# Patient Record
Sex: Female | Born: 1955 | Race: White | Hispanic: No | Marital: Married | Smoking: Former smoker
Health system: Southern US, Community
[De-identification: ages and names within clinical notes are randomized; demographics above are authoritative.]

## PROBLEM LIST (undated history)

## (undated) DIAGNOSIS — I2510 Atherosclerotic heart disease of native coronary artery without angina pectoris: Secondary | ICD-10-CM

## (undated) DIAGNOSIS — Z87442 Personal history of urinary calculi: Secondary | ICD-10-CM

## (undated) DIAGNOSIS — M1990 Unspecified osteoarthritis, unspecified site: Secondary | ICD-10-CM

## (undated) DIAGNOSIS — I6529 Occlusion and stenosis of unspecified carotid artery: Secondary | ICD-10-CM

## (undated) DIAGNOSIS — IMO0001 Reserved for inherently not codable concepts without codable children: Secondary | ICD-10-CM

## (undated) DIAGNOSIS — G8929 Other chronic pain: Secondary | ICD-10-CM

## (undated) DIAGNOSIS — F32A Depression, unspecified: Secondary | ICD-10-CM

## (undated) DIAGNOSIS — I10 Essential (primary) hypertension: Secondary | ICD-10-CM

## (undated) DIAGNOSIS — E785 Hyperlipidemia, unspecified: Secondary | ICD-10-CM

## (undated) DIAGNOSIS — M545 Low back pain, unspecified: Secondary | ICD-10-CM

## (undated) DIAGNOSIS — E1165 Type 2 diabetes mellitus with hyperglycemia: Secondary | ICD-10-CM

## (undated) DIAGNOSIS — I499 Cardiac arrhythmia, unspecified: Secondary | ICD-10-CM

## (undated) DIAGNOSIS — E1159 Type 2 diabetes mellitus with other circulatory complications: Secondary | ICD-10-CM

## (undated) DIAGNOSIS — G43109 Migraine with aura, not intractable, without status migrainosus: Secondary | ICD-10-CM

## (undated) DIAGNOSIS — IMO0002 Reserved for concepts with insufficient information to code with codable children: Secondary | ICD-10-CM

## (undated) DIAGNOSIS — G629 Polyneuropathy, unspecified: Secondary | ICD-10-CM

## (undated) DIAGNOSIS — F419 Anxiety disorder, unspecified: Secondary | ICD-10-CM

## (undated) DIAGNOSIS — I219 Acute myocardial infarction, unspecified: Secondary | ICD-10-CM

## (undated) DIAGNOSIS — Z794 Long term (current) use of insulin: Secondary | ICD-10-CM

## (undated) DIAGNOSIS — Z531 Procedure and treatment not carried out because of patient's decision for reasons of belief and group pressure: Secondary | ICD-10-CM

## (undated) DIAGNOSIS — F329 Major depressive disorder, single episode, unspecified: Secondary | ICD-10-CM

## (undated) DIAGNOSIS — J45909 Unspecified asthma, uncomplicated: Secondary | ICD-10-CM

## (undated) DIAGNOSIS — Z9289 Personal history of other medical treatment: Secondary | ICD-10-CM

## (undated) DIAGNOSIS — I639 Cerebral infarction, unspecified: Secondary | ICD-10-CM

## (undated) DIAGNOSIS — H269 Unspecified cataract: Secondary | ICD-10-CM

## (undated) HISTORY — PX: CARPAL TUNNEL RELEASE: SHX101

## (undated) HISTORY — DX: Cerebral infarction, unspecified: I63.9

## (undated) HISTORY — PX: KIDNEY STONE SURGERY: SHX686

## (undated) HISTORY — DX: Reserved for concepts with insufficient information to code with codable children: IMO0002

## (undated) HISTORY — PX: LITHOTRIPSY: SUR834

## (undated) HISTORY — DX: Occlusion and stenosis of unspecified carotid artery: I65.29

## (undated) HISTORY — PX: CAROTID ENDARTERECTOMY: SUR193

## (undated) HISTORY — DX: Personal history of urinary calculi: Z87.442

## (undated) HISTORY — DX: Type 2 diabetes mellitus with other circulatory complications: E11.59

## (undated) HISTORY — DX: Hyperlipidemia, unspecified: E78.5

## (undated) HISTORY — DX: Type 2 diabetes mellitus with hyperglycemia: E11.65

## (undated) HISTORY — DX: Migraine with aura, not intractable, without status migrainosus: G43.109

## (undated) HISTORY — DX: Long term (current) use of insulin: Z79.4

## (undated) HISTORY — DX: Personal history of other medical treatment: Z92.89

---

## 2000-06-25 HISTORY — PX: US ECHOCARDIOGRAPHY: HXRAD669

## 2000-06-25 HISTORY — PX: CORONARY ANGIOPLASTY WITH STENT PLACEMENT: SHX49

## 2002-06-25 HISTORY — PX: CARDIAC CATHETERIZATION: SHX172

## 2003-06-26 HISTORY — PX: ANKLE SURGERY: SHX546

## 2007-11-22 ENCOUNTER — Other Ambulatory Visit: Payer: Self-pay

## 2007-11-23 ENCOUNTER — Inpatient Hospital Stay: Payer: Self-pay | Admitting: Internal Medicine

## 2007-12-03 ENCOUNTER — Ambulatory Visit: Payer: Self-pay | Admitting: Urology

## 2007-12-05 ENCOUNTER — Ambulatory Visit: Payer: Self-pay | Admitting: Urology

## 2008-06-02 ENCOUNTER — Emergency Department: Payer: Self-pay | Admitting: Emergency Medicine

## 2008-06-04 ENCOUNTER — Ambulatory Visit: Payer: Self-pay | Admitting: Internal Medicine

## 2008-06-04 ENCOUNTER — Inpatient Hospital Stay (HOSPITAL_COMMUNITY): Admission: EM | Admit: 2008-06-04 | Discharge: 2008-06-06 | Payer: Self-pay | Admitting: Emergency Medicine

## 2010-06-25 HISTORY — PX: CATARACT EXTRACTION W/ INTRAOCULAR LENS IMPLANT: SHX1309

## 2010-11-07 NOTE — Discharge Summary (Signed)
[name], [name]              ACCOUNT NO.:  [id number]   MEDICAL RECORD NO.:  [id number]          PATIENT TYPE:  INP   LOCATION:  4710                         FACILITY:  [facility]   PHYSICIAN:  Altha Harm, MDDATE OF BIRTH:  07/19/1955   DATE OF ADMISSION:  06/04/2008  DATE OF DISCHARGE:  06/06/2008                               DISCHARGE SUMMARY   DISCHARGE DISPOSITION:  Home.   FINAL DISCHARGE DIAGNOSES:  1. Acute tracheobronchitis.  2. Urinary tract infection.  3. Pharyngitis.  4. Hyperlipidemia.  5. Diabetes type 2, uncontrolled.  6. Hypertension.  7. Chest pain, likely noncardiac.   DISCHARGE MEDICATIONS:  1. Lantus 45 units subcutaneous nightly.  2. Lisinopril 20 mg p.o. daily.  3. NovoLog sliding scale.  4. Mucinex over-the-counter 1 tab q.8 hours p.r.n.  5. Aspirin 325 mg p.o. daily.  6. Metoprolol 12.5 mg p.o. b.i.d.  7. Pravastatin 20 mg p.o. nightly.  8. Ciprofloxacin 500 mg p.o. b.i.d. x4 days.  9. Albuterol MDI 2 puffs p.o. q.2 hours p.r.n.   CONSULTANTS:  None.   PROCEDURES:  None.   DIAGNOSTIC STUDIES:  A 2-view chest x-ray done on admission which shows  no acute cardiopulmonary process.   PERTINENT LABORATORY STUDIES:  1. Rapid strep screen, negative.  2. Urinalysis shows elements consistent with a UTI.  No cultures      since.   CODE STATUS:  Full Code.   ALLERGIES:  No known drug allergies.   PRIMARY CARE PHYSICIAN:  Unassigned in this area.  The patient is from  [place].   CHIEF COMPLAINT:  Shortness of breath.   HISTORY OF PRESENT ILLNESS:  Please refer to the H and P by Dr. Sharon Seller  for details of the HPI.   HOSPITAL COURSE:  The patient is admitted with complaints of shortness  of breath.  She did not show any respiratory process on the chest x-ray.  Cardiology was consulted for shortness of breath and chest pain workup.  They did not feel that this was cardiac in nature.  The patient,  throughout her hospital course,  revealed symptoms of an acute  tracheobronchitis and was started on albuterol inhalers.  The patient  improved during her hospitalization and had no further shortness of  breath and no oxygen requirement.  In the course of her workup, the  patient was found to have a urinary tract infection.  She was started on  IV Rocephin in the hospital.  However, cultures were not sent on the  patient to guide the antibiotic choice.  The patient received Rocephin  x2 days and is being sent home on ciprofloxacin for an additionally 4  days to treat both the tracheobronchitis as well as the urinary tract  infection.  The patient has been afebrile throughout her hospital stay.  In terms of her chest pain and risk factor analysis, the patient was  found to have a markedly elevated cholesterol with her total cholesterol  being 283 and an LDL of 217.  The patient started on pravastatin.  In  terms of her diabetes, the patient had a hemoglobin A1c of 12.3,  which  speaks to very poor control as an outpatient.  The patient has not been  following up on a regular basis for this, and I have encouraged the  patient to get back in to her regular scheduled 3 times per year,  following up with her primary care physician for this.  The patient  resumed on her Lantus and NovoLog at doses specified above.   In terms of her chest pain, although the cardiologist felt it was  noncardiac, they felt that further outpatient evaluation was warranted  and recommended the patient stay on a beta blocker as well as on aspirin  until she has been further evaluated by them.  The patient is being sent  out on metoprolol and aspirin.  The patient's condition today is stable.   PHYSICAL EXAM:  VITAL SIGNS:  Temperature 98.3, heart rate 67, blood  pressure 130/84, respiratory rate 40, O2 saturation 98% on room air.  CBG running 186-260.  LUNGS:  Clear to auscultation.  CARDIOVASCULAR:  She has normal S1, S2.  No murmurs, rubs, or  gallops  noted.  ABDOMEN:  Obese, soft, nontender, nondistended.  No masses or  hepatosplenomegaly.  EXTREMITIES:  No clubbing, erythema, or cyanosis.  GENERAL:  The patient is nontoxic and well-appearing.   FOLLOWUP:  The patient is to follow up with her primary care physician  within 1 week.   DIETARY RESTRICTIONS:  The patient should be on low-cholesterol, low-  sodium diabetic diet.   ACTIVITY RESTRICTIONS:  None.   Total time 1hr.      Altha Harm, MD  Electronically Signed     MAM/MEDQ  D:  06/06/2008  T:  06/06/2008  Job:  [id number]

## 2010-11-07 NOTE — H&P (Signed)
[name], [name] NO.:  [id number]   MEDICAL RECORD NO.:  [id number]          PATIENT TYPE:  INP   LOCATION:  4710                         FACILITY:  [facility]   PHYSICIAN:  Pricilla Riffle, MD, FACCDATE OF BIRTH:  Mar 19, 1956   DATE OF ADMISSION:  06/04/2008  DATE OF DISCHARGE:                              HISTORY & PHYSICAL   PRIMARY CARDIOLOGIST:  Pricilla Riffle, MD, [facility]   PRIMARY CARE PHYSICIAN:  At this time, the patient was previously  followed by [facility] at [facility] in [place], [place].  She  cannot recall the name of the cardiologist.  [name] is a 55 year old  Caucasian female with a reported history of coronary artery disease  status post PCI with placement of a stent in 2002 or 2003.  The patient  is unable to recall what type of stent or where it was placed.  She  states that it was put in a artery on her heart.  Catheterization was  in the setting of abnormal stress test which was a routine stress test  ordered by her primary care physician because of her risk factors for  heart disease.  [name] states she did not have any associated  symptoms prior to the stress test.  [name] also report she had a  followup stress test after her stent was placed that she told was  negative.  She has never followed up with Cardiology since that time.  It has been at least 6 years ago.  She moved here from [place], [place], 2  years ago.  Since being here 2 years ago, she has not followed up with  or established care with a cardiologist or primary care physician.  Ms.  [name] also has a history of dyslipidemia, hypertension, and diabetes.  She reports 6 months ago, she was treated at [facility] for kidney stones and was told that she had a possible tumor on  her kidney that she needed to have a repeat CT scan or workup in 6  months.  She states she has not done this yet either.  She presents to  [facility] Emergency Room today  complaining of fatigue.  Fatigue is so  intense that it is not relieved with rest.  Today, she states she became  so dyspneic and wheezing and coughing that she felt like she could not  take another breath.  In the setting of extreme fatigue, decided she  better get checked and had her husband drive her here to the emergency  room.  She complains of fatigue for months, not relieved with rest.  She  states that she has been in bed for the last week because she has had a  head cold and a chest cold, night sweats, and a nonproductive cough.  She complains of fleeting sharp sensation in her chest at times, not  associated with any of the other symptoms.  When she arrived in the  emergency room, she was immediately given a neb treatment and 4 baby  aspirins.  She states her breathing was much improved  with the nebulizer  treatment.  Now her only complaint is of ongoing fatigue and a  nonproductive cough.  At home, she states compliance with her medicines.  When I asked her how she is obtaining her medicines if she has not been  to a doctor in 2 years, she states she drove back home to [place] 6  months ago to see her family and saw her previous endocrinologist while  she was there, and she states her endocrinologist gave her a new  prescriptions with refills, instructed her to establish care here.  She  states that her dyslipidemia, hypertension, and diabetes are controlled  by her diet, but she weaned herself off her diabetes medications and her  statins.  Her CBGs at home, she reports run between 66 and 120, but for  the last week, she has not been eating, so she has not been taking her  insulin and they have been running in the 200s-300s.   PAST MEDICAL HISTORY:  1. Reported coronary artery disease status post PCI with a      questionable stent, unknown location, unknown type of stent.  This      was reportedly put in 2002 or 2003 in [place], [place].  Under this,      followup stress test per  patient's report was negative.  2. Dyslipidemia:  The patient reports diet controlled, although she      has not been tested.  3. Hypertension:  The patient states compliance with her lisinopril.  4. Medical noncompliance.  5. Diabetes:  The patient states this is diet controlled, although she      states she does take her Lantus and NovoLog.  6. Questionable kidney stone, questionable abnormal finding, a      questionable tumor per patient's report 6 months ago at [facility].   SOCIAL HISTORY:  The patient lives in [place] with her husband.  She is not  employed, and her husband has been laid off.  She has an adult daughter  and several grandchildren.  She denies any tobacco, EtOH, drug, or  herbal medication use.  She tries to follow an ADA heart-healthy diet.  For exercise, she works with horses and walks.   FAMILY HISTORY:  Positive for angina in the mother, father is alive  with multiple medical problems, followed by the [place].  The patient states  he will not elaborate on his medical history.  Siblings, she has a  sister who apparently is a Engineer, civil (consulting) and has some type of cardiac valve  problem and will eventually need surgery, although the patient is not  sure what her diagnosis is.   REVIEW OF SYSTEMS:  Positive for night sweats, chills, headache, sore  throat, shortness of breath, dyspnea on exertion, cough, wheezing,  fleeting sharp chest pain, a band-like sensation around chest,  generalized weakness, aching all over, and GERD symptoms.  All other  systems reviewed and negative.   ALLERGIES:  No known drug allergies.   Current medications include Lantus 45 units nightly, lisinopril 40,  NovoLog sliding scale insulin a.c., Mucinex OTC, and Alka-Seltzer.   PHYSICAL EXAMINATION:  VITAL SIGNS:  Temperature is 98.3, pulse 82,  respirations 18, blood pressure 177/83 and 165/107.  GENERAL:  The patient is generally in no acute distress.  HEENT:   Normocephalic, atraumatic.  Pupils equal, round, and reactive to  light.  NECK:  Supple without lymphadenopathy.  No bruits.  No JVD.  CARDIOVASCULAR:  S1 and S2.  Regular rate and rhythm.  LUNGS:  Currently clear.  Initially, the patient had scattered rhonchi,  improved with neb treatment.  SKIN:  Warm and dry.  ABDOMEN:  Soft, nontender, positive bowel sounds.  Supple.  LOWER EXTREMITIES:  Without clubbing, cyanosis, or edema.  NEUROLOGIC:  Alert and oriented x3 with a flat affect.   Chest x-ray showed no acute findings.  EKG, sinus rhythm at a rate of 69  without acute ST- or T-wave changes.   LABORATORY WORK:  The only lab work back is her chemistry showing a  sodium of 138, potassium 3.1, chloride 101, CO2 21, BUN 21, creatinine  1.3, a glucose of 267.   IMPRESSION:  1. Dyspnea, body aches with nonproductive cough, symptoms relieved      with nebulizer treatment and aspirin here in the emergency room.  2. Reported history of coronary artery disease with a previous      percutaneous coronary intervention, records not available at this      time.  EKG showing no acute findings.  Cardiac enzymes, first point      of care is negative.  3. Diabetes, poorly controlled per glucose level 267.  4. Hypertension, poorly controlled.  5. Dyslipidemia, status unknown.   Dr. Dietrich Pates has been into examine and assessed the patient.   CURRENT SUBJECTIVE AND OBJECTIVE FINDINGS:  Negative for acute cardiac  issues, most likely respiratory/viral.  We will follow along.  Check  cardiac enzymes and D-dimer.  The patient will need cardiac followup as  an outpatient.  This can be established at the time of discharge.  We  will go ahead and order some fasting lipids, hemoglobin A1c, and cardiac  markers on the patient, as well as supplement her potassium of 3.1.  The  patient will need education in regards to her poorly controlled cardiac  risk factors.  Also, we will add a beta-blocker for her  improved blood  pressure control in the setting of known coronary artery disease.      Dorian Pod, ACNP      Pricilla Riffle, MD, [facility]  Electronically Signed    MB/MEDQ  D:  06/04/2008  T:  06/05/2008  Job:  [id number]

## 2011-03-30 LAB — POCT CARDIAC MARKERS
Myoglobin, poc: 112 ng/mL (ref 12–200)
Myoglobin, poc: 168 ng/mL (ref 12–200)
Troponin i, poc: <0.05 ng/mL (ref 0.00–0.09)

## 2011-03-30 LAB — COMPREHENSIVE METABOLIC PANEL
ALT: 15 U/L (ref 0–35)
ALT: 16 U/L (ref 0–35)
AST: 21 U/L (ref 0–37)
Albumin: 3.2 g/dL — ABNORMAL LOW (ref 3.5–5.2)
Alkaline Phosphatase: 101 U/L (ref 39–117)
BUN: 14 mg/dL (ref 6–23)
CO2: 27 mEq/L (ref 19–32)
Chloride: 95 mEq/L — ABNORMAL LOW (ref 96–112)
Chloride: 97 mEq/L (ref 96–112)
Creatinine, Ser: 1.12 mg/dL (ref 0.4–1.2)
GFR calc Af Amer: >60 mL/min (ref >60)
GFR calc non Af Amer: 51 mL/min — ABNORMAL LOW (ref >60)
Glucose, Bld: 234 mg/dL — ABNORMAL HIGH (ref 70–99)
Potassium: 3.7 mEq/L (ref 3.5–5.1)
Total Bilirubin: 0.7 mg/dL (ref 0.3–1.2)
Total Bilirubin: 0.9 mg/dL (ref 0.3–1.2)

## 2011-03-30 LAB — BLOOD GAS, ARTERIAL
Bicarbonate: 24.5 mEq/L — ABNORMAL HIGH (ref 20.0–24.0)
FIO2: 0.21 %
O2 Saturation: 93.2 %
Patient temperature: 98.4

## 2011-03-30 LAB — CBC
HCT: 36.7 % (ref 36.0–46.0)
Hemoglobin: 12.5 g/dL (ref 12.0–15.0)
Platelets: 425 10*3/uL — ABNORMAL HIGH (ref 150–400)
RBC: 4.76 MIL/uL (ref 3.87–5.11)
WBC: 11.2 10*3/uL — ABNORMAL HIGH (ref 4.0–10.5)
WBC: 11.9 10*3/uL — ABNORMAL HIGH (ref 4.0–10.5)

## 2011-03-30 LAB — URINALYSIS, ROUTINE W REFLEX MICROSCOPIC
Bilirubin Urine: NEGATIVE
Hgb urine dipstick: NEGATIVE
Ketones, ur: >80 mg/dL — AB
Protein, ur: 30 mg/dL — AB
Urobilinogen, UA: 1 mg/dL (ref 0.0–1.0)

## 2011-03-30 LAB — DIFFERENTIAL
Basophils Absolute: 0 10*3/uL (ref 0.0–0.1)
Basophils Relative: 0 % (ref 0–1)
Eosinophils Absolute: 0.2 10*3/uL (ref 0.0–0.7)
Eosinophils Relative: 2 % (ref 0–5)
Monocytes Absolute: 0.8 10*3/uL (ref 0.1–1.0)
Neutrophils Relative %: 66 % (ref 43–77)

## 2011-03-30 LAB — RAPID STREP SCREEN (MED CTR MEBANE ONLY): Streptococcus, Group A Screen (Direct): NEGATIVE

## 2011-03-30 LAB — CARDIAC PANEL(CRET KIN+CKTOT+MB+TROPI)
CK, MB: 1.4 ng/mL (ref 0.3–4.0)
Relative Index: INVALID (ref 0.0–2.5)
Relative Index: INVALID (ref 0.0–2.5)
Troponin I: 0.06 ng/mL (ref 0.00–0.06)
Troponin I: <0.01 ng/mL (ref 0.00–0.06)

## 2011-03-30 LAB — MAGNESIUM: Magnesium: 1.7 mg/dL (ref 1.5–2.5)

## 2011-03-30 LAB — HEMOGLOBIN A1C: Hgb A1c MFr Bld: 12.3 % — ABNORMAL HIGH (ref 4.6–6.1)

## 2011-03-30 LAB — PHOSPHORUS: Phosphorus: 2.1 mg/dL — ABNORMAL LOW (ref 2.3–4.6)

## 2011-03-30 LAB — TSH: TSH: 2.377 u[IU]/mL (ref 0.350–4.500)

## 2011-03-30 LAB — B-NATRIURETIC PEPTIDE (CONVERTED LAB): Pro B Natriuretic peptide (BNP): 109 pg/mL — ABNORMAL HIGH (ref 0.0–100.0)

## 2011-03-30 LAB — POCT I-STAT, CHEM 8
BUN: 21 mg/dL (ref 6–23)
Calcium, Ion: 1.02 mmol/L — ABNORMAL LOW (ref 1.12–1.32)
HCT: 40 % (ref 36.0–46.0)
Hemoglobin: 13.6 g/dL (ref 12.0–15.0)
Sodium: 138 mEq/L (ref 135–145)
TCO2: 30 mmol/L (ref 0–100)

## 2011-03-30 LAB — GLUCOSE, CAPILLARY
Glucose-Capillary: 164 mg/dL — ABNORMAL HIGH (ref 70–99)
Glucose-Capillary: 186 mg/dL — ABNORMAL HIGH (ref 70–99)
Glucose-Capillary: 216 mg/dL — ABNORMAL HIGH (ref 70–99)
Glucose-Capillary: 242 mg/dL — ABNORMAL HIGH (ref 70–99)

## 2011-03-30 LAB — VITAMIN B12: Vitamin B-12: 1958 pg/mL — ABNORMAL HIGH (ref 211–911)

## 2011-03-30 LAB — AMMONIA: Ammonia: <8 umol/L — ABNORMAL LOW (ref 11–35)

## 2011-03-30 LAB — CORTISOL-AM, BLOOD: Cortisol - AM: 13 ug/dL (ref 4.3–22.4)

## 2011-06-26 HISTORY — PX: OTHER SURGICAL HISTORY: SHX169

## 2011-12-12 ENCOUNTER — Inpatient Hospital Stay (HOSPITAL_COMMUNITY)
Admission: EM | Admit: 2011-12-12 | Discharge: 2011-12-14 | DRG: 065 | Disposition: A | Discharge status: Home / Self Care | Payer: MEDICAID | Attending: Internal Medicine | Admitting: Internal Medicine

## 2011-12-12 ENCOUNTER — Ambulatory Visit (HOSPITAL_COMMUNITY): Payer: Self-pay

## 2011-12-12 ENCOUNTER — Inpatient Hospital Stay (HOSPITAL_COMMUNITY): Payer: Self-pay

## 2011-12-12 ENCOUNTER — Encounter (HOSPITAL_COMMUNITY): Payer: Self-pay | Admitting: Emergency Medicine

## 2011-12-12 ENCOUNTER — Emergency Department (HOSPITAL_COMMUNITY): Payer: Self-pay

## 2011-12-12 DIAGNOSIS — Z9119 Patient's noncompliance with other medical treatment and regimen: Secondary | ICD-10-CM

## 2011-12-12 DIAGNOSIS — Z794 Long term (current) use of insulin: Secondary | ICD-10-CM

## 2011-12-12 DIAGNOSIS — R4182 Altered mental status, unspecified: Secondary | ICD-10-CM

## 2011-12-12 DIAGNOSIS — Z91199 Patient's noncompliance with other medical treatment and regimen due to unspecified reason: Secondary | ICD-10-CM

## 2011-12-12 DIAGNOSIS — I10 Essential (primary) hypertension: Secondary | ICD-10-CM

## 2011-12-12 DIAGNOSIS — I6529 Occlusion and stenosis of unspecified carotid artery: Secondary | ICD-10-CM | POA: Diagnosis present

## 2011-12-12 DIAGNOSIS — G454 Transient global amnesia: Secondary | ICD-10-CM

## 2011-12-12 DIAGNOSIS — F411 Generalized anxiety disorder: Secondary | ICD-10-CM | POA: Diagnosis present

## 2011-12-12 DIAGNOSIS — I6340 Cerebral infarction due to embolism of unspecified cerebral artery: Secondary | ICD-10-CM

## 2011-12-12 DIAGNOSIS — D649 Anemia, unspecified: Secondary | ICD-10-CM | POA: Diagnosis present

## 2011-12-12 DIAGNOSIS — I2510 Atherosclerotic heart disease of native coronary artery without angina pectoris: Secondary | ICD-10-CM

## 2011-12-12 DIAGNOSIS — I639 Cerebral infarction, unspecified: Secondary | ICD-10-CM | POA: Diagnosis present

## 2011-12-12 DIAGNOSIS — IMO0001 Reserved for inherently not codable concepts without codable children: Secondary | ICD-10-CM | POA: Diagnosis present

## 2011-12-12 DIAGNOSIS — I6350 Cerebral infarction due to unspecified occlusion or stenosis of unspecified cerebral artery: Principal | ICD-10-CM | POA: Diagnosis present

## 2011-12-12 DIAGNOSIS — E785 Hyperlipidemia, unspecified: Secondary | ICD-10-CM | POA: Diagnosis present

## 2011-12-12 DIAGNOSIS — I674 Hypertensive encephalopathy: Secondary | ICD-10-CM

## 2011-12-12 DIAGNOSIS — Z880 Allergy status to penicillin: Secondary | ICD-10-CM

## 2011-12-12 DIAGNOSIS — E1165 Type 2 diabetes mellitus with hyperglycemia: Secondary | ICD-10-CM

## 2011-12-12 DIAGNOSIS — Z87891 Personal history of nicotine dependence: Secondary | ICD-10-CM

## 2011-12-12 DIAGNOSIS — I658 Occlusion and stenosis of other precerebral arteries: Secondary | ICD-10-CM | POA: Diagnosis present

## 2011-12-12 DIAGNOSIS — F40298 Other specified phobia: Secondary | ICD-10-CM | POA: Diagnosis present

## 2011-12-12 DIAGNOSIS — I6789 Other cerebrovascular disease: Secondary | ICD-10-CM

## 2011-12-12 DIAGNOSIS — E119 Type 2 diabetes mellitus without complications: Secondary | ICD-10-CM

## 2011-12-12 HISTORY — DX: Anxiety disorder, unspecified: F41.9

## 2011-12-12 HISTORY — DX: Atherosclerotic heart disease of native coronary artery without angina pectoris: I25.10

## 2011-12-12 HISTORY — DX: Reserved for inherently not codable concepts without codable children: IMO0001

## 2011-12-12 HISTORY — DX: Other chronic pain: G89.29

## 2011-12-12 HISTORY — DX: Procedure and treatment not carried out because of patient's decision for reasons of belief and group pressure: Z53.1

## 2011-12-12 HISTORY — DX: Essential (primary) hypertension: I10

## 2011-12-12 HISTORY — DX: Low back pain, unspecified: M54.50

## 2011-12-12 HISTORY — DX: Low back pain: M54.5

## 2011-12-12 HISTORY — DX: Unspecified osteoarthritis, unspecified site: M19.90

## 2011-12-12 HISTORY — DX: Unspecified asthma, uncomplicated: J45.909

## 2011-12-12 LAB — DIFFERENTIAL
Basophils Relative: 0 % (ref 0–1)
Eosinophils Absolute: 0.1 10*3/uL (ref 0.0–0.7)
Eosinophils Relative: 2 % (ref 0–5)
Lymphs Abs: 2 10*3/uL (ref 0.7–4.0)
Monocytes Relative: 6 % (ref 3–12)

## 2011-12-12 LAB — URINALYSIS, ROUTINE W REFLEX MICROSCOPIC
Ketones, ur: NEGATIVE mg/dL
Leukocytes, UA: NEGATIVE
Nitrite: NEGATIVE
Protein, ur: 30 mg/dL — AB
Urobilinogen, UA: 0.2 mg/dL (ref 0.0–1.0)
pH: 7 (ref 5.0–8.0)

## 2011-12-12 LAB — CBC
HCT: 33.8 % — ABNORMAL LOW (ref 36.0–46.0)
MCH: 28.4 pg (ref 26.0–34.0)
MCH: 28.2 pg (ref 26.0–34.0)
MCHC: 34.4 g/dL (ref 30.0–36.0)
MCHC: 34.3 g/dL (ref 30.0–36.0)
MCV: 82.7 fL (ref 78.0–100.0)
MCV: 82.2 fL (ref 78.0–100.0)
Platelets: 403 10*3/uL — ABNORMAL HIGH (ref 150–400)
Platelets: 369 10*3/uL (ref 150–400)
RBC: 4.33 MIL/uL (ref 3.87–5.11)
RDW: 12.3 % (ref 11.5–15.5)

## 2011-12-12 LAB — URINE MICROSCOPIC-ADD ON

## 2011-12-12 LAB — GLUCOSE, CAPILLARY
Glucose-Capillary: 270 mg/dL — ABNORMAL HIGH (ref 70–99)
Glucose-Capillary: 286 mg/dL — ABNORMAL HIGH (ref 70–99)

## 2011-12-12 LAB — POCT I-STAT, CHEM 8
Calcium, Ion: 1.22 mmol/L (ref 1.12–1.32)
HCT: 40 % (ref 36.0–46.0)
Hemoglobin: 13.6 g/dL (ref 12.0–15.0)
TCO2: 25 mmol/L (ref 0–100)

## 2011-12-12 LAB — RAPID URINE DRUG SCREEN, HOSP PERFORMED
Amphetamines: NOT DETECTED
Tetrahydrocannabinol: NOT DETECTED

## 2011-12-12 MED ORDER — LORAZEPAM 2 MG/ML IJ SOLN
INTRAMUSCULAR | Status: AC
  Administered 2011-12-12: 1 mg via INTRAVENOUS
  Filled 2011-12-12: qty 1

## 2011-12-12 MED ORDER — LORAZEPAM 2 MG/ML IJ SOLN
1.0000 mg | Freq: Once | INTRAMUSCULAR | Status: AC
  Administered 2011-12-12: 1 mg via INTRAVENOUS

## 2011-12-12 MED ORDER — ONDANSETRON HCL 4 MG/2ML IJ SOLN: 4.0000 mg | Freq: Four times a day (QID) | INTRAMUSCULAR | Status: DC | PRN

## 2011-12-12 MED ORDER — LABETALOL HCL 5 MG/ML IV SOLN
20.0000 mg | Freq: Once | INTRAVENOUS | Status: AC
  Administered 2011-12-12: 20 mg via INTRAVENOUS
  Filled 2011-12-12: qty 4

## 2011-12-12 MED ORDER — HYDROCHLOROTHIAZIDE 12.5 MG PO CAPS
12.5000 mg | ORAL_CAPSULE | Freq: Every day | ORAL | Status: DC
  Administered 2011-12-12: 12.5 mg via ORAL
  Filled 2011-12-12: qty 1

## 2011-12-12 MED ORDER — LISINOPRIL 5 MG PO TABS
5.0000 mg | ORAL_TABLET | Freq: Every day | ORAL | Status: DC
  Administered 2011-12-12 – 2011-12-14 (×3): 5 mg via ORAL
  Filled 2011-12-12 (×3): qty 1

## 2011-12-12 MED ORDER — SODIUM CHLORIDE 0.9 % IJ SOLN
3.0000 mL | Freq: Two times a day (BID) | INTRAMUSCULAR | Status: DC
  Administered 2011-12-12 – 2011-12-14 (×4): 3 mL via INTRAVENOUS

## 2011-12-12 MED ORDER — ENOXAPARIN SODIUM 40 MG/0.4ML ~~LOC~~ SOLN
40.0000 mg | SUBCUTANEOUS | Status: DC
  Administered 2011-12-12 – 2011-12-14 (×3): 40 mg via SUBCUTANEOUS
  Filled 2011-12-12 (×3): qty 0.4

## 2011-12-12 MED ORDER — ACETAMINOPHEN 325 MG PO TABS
650.0000 mg | ORAL_TABLET | Freq: Once | ORAL | Status: AC
  Administered 2011-12-12: 650 mg via ORAL

## 2011-12-12 MED ORDER — ACETAMINOPHEN 650 MG RE SUPP: 650.0000 mg | Freq: Four times a day (QID) | RECTAL | Status: DC | PRN

## 2011-12-12 MED ORDER — LABETALOL HCL 5 MG/ML IV SOLN: 10.0000 mg | INTRAVENOUS | Status: DC | PRN

## 2011-12-12 MED ORDER — INSULIN ASPART 100 UNIT/ML ~~LOC~~ SOLN
0.0000 [IU] | Freq: Three times a day (TID) | SUBCUTANEOUS | Status: DC
  Administered 2011-12-12: 5 [IU] via SUBCUTANEOUS
  Administered 2011-12-13: 7 [IU] via SUBCUTANEOUS
  Administered 2011-12-13: 5 [IU] via SUBCUTANEOUS

## 2011-12-12 MED ORDER — SODIUM CHLORIDE 0.9 % IJ SOLN: 3.0000 mL | INTRAMUSCULAR | Status: DC | PRN

## 2011-12-12 MED ORDER — SODIUM CHLORIDE 0.9 % IJ SOLN
3.0000 mL | Freq: Two times a day (BID) | INTRAMUSCULAR | Status: DC
  Administered 2011-12-13: 3 mL via INTRAVENOUS

## 2011-12-12 MED ORDER — SODIUM CHLORIDE 0.9 % IV SOLN: 250.0000 mL | INTRAVENOUS | Status: DC | PRN

## 2011-12-12 MED ORDER — ONDANSETRON HCL 4 MG PO TABS: 4.0000 mg | ORAL_TABLET | Freq: Four times a day (QID) | ORAL | Status: DC | PRN

## 2011-12-12 MED ORDER — SIMVASTATIN 10 MG PO TABS
10.0000 mg | ORAL_TABLET | Freq: Every day | ORAL | Status: DC
  Administered 2011-12-12 – 2011-12-14 (×3): 10 mg via ORAL
  Filled 2011-12-12 (×3): qty 1

## 2011-12-12 MED ORDER — SENNA 8.6 MG PO TABS
1.0000 | ORAL_TABLET | Freq: Two times a day (BID) | ORAL | Status: DC
  Administered 2011-12-12 – 2011-12-13 (×3): 8.6 mg via ORAL
  Filled 2011-12-12 (×5): qty 1

## 2011-12-12 MED ORDER — ACETAMINOPHEN 325 MG PO TABS
650.0000 mg | ORAL_TABLET | Freq: Four times a day (QID) | ORAL | Status: DC | PRN
  Administered 2011-12-13: 650 mg via ORAL
  Filled 2011-12-12: qty 2

## 2011-12-12 MED ORDER — ACETAMINOPHEN 325 MG PO TABS
ORAL_TABLET | ORAL | Status: AC
  Administered 2011-12-12: 650 mg via ORAL
  Filled 2011-12-12: qty 2

## 2011-12-12 MED ORDER — HYDROCODONE-ACETAMINOPHEN 5-325 MG PO TABS: 1.0000 | ORAL_TABLET | ORAL | Status: DC | PRN

## 2011-12-12 MED ORDER — LABETALOL HCL 5 MG/ML IV SOLN
2.0000 mg/min | INTRAVENOUS | Status: DC
  Administered 2011-12-12: 1 mg/min via INTRAVENOUS
  Filled 2011-12-12: qty 100

## 2011-12-12 MED ORDER — ASPIRIN 325 MG PO TABS
325.0000 mg | ORAL_TABLET | Freq: Every day | ORAL | Status: DC
  Administered 2011-12-12 – 2011-12-14 (×3): 325 mg via ORAL
  Filled 2011-12-12 (×3): qty 1

## 2011-12-12 MED ORDER — GADOBENATE DIMEGLUMINE 529 MG/ML IV SOLN
15.0000 mL | Freq: Once | INTRAVENOUS | Status: AC | PRN
  Administered 2011-12-12: 15 mL via INTRAVENOUS

## 2011-12-12 MED ORDER — SODIUM CHLORIDE 0.9 % IV SOLN
INTRAVENOUS | Status: DC
  Administered 2011-12-12: 09:00:00 via INTRAVENOUS

## 2011-12-12 MED ORDER — SODIUM CHLORIDE 0.9 % IV SOLN: INTRAVENOUS | Status: DC

## 2011-12-12 NOTE — ED Notes (Signed)
Pt sts she is claustrophobic; ativan ordered and given per EDP in MRI

## 2011-12-12 NOTE — Consult Note (Signed)
[facility] CONSULT [facility]     Reason for Consult: Transient confusion with short-term memory difficulty.   HPI:    [name] is an 56 y.o. female history of diabetes mellitus, coronary artery disease, hypertension and hyperlipidemia, presenting with a history of confusion with fairly striking short-term memory difficulty. Patient asked same questions over and over despite being given the correct answers by family members. Symptoms lasted at least several hours. There was no focal weakness. Speech was not slurred. There is no previous history of similar symptoms. Patient has no memory for any events this morning and early afternoon today. She has not been on antiplatelet therapy. CT scan showed no acute intracranial abnormality. MRI showed multiple small areas of restricted diffusion suggestive of acute infarctions of possible proximal embolic source since multiple vascular territories were involved. MRA of the head showed no significant stenotic or occlusive lesion. MRA of the neck showed high-grade right internal carotid artery stenosis as well as moderate stenosis at the origin of the right external carotid artery.  Past Medical History  Diagnosis Date  . CAD (coronary artery disease) 12/12/2011  . Hypertension, accelerated 12/12/2011  . Refusal of blood transfusions as patient is Jehov[employer]'s Witness   . Complication of anesthesia     "they have trouble waking me up"  . High cholesterol   . Anginal pain   . History of bronchitis   . Asthma 2006    "was hospitalized for this in [place]"  . Type II diabetes mellitus     "some say I'm type I"  . Headache   . Migraines   . Kidney stones     "several times"  . Arthritis   . Chronic lower back pain   . Claustrophobia   . Anxiety     Past Surgical History  Procedure Date  . Kidney stone surgery   . Neuroplasty / transposition median nerve at carpal tunnel bilateral ~ 2006  . Coronary angioplasty with  stent placement ~ 2004    "2"  . Breast biopsy     left  . Cataract extraction w/ intraocular lens implant 2012    left    History reviewed. No pertinent family history.  Social History:  reports that she quit smoking about 36 years ago. Her smoking use included Cigarettes. She has a 3 pack-year smoking history. She has never used smokeless tobacco. She reports that she drinks alcohol. She reports that she does not use illicit drugs.  Allergies  Allergen Reactions  . Penicillins Itching and Other (See Comments)    From when younger    Medications:    Scheduled:   . acetaminophen  650 mg Oral Once  . aspirin  325 mg Oral Daily  . enoxaparin  40 mg Subcutaneous Q24H  . insulin aspart  0-9 Units Subcutaneous TID WC  . labetalol  20 mg Intravenous Once  . lisinopril  5 mg Oral Daily  . LORazepam  1 mg Intravenous Once  . senna  1 tablet Oral BID  . simvastatin  10 mg Oral q1800  . sodium chloride  3 mL Intravenous Q12H  . sodium chloride  3 mL Intravenous Q12H  . DISCONTD: sodium chloride   Intravenous STAT  . DISCONTD: hydrochlorothiazide  12.5 mg Oral Daily   JXB:JYNWGN chloride, acetaminophen, acetaminophen, gadobenate dimeglumine, HYDROcodone-acetaminophen, labetalol, ondansetron (ZOFRAN) IV, ondansetron, sodium chloride  Blood pressure 180/94, pulse 78, temperature 98.5 F (36.9 C), temperature source Oral, resp. rate 18, SpO2 95.00%.  Neurologic Examination:  Mental Status: Slightly groggy from sedation earlier for MRI study; oriented, thought content appropriate.  Speech fluent without evidence of aphasia. Able to follow commands without difficulty. Cranial Nerves: II-Visual fields were normal. III/IV/VI-Pupils were equal and reacted. Extraocular movements were full and conjugate.    V/VII-no facial numbness and no facial weakness. VIII-normal. X-normal speech and symmetrical palatal movement. Motor: 5/5 bilaterally with normal tone and bulk Sensory: Normal  throughout. Deep Tendon Reflexes: 2+ and symmetric. Plantars: Flexor bilaterally Cerebellar: Normal finger-to-nose testing. Carotid auscultation: Strong right carotid bruit.   Lab Results  Component Value Date/Time   CHOL  Value: 283        ATP III CLASSIFICATION:  <200     mg/dL   Desirable  098-119  mg/dL   Borderline High  >=147    mg/dL   High* 82/95/6213  0:86 AM    Results for orders placed during the hospital encounter of 12/12/11 (from the past 48 hour(s))  GLUCOSE, CAPILLARY     Status: Abnormal   Collection Time   12/12/11  8:54 AM      Component Value Range Comment   Glucose-Capillary 256 (*) 70 - 99 mg/dL   POCT I-STAT, CHEM 8     Status: Abnormal   Collection Time   12/12/11  9:32 AM      Component Value Range Comment   Sodium 146 (*) 135 - 145 mEq/L    Potassium 4.4  3.5 - 5.1 mEq/L    Chloride 103  96 - 112 mEq/L    BUN 13  6 - 23 mg/dL    Creatinine, Ser 5.78  0.50 - 1.10 mg/dL    Glucose, Bld 469 (*) 70 - 99 mg/dL    Calcium, Ion 6.29  5.28 - 1.32 mmol/L    TCO2 25  0 - 100 mmol/L    Hemoglobin 13.6  12.0 - 15.0 g/dL    HCT 41.3  24.4 - 01.0 %   URINALYSIS, ROUTINE W REFLEX MICROSCOPIC     Status: Abnormal   Collection Time   12/12/11  9:36 AM      Component Value Range Comment   Color, Urine YELLOW  YELLOW    APPearance CLEAR  CLEAR    Specific Gravity, Urine 1.012  1.005 - 1.030    pH 7.0  5.0 - 8.0    Glucose, UA 500 (*) NEGATIVE mg/dL    Hgb urine dipstick NEGATIVE  NEGATIVE    Bilirubin Urine NEGATIVE  NEGATIVE    Ketones, ur NEGATIVE  NEGATIVE mg/dL    Protein, ur 30 (*) NEGATIVE mg/dL    Urobilinogen, UA 0.2  0.0 - 1.0 mg/dL    Nitrite NEGATIVE  NEGATIVE    Leukocytes, UA NEGATIVE  NEGATIVE   URINE MICROSCOPIC-ADD ON     Status: Normal   Collection Time   12/12/11  9:36 AM      Component Value Range Comment   Squamous Epithelial / LPF RARE  RARE   URINE RAPID DRUG SCREEN (HOSP PERFORMED)     Status: Normal   Collection Time   12/12/11  9:36 AM       Component Value Range Comment   Opiates NONE DETECTED  NONE DETECTED    Cocaine NONE DETECTED  NONE DETECTED    Benzodiazepines NONE DETECTED  NONE DETECTED    Amphetamines NONE DETECTED  NONE  DETECTED    Tetrahydrocannabinol NONE DETECTED  NONE DETECTED    Barbiturates NONE DETECTED  NONE DETECTED   CBC     Status: Abnormal   Collection Time   12/12/11 11:27 AM      Component Value Range Comment   WBC 7.6  4.0 - 10.5 K/uL    RBC 4.33  3.87 - 5.11 MIL/uL    Hemoglobin 12.3  12.0 - 15.0 g/dL    HCT 16.1 (*) 09.6 - 46.0 %    MCV 82.7  78.0 - 100.0 fL    MCH 28.4  26.0 - 34.0 pg    MCHC 34.4  30.0 - 36.0 g/dL    RDW 04.5  40.9 - 81.1 %    Platelets 403 (*) 150 - 400 K/uL   DIFFERENTIAL     Status: Normal   Collection Time   12/12/11 11:27 AM      Component Value Range Comment   Neutrophils Relative 67  43 - 77 %    Neutro Abs 5.1  1.7 - 7.7 K/uL    Lymphocytes Relative 26  12 - 46 %    Lymphs Abs 2.0  0.7 - 4.0 K/uL    Monocytes Relative 6  3 - 12 %    Monocytes Absolute 0.4  0.1 - 1.0 K/uL    Eosinophils Relative 2  0 - 5 %    Eosinophils Absolute 0.1  0.0 - 0.7 K/uL    Basophils Relative 0  0 - 1 %    Basophils Absolute 0.0  0.0 - 0.1 K/uL   GLUCOSE, CAPILLARY     Status: Abnormal   Collection Time   12/12/11  4:30 PM      Component Value Range Comment   Glucose-Capillary 270 (*) 70 - 99 mg/dL   CBC     Status: Abnormal   Collection Time   12/12/11  4:46 PM      Component Value Range Comment   WBC 6.6  4.0 - 10.5 K/uL    RBC 4.11  3.87 - 5.11 MIL/uL    Hemoglobin 11.6 (*) 12.0 - 15.0 g/dL    HCT 91.4 (*) 78.2 - 46.0 %    MCV 82.2  78.0 - 100.0 fL    MCH 28.2  26.0 - 34.0 pg    MCHC 34.3  30.0 - 36.0 g/dL    RDW 95.6  21.3 - 08.6 %    Platelets 369  150 - 400 K/uL   CREATININE, SERUM     Status: Normal   Collection Time   12/12/11  4:46 PM      Component Value Range Comment   Creatinine, Ser 0.77  0.50 - 1.10 mg/dL    GFR calc non Af Amer >90  >90 mL/min     GFR calc Af Amer >90  >90 mL/min     Dg Chest 2 View  12/12/2011  *RADIOLOGY REPORT*  Clinical Data: Altered mental status, weakness, hypertension  CHEST - 2 VIEW  Comparison: 06/04/2008  Findings: Normal heart size, mediastinal contours, and pulmonary vascularity. Minimal scarring right middle lobe. Minimal bronchitic changes. Lungs otherwise clear. No pleural effusion or pneumothorax. No acute osseous findings.  IMPRESSION: Minimal bronchitic changes and right middle lobe scarring.  Original Report Authenticated By: Lollie Marrow, M.D.   Ct Head Wo Contrast  12/12/2011  *RADIOLOGY REPORT*  Clinical Data: Altered mental status, confusion  CT HEAD WITHOUT CONTRAST  Technique:  Contiguous axial images were obtained from the  base of the skull through the vertex without contrast.  Comparison: None  Findings: Normal ventricular morphology. No midline shift or mass effect. Scattered areas of white matter hypoattenuation question small vessel chronic ischemic change. No intracranial hemorrhage, mass lesion evidence of acute infarction. No extra-axial fluid collections. Atherosclerotic calcification of internal carotid arteries bilaterally at skull base. Visualized paranasal sinuses and mastoid air cells clear. No acute calvarial abnormalities.  IMPRESSION: Question small vessel chronic ischemic changes of deep cerebral white matter. No acute intracranial abnormalities.  Original Report Authenticated By: Lollie Marrow, M.D.   Mr Maxine Glenn Head Wo Contrast  12/12/2011  *RADIOLOGY REPORT*  Clinical Data:  Altered mental status.  Hypertension and diabetes.  MRI HEAD WITHOUT AND WITH CONTRAST MRA HEAD WITHOUT CONTRAST MRA NECK WITHOUT AND WITH CONTRAST  Technique:  Multiplanar, multiecho pulse sequences of the brain and surrounding structures were obtained without and with intravenous contrast.  Angiographic images of the Circle of Willis were obtained using MRA technique without intravenous contrast. Angiographic images  of the neck were obtained using MRA technique without and with intravenous contrast.  Carotid stenosis measurements (when applicable) are obtained utilizing NASCET criteria, using the distal internal carotid diameter as the denominator.  Contrast: 15mL MULTIHANCE GADOBENATE DIMEGLUMINE 529 MG/ML IV SOLN  Comparison:  CT 12/12/2011  MRI HEAD  Findings:  Scattered small areas of restricted diffusion are present bilaterally, suggestive of acute infarction.  There are small lesions in the right cerebellum, right temporal parietal lobe, right caudate, and left frontal lobe.  There is a small lesion in the right occipital lobe.  These do not show enhancement or mass effect.  Small nonenhancing white matter lesions are present bilaterally, compatible with chronic microvascular ischemia.  Brainstem is normal.  No hemorrhage or mass lesion is seen.  Postcontrast imaging of  the brain reveals normal enhancement.  IMPRESSION: Multiple small areas of restricted diffusion suggestive of acute infarct.  If these are  infarction, they are likely embolic as they involve multiple vascular distributions.  Normal enhancement pattern.  No mass lesion.  MRA HEAD  Findings: Both vertebral arteries are patent to the basilar.  Mild to moderate stenosis distal left vertebral artery.  Basilar is widely patent.  Left AICA is patent and probably diseased. Superior cerebellar artery and posterior cerebral arteries are patent bilaterally.  Moderate stenosis in the left posterior cerebral artery.  Mild narrowing of the cavernous carotid on the right, likely due to atherosclerotic disease.  Hypoplastic right A1 segment.  Anterior middle cerebral arteries are patent bilaterally without significant stenosis.  Negative for cerebral aneurysm.  IMPRESSION: Probable atherosclerotic disease involving the left distal vertebral artery and left posterior cerebral artery.  No large vessel occlusion.  MRA NECK  Findings: Standard branching of the aortic arch  without proximal stenosis.  Right carotid:  Right common carotid artery is patent with mild irregularity and stenosis distally.  There is a critical stenosis of the proximal right internal carotid artery with a segmental area of signal loss.  There is also a moderate to severe stenosis at the origin of the right external carotid artery.  Left carotid:  Mild atherosclerotic irregularity of the carotid bifurcation.  There is plaque in the carotid bulb with approximately 40% diameter stenosis of the left internal carotid artery.  Vertebral arteries:  Both vertebral arteries are widely patent to the basilar without significant stenosis.  IMPRESSION: Critical stenosis proximal right internal carotid artery, estimated to be greater than 85% diameter stenosis.  There is also  a moderate to severe stenosis of the origin of the right external carotid artery.  Approximately 40% diameter stenosis of the proximal left internal carotid artery.  Original Report Authenticated By: Camelia Phenes, M.D.   Mr Angiogram Neck W Wo Contrast  12/12/2011  *RADIOLOGY REPORT*  Clinical Data:  Altered mental status.  Hypertension and diabetes.  MRI HEAD WITHOUT AND WITH CONTRAST MRA HEAD WITHOUT CONTRAST MRA NECK WITHOUT AND WITH CONTRAST  Technique:  Multiplanar, multiecho pulse sequences of the brain and surrounding structures were obtained without and with intravenous contrast.  Angiographic images of the Circle of Willis were obtained using MRA technique without intravenous contrast. Angiographic images of the neck were obtained using MRA technique without and with intravenous contrast.  Carotid stenosis measurements (when applicable) are obtained utilizing NASCET criteria, using the distal internal carotid diameter as the denominator.  Contrast: 15mL MULTIHANCE GADOBENATE DIMEGLUMINE 529 MG/ML IV SOLN  Comparison:  CT 12/12/2011  MRI HEAD  Findings:  Scattered small areas of restricted diffusion are present bilaterally, suggestive of  acute infarction.  There are small lesions in the right cerebellum, right temporal parietal lobe, right caudate, and left frontal lobe.  There is a small lesion in the right occipital lobe.  These do not show enhancement or mass effect.  Small nonenhancing white matter lesions are present bilaterally, compatible with chronic microvascular ischemia.  Brainstem is normal.  No hemorrhage or mass lesion is seen.  Postcontrast imaging of  the brain reveals normal enhancement.  IMPRESSION: Multiple small areas of restricted diffusion suggestive of acute infarct.  If these are  infarction, they are likely embolic as they involve multiple vascular distributions.  Normal enhancement pattern.  No mass lesion.  MRA HEAD  Findings: Both vertebral arteries are patent to the basilar.  Mild to moderate stenosis distal left vertebral artery.  Basilar is widely patent.  Left AICA is patent and probably diseased. Superior cerebellar artery and posterior cerebral arteries are patent bilaterally.  Moderate stenosis in the left posterior cerebral artery.  Mild narrowing of the cavernous carotid on the right, likely due to atherosclerotic disease.  Hypoplastic right A1 segment.  Anterior middle cerebral arteries are patent bilaterally without significant stenosis.  Negative for cerebral aneurysm.  IMPRESSION: Probable atherosclerotic disease involving the left distal vertebral artery and left posterior cerebral artery.  No large vessel occlusion.  MRA NECK  Findings: Standard branching of the aortic arch without proximal stenosis.  Right carotid:  Right common carotid artery is patent with mild irregularity and stenosis distally.  There is a critical stenosis of the proximal right internal carotid artery with a segmental area of signal loss.  There is also a moderate to severe stenosis at the origin of the right external carotid artery.  Left carotid:  Mild atherosclerotic irregularity of the carotid bifurcation.  There is plaque in the  carotid bulb with approximately 40% diameter stenosis of the left internal carotid artery.  Vertebral arteries:  Both vertebral arteries are widely patent to the basilar without significant stenosis.  IMPRESSION: Critical stenosis proximal right internal carotid artery, estimated to be greater than 85% diameter stenosis.  There is also a moderate to severe stenosis of the origin of the right external carotid artery.  Approximately 40% diameter stenosis of the proximal left internal carotid artery.  Original Report Authenticated By: Camelia Phenes, M.D.   Mr Laqueta Jean Wo Contrast  12/12/2011  *RADIOLOGY REPORT*  Clinical Data:  Altered mental status.  Hypertension and diabetes.  MRI  HEAD WITHOUT AND WITH CONTRAST MRA HEAD WITHOUT CONTRAST MRA NECK WITHOUT AND WITH CONTRAST  Technique:  Multiplanar, multiecho pulse sequences of the brain and surrounding structures were obtained without and with intravenous contrast.  Angiographic images of the Circle of Willis were obtained using MRA technique without intravenous contrast. Angiographic images of the neck were obtained using MRA technique without and with intravenous contrast.  Carotid stenosis measurements (when applicable) are obtained utilizing NASCET criteria, using the distal internal carotid diameter as the denominator.  Contrast: 15mL MULTIHANCE GADOBENATE DIMEGLUMINE 529 MG/ML IV SOLN  Comparison:  CT 12/12/2011  MRI HEAD  Findings:  Scattered small areas of restricted diffusion are present bilaterally, suggestive of acute infarction.  There are small lesions in the right cerebellum, right temporal parietal lobe, right caudate, and left frontal lobe.  There is a small lesion in the right occipital lobe.  These do not show enhancement or mass effect.  Small nonenhancing white matter lesions are present bilaterally, compatible with chronic microvascular ischemia.  Brainstem is normal.  No hemorrhage or mass lesion is seen.  Postcontrast imaging of  the brain  reveals normal enhancement.  IMPRESSION: Multiple small areas of restricted diffusion suggestive of acute infarct.  If these are  infarction, they are likely embolic as they involve multiple vascular distributions.  Normal enhancement pattern.  No mass lesion.  MRA HEAD  Findings: Both vertebral arteries are patent to the basilar.  Mild to moderate stenosis distal left vertebral artery.  Basilar is widely patent.  Left AICA is patent and probably diseased. Superior cerebellar artery and posterior cerebral arteries are patent bilaterally.  Moderate stenosis in the left posterior cerebral artery.  Mild narrowing of the cavernous carotid on the right, likely due to atherosclerotic disease.  Hypoplastic right A1 segment.  Anterior middle cerebral arteries are patent bilaterally without significant stenosis.  Negative for cerebral aneurysm.  IMPRESSION: Probable atherosclerotic disease involving the left distal vertebral artery and left posterior cerebral artery.  No large vessel occlusion.  MRA NECK  Findings: Standard branching of the aortic arch without proximal stenosis.  Right carotid:  Right common carotid artery is patent with mild irregularity and stenosis distally.  There is a critical stenosis of the proximal right internal carotid artery with a segmental area of signal loss.  There is also a moderate to severe stenosis at the origin of the right external carotid artery.  Left carotid:  Mild atherosclerotic irregularity of the carotid bifurcation.  There is plaque in the carotid bulb with approximately 40% diameter stenosis of the left internal carotid artery.  Vertebral arteries:  Both vertebral arteries are widely patent to the basilar without significant stenosis.  IMPRESSION: Critical stenosis proximal right internal carotid artery, estimated to be greater than 85% diameter stenosis.  There is also a moderate to severe stenosis of the origin of the right external carotid artery.  Approximately 40% diameter  stenosis of the proximal left internal carotid artery.  Original Report Authenticated By: Camelia Phenes, M.D.     Assessment/Plan:  69. 56 year old lady presenting with confusion and marked short-term memory difficulty with a pattern consistent with transient global amnesia, which has resolved at this point. 2. 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 3. High-grade right internal carotid stenosis.  Recommendations: 1. Continue aspirin 325 mg per day. 2. Interventional radiology consultation for catheter angiography and possible stenting of right internal carotid artery. 3. 2-D echocardiogram. Patient may need TEE as well. 4. Hemoglobin  A1c and fasting lipid panel 5. Cardiac telemetry to continue.  Venetia Maxon M.D. [facility] [phone]  12/12/2011, 8:32 PM

## 2011-12-12 NOTE — H&P (Signed)
PATIENT DETAILS Name: [name] Age: 56 y.o. Sex: female Date of Birth: 07/24/1955 Admit Date: 12/12/2011 PCP:No primary provider on file.   CHIEF COMPLAINT:  Altered mental status  HPI: Patient is a 56-year-old Caucasian female with a past medical history of hypertension (noncompliant to medications), diabetes, coronary artery disease status post PCI in approximately 2004 presented to the hospital for the above-noted complaints. Please note that this patient is currently amnesic about what exactly happened to her so most of this history is actually obtained from the patient's daughter. Apparently the patient was in her usual state of health, she woke up this morning and called her daughter, she was kept on asking where her daughter is in where her husband is, she apparently was repeating this over and over again. When her daughter went to place this morning, she was ambulating without any difficulty, her speech was clear. As noted above she was repeatedly asking where her husband was (he currently is in [place]-left a few days ago). The patient then brought her to the emergency room for further evaluation and treatment. In the emergency room patient was found to have a significantly elevated blood pressure, she was then started on a labetalol infusion and I was asked to admit this patient for further evaluation and treatment. During my evaluation, patient has no recollection of this morning's events. The last thing she remembers is going to bed last night. Per her daughter who is at bedside, her mental status is significantly better than what it was this morning. Both patient and daughter deny any recent illness, apparently the patient did not have any headache, fever neck pain, nausea, vomiting, chest pain, shortness of breath, diarrhea, dysuria in the past few days as well. Patient's granddaughter was with her the whole day and the family she was at her usual baseline in terms of her mental  status. Patient has been noncompliant with blood pressure pills, other medications because of financial issues.   ALLERGIES:   Allergies  Allergen Reactions  . Penicillins Other (See Comments)    From when younger    PAST MEDICAL HISTORY: Past Medical History  Diagnosis Date  . Diabetes mellitus   . CAD (coronary artery disease) 12/12/2011  . Hypertension, accelerated 12/12/2011    PAST SURGICAL HISTORY: History reviewed. No pertinent past surgical history.  MEDICATIONS AT HOME: Prior to Admission medications   Medication Sig Start Date End Date Taking? Authorizing Provider  insulin aspart (NOVOLOG) 100 UNIT/ML injection Inject into the skin 3 (three) times daily before meals. Per sliding scale.   Yes Historical Provider, MD    FAMILY HISTORY: History reviewed. Mother had Heart disease.  SOCIAL HISTORY:  reports that she has never smoked. She does not have any smokeless tobacco history on file. She reports that she drinks alcohol. She reports that she does not use illicit drugs.  REVIEW OF SYSTEMS:  Constitutional:   No  weight loss, night sweats,  Fevers, chills, fatigue.  HEENT:    No headaches, Difficulty swallowing,Tooth/dental problems,Sore throat,  No sneezing, itching, ear ache, nasal congestion, post nasal drip,   Cardio-vascular: No chest pain,  Orthopnea, PND, swelling in lower extremities, anasarca, dizziness, palpitations  GI:  No heartburn, indigestion, abdominal pain, nausea, vomiting, diarrhea, change in bowel habits, loss of appetite  Resp: No shortness of breath with exertion or at rest.  No excess mucus, no productive cough, No non-productive cough,  No coughing up of blood.No change in color of mucus.No wheezing.No chest wall deformity    Skin:  no rash or lesions.  GU:  no dysuria, change in color of urine, no urgency or frequency.  No flank pain.  Musculoskeletal: No joint pain or swelling.  No decreased range of motion.  No back  pain.  Psych: No change in mood or affect. No depression or anxiety.  No memory loss.   PHYSICAL EXAM: Blood pressure 168/77, pulse 79, temperature 99 F (37.2 C), temperature source Oral, resp. rate 16, SpO2 98.00%.  General appearance :Awake, alert, not in any distress. Speech Clear. Not toxic Looking HEENT: Atraumatic and Normocephalic, pupils equally reactive to light and accomodation Neck: supple, no JVD. No cervical lymphadenopathy.  Chest:Good air entry bilaterally, no added sounds  CVS: S1 S2 regular, no murmurs.  Abdomen: Bowel sounds present, Non tender and not distended with no gaurding, rigidity or rebound. Extremities: B/L Lower Ext shows no edema, both legs are warm to touch, with  dorsalis pedis pulses palpable. Neurology: Awake alert, and oriented X 3, CN II-XII intact, Non focal, Deep Tendon Reflex-2+ all over, plantar's downgoing B/L, sensory exam is grossly intact.  Skin:No Rash Wounds:N/A  LABS ON ADMISSION:   Basename 12/12/11 0932  NA 146*  K 4.4  CL 103  CO2 --  GLUCOSE 266*  BUN 13  CREATININE 0.80  CALCIUM --  MG --  PHOS --   No results found for this basename: AST:2,ALT:2,ALKPHOS:2,BILITOT:2,PROT:2,ALBUMIN:2 in the last 72 hours No results found for this basename: LIPASE:2,AMYLASE:2 in the last 72 hours  Basename 12/12/11 1127 12/12/11 0932  WBC 7.6 --  NEUTROABS 5.1 --  HGB 12.3 13.6  HCT 35.8* 40.0  MCV 82.7 --  PLT 403* --   No results found for this basename: CKTOTAL:3,CKMB:3,CKMBINDEX:3,TROPONINI:3 in the last 72 hours No results found for this basename: DDIMER:2 in the last 72 hours No components found with this basename: POCBNP:3   RADIOLOGIC STUDIES ON ADMISSION: Ct Head Wo Contrast  12/12/2011  *RADIOLOGY REPORT*  Clinical Data: Altered mental status, confusion  CT HEAD WITHOUT CONTRAST  Technique:  Contiguous axial images were obtained from the base of the skull through the vertex without contrast.  Comparison: None  Findings:  Normal ventricular morphology. No midline shift or mass effect. Scattered areas of white matter hypoattenuation question small vessel chronic ischemic change. No intracranial hemorrhage, mass lesion evidence of acute infarction. No extra-axial fluid collections. Atherosclerotic calcification of internal carotid arteries bilaterally at skull base. Visualized paranasal sinuses and mastoid air cells clear. No acute calvarial abnormalities.  IMPRESSION: Question small vessel chronic ischemic changes of deep cerebral white matter. No acute intracranial abnormalities.  Original Report Authenticated By: MARK A. BOLES, M.D.    ASSESSMENT AND PLAN: Present on Admission:  .Altered mental status with amnesia -Not sure what the exact etiology is, she has no foci of infection that is very apparent clinically. Her lungs are clear exam, her urinalysis is negative for UTI, she is afebrile and the neck is very supple. Perhaps she had a seizure secondary to hypo-glycemia resulting in the above symptoms, or perhaps it was hypertensive encephalopathy from a significantly elevated blood pressure.  -In any event, I will obtain an MRI of her brain and EEG.  -We'll also obtain a urinary drug screen  -We'll monitor her mental status closely, currently she seems significantly better and very close to her usual baseline. -She however does have no recollection of the mornings events at all, will see how she progresses with that but better blood pressure control   .Hypertension, accelerated -  Currently started on labetalol infusion by the ED physician, we're going and try and wean this off after starting her on oral lisinopril and hydrochlorothiazide.  -We'll monitor blood pressure closely and see how she does with oral medications.  -She has been noncompliant with her medications primary secondary to financial issues.  -If her blood pressure is not adequately controlled with oral medications, we will then commence a workup for  secondary hypertension.   .Diabetes mellitus -Will check HbA1c, and place on a sliding scale regimen  -Apparently this is type 2 diabetes, and depending on the A1c we can presumably start on oral hypoglycemic agents as well.  .CAD (coronary artery disease) -Currently has no chest pain or shortness of breath. -We'll place on aspirin.  Further plan will depend as patient's clinical course evolves and further radiologic and laboratory data become available. Patient will be monitored closely.  DVT Prophylaxis: -Prophylactic Lovenox   Code Status:  full code  Plan of care was discussed with patient and her daughter at bedside in detail.  Total time spent for admission equals 65 minutes.  Shemica Meath 12/12/2011, 1:23 PM   

## 2011-12-12 NOTE — Progress Notes (Deleted)
PATIENT DETAILS Name: [name] Age: 56 y.o. Sex: female Date of Birth: June 01, 1956 Admit Date: 12/12/2011 PCP:No primary provider on file.   CHIEF COMPLAINT:  Altered mental status  HPI: Patient is a 56 year old Caucasian female with a past medical history of hypertension (noncompliant to medications), diabetes, coronary artery disease status post PCI in approximately 2004 presented to the hospital for the above-noted complaints. Please note that this patient is currently amnesic about what exactly happened to her so most of this history is actually obtained from the patient's daughter. Apparently the patient was in her usual state of health, she woke up this morning and called her daughter, she was kept on asking where her daughter is in where her husband is, she apparently was repeating this over and over again. When her daughter went to place this morning, she was ambulating without any difficulty, her speech was clear. As noted above she was repeatedly asking where her husband was (he currently is in [place]-left a few days ago). The patient then brought her to the emergency room for further evaluation and treatment. In the emergency room patient was found to have a significantly elevated blood pressure, she was then started on a labetalol infusion and I was asked to admit this patient for further evaluation and treatment. During my evaluation, patient has no recollection of this morning's events. The last thing she remembers is going to bed last night. Per her daughter who is at bedside, her mental status is significantly better than what it was this morning. Both patient and daughter deny any recent illness, apparently the patient did not have any headache, fever neck pain, nausea, vomiting, chest pain, shortness of breath, diarrhea, dysuria in the past few days as well. Patient's granddaughter was with her the whole day and the family she was at her usual baseline in terms of her mental  status. Patient has been noncompliant with blood pressure pills, other medications because of financial issues.   ALLERGIES:   Allergies  Allergen Reactions  . Penicillins Other (See Comments)    From when younger    PAST MEDICAL HISTORY: Past Medical History  Diagnosis Date  . Diabetes mellitus   . CAD (coronary artery disease) 12/12/2011  . Hypertension, accelerated 12/12/2011    PAST SURGICAL HISTORY: History reviewed. No pertinent past surgical history.  MEDICATIONS AT HOME: Prior to Admission medications   Medication Sig Start Date End Date Taking? Authorizing Provider  insulin aspart (NOVOLOG) 100 UNIT/ML injection Inject into the skin 3 (three) times daily before meals. Per sliding scale.   Yes Historical Provider, MD    FAMILY HISTORY: History reviewed. Mother had Heart disease.  SOCIAL HISTORY:  reports that she has never smoked. She does not have any smokeless tobacco history on file. She reports that she drinks alcohol. She reports that she does not use illicit drugs.  REVIEW OF SYSTEMS:  Constitutional:   No  weight loss, night sweats,  Fevers, chills, fatigue.  HEENT:    No headaches, Difficulty swallowing,Tooth/dental problems,Sore throat,  No sneezing, itching, ear ache, nasal congestion, post nasal drip,   Cardio-vascular: No chest pain,  Orthopnea, PND, swelling in lower extremities, anasarca, dizziness, palpitations  GI:  No heartburn, indigestion, abdominal pain, nausea, vomiting, diarrhea, change in bowel habits, loss of appetite  Resp: No shortness of breath with exertion or at rest.  No excess mucus, no productive cough, No non-productive cough,  No coughing up of blood.No change in color of mucus.No wheezing.No chest wall deformity  Skin:  no rash or lesions.  GU:  no dysuria, change in color of urine, no urgency or frequency.  No flank pain.  Musculoskeletal: No joint pain or swelling.  No decreased range of motion.  No back  pain.  Psych: No change in mood or affect. No depression or anxiety.  No memory loss.   PHYSICAL EXAM: Blood pressure 168/77, pulse 79, temperature 99 F (37.2 C), temperature source Oral, resp. rate 16, SpO2 98.00%.  General appearance :Awake, alert, not in any distress. Speech Clear. Not toxic Looking HEENT: Atraumatic and Normocephalic, pupils equally reactive to light and accomodation Neck: supple, no JVD. No cervical lymphadenopathy.  Chest:Good air entry bilaterally, no added sounds  CVS: S1 S2 regular, no murmurs.  Abdomen: Bowel sounds present, Non tender and not distended with no gaurding, rigidity or rebound. Extremities: B/L Lower Ext shows no edema, both legs are warm to touch, with  dorsalis pedis pulses palpable. Neurology: Awake alert, and oriented X 3, CN II-XII intact, Non focal, Deep Tendon Reflex-2+ all over, plantar's downgoing B/L, sensory exam is grossly intact.  Skin:No Rash Wounds:N/A  LABS ON ADMISSION:   Basename 12/12/11 0932  NA 146*  K 4.4  CL 103  CO2 --  GLUCOSE 266*  BUN 13  CREATININE 0.80  CALCIUM --  MG --  PHOS --   No results found for this basename: AST:2,ALT:2,ALKPHOS:2,BILITOT:2,PROT:2,ALBUMIN:2 in the last 72 hours No results found for this basename: LIPASE:2,AMYLASE:2 in the last 72 hours  Basename 12/12/11 1127 12/12/11 0932  WBC 7.6 --  NEUTROABS 5.1 --  HGB 12.3 13.6  HCT 35.8* 40.0  MCV 82.7 --  PLT 403* --   No results found for this basename: CKTOTAL:3,CKMB:3,CKMBINDEX:3,TROPONINI:3 in the last 72 hours No results found for this basename: DDIMER:2 in the last 72 hours No components found with this basename: POCBNP:3   RADIOLOGIC STUDIES ON ADMISSION: Ct Head Wo Contrast  12/12/2011  *RADIOLOGY REPORT*  Clinical Data: Altered mental status, confusion  CT HEAD WITHOUT CONTRAST  Technique:  Contiguous axial images were obtained from the base of the skull through the vertex without contrast.  Comparison: None  Findings:  Normal ventricular morphology. No midline shift or mass effect. Scattered areas of white matter hypoattenuation question small vessel chronic ischemic change. No intracranial hemorrhage, mass lesion evidence of acute infarction. No extra-axial fluid collections. Atherosclerotic calcification of internal carotid arteries bilaterally at skull base. Visualized paranasal sinuses and mastoid air cells clear. No acute calvarial abnormalities.  IMPRESSION: Question small vessel chronic ischemic changes of deep cerebral white matter. No acute intracranial abnormalities.  Original Report Authenticated By: Lollie Marrow, M.D.    ASSESSMENT AND PLAN: Present on Admission:  .Altered mental status with amnesia -Not sure what the exact etiology is, she has no foci of infection that is very apparent clinically. Her lungs are clear exam, her urinalysis is negative for UTI, she is afebrile and the neck is very supple. Perhaps she had a seizure secondary to hypo-glycemia resulting in the above symptoms, or perhaps it was hypertensive encephalopathy from a significantly elevated blood pressure.  -In any event, I will obtain an MRI of her brain and EEG.  -We'll also obtain a urinary drug screen  -We'll monitor her mental status closely, currently she seems significantly better and very close to her usual baseline. -She however does have no recollection of the mornings events at all, will see how she progresses with that but better blood pressure control   .Hypertension, accelerated -  Currently started on labetalol infusion by the ED physician, we're going and try and wean this off after starting her on oral lisinopril and hydrochlorothiazide.  -We'll monitor blood pressure closely and see how she does with oral medications.  -She has been noncompliant with her medications primary secondary to financial issues.  -If her blood pressure is not adequately controlled with oral medications, we will then commence a workup for  secondary hypertension.   .Diabetes mellitus -Will check HbA1c, and place on a sliding scale regimen  -Apparently this is type 2 diabetes, and depending on the A1c we can presumably start on oral hypoglycemic agents as well.  Marland KitchenCAD (coronary artery disease) -Currently has no chest pain or shortness of breath. -We'll place on aspirin.  Further plan will depend as patient's clinical course evolves and further radiologic and laboratory data become available. Patient will be monitored closely.  DVT Prophylaxis: -Prophylactic Lovenox   Code Status:  full code  Plan of care was discussed with patient and her daughter at bedside in detail.  Total time spent for admission equals 65 minutes.  Jeoffrey Massed 12/12/2011, 1:23 PM

## 2011-12-12 NOTE — ED Notes (Signed)
Dr. Jerral Ralph came to see the patient, instructed to wean the patient off from the Lopressor drip and to start po BP medicines. Pt is NAD, will continue to monitor.

## 2011-12-12 NOTE — ED Notes (Signed)
Code Stroke not called, pt LSN last night. Pt unable to tell me birthday or the year. No facial droop or arm drift noted.

## 2011-12-12 NOTE — ED Provider Notes (Signed)
History     CSN: [id number]  Arrival date & time 12/12/11  4098   First MD Initiated Contact with Patient 12/12/11 0911      Chief Complaint  Patient presents with  . Altered Mental Status    (Consider location/radiation/quality/duration/timing/severity/associated sxs/prior treatment) The history is provided by the patient and a relative.  the patient is a 56 year old, female, with a history of diabetes, and hypertension.  She has not taken any medications because she is unemployed and doesn't have insurance at this time.  She called her daughter today, who noted that she did not sound, coherent  So.  Her daughter went to her house and then brought her to the emergency department for evaluation.  The patient states that she is confused.  She does not know the day or date.  She does not month.  She does not otherwise, she is in the emergency department.  She denies a headache.  She denies vision changes.  She denies nausea, vomiting, cough, or shortness of breath.  She has not had this before.  Level V caveat applies for altered mental status and urgent need for intervention.  Because of a hypertensive  Past Medical History  Diagnosis Date  . Diabetes mellitus     History reviewed. No pertinent past surgical history.  History reviewed. No pertinent family history.  History  Substance Use Topics  . Smoking status: Never Smoker   . Smokeless tobacco: Not on file  . Alcohol Use: Yes     occasional    OB History    Grav Para Term Preterm Abortions TAB SAB Ect Mult Living                  Review of Systems  Unable to perform ROS   Allergies  Penicillins  Home Medications   Current Outpatient Rx  Name Route Sig Dispense Refill  . INSULIN ASPART 100 UNIT/ML [place] SOLN Subcutaneous Inject into the skin 3 (three) times daily before meals. Per sliding scale.      BP 177/80  Pulse 79  Temp 99 F (37.2 C) (Oral)  Resp 14  SpO2 99%  Physical Exam  Nursing note and vitals  reviewed. Constitutional: She appears well-developed and well-nourished. No distress.  HENT:  Head: Normocephalic and atraumatic.  Eyes: Conjunctivae and EOM are normal. Pupils are equal, round, and reactive to light.  Neck: Normal range of motion. Neck supple.       Carotid bruit on the right side  Cardiovascular: Normal rate.   No murmur heard. Pulmonary/Chest: Effort normal and breath sounds normal. She has no rales.  Abdominal: Soft. Bowel sounds are normal. She exhibits no distension. There is no tenderness.  Musculoskeletal: Normal range of motion. She exhibits no edema and no tenderness.  Neurological: She is alert.       Disoriented to time  Skin: Skin is warm and dry.    ED Course  Procedures (including critical care time) 56 year old, female, with severe hypertension, causing, hepatic encephalopathy.  We'll perform CAT scans, laboratory testing, and start IV, labetalol to reduce her blood pressure by 20%  Labs Reviewed  URINALYSIS, ROUTINE W REFLEX MICROSCOPIC - Abnormal; Notable for the following:    Glucose, UA 500 (*)     Protein, ur 30 (*)     All other components within normal limits  POCT I-STAT, CHEM 8 - Abnormal; Notable for the following:    Sodium 146 (*)     Glucose, Bld 266 (*)  All other components within normal limits  CBC - Abnormal; Notable for the following:    HCT 35.8 (*)     Platelets 403 (*)     All other components within normal limits  DIFFERENTIAL  URINE MICROSCOPIC-ADD ON   Ct Head Wo Contrast  12/12/2011  *RADIOLOGY REPORT*  Clinical Data: Altered mental status, confusion  CT HEAD WITHOUT CONTRAST  Technique:  Contiguous axial images were obtained from the base of the skull through the vertex without contrast.  Comparison: None  Findings: Normal ventricular morphology. No midline shift or mass effect. Scattered areas of white matter hypoattenuation question small vessel chronic ischemic change. No intracranial hemorrhage, mass lesion  evidence of acute infarction. No extra-axial fluid collections. Atherosclerotic calcification of internal carotid arteries bilaterally at skull base. Visualized paranasal sinuses and mastoid air cells clear. No acute calvarial abnormalities.  IMPRESSION: Question small vessel chronic ischemic changes of deep cerebral white matter. No acute intracranial abnormalities.  Original Report Authenticated By: Lollie Marrow, M.D.     1. Hypertensive encephalopathy    CRITICAL CARE Performed by: Nicholes Stairs   Total critical care time: 30 min  Critical care time was exclusive of separately billable procedures and treating other patients.  Critical care was necessary to treat or prevent imminent or life-threatening deterioration.  Critical care was time spent personally by me on the following activities: development of treatment plan with patient and/or surrogate as well as nursing, discussions with consultants, evaluation of patient's response to treatment, examination of patient, obtaining history from patient or surrogate, ordering and performing treatments and interventions, ordering and review of laboratory studies, ordering and review of radiographic studies, pulse oximetry and re-evaluation of patient's condition.  1:21 PM Spoke with triad me. He will admit  MDM  Hepatic encephalopathy        Cheri Guppy, MD 12/12/11 1321

## 2011-12-12 NOTE — Progress Notes (Signed)
  Echocardiogram 2D Echocardiogram has been performed.  Arzella Rehmann FRANCES 12/12/2011, 5:25 PM

## 2011-12-12 NOTE — ED Provider Notes (Deleted)
Medical screening examination/treatment/procedure(s) were conducted as a shared visit with non-physician practitioner(s) and myself.  I personally evaluated the patient during the encounter Lives in NH.  Fell in [employer].  Was seen at [facility] ED.  Head cat neg.  Today NH staff thought pt more confused.  On my exam, pt seem oriented. She is able to describe fall yest and eval in ed.   She denies pain anywhere.  She denies systemic sxs.   She has contusion over right forehead and periorbital area.  She denies recurrent fall. Will check labs and repeat ct.    Cheri Guppy, MD 12/12/11 1032

## 2011-12-12 NOTE — ED Notes (Signed)
CBG 256, notified RN

## 2011-12-12 NOTE — ED Notes (Signed)
Patient transported to CT 

## 2011-12-12 NOTE — ED Notes (Signed)
Pt here with confusion this am upon waking; pt alert to person, place and time but confused about year; pt with no obvious neuro deficits; PERRL; pt with hx of ID DM and could be out of insulin; pt sts she does not remember; pt with family CBG 256; pt ambulatory and mae well; pt sts does not remember last time she ate

## 2011-12-13 ENCOUNTER — Inpatient Hospital Stay (HOSPITAL_COMMUNITY): Payer: Self-pay

## 2011-12-13 DIAGNOSIS — G459 Transient cerebral ischemic attack, unspecified: Secondary | ICD-10-CM

## 2011-12-13 DIAGNOSIS — I6340 Cerebral infarction due to embolism of unspecified cerebral artery: Secondary | ICD-10-CM

## 2011-12-13 DIAGNOSIS — E118 Type 2 diabetes mellitus with unspecified complications: Secondary | ICD-10-CM

## 2011-12-13 DIAGNOSIS — G454 Transient global amnesia: Secondary | ICD-10-CM

## 2011-12-13 DIAGNOSIS — I6529 Occlusion and stenosis of unspecified carotid artery: Secondary | ICD-10-CM

## 2011-12-13 DIAGNOSIS — I10 Essential (primary) hypertension: Secondary | ICD-10-CM

## 2011-12-13 DIAGNOSIS — R4182 Altered mental status, unspecified: Secondary | ICD-10-CM

## 2011-12-13 DIAGNOSIS — E1165 Type 2 diabetes mellitus with hyperglycemia: Secondary | ICD-10-CM

## 2011-12-13 LAB — COMPREHENSIVE METABOLIC PANEL
AST: 14 U/L (ref 0–37)
Albumin: 3.4 g/dL — ABNORMAL LOW (ref 3.5–5.2)
Alkaline Phosphatase: 108 U/L (ref 39–117)
BUN: 12 mg/dL (ref 6–23)
CO2: 25 mEq/L (ref 19–32)
Chloride: 102 mEq/L (ref 96–112)
Creatinine, Ser: 0.73 mg/dL (ref 0.50–1.10)
GFR calc non Af Amer: >90 mL/min (ref >90)
Potassium: 4 mEq/L (ref 3.5–5.1)
Total Bilirubin: 0.4 mg/dL (ref 0.3–1.2)

## 2011-12-13 LAB — GLUCOSE, CAPILLARY
Glucose-Capillary: 285 mg/dL — ABNORMAL HIGH (ref 70–99)
Glucose-Capillary: 321 mg/dL — ABNORMAL HIGH (ref 70–99)

## 2011-12-13 LAB — CBC
HCT: 35.3 % — ABNORMAL LOW (ref 36.0–46.0)
MCV: 82.9 fL (ref 78.0–100.0)
RBC: 4.26 MIL/uL (ref 3.87–5.11)
RDW: 12.4 % (ref 11.5–15.5)
WBC: 7.5 10*3/uL (ref 4.0–10.5)

## 2011-12-13 LAB — LIPID PANEL
Cholesterol: 204 mg/dL — ABNORMAL HIGH (ref 0–200)
Total CHOL/HDL Ratio: 4.6 RATIO

## 2011-12-13 LAB — SEDIMENTATION RATE: Sed Rate: 66 mm/hr — ABNORMAL HIGH (ref 0–22)

## 2011-12-13 MED ORDER — INSULIN ASPART 100 UNIT/ML ~~LOC~~ SOLN
0.0000 [IU] | Freq: Three times a day (TID) | SUBCUTANEOUS | Status: DC
  Administered 2011-12-13: 11 [IU] via SUBCUTANEOUS
  Administered 2011-12-14: 8 [IU] via SUBCUTANEOUS

## 2011-12-13 MED ORDER — INSULIN GLARGINE 100 UNIT/ML ~~LOC~~ SOLN
15.0000 [IU] | Freq: Every day | SUBCUTANEOUS | Status: DC
  Administered 2011-12-13: 15 [IU] via SUBCUTANEOUS

## 2011-12-13 MED ORDER — INSULIN ASPART 100 UNIT/ML ~~LOC~~ SOLN: 0.0000 [IU] | Freq: Every day | SUBCUTANEOUS | Status: DC

## 2011-12-13 NOTE — Progress Notes (Signed)
Inpatient Diabetes Program Recommendations  AACE/ADA: New Consensus Statement on Inpatient Glycemic Control (2009)  Target Ranges:  Prepandial:   less than 140 mg/dL      Peak postprandial:   less than 180 mg/dL (1-2 hours)      Critically ill patients:  140 - 180 mg/dL   Reason for Visit: CBGs 12/12/11  270-286 mg/dl    08/31/63  784/696 mg/dl  Inpatient Diabetes Program Recommendations Insulin - Basal: Start Lantus 15 units daily if CBGs continue greater than 180 mg/dl  Note: EXBM8U is 13.2% on 12/12/11  Will need follow up with primary care physician at discharge for home blood glucose control.

## 2011-12-13 NOTE — Care Management Note (Signed)
    Page 1 of 1   12/17/2011     8:11:03 AM   CARE MANAGEMENT NOTE 12/17/2011  Patient:  [name], [name]   Account Number:  [id number]  Date Initiated:  12/13/2011  Documentation initiated by:  [facility]  Subjective/Objective Assessment:   Admitted with AMS. Lives with spouse.     Action/Plan:   PT eval- no d/c needs   Anticipated DC Date:  12/15/2011   Anticipated DC Plan:  HOME/SELF CARE      DC Planning Services  CM consult  Medication Assistance      Choice offered to / List presented to:             Status of service:  Completed, signed off Medicare Important Message given?   (If response is "NO", the following Medicare IM given date fields will be blank) Date Medicare IM given:   Date Additional Medicare IM given:    Discharge Disposition:  HOME/SELF CARE  Per UR Regulation:  Reviewed for med. necessity/level of care/duration of stay  If discussed at Long Length of Stay Meetings, dates discussed:    Comments:  12/14/11 Obtained plavix and insulin via indegent fund. Jacquelynn Cree RN, BSN, CCM   12/13/11 Spoke with patient and her husband about finding PCP. Gave patient information on the [facility] in [place] and the [facility] in [place]. Patient states that she will call [facility] next week to make appt in July per clinics instructions. Gave patient discount pharmacy card and per [facility] pharmacy patient is eligible for indigent fund. Will continue to follow to assist with meds at discharge. Jacquelynn Cree RN, BSN, CCM

## 2011-12-13 NOTE — Progress Notes (Signed)
Stroke Team Progress Note  HISTORY [name] is an 56 y.o. female history of diabetes mellitus, coronary artery disease, hypertension and hyperlipidemia, presenting 12/12/2011 with a history of confusion with fairly striking short-term memory difficulty. Patient asked same questions over and over despite being given the correct answers by family members. Symptoms lasted at least several hours. There was no focal weakness. Speech was not slurred. There is no previous history of similar symptoms. Patient has no memory for any events this morning and early afternoon today. She has not been on antiplatelet therapy. CT scan showed no acute intracranial abnormality. MRI showed multiple small areas of restricted diffusion suggestive of acute infarctions of possible proximal embolic source since multiple vascular territories were involved. MRA of the head showed no significant stenotic or occlusive lesion. MRA of the neck showed high-grade right internal carotid artery stenosis as well as moderate stenosis at the origin of the right external carotid artery. Patient was not a TPA candidate secondary to presenting outside the TPA window. She was admitted for further evaluation and treatment.  SUBJECTIVE No family is at the bedside.  Overall she feels her condition is gradually improving, though she still has no memory of yesterday events..She has remote h/o CAD s/p stents but has not been on any medicines as she cannot afford them and has not seen any MD in years.  OBJECTIVE Most recent Vital Signs: Filed Vitals:   12/12/11 2200 12/13/11 0149 12/13/11 0155 12/13/11 0637  BP: 176/80 173/111 170/92 173/76  Pulse: 78 85 81 79  Temp: 98.6 F (37 C) 99 F (37.2 C)  98.8 F (37.1 C)  TempSrc: Oral Oral  Oral  Resp: 18 18  18   SpO2: 94% 95%  93%   CBG (last 3)   Basename 12/13/11 0709 12/12/11 2151 12/12/11 1630  GLUCAP 285* 286* 270*   Intake/Output from previous day:   IV Fluid Intake:     .  DISCONTD: sodium chloride 125 mL/hr at 12/12/11 0924  . DISCONTD: labetalol (NORMODYNE) infusion Stopped (12/12/11 1403)   MEDICATIONS    . acetaminophen  650 mg Oral Once  . aspirin  325 mg Oral Daily  . enoxaparin  40 mg Subcutaneous Q24H  . insulin aspart  0-9 Units Subcutaneous TID WC  . labetalol  20 mg Intravenous Once  . lisinopril  5 mg Oral Daily  . LORazepam  1 mg Intravenous Once  . senna  1 tablet Oral BID  . simvastatin  10 mg Oral q1800  . sodium chloride  3 mL Intravenous Q12H  . sodium chloride  3 mL Intravenous Q12H  . DISCONTD: sodium chloride   Intravenous STAT  . DISCONTD: hydrochlorothiazide  12.5 mg Oral Daily   PRN:  sodium chloride, acetaminophen, acetaminophen, gadobenate dimeglumine, HYDROcodone-acetaminophen, labetalol, ondansetron (ZOFRAN) IV, ondansetron, sodium chloride  Diet:  Cardiac thin liquids Activity:  Up with assistance DVT Prophylaxis:  Lovenox 40 mg sq daily   CLINICALLY SIGNIFICANT STUDIES Basic Metabolic Panel:  Lab 12/12/11 1610 12/12/11 0932  NA -- 146*  K -- 4.4  CL -- 103  CO2 -- --  GLUCOSE -- 266*  BUN -- 13  CREATININE 0.77 0.80  CALCIUM -- --  MG -- --  PHOS -- --   CBC:  Lab 12/13/11 0710 12/12/11 1646 12/12/11 1127  WBC 7.5 6.6 --  NEUTROABS -- -- 5.1  HGB 11.8* 11.6* --  HCT 35.3* 33.8* --  MCV 82.9 82.2 --  PLT 393 369 --   Urinalysis:  Lab 12/12/11 0936  COLORURINE YELLOW  LABSPEC 1.012  PHURINE 7.0  GLUCOSEU 500*  HGBUR NEGATIVE  BILIRUBINUR NEGATIVE  KETONESUR NEGATIVE  PROTEINUR 30*  UROBILINOGEN 0.2  NITRITE NEGATIVE  LEUKOCYTESUR NEGATIVE   Lipid Panel    Component Value Date/Time   CHOL  Value: 283        ATP III CLASSIFICATION:  <200     mg/dL   Desirable  161-096  mg/dL   Borderline High  >=045    mg/dL   High* 40/98/1191 4782   TRIG 213* 06/05/2008 0515   HDL 23* 06/05/2008 0515   CHOLHDL 12.3 06/05/2008 0515   VLDL 43* 06/05/2008 0515   LDLCALC  Value: 217        Total  Cholesterol/HDL:CHD Risk Coronary Heart Disease Risk Table                     Men   Women  1/2 Average Risk   3.4   3.3* 06/05/2008 0515   HgbA1C  Lab Results  Component Value Date   HGBA1C 11.6* 12/12/2011    Urine Drug Screen:     Component Value Date/Time   LABOPIA NONE DETECTED 12/12/2011 0936   COCAINSCRNUR NONE DETECTED 12/12/2011 0936   LABBENZ NONE DETECTED 12/12/2011 0936   AMPHETMU NONE DETECTED 12/12/2011 0936   THCU NONE DETECTED 12/12/2011 0936   LABBARB NONE DETECTED 12/12/2011 0936    Alcohol Level: No results found for this basename: ETH:2 in the last 168 hours  CT of the brain  12/12/2011  Question small vessel chronic ischemic changes of deep cerebral white matter. No acute intracranial abnormalities.  Marland Kitchen  MRI of the brain  12/12/2011  Multiple small areas of restricted diffusion suggestive of acute infarct.  If these are  infarction, they are likely embolic as they involve multiple vascular distributions.  Normal enhancement pattern.  No mass lesion.   MRA of the brain  12/12/2011   Probable atherosclerotic disease involving the left distal vertebral artery and left posterior cerebral artery.  No large vessel occlusion.    MRA of the neck 12/12/2011 : Critical stenosis proximal right internal carotid artery, estimated to be greater than 85% diameter stenosis.  There is also a moderate to severe stenosis of the origin of the right external carotid artery.  Approximately 40% diameter stenosis of the proximal left internal carotid artery.    2D Echocardiogram  EF 50-55% with no source of embolus.   Carotid Doppler  See MRA of the neck  CXR  12/12/2011   Minimal bronchitic changes and right middle lobe scarring.   EKG  normal sinus rhythm.   Therapy Recommendations PT -, OT -  Physical Exam  Pleasant middle aged Caucasian lady not in distress.Awake alert. Afebrile. Head is nontraumatic. Neck is supple without bruit. Hearing is normal. Cardiac exam no murmur or gallop. Lungs  are clear to auscultation. Distal pulses are well felt.  Neurological Exam : Awake  Alert oriented x 3.Diminished recall 0/3 but normal attention and registration. Normal speech and language.eye movements full without nystagmus.Normal visual acuity and fields. Fundi not visualized. Face symmetric. Tongue midline. Normal strength, tone, reflexes and coordination. Normal sensation. Gait deferred.  ASSESSMENT Ms. Undrea Archbold is a 56 y.o. 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 Infarcts potentially silent due to small vessel disease vs embolic vs silent in setting of seizures. Workup underway. On no antiplatelets prior to admission. Now on aspirin  325 mg orally every day for secondary stroke prevention.   -MRA neck shows flow gap R ICA. ? Stenosis vs poor flow as vessel caliber prior to stenosis same as vessel caliber beyond stenosis -diabetes, HgbA1c 11.6 -dyslipidemia, LDL 217 -CAD -hypertension -headache/migraine hx  Hospital day # 1  TREATMENT/PLAN -Continue aspirin 325 mg orally every day for secondary stroke prevention. -may travel off tele -check EEG given periods of confusion, memory loss -check carotid doppler -TEE to look for embolic source. If positive for PFO (patent foramen ovale), check bilateral lower extremity venous dopplers to rule out DVT as possible source of stroke. I arranged with [facility] -risk factor control by primary MD -will need outpatient telemetry monitoring to assess patient for atrial fibrillation as source of stroke. May be arranged with patient's cardiologist, or cardiologist of choice.  -check hypercoagulable, vasculitic labs  SHARON BIBY, AVNP, ANP-BC, GNP-BC [facility] Pager: [phone] 12/13/2011 8:21 AM  Dr. Delia Heady, Stroke Center Medical Director, has personally reviewed chart, pertinent data, examined the patient and developed the plan of care. Pager:  [phone]

## 2011-12-13 NOTE — Evaluation (Signed)
Physical Therapy Evaluation Patient Details Name: [name] MRN: [id number] DOB: 07/17/1955 Today's Date: 12/13/2011 Time: 3086-5784 PT Time Calculation (min): 15 min  PT Assessment / Plan / Recommendation Clinical Impression  Pt presents with a medical diagnosis of CVA. Pt with no physical deficits, slightly slower although has had minimal activity for a couple days. No acute PT needs, will not follow.    PT Assessment  Patent does not need any further PT services    Follow Up Recommendations  No PT follow up;Supervision - Intermittent       lEquipment Recommendations  None recommended by PT          Precautions / Restrictions Precautions Precautions: None Restrictions Weight Bearing Restrictions: No         Mobility  Bed Mobility Bed Mobility: Supine to Sit;Sitting - Scoot to Edge of Bed Supine to Sit: 6: Modified independent (Device/Increase time) Sitting - Scoot to Edge of Bed: 6: Modified independent (Device/Increase time) Transfers Transfers: Sit to Stand;Stand to Sit Sit to Stand: 6: Modified independent (Device/Increase time);With upper extremity assist;From bed;From toilet Stand to Sit: 6: Modified independent (Device/Increase time);With upper extremity assist;To chair/3-in-1;To toilet Details for Transfer Assistance: No physical assist or cueing needed Ambulation/Gait Ambulation/Gait Assistance: 5: Supervision Ambulation Distance (Feet): 200 Feet Assistive device: None Ambulation/Gait Assistance Details: Supervision for safety. Pt complained of knees giving out, which she stated has been going on for months.  Gait Pattern: Step-through pattern;Wide base of support Modified Rankin (Stroke Patients Only) Pre-Morbid Rankin Score: No symptoms Modified Rankin: No significant disability        Visit Information  Last PT Received On: 12/13/11 Assistance Needed: +1    Subjective Data      Prior Functioning  Home Living Lives With: Alone Available  Help at Discharge: Family;Available 24 hours/day Type of Home: House Home Access: Stairs to enter [employer] of Steps: 4 Entrance Stairs-Rails: None Home Layout: Two level;Able to live on main level with bedroom/bathroom Alternate Level Stairs-Number of Steps: 13 Alternate Level Stairs-Rails: Can reach both Bathroom Shower/Tub: Tub/shower unit;[employer]: Standard Bathroom Accessibility: Yes How Accessible: Accessible via walker Home Adaptive Equipment: None Prior Function Level of Independence: Independent Able to Take Stairs?: Yes Driving: Yes Vocation: Unemployed Communication Communication: No difficulties Dominant Hand: Right    Cognition  Overall Cognitive Status: Appears within functional limits for tasks assessed/performed Arousal/Alertness: Awake/alert Orientation Level: Appears intact for tasks assessed Behavior During Session: [facility] for tasks performed    Extremity/Trunk Assessment Right Lower Extremity Assessment RLE ROM/Strength/Tone: Within functional levels RLE Sensation: WFL - Light Touch Left Lower Extremity Assessment LLE ROM/Strength/Tone: Within functional levels LLE Sensation: WFL - Light Touch   Balance Balance Balance Assessed: Yes Standardized Balance Assessment Standardized Balance Assessment: Dynamic Gait Index Dynamic Gait Index Level Surface: Normal Change in Gait Speed: Normal Gait with Horizontal Head Turns: Normal Gait with Vertical Head Turns: Normal Gait and Pivot Turn: Normal Step Over Obstacle: Normal Step Around Obstacles: Normal Steps: Normal Total Score: 24   End of Session PT - End of Session Equipment Utilized During Treatment: Gait belt Activity Tolerance: Patient tolerated treatment well Patient left: in chair;with call bell/phone within reach;with nursing in room Nurse Communication: Mobility status   Milana Kidney 12/13/2011, 10:26 AM  12/13/2011 Milana Kidney DPT PAGER:  [phone] OFFICE: [phone]

## 2011-12-13 NOTE — Clinical Social Work Psychosocial (Signed)
     Clinical Social Work Department BRIEF PSYCHOSOCIAL ASSESSMENT 12/13/2011  Patient:  [name], [name]     Account Number:  [id number]     Admit date:  12/12/2011  Clinical Social Worker:  Peggyann Shoals  Date/Time:  12/13/2011 05:26 PM  Referred by:  RN  Date Referred:  12/13/2011 Referred for  Advanced Directives   Other Referral:   Interview type:  Patient Other interview type:    PSYCHOSOCIAL DATA Living Status:  FAMILY Admitted from facility:   Level of care:   Primary support name:  [name] Primary support relationship to patient:  SPOUSE Degree of support available:   Supportive    CURRENT CONCERNS Current Concerns  Other - See comment   Other Concerns:   Advanced Directives.    SOCIAL WORK ASSESSMENT / PLAN CSW met with pt to address consult. CSW introduced herself and explained role of social work. CSW provided Advanced Directives packet with MOST form and explained material. Pt shared that her sister was her HCPOA, however she was in the process of changing it over. Pt reported that her will was established a number of years ago when her children were small. Pt reported that her children are adults. Pt shared that she will review the material provided and follow up with CSW if she would like to establish HCPOA.    Pt will be discharging home as PT has not recommended any PT follow up. CSW will continue to follow.   Assessment/plan status:  Other - See comment Other assessment/ plan:   Follow up regarding Advanced Directives.   Information/referral to community resources:   Advanced Directives    PATIENTS/FAMILYS RESPONSE TO PLAN OF CARE: Pt was very pleasant and appreciative of CSW intervention.

## 2011-12-13 NOTE — Progress Notes (Signed)
Subjective:   Chart reviewed. Patient has mild headache but denies dizziness or lightheadedness. She has minimal recollection of events since night before last. Denies slurred speech, asymmetric limb weakness or facial asymmetry.  Patient does not have a PCP. Patient spouse was laid off approximately 6 months ago and do not have insurance.  Objective  Vital signs in last 24 hours: Filed Vitals:   12/13/11 0149 12/13/11 0155 12/13/11 0637 12/13/11 1400  BP: 173/111 170/92 173/76 135/66  Pulse: 85 81 79 77  Temp: 99 F (37.2 C)  98.8 F (37.1 C)   TempSrc: Oral  Oral Oral  Resp: 18  18 18   Height:    5\' 6"  (1.676 m)  Weight:    81.647 kg (180 lb)  SpO2: 95%  93% 97%   Weight change:   Intake/Output Summary (Last 24 hours) at 12/13/11 1553 Last data filed at 12/13/11 1200  Gross per 24 hour  Intake    360 ml  Output      0 ml  Net    360 ml    Physical Exam:  General Exam: Comfortable.  Respiratory System: Clear. No increased work of breathing.  Cardiovascular System: First and second heart sounds heard. Regular rate and rhythm. No JVD/murmurs. Telemetry shows normal sinus rhythm without any arrhythmia alarms.  Gastrointestinal System: Abdomen is non distended, soft and normal bowel sounds heard. Nontender. Central Nervous System: Alert and oriented. No focal neurological deficits. Short term memory impairment. Extremities: Symmetric 5 x 5 power.  Labs:  Basic Metabolic Panel:  Lab 12/13/11 1610 12/12/11 1646 12/12/11 0932  NA 139 -- 146*  K 4.0 -- 4.4  CL 102 -- 103  CO2 25 -- --  GLUCOSE 302* -- 266*  BUN 12 -- 13  CREATININE 0.73 0.77 0.80  CALCIUM 9.8 -- --  ALB -- -- --  PHOS -- -- --   Liver Function Tests:  Lab 12/13/11 0710  AST 14  ALT 9  ALKPHOS 108  BILITOT 0.4  PROT 6.9  ALBUMIN 3.4*   No results found for this basename: LIPASE:3,AMYLASE:3 in the last 168 hours No results found for this basename: AMMONIA:3 in the last 168 hours CBC:  Lab  12/13/11 0710 12/12/11 1646 12/12/11 1127  WBC 7.5 6.6 7.6  NEUTROABS -- -- 5.1  HGB 11.8* 11.6* 12.3  HCT 35.3* 33.8* 35.8*  MCV 82.9 82.2 82.7  PLT 393 369 403*   Cardiac Enzymes: No results found for this basename: CKTOTAL:5,CKMB:5,CKMBINDEX:5,TROPONINI:5 in the last 168 hours CBG:  Lab 12/13/11 1205 12/13/11 0709 12/12/11 2151 12/12/11 1630 12/12/11 0854  GLUCAP 314* 285* 286* 270* 256*   Other lab data:  1. Lipid panel: Cholesterol 204, triglycerides 126, HDL 44, LDL 135, VLDL 25. 2. ESR: 66 3. Antithrombin III function: 103 4. Hemoglobin A1c: 11.6 5. Urine drug screen: Negative   Iron Studies: No results found for this basename: IRON,TIBC,TRANSFERRIN,FERRITIN in the last 72 hours  Studies/Results:  CT of the brain 12/12/2011 Question small vessel chronic ischemic changes of deep cerebral white matter. No acute intracranial abnormalities.  Marland Kitchen  MRI of the brain 12/12/2011 Multiple small areas of restricted diffusion suggestive of acute infarct. If these are infarction, they are likely embolic as they involve multiple vascular distributions. Normal enhancement pattern. No mass lesion.   MRA of the brain 12/12/2011 Probable atherosclerotic disease involving the left distal vertebral artery and left posterior cerebral artery. No large vessel occlusion.   MRA of the neck 12/12/2011 : Critical stenosis  proximal right internal carotid artery, estimated to be greater than 85% diameter stenosis. There is also a moderate to severe stenosis of the origin of the right external carotid artery. Approximately 40% diameter stenosis of the proximal left internal carotid artery.   2D Echocardiogram EF 50-55% with no source of embolus.   Carotid Doppler See MRA of the neck   CXR 12/12/2011 Minimal bronchitic changes and right middle lobe scarring.   EKG normal sinus rhythm.   Medications:    . DISCONTD: labetalol (NORMODYNE) infusion Stopped (12/12/11 1403)      . aspirin  325 mg Oral  Daily  . enoxaparin  40 mg Subcutaneous Q24H  . insulin aspart  0-9 Units Subcutaneous TID WC  . lisinopril  5 mg Oral Daily  . senna  1 tablet Oral BID  . simvastatin  10 mg Oral q1800  . sodium chloride  3 mL Intravenous Q12H  . sodium chloride  3 mL Intravenous Q12H  . DISCONTD: sodium chloride   Intravenous STAT  . DISCONTD: hydrochlorothiazide  12.5 mg Oral Daily    I  have reviewed scheduled and prn medications.     Problem/Plan: Principal Problem:  *Altered mental status Active Problems:  Hypertension, accelerated  Diabetes mellitus  CAD (coronary artery disease)  1. 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 Working up for embolic source. Continue aspirin for secondary stroke prevention. Continue simvastatin. Outpatient heart monitor on discharge to rule out A. fib. 2. Periods of confusion and memory loss:? Secondary to multiple CVAs versus rule out seizures. Checking EEG. 3. Uncontrolled type 2 diabetes: Start Lantus and continue sliding scale insulin. Hemoglobin A1c: 11.6. 4. Hypertension: Continue low dose lisinopril. Above for permissive hypertension secondary to recent stroke. 5. ? Right ICA and ECA stenosis: Await further recommendations by neurology. Continue aspirin. 6. Hyperlipidemia: Statin started. 7. Anemia: Probably chronic and unclear etiology. Outpatient evaluation. 8. History of coronary artery disease status post PCI: Asymptomatic of chest pain. Continue aspirin. 9. Full code.  Discussed at length with patient and patient's spouse at the bedside and updated care and answered questions.  Elsey Holts 12/13/2011,3:53 PM  LOS: 1 day

## 2011-12-13 NOTE — Discharge Instructions (Signed)
STROKE/TIA DISCHARGE INSTRUCTIONS SMOKING Cigarette smoking nearly doubles your risk of having a stroke & is the single most alterable risk factor  If you smoke or have smoked in the last 12 months, you are advised to quit smoking for your health.  Most of the excess cardiovascular risk related to smoking disappears within a year of stopping.  Ask you doctor about anti-smoking medications  [place] Quit Line: 1-800-QUIT NOW  Free Smoking Cessation Classes [phone]  CHOLESTEROL Know your levels; limit fat & cholesterol in your diet  Lipid Panel     Component Value Date/Time   CHOL 204* 12/13/2011 0710   TRIG 126 12/13/2011 0710   HDL 44 12/13/2011 0710   CHOLHDL 4.6 12/13/2011 0710   VLDL 25 12/13/2011 0710   LDLCALC 135* 12/13/2011 0710      Many patients benefit from treatment even if their cholesterol is at goal.  Goal: Total Cholesterol (CHOL) less than 160  Goal:  Triglycerides (TRIG) less than 150  Goal:  HDL greater than 40  Goal:  LDL (LDLCALC) less than 100   BLOOD PRESSURE American Stroke Association blood pressure target is less that 120/80 mm/Hg  Your discharge blood pressure is:  BP: 173/76 mmHg  Monitor your blood pressure  Limit your salt and alcohol intake  Many individuals will require more than one medication for high blood pressure  DIABETES (A1c is a blood sugar average for last 3 months) Goal HGBA1c is under 7% (HBGA1c is blood sugar average for last 3 months)  Diabetes: {STROKE DC DIABETES:22357}    Lab Results  Component Value Date   HGBA1C 11.6* 12/12/2011     Your HGBA1c can be lowered with medications, healthy diet, and exercise.  Check your blood sugar as directed by your physician  Call your physician if you experience unexplained or low blood sugars.  PHYSICAL ACTIVITY/REHABILITATION Goal is 30 minutes at least 4 days per week    {STROKE DC ACTIVITY/REHAB:22359}  Activity decreases your risk of heart attack and stroke and makes your heart  stronger.  It helps control your weight and blood pressure; helps you relax and can improve your mood.  Participate in a regular exercise program.  Talk with your doctor about the best form of exercise for you (dancing, walking, swimming, cycling).  DIET/WEIGHT Goal is to maintain a healthy weight  Your discharge diet is: Cardiac *** liquids Your height is:    Your current weight is:   Your Body Mass Index (BMI) is:     Following the type of diet specifically designed for you will help prevent another stroke.  Your goal weight range is:  ***  Your goal Body Mass Index (BMI) is 19-24.  Healthy food habits can help reduce 3 risk factors for stroke:  High cholesterol, hypertension, and excess weight.  RESOURCES Stroke/Support Group:  Call [phone]  they meet the 3rd Sunday of the month on the Rehab Unit at [facility] [place] [place] ( no meetings June, July & Aug).  STROKE EDUCATION PROVIDED/REVIEWED AND GIVEN TO PATIENT Stroke warning signs and symptoms How to activate emergency medical system (call 911). Medications prescribed at discharge. Need for follow-up after discharge. Personal risk factors for stroke. Pneumonia vaccine given:   {STROKE DC YES/NO/DATE:22363} Flu vaccine given:   {STROKE DC YES/NO/DATE:22363} My questions have been answered, the writing is legible, and I understand these instructions.  I will adhere to these goals & educational materials that have been provided to me after my discharge from the hospital.

## 2011-12-13 NOTE — Evaluation (Signed)
Speech Language Pathology Evaluation Patient Details Name: [name] MRN: [id number] DOB: 10-Aug-1955 Today's Date: 12/13/2011 Time: 4098-1191 SLP Time Calculation (min): 28 min  Problem List:  Patient Active Problem List  Diagnosis  . Altered mental status  . Hypertension, accelerated  . Diabetes mellitus  . CAD (coronary artery disease)   Past Medical History:  Past Medical History  Diagnosis Date  . CAD (coronary artery disease) 12/12/2011  . Hypertension, accelerated 12/12/2011  . Refusal of blood transfusions as patient is Jehovah's Witness   . Complication of anesthesia     "they have trouble waking me up"  . High cholesterol   . Anginal pain   . History of bronchitis   . Asthma 2006    "was hospitalized for this in [place]"  . Type II diabetes mellitus     "some say I'm type I"  . Headache   . Migraines   . Kidney stones     "several times"  . Arthritis   . Chronic lower back pain   . Claustrophobia   . Anxiety    Past Surgical History:  Past Surgical History  Procedure Date  . Kidney stone surgery   . Neuroplasty / transposition median nerve at carpal tunnel bilateral ~ 2006  . Coronary angioplasty with stent placement ~ 2004    "2"  . Breast biopsy     left  . Cataract extraction w/ intraocular lens implant 2012    left   HPI:   [name] is an 56 y.o. female history of diabetes mellitus, coronary artery disease, hypertension and hyperlipidemia, presenting with a history of confusion with fairly striking short-term memory difficulty. Patient asked same questions over and over despite being given the correct answers by family members. Symptoms lasted at least several hours. There was no focal weakness. Speech was not slurred. There is no previous history of similar symptoms. Patient has no memory for any events this morning and early afternoon today. She has not been on antiplatelet therapy. CT scan showed no acute intracranial abnormality. MRI showed  multiple small areas of restricted diffusion suggestive of acute infarctions of possible proximal embolic source since multiple vascular territories were involved.   Assessment / Plan / Recommendation Clinical Impression  Pt presents with a primary short term memory loss. Pt is able to utilize working memory for basic functional tasks, Long term memory also intact. Requires moderate verbal cues to recall newly learned information. Memory deficit could put pt at safety risk at d/c due to complexity of care. Recommend pt have full supervision from family at d/c if no physical deficits indicate need for higher level of care. Pt will also need f/u from outpatient SLP. Will continue to follow acutely for use of compensatory strategies for memory. Speech and language are intact. Cognition otherwise appears functional.     SLP Assessment  Patient needs continued Speech Lanaguage Pathology Services    Follow Up Recommendations  Outpatient SLP    Frequency and Duration min 2x/week  2 weeks   Pertinent Vitals/Pain NA   SLP Goals  SLP Goals Potential to Achieve Goals: Good Progress/Goals/Alternative treatment plan discussed with pt/caregiver and they: Agree SLP Goal #1: Pt will utilize compensatory strategy for memory with min verbal cues during higher level verbal task x3.  SLP Goal #2: Pt will verbalize intellecutal and anticipatory awareness of deficits independently prior to d/c.   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)   Cognition  Overall Cognitive Status: Impaired Arousal/Alertness: Awake/alert Orientation Level: Oriented to person;Oriented to place;Oriented to time Attention: Sustained;Selective Sustained Attention: Appears intact Selective Attention: Appears  intact Memory: Impaired Memory Impairment: Storage deficit;Retrieval deficit;Decreased recall of new information;Prospective memory Awareness: Impaired Awareness Impairment: Intellectual impairment;Anticipatory impairment Problem Solving: Appears intact Safety/Judgment: Impaired (safety impaired due to significant memory deficit) Comments: unable to recall three words without cues with MD. With SLP, with repetition for storage, pt retrieved 2/4 in 1 minute (4/4 with categroy cue). In 5 minutes, all four words. Required moderate verbal cues for prospective memory task and to recall basic information from MD 5 minutes later. Able to hold complex instructions in working memory to complete basic functional task. Shows improvement with cues and strategies. Good prognosis.     Comprehension  Auditory Comprehension Overall Auditory Comprehension: Appears within functional limits for tasks assessed Yes/No Questions: Within Functional Limits Commands: Within Functional Limits Conversation: Complex    Expression Verbal Expression Overall Verbal Expression: Appears within functional limits for tasks assessed Level of Generative/Spontaneous Verbalization: Conversation Naming: No impairment Pragmatics: No impairment   Oral / Motor Oral Motor/Sensory Function Overall Oral Motor/Sensory Function: Appears within functional limits for tasks assessed Motor Speech Overall Motor Speech: Appears within functional limits for tasks assessed     Kayela Humphres, Riley Nearing 12/13/2011, 9:28 AM

## 2011-12-13 NOTE — Procedures (Signed)
EEG NUMBER:  REFERRING PHYSICIAN:  Jeoffrey Massed, MD  HISTORY:  A 56 year old female with memory loss.  MEDICATIONS:  Aspirin, NovoLog, lisinopril, Senokot, Zocor.  CONDITIONS OF RECORDING:  This is a 16 channel EEG carried out with the patient in the awake, drowsy, and briefly asleep states.  DESCRIPTION:  The waking background activity consists of a low-voltage, symmetrical, fairly well-organized, 10 Hz alpha activity seen from the parieto-occipital and posterotemporal regions.  Low-voltage fast activity, poorly organized is seen anteriorly, and at times, superimposed on more posterior rhythms.  A mixture of theta and alpha was seen from the central and temporal regions.  The patient drowses with slowing to irregular, low-voltage theta and beta activity.  The patient goes into a light sleep with symmetrical sleep spindles and vertex was a sharp activity and irregular slow activity. Hyperventilation was performed and produced a mild-to-moderate buildup, but failed to elicit any abnormalities.  Intermittent photic stimulation was performed, but failed to elicit any change in the tracing.  IMPRESSION:  This is a normal EEG.          ______________________________ Thana Farr, MD    [id number] D:  12/13/2011 18:31:24  T:  12/13/2011 21:20:07  Job #:  [id number]

## 2011-12-14 ENCOUNTER — Encounter (HOSPITAL_COMMUNITY): Payer: Self-pay | Admitting: *Deleted

## 2011-12-14 ENCOUNTER — Encounter (HOSPITAL_COMMUNITY)
Admission: EM | Disposition: A | Discharge status: Home / Self Care | Payer: Self-pay | Source: Home / Self Care | Attending: Internal Medicine

## 2011-12-14 DIAGNOSIS — I6340 Cerebral infarction due to embolism of unspecified cerebral artery: Secondary | ICD-10-CM

## 2011-12-14 DIAGNOSIS — G454 Transient global amnesia: Secondary | ICD-10-CM

## 2011-12-14 DIAGNOSIS — I639 Cerebral infarction, unspecified: Secondary | ICD-10-CM | POA: Diagnosis present

## 2011-12-14 DIAGNOSIS — E1165 Type 2 diabetes mellitus with hyperglycemia: Secondary | ICD-10-CM

## 2011-12-14 DIAGNOSIS — I6529 Occlusion and stenosis of unspecified carotid artery: Secondary | ICD-10-CM

## 2011-12-14 DIAGNOSIS — I6789 Other cerebrovascular disease: Secondary | ICD-10-CM

## 2011-12-14 DIAGNOSIS — G459 Transient cerebral ischemic attack, unspecified: Secondary | ICD-10-CM

## 2011-12-14 DIAGNOSIS — E118 Type 2 diabetes mellitus with unspecified complications: Secondary | ICD-10-CM

## 2011-12-14 DIAGNOSIS — I63239 Cerebral infarction due to unspecified occlusion or stenosis of unspecified carotid arteries: Secondary | ICD-10-CM

## 2011-12-14 DIAGNOSIS — I10 Essential (primary) hypertension: Secondary | ICD-10-CM

## 2011-12-14 DIAGNOSIS — R4182 Altered mental status, unspecified: Secondary | ICD-10-CM

## 2011-12-14 HISTORY — PX: TEE WITHOUT CARDIOVERSION: SHX5443

## 2011-12-14 LAB — PROTEIN C ACTIVITY: Protein C Activity: 166 % — ABNORMAL HIGH (ref 75–133)

## 2011-12-14 LAB — GLUCOSE, CAPILLARY
Glucose-Capillary: 283 mg/dL — ABNORMAL HIGH (ref 70–99)
Glucose-Capillary: 213 mg/dL — ABNORMAL HIGH (ref 70–99)

## 2011-12-14 LAB — C4 COMPLEMENT: Complement C4, Body Fluid: 44 mg/dL — ABNORMAL HIGH (ref 10–40)

## 2011-12-14 LAB — RPR: RPR Ser Ql: NONREACTIVE

## 2011-12-14 LAB — LUPUS ANTICOAGULANT PANEL: Lupus Anticoagulant: NOT DETECTED

## 2011-12-14 LAB — CARDIOLIPIN ANTIBODIES, IGG, IGM, IGA
Anticardiolipin IgG: 9 GPL U/mL — ABNORMAL LOW (ref <23)
Anticardiolipin IgM: 6 MPL U/mL — ABNORMAL LOW (ref <11)

## 2011-12-14 LAB — PROTEIN C, TOTAL: Protein C, Total: 90 % (ref 72–160)

## 2011-12-14 LAB — HOMOCYSTEINE: Homocysteine: 11.9 umol/L (ref 4.0–15.4)

## 2011-12-14 LAB — PROTEIN S ACTIVITY: Protein S Activity: 146 % — ABNORMAL HIGH (ref 69–129)

## 2011-12-14 SURGERY — ECHOCARDIOGRAM, TRANSESOPHAGEAL
Anesthesia: Moderate Sedation

## 2011-12-14 MED ORDER — MIDAZOLAM HCL 10 MG/2ML IJ SOLN
INTRAMUSCULAR | Status: DC | PRN
  Administered 2011-12-14: 2 mg via INTRAVENOUS

## 2011-12-14 MED ORDER — LISINOPRIL 10 MG PO TABS: 10.0000 mg | ORAL_TABLET | Freq: Every day | ORAL | Status: DC

## 2011-12-14 MED ORDER — FENTANYL CITRATE 0.05 MG/ML IJ SOLN
INTRAMUSCULAR | Status: DC | PRN
  Administered 2011-12-14: 25 ug via INTRAVENOUS

## 2011-12-14 MED ORDER — CLOPIDOGREL BISULFATE 75 MG PO TABS: 75.0000 mg | ORAL_TABLET | Freq: Every day | ORAL | Status: DC

## 2011-12-14 MED ORDER — BUTAMBEN-TETRACAINE-BENZOCAINE 2-2-14 % EX AERO
INHALATION_SPRAY | CUTANEOUS | Status: DC | PRN
  Administered 2011-12-14: 2 via TOPICAL

## 2011-12-14 MED ORDER — INSULIN ASPART PROT & ASPART (70-30 MIX) 100 UNIT/ML ~~LOC~~ SUSP: 18.0000 [IU] | Freq: Two times a day (BID) | SUBCUTANEOUS | Status: DC

## 2011-12-14 MED ORDER — PRAVASTATIN SODIUM 20 MG PO TABS: 20.0000 mg | ORAL_TABLET | Freq: Every day | ORAL | Status: DC

## 2011-12-14 MED ORDER — INSULIN ASPART PROT & ASPART (70-30 MIX) 100 UNIT/ML ~~LOC~~ SUSP
18.0000 [IU] | Freq: Two times a day (BID) | SUBCUTANEOUS | Status: DC
  Administered 2011-12-14: 18 [IU] via SUBCUTANEOUS
  Filled 2011-12-14 (×2): qty 3

## 2011-12-14 MED ORDER — FENTANYL CITRATE 0.05 MG/ML IJ SOLN
INTRAMUSCULAR | Status: AC
  Filled 2011-12-14: qty 2

## 2011-12-14 MED ORDER — MIDAZOLAM HCL 10 MG/2ML IJ SOLN
INTRAMUSCULAR | Status: AC
  Filled 2011-12-14: qty 2

## 2011-12-14 NOTE — Progress Notes (Signed)
Pt discharged to home with husband and son. All prescriptions reviewed and follow up appointments clarified. Elmer Sow, Rn

## 2011-12-14 NOTE — H&P (View-Only) (Signed)
Subjective:   Chart reviewed. Patient has mild headache but denies dizziness or lightheadedness. She has minimal recollection of events since night before last. Denies slurred speech, asymmetric limb weakness or facial asymmetry.  Patient does not have a PCP. Patient spouse was laid off approximately 6 months ago and do not have insurance.  Objective  Vital signs in last 24 hours: Filed Vitals:   12/13/11 0149 12/13/11 0155 12/13/11 0637 12/13/11 1400  BP: 173/111 170/92 173/76 135/66  Pulse: 85 81 79 77  Temp: 99 F (37.2 C)  98.8 F (37.1 C)   TempSrc: Oral  Oral Oral  Resp: 18  18 18  Height:    5' 6" (1.676 m)  Weight:    81.647 kg (180 lb)  SpO2: 95%  93% 97%   Weight change:   Intake/Output Summary (Last 24 hours) at 12/13/11 1553 Last data filed at 12/13/11 1200  Gross per 24 hour  Intake    360 ml  Output      0 ml  Net    360 ml    Physical Exam:  General Exam: Comfortable.  Respiratory System: Clear. No increased work of breathing.  Cardiovascular System: First and second heart sounds heard. Regular rate and rhythm. No JVD/murmurs. Telemetry shows normal sinus rhythm without any arrhythmia alarms.  Gastrointestinal System: Abdomen is non distended, soft and normal bowel sounds heard. Nontender. Central Nervous System: Alert and oriented. No focal neurological deficits. Short term memory impairment. Extremities: Symmetric 5 x 5 power.  Labs:  Basic Metabolic Panel:  Lab 12/13/11 0710 12/12/11 1646 12/12/11 0932  NA 139 -- 146*  K 4.0 -- 4.4  CL 102 -- 103  CO2 25 -- --  GLUCOSE 302* -- 266*  BUN 12 -- 13  CREATININE 0.73 0.77 0.80  CALCIUM 9.8 -- --  ALB -- -- --  PHOS -- -- --   Liver Function Tests:  Lab 12/13/11 0710  AST 14  ALT 9  ALKPHOS 108  BILITOT 0.4  PROT 6.9  ALBUMIN 3.4*   No results found for this basename: LIPASE:3,AMYLASE:3 in the last 168 hours No results found for this basename: AMMONIA:3 in the last 168 hours CBC:  Lab  12/13/11 0710 12/12/11 1646 12/12/11 1127  WBC 7.5 6.6 7.6  NEUTROABS -- -- 5.1  HGB 11.8* 11.6* 12.3  HCT 35.3* 33.8* 35.8*  MCV 82.9 82.2 82.7  PLT 393 369 403*   Cardiac Enzymes: No results found for this basename: CKTOTAL:5,CKMB:5,CKMBINDEX:5,TROPONINI:5 in the last 168 hours CBG:  Lab 12/13/11 1205 12/13/11 0709 12/12/11 2151 12/12/11 1630 12/12/11 0854  GLUCAP 314* 285* 286* 270* 256*   Other lab data:  1. Lipid panel: Cholesterol 204, triglycerides 126, HDL 44, LDL 135, VLDL 25. 2. ESR: 66 3. Antithrombin III function: 103 4. Hemoglobin A1c: 11.6 5. Urine drug screen: Negative   Iron Studies: No results found for this basename: IRON,TIBC,TRANSFERRIN,FERRITIN in the last 72 hours  Studies/Results:  CT of the brain 12/12/2011 Question small vessel chronic ischemic changes of deep cerebral white matter. No acute intracranial abnormalities.  .  MRI of the brain 12/12/2011 Multiple small areas of restricted diffusion suggestive of acute infarct. If these are infarction, they are likely embolic as they involve multiple vascular distributions. Normal enhancement pattern. No mass lesion.   MRA of the brain 12/12/2011 Probable atherosclerotic disease involving the left distal vertebral artery and left posterior cerebral artery. No large vessel occlusion.   MRA of the neck 12/12/2011 : Critical stenosis   proximal right internal carotid artery, estimated to be greater than 85% diameter stenosis. There is also a moderate to severe stenosis of the origin of the right external carotid artery. Approximately 40% diameter stenosis of the proximal left internal carotid artery.   2D Echocardiogram EF 50-55% with no source of embolus.   Carotid Doppler See MRA of the neck   CXR 12/12/2011 Minimal bronchitic changes and right middle lobe scarring.   EKG normal sinus rhythm.   Medications:    . DISCONTD: labetalol (NORMODYNE) infusion Stopped (12/12/11 1403)      . aspirin  325 mg Oral  Daily  . enoxaparin  40 mg Subcutaneous Q24H  . insulin aspart  0-9 Units Subcutaneous TID WC  . lisinopril  5 mg Oral Daily  . senna  1 tablet Oral BID  . simvastatin  10 mg Oral q1800  . sodium chloride  3 mL Intravenous Q12H  . sodium chloride  3 mL Intravenous Q12H  . DISCONTD: sodium chloride   Intravenous STAT  . DISCONTD: hydrochlorothiazide  12.5 mg Oral Daily    I  have reviewed scheduled and prn medications.     Problem/Plan: Principal Problem:  *Altered mental status Active Problems:  Hypertension, accelerated  Diabetes mellitus  CAD (coronary artery disease)  1. 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 Working up for embolic source. Continue aspirin for secondary stroke prevention. Continue simvastatin. Outpatient heart monitor on discharge to rule out A. fib. 2. Periods of confusion and memory loss:? Secondary to multiple CVAs versus rule out seizures. Checking EEG. 3. Uncontrolled type 2 diabetes: Start Lantus and continue sliding scale insulin. Hemoglobin A1c: 11.6. 4. Hypertension: Continue low dose lisinopril. Above for permissive hypertension secondary to recent stroke. 5. ? Right ICA and ECA stenosis: Await further recommendations by neurology. Continue aspirin. 6. Hyperlipidemia: Statin started. 7. Anemia: Probably chronic and unclear etiology. Outpatient evaluation. 8. History of coronary artery disease status post PCI: Asymptomatic of chest pain. Continue aspirin. 9. Full code.  Discussed at length with patient and patient's spouse at the bedside and updated care and answered questions.  [name] 12/13/2011,3:53 PM  LOS: 1 day   

## 2011-12-14 NOTE — Discharge Summary (Addendum)
Discharge Summary  [name] MR#: [id number]  DOB:May 31, 1956  Date of Admission: 12/12/2011 Date of Discharge: 12/14/2011  Patient's PCP: No primary provider on file.  Attending Physician:Prisca Gearing  Consults: 1. Vascular Surgery: Nada Libman, MD 2. Neurology: Dr.Pramodkumar Pearlean Brownie & Dr. Lesia Sago.  Discharge Diagnoses: Principal Problem:  *CVA (cerebral infarction) Active Problems:  Altered mental status  Hypertension, accelerated  Diabetes mellitus  CAD (coronary artery disease)   Brief Admitting History and Physical 56 y.o. female history of diabetes mellitus, coronary artery disease, hypertension and hyperlipidemia, presented on 12/12/2011 with a history of confusion with fairly striking short-term memory difficulty. Patient asked same questions over and over despite being given the correct answers by family members. Symptoms lasted at least several hours. There was no focal weakness. Speech was not slurred. There was no previous history of similar symptoms.Patient had no memory of these symptoms. She has not been on antiplatelet therapy. She was admitted for further evaluation and Mx.    Discharge Medications Current Discharge Medication List    START taking these medications   Details  clopidogrel (PLAVIX) 75 MG tablet Take 1 tablet (75 mg total) by mouth daily. Qty: 30 tablet, Refills: 0    insulin aspart protamine-insulin aspart (NOVOLOG 70/30) (70-30) 100 UNIT/ML injection Inject 18 Units into the skin 2 (two) times daily with a meal. Qty: 10 mL, Refills: 0    lisinopril (PRINIVIL,ZESTRIL) 10 MG tablet Take 1 tablet (10 mg total) by mouth daily. Qty: 30 tablet, Refills: 0    pravastatin (PRAVACHOL) 20 MG tablet Take 1 tablet (20 mg total) by mouth daily. Qty: 30 tablet, Refills: 0      STOP taking these medications     insulin aspart (NOVOLOG) 100 UNIT/ML injection Comments:  Reason for Stopping:          Hospital Course: CVA (cerebral  infarction) Present on Admission:  .Altered mental status .Hypertension, accelerated .Diabetes mellitus .CAD (coronary artery disease) .CVA (cerebral infarction)   1. CVA: Multiple small infarcts in multiple vascular distributions: Neurology consulted. Extensive evaluation was done including MRI, MRA of brain, carotid Dopplers, TEE to rule out embolic source and blood work. Initially patient was started on full dose aspirin. However cardiology recommended Plavix instead of aspirin (for secondary stroke prevention) given abnormal TEE findings. This was discussed with neurology who agreed that Plavix would be a better choice given history of bilateral ICA stenosis. Patient still has minimal recollection of events prior to hospitalization. She and spouse have been extensively counseled regarding controlling risk factors such as diabetes, hypertension, hyperlipidemia. They verbalized understanding. Patient will follow up with neurology in 2 months from discharge. Patient's CVA may be from calcific embolization from the heart. Blood cultures have been drawn to rule out endocarditis although less likely. [facility] will arrange for heart monitor outpatient. 2. Periods of confusion and memory loss:? Secondary to multiple CVAs : Confusion seems to have resolved but still has minimal recollection of events prior to hospitalization. 3. Uncontrolled type 2 diabetes: At one time patient used to be on Lantus and NovoLog. After their insurance ran out, she was taking 70/30 insulin from [place] but has not been on any medication for approximately 2 months. Patient was prescribed 70/30 insulin which she can purchase at a reasonable price at [employer] [facility] [employer]. She has been counseled regarding compliance with medications. Hemoglobin A1c: 11.6. 4. Hypertension: Lisinopril increased to 10 mg by mouth daily. Her medications are to be titrated as an outpatient as deemed necessary. 5. Bilateral internal  carotid artery stenosis  right greater than left. Severe right carotid artery stenosis which is not thought to be the cause for her current bilateral CVAs. Vascular surgery was consulted and recommended further evaluation as outpatient. Continue Plavix. 6. Hyperlipidemia: The patient's simvastatin was changed to pravastatin on discharge which she can purchase at $ 4 at [facility]. 7. Anemia: Probably chronic and unclear etiology. Outpatient evaluation: Should include GI consultation for screening colonoscopy-was discussed with the patient. 8. History of coronary artery disease status post PCI: Asymptomatic of chest pain. Continue Plavix. 9. Full code.  Day of Discharge  Complaints: Denies complaints.  Physical Exam: BP 165/102  Pulse 86  Temp 97.8 F (36.6 C) (Oral)  Resp 54  Ht 5\' 6"  (1.676 m)  Wt 81.647 kg (180 lb)  BMI 29.05 kg/m2  SpO2 100%  General Exam: Comfortable.  Respiratory System: Clear. No increased work of breathing.  Cardiovascular System: First and second heart sounds heard. Regular rate and rhythm. No JVD/murmurs. Telemetry shows normal sinus rhythm without any arrhythmia alarms.  Gastrointestinal System: Abdomen is non distended, soft and normal bowel sounds heard. Nontender.  Central Nervous System: Alert and oriented. No focal neurological deficits. Short term memory impairment.  Extremities: Symmetric 5 x 5 power  Basic Metabolic Panel:  Lab 12/13/11 1610 12/12/11 1646 12/12/11 0932  NA 139 -- 146*  K 4.0 -- 4.4  CL 102 -- 103  CO2 25 -- --  GLUCOSE 302* -- 266*  BUN 12 -- 13  CREATININE 0.73 0.77 0.80  CALCIUM 9.8 -- --  ALB -- -- --  PHOS -- -- --   Liver Function Tests:  Lab 12/13/11 0710  AST 14  ALT 9  ALKPHOS 108  BILITOT 0.4  PROT 6.9  ALBUMIN 3.4*   No results found for this basename: LIPASE:3,AMYLASE:3 in the last 168 hours No results found for this basename: AMMONIA:3 in the last 168 hours CBC:  Lab 12/13/11 0710 12/12/11 1646 12/12/11 1127    WBC 7.5 6.6 7.6  NEUTROABS -- -- 5.1  HGB 11.8* 11.6* 12.3  HCT 35.3* 33.8* 35.8*  MCV 82.9 82.2 82.7  PLT 393 369 403*   Cardiac Enzymes: No results found for this basename: CKTOTAL:5,CKMB:5,CKMBINDEX:5,TROPONINI:5 in the last 168 hours CBG:  Lab 12/14/11 1637 12/14/11 1520 12/14/11 1325 12/14/11 1130 12/13/11 2123  GLUCAP 213* 198* 241* 283* 180*   Other lab data:  Lipid panel: Cholesterol 204, triglycerides 126, HDL 44, LDL 135, VLDL 25. ESR: 66 Antithrombin III function: 103 Hemoglobin A1c: 11.6 Urine drug screen: Negative Homocysteine: 11.9. Lupus anticoagulant: None detected Protein C& S. activity increased: Unclear significance. RPR: Nonreactive. Urinalysis: Negative for features of urinary tract infection. 30 g/dL of protein. UDS: Negative. Blood cultures x2: No growth to date.       Studies/Results:   CT of the brain 12/12/2011 Question small vessel chronic ischemic changes of deep cerebral white matter. No acute intracranial abnormalities.  Marland Kitchen  MRI of the brain 12/12/2011 Multiple small areas of restricted diffusion suggestive of acute infarct. If these are infarction, they are likely embolic as they involve multiple vascular distributions. Normal enhancement pattern. No mass lesion.   MRA of the brain 12/12/2011 Probable atherosclerotic disease involving the left distal vertebral artery and left posterior cerebral artery. No large vessel occlusion.   MRA of the neck 12/12/2011 : Critical stenosis proximal right internal carotid artery, estimated to be greater than 85% diameter stenosis. There is also a moderate to severe stenosis of the origin  of the right external carotid artery. Approximately 40% diameter stenosis of the proximal left internal carotid artery.   2D Echocardiogram EF 50-55% with no source of embolus.   Carotid Doppler See MRA of the neck   CXR 12/12/2011 Minimal bronchitic changes and right middle lobe scarring.   EKG normal sinus rhythm.    TEE Please see echo section of chart for full details. In short, normal LV systolic function with EF 55-60%. Mild LVH. No LA appendage thrombus. Mild plaque in thoracic aorta. There was a small (< 1 cm) calcified round mobile structure attached to the mitral valve annulus. There was heavy mitral annular calcification. Possibly, this structure is part of the mitral annular calcification. I do not think it represents active endocarditis, though it could alternatively be an old calcified endocarditis lesion. Given the structure's mobility, the cardiologist was concerned that it could increase the risk of a stroke via calcific embolism. There are reports of increased risk of stroke with heavy mitral annular calcification.   Disposition: Discharged home in stable condition.  Diet: Diabetic and Heart Healthy.  Activity: Increase activity gradually.   Follow-up Appts: Discharge Orders    Future Orders Please Complete By Expires   Diet - low sodium heart healthy      Diet Carb Modified      Increase activity slowly      Call MD for:      Comments:   Speech difficulties.   Call MD for:  persistant dizziness or light-headedness         TESTS THAT NEED FOLLOW-UP Blood culture results.  Time spent on discharge, talking to the patient, and coordinating care: 40 mins.   SignedMarcellus Scott, MD 12/15/2011, 3:44 PM

## 2011-12-14 NOTE — Progress Notes (Signed)
Inpatient Diabetes Program Recommendations  AACE/ADA: New Consensus Statement on Inpatient Glycemic Control (2009)  Target Ranges:  Prepandial:   less than 140 mg/dL      Peak postprandial:   less than 180 mg/dL (1-2 hours)      Critically ill patients:  140 - 180 mg/dL   Reason for GNFAO:07/24/84 CBGs 302-314-321-180 ng/dl      Inpatient Diabetes Program Recommendations Insulin - Basal: .Increase Lantus dose to 20 units daily if CBGs continue greater than 180 mg/dl  Note:

## 2011-12-14 NOTE — Progress Notes (Signed)
  Echocardiogram Echocardiogram Transesophageal has been performed.  Harol Shabazz FRANCES 12/14/2011, 3:01 PM

## 2011-12-14 NOTE — Procedures (Addendum)
Procedure: TEE  Indication: CVA  Sedation: 4 mg IV Versed, 50 mcg IV Fentanyl  Findings: Please see echo section of chart for full details.  In short, normal LV systolic function with EF 55-60%.  Mild LVH.  No LA appendage thrombus.  Mild plaque in thoracic aorta.  There was a small (< 1 cm) calcified round mobile structure attached to the mitral valve annulus.  There was heavy mitral annular calcification.  Possibly, this structure is part of the mitral annular calcification.  I do not think it represents active endocarditis, though it could alternatively be an old calcified endocarditis lesion.  Given the structure's mobility, I would be concerned that it could increase the risk of a stroke via calcific embolism.  There are reports of increased risk of stroke with heavy mitral annular calcification.  I suspect antiplatelet treatment with Plavix is going to be the best plan here.  I will order a set of blood cultures just in case.   No complications.   [name] 12/14/2011 2:41 PM

## 2011-12-14 NOTE — Progress Notes (Signed)
Stroke Team Progress Note  HISTORY [name] is an 57 y.o. female history of diabetes mellitus, coronary artery disease, hypertension and hyperlipidemia, presenting 12/12/2011 with a history of confusion with fairly striking short-term memory difficulty. Patient asked same questions over and over despite being given the correct answers by family members. Symptoms lasted at least several hours. There was no focal weakness. Speech was not slurred. There is no previous history of similar symptoms. Patient has no memory for any events this morning and early afternoon today. She has not been on antiplatelet therapy. CT scan showed no acute intracranial abnormality. MRI showed multiple small areas of restricted diffusion suggestive of acute infarctions of possible proximal embolic source since multiple vascular territories were involved. MRA of the head showed no significant stenotic or occlusive lesion. MRA of the neck showed high-grade right internal carotid artery stenosis as well as moderate stenosis at the origin of the right external carotid artery. Patient was not a TPA candidate secondary to presenting outside the TPA window. She was admitted for further evaluation and treatment.  SUBJECTIVE No family is at the bedside.  Patient reports continued mild headache with new purple spot floating in her left visual field. No further issues with memory since yesterday morning. She totally does not remember  OBJECTIVE Most recent Vital Signs: Filed Vitals:   12/13/11 1758 12/13/11 2119 12/14/11 0200 12/14/11 0556  BP: 133/70 159/88 155/89 156/84  Pulse: 83 80 84 86  Temp: 98.2 F (36.8 C) 99.6 F (37.6 C)  98.8 F (37.1 C)  TempSrc: Oral Oral Oral Oral  Resp: 18 18 18 18   Height:      Weight:      SpO2: 100% 96% 94% 94%   CBG (last 3)   Basename 12/13/11 2123 12/13/11 1607 12/13/11 1205  GLUCAP 180* 321* 314*   Intake/Output from previous day:06/20 0701 - 06/21 0700 In: 800 [P.O.:800] Out:  -   IV Fluid Intake:    MEDICATIONS     . aspirin  325 mg Oral Daily  . enoxaparin  40 mg Subcutaneous Q24H  . insulin aspart  0-15 Units Subcutaneous TID WC  . insulin aspart  0-5 Units Subcutaneous QHS  . insulin glargine  15 Units Subcutaneous QHS  . lisinopril  5 mg Oral Daily  . senna  1 tablet Oral BID  . simvastatin  10 mg Oral q1800  . sodium chloride  3 mL Intravenous Q12H  . sodium chloride  3 mL Intravenous Q12H  . DISCONTD: insulin aspart  0-9 Units Subcutaneous TID WC   PRN:  sodium chloride, acetaminophen, acetaminophen, HYDROcodone-acetaminophen, labetalol, ondansetron (ZOFRAN) IV, ondansetron, sodium chloride  Diet:  NPO thin liquids Activity:  Up with assistance DVT Prophylaxis:  Lovenox 40 mg sq daily   CLINICALLY SIGNIFICANT STUDIES Basic Metabolic Panel:  Lab 12/13/11 9381 12/12/11 1646 12/12/11 0932  NA 139 -- 146*  K 4.0 -- 4.4  CL 102 -- 103  CO2 25 -- --  GLUCOSE 302* -- 266*  BUN 12 -- 13  CREATININE 0.73 0.77 --  CALCIUM 9.8 -- --  MG -- -- --  PHOS -- -- --   CBC:  Lab 12/13/11 0710 12/12/11 1646 12/12/11 1127  WBC 7.5 6.6 --  NEUTROABS -- -- 5.1  HGB 11.8* 11.6* --  HCT 35.3* 33.8* --  MCV 82.9 82.2 --  PLT 393 369 --   Urinalysis:  Lab 12/12/11 0936  COLORURINE YELLOW  LABSPEC 1.012  PHURINE 7.0  GLUCOSEU 500*  HGBUR NEGATIVE  BILIRUBINUR NEGATIVE  KETONESUR NEGATIVE  PROTEINUR 30*  UROBILINOGEN 0.2  NITRITE NEGATIVE  LEUKOCYTESUR NEGATIVE   Lipid Panel    Component Value Date/Time   CHOL 204* 12/13/2011 0710   TRIG 126 12/13/2011 0710   HDL 44 12/13/2011 0710   CHOLHDL 4.6 12/13/2011 0710   VLDL 25 12/13/2011 0710   LDLCALC 135* 12/13/2011 0710   HgbA1C  Lab Results  Component Value Date   HGBA1C 11.6* 12/12/2011    Urine Drug Screen:     Component Value Date/Time   LABOPIA NONE DETECTED 12/12/2011 0936   COCAINSCRNUR NONE DETECTED 12/12/2011 0936   LABBENZ NONE DETECTED 12/12/2011 0936   AMPHETMU NONE DETECTED  12/12/2011 0936   THCU NONE DETECTED 12/12/2011 0936   LABBARB NONE DETECTED 12/12/2011 0936    Alcohol Level: No results found for this basename: ETH:2 in the last 168 hours  Results for orders placed during the hospital encounter of 12/12/11 (from the past 24 hour(s))  ANTITHROMBIN III     Status: Normal   Collection Time   12/13/11 11:42 AM      Component Value Range   AntiThromb III Func 103  75 - 120 %  HOMOCYSTEINE, SERUM     Status: Normal   Collection Time   12/13/11 11:42 AM      Component Value Range   Homocysteine-Norm 11.9  4.0 - 15.4 umol/L  C4 COMPLEMENT     Status: Abnormal   Collection Time   12/13/11 11:42 AM      Component Value Range   Complement C4, Body Fluid 44 (*) 10 - 40 mg/dL  C3 COMPLEMENT     Status: Normal   Collection Time   12/13/11 11:42 AM      Component Value Range   C3 Complement 161  90 - 180 mg/dL  RPR     Status: Normal   Collection Time   12/13/11 11:42 AM      Component Value Range   RPR NON REACTIVE  NON REACTIVE  SEDIMENTATION RATE     Status: Abnormal   Collection Time   12/13/11 11:42 AM      Component Value Range   Sed Rate 66 (*) 0 - 22 mm/hr  GLUCOSE, CAPILLARY     Status: Abnormal   Collection Time   12/13/11 12:05 PM      Component Value Range   Glucose-Capillary 314 (*) 70 - 99 mg/dL  GLUCOSE, CAPILLARY     Status: Abnormal   Collection Time   12/13/11  4:07 PM      Component Value Range   Glucose-Capillary 321 (*) 70 - 99 mg/dL  GLUCOSE, CAPILLARY     Status: Abnormal   Collection Time   12/13/11  9:23 PM      Component Value Range   Glucose-Capillary 180 (*) 70 - 99 mg/dL   Comment 1 Notify RN     Comment 2 Documented in Chart     CT of the brain  12/12/2011  Question small vessel chronic ischemic changes of deep cerebral white matter. No acute intracranial abnormalities.  Marland Kitchen  MRI of the brain  12/12/2011  Multiple small areas of restricted diffusion suggestive of acute infarct.  If these are  infarction, they are likely  embolic as they involve multiple vascular distributions.  Normal enhancement pattern.  No mass lesion.   MRA of the brain  12/12/2011   Probable atherosclerotic disease involving the left distal vertebral artery and left posterior  cerebral artery.  No large vessel occlusion.    MRA of the neck 12/12/2011 : Critical stenosis proximal right internal carotid artery, estimated to be greater than 85% diameter stenosis.  There is also a moderate to severe stenosis of the origin of the right external carotid artery.  Approximately 40% diameter stenosis of the proximal left internal carotid artery.    2D Echocardiogram  EF 50-55% with no source of embolus.   Carotid Doppler  L ICA > 80%, R ICA 80% stenosis  CXR  12/12/2011   Minimal bronchitic changes and right middle lobe scarring.   EKG  normal sinus rhythm.   EEG This is a normal EEG.  Therapy Recommendations PT -none, OT -none  Physical Exam  Pleasant middle aged Caucasian lady not in distress.Awake alert. Neurological Exam : Awake  Alert oriented x 3.Diminished recall 0/3 but normal attention and registration. Normal speech and language.eye movements full without nystagmus.Normal visual acuity and fields. Fundi not visualized. Face symmetric. Normal strength, tone, reflexes and coordination.  Gait deferred.  No pronator drift.   ASSESSMENT Ms. [name] is a 56 y.o. female with a multiple small infarcts in multiple vascular distributions in setting of clinical transient gloabl amnesia. Infarcts potentially silent due to small vessel disease vs embolic etiology. Patient does have significant bilat ICA stenosis; stenosis does not explain etiology of strokes. Does need VVS consult to address. On no antiplatelets prior to admission. Now on aspirin 325 mg orally every day for secondary stroke prevention.   -L > R ICA, both 80% stenosis -diabetes, HgbA1c 11.6 -dyslipidemia, LDL 135 -CAD -hypertension -headache/migraine hx  Hospital day #  2  TREATMENT/PLAN -Continue aspirin 325 mg orally every day for secondary stroke prevention. -VVS consult for CEA in the near future -may travel off tele -TEE this afternoon to look for embolic source. If positive for PFO (patent foramen ovale), check bilateral lower extremity venous dopplers to rule out DVT as possible source of stroke. I arranged with [facility] -risk factor control by primary MD, which pt needs to obtain -will need outpatient telemetry monitoring to assess patient for atrial fibrillation as source of stroke if TEE negatvie. May be arranged with patient's cardiologist, or cardiologist of choice.  -follow up hypercoagulable, vasculitic labs -ok for discharge after TEE if pt stable from neuro standpoint  SHARON BIBY, AVNP, ANP-BC, GNP-BC [facility] Pager: [phone] 12/14/2011 9:57 AM  Dr.Keith Anne Hahn has personally reviewed chart, pertinent data, examined the patient and developed the plan of care. Pager:  [phone]   Lesly Dukes

## 2011-12-14 NOTE — Progress Notes (Signed)
VASCULAR LAB PRELIMINARY  PRELIMINARY  PRELIMINARY  PRELIMINARY  Carotid duplex  completed.    Preliminary report:  Right:  60-79% ICA stenosis.  Acoustic shadowing from calcific plaque may obscure higher velocities.  ECA stenosis.  Vertebral artery flow antegrade.  Left:  Greater than 80% ICA stenosis.  Diffuse irregular mixed plaque proximal to mid ICA.  Vertebral artery flow antegrade.  [name], RVT 12/14/2011, 9:38 AM

## 2011-12-14 NOTE — Interval H&P Note (Signed)
History and Physical Interval Note:  12/14/2011 2:14 PM  [name]  has presented today for surgery, with the diagnosis of stroke  The various methods of treatment have been discussed with the patient and family. After consideration of risks, benefits and other options for treatment, the patient has consented to  Procedure(s) (LRB): TRANSESOPHAGEAL ECHOCARDIOGRAM (TEE) (N/A) as a surgical intervention .  The patient's history has been reviewed, patient examined, no change in status, stable for surgery.  I have reviewed the patients' chart and labs.  Questions were answered to the patient's satisfaction.     [name] [employer]

## 2011-12-14 NOTE — Progress Notes (Signed)
Speech Language Pathology Treatment Patient Details Name: [name] MRN: [id number] DOB: September 21, 1955 Today's Date: 12/14/2011 Time: 8657-8469 SLP Time Calculation (min): 15 min  Assessment / Plan / Recommendation Clinical Impression  Treatment focused on education of memory compensatory strategies for return to home (either today or tomorrow).  Patient tearful at times, appropriately related to her not recalling the events of having a stroke, only learning details from her family. Currently, patient demonstrates functional improvements in memory as compared to 6/20 testing.  Reviewed strategies of a daily routine, written reminders and utilizing her family to verify her money & medication management.  No follow-up recommended at this time given patient's rate of recovery.     Pertinent Vitals/Pain n/a  SLP Goals  SLP Goals SLP Goal #1 - Progress: Met SLP Goal #2 - Progress: Met  Treatment Treatment focused on: Cognition   Myra Rude, M.S.,CCC-SLP Pager [phone] 12/14/2011, 10:49 AM

## 2011-12-14 NOTE — Consult Note (Signed)
[facility] [place] CONSULT NOTE 12/14/2011 DOB: 08/19/1955 MRN : [id number]  CC:Carotid stenosis s/p TIA   History of Present Illness: [name] is an 56 y.o. female history of diabetes mellitus, coronary artery disease, hypertension and hyperlipidemia, presenting 12/12/2011 with a history of confusion with fairly striking short-term memory difficulty. Patient asked same questions over and over despite being given the correct answers by family members. Symptoms lasted at least several hours. There was no focal weakness. Speech was not slurred. There is no previous history of similar symptoms. Patient has no memory for any events this morning and early afternoon today. She has not been on antiplatelet therapy. CT scan showed no acute intracranial abnormality. MRI showed multiple small areas of restricted diffusion suggestive of acute infarctions of possible proximal embolic source since multiple vascular territories were involved. MRA of the head showed no significant stenotic or occlusive lesion. MRA of the neck showed high-grade right internal carotid artery stenosis as well as moderate stenosis at the origin of the right external carotid artery. Patient was not a TPA candidate secondary to presenting outside the TPA window. She was admitted for further evaluation and treatment.  She has a history of coronary disease with stent placement.  She is medically managed for her hypertension.  She is a former smoker.   Past Medical History  Diagnosis Date  . CAD (coronary artery disease) 12/12/2011  . Hypertension, accelerated 12/12/2011  . Refusal of blood transfusions as patient is Jehovah's Witness   . Complication of anesthesia     "they have trouble waking me up"  . High cholesterol   . Anginal pain   . History of bronchitis   . Asthma 2006    "was hospitalized for this in [place]"  . Type II diabetes mellitus     "some say I'm type I"  . Headache   . Migraines   . Kidney  stones     "several times"  . Arthritis   . Chronic lower back pain   . Claustrophobia   . Anxiety     Past Surgical History  Procedure Date  . Kidney stone surgery   . Neuroplasty / transposition median nerve at carpal tunnel bilateral ~ 2006  . Coronary angioplasty with stent placement ~ 2004    "2"  . Breast biopsy     left  . Cataract extraction w/ intraocular lens implant 2012    left     ROS: [x] Positive  [ ] Denies    General: [ ] Weight loss, [ ] Fever, [ ] chills Neurologic: [ ] Dizziness, [ ] Blackouts, [ ] Seizure [ ] Stroke, [ ] "Mini stroke", [ ] Slurred speech, [ ] Temporary blindness; [ ] weakness in arms or legs, [ ] Hoarseness Cardiac: [ ] Chest pain/pressure, [ ] Shortness of breath at rest [ ] Shortness of breath with exertion, [ ] Atrial fibrillation or irregular heartbeat Vascular: [ ] Pain in legs with walking, [ ] Pain in legs at rest, [ ] Pain in legs at night,  [ ] Non-healing ulcer, [ ] Blood clot in vein/DVT,   Pulmonary: [ ] Home oxygen, [ ] Productive cough, [ ] Coughing up blood, [x ] Asthma,  [ ] Wheezing Musculoskeletal:  [ ] Arthritis, [ ] Low back pain, [ ] Joint pain Hematologic: [ ] Easy Bruising, [ ] Anemia; [ ] Hepatitis Gastrointestinal: [ ] Blood in stool, [ ] Gastroesophageal Reflux/heartburn, [ ] Trouble swallowing Urinary: [ ]   chronic Kidney disease, [ ] on HD - [ ] MWF or [ ] TTHS, [ ] Burning with urination, [ ] Difficulty urinating Skin: [ ] Rashes, [ ] Wounds Psychological: [ ] Anxiety, [ ] Depression  Social History History  Substance Use Topics  . Smoking status: Former Smoker -- 1.0 packs/day for 3 years    Types: Cigarettes    Quit date: 06/26/1975  . Smokeless tobacco: Never Used  . Alcohol Use: Yes     12/12/11 "occasionally drink whiskey; maybe 1/4 of a drink/wk average"    Family History History reviewed. No pertinent family history.  Allergies  Allergen Reactions  . Penicillins Itching and Other (See  Comments)    From when younger    Current Facility-Administered Medications  Medication Dose Route Frequency Provider Last Rate Last Dose  . 0.9 %  sodium chloride infusion  250 mL Intravenous PRN Shanker M Ghimire, MD      . acetaminophen (TYLENOL) tablet 650 mg  650 mg Oral Q6H PRN Shanker M Ghimire, MD   650 mg at 12/13/11 2135   Or  . acetaminophen (TYLENOL) suppository 650 mg  650 mg Rectal Q6H PRN Shanker M Ghimire, MD      . aspirin tablet 325 mg  325 mg Oral Daily Shanker M Ghimire, MD   325 mg at 12/14/11 1021  . enoxaparin (LOVENOX) injection 40 mg  40 mg Subcutaneous Q24H Shanker M Ghimire, MD   40 mg at 12/13/11 1624  . HYDROcodone-acetaminophen (NORCO) 5-325 MG per tablet 1-2 tablet  1-2 tablet Oral Q4H PRN Shanker M Ghimire, MD      . insulin aspart (novoLOG) injection 0-15 Units  0-15 Units Subcutaneous TID WC Anand D Hongalgi, MD   11 Units at 12/13/11 1624  . insulin aspart (novoLOG) injection 0-5 Units  0-5 Units Subcutaneous QHS Anand D Hongalgi, MD      . insulin glargine (LANTUS) injection 15 Units  15 Units Subcutaneous QHS Anand D Hongalgi, MD   15 Units at 12/13/11 1623  . labetalol (NORMODYNE,TRANDATE) injection 10 mg  10 mg Intravenous Q2H PRN Shanker M Ghimire, MD      . lisinopril (PRINIVIL,ZESTRIL) tablet 5 mg  5 mg Oral Daily Shanker M Ghimire, MD   5 mg at 12/14/11 1021  . ondansetron (ZOFRAN) tablet 4 mg  4 mg Oral Q6H PRN Shanker M Ghimire, MD       Or  . ondansetron (ZOFRAN) injection 4 mg  4 mg Intravenous Q6H PRN Shanker M Ghimire, MD      . senna (SENOKOT) tablet 8.6 mg  1 tablet Oral BID Shanker M Ghimire, MD   8.6 mg at 12/13/11 2135  . simvastatin (ZOCOR) tablet 10 mg  10 mg Oral q1800 Shanker M Ghimire, MD   10 mg at 12/13/11 1712  . sodium chloride 0.9 % injection 3 mL  3 mL Intravenous Q12H Shanker M Ghimire, MD   3 mL at 12/14/11 1020  . sodium chloride 0.9 % injection 3 mL  3 mL Intravenous Q12H Shanker M Ghimire, MD   3 mL at 12/13/11 0939  .  sodium chloride 0.9 % injection 3 mL  3 mL Intravenous PRN Shanker M Ghimire, MD      . DISCONTD: insulin aspart (novoLOG) injection 0-9 Units  0-9 Units Subcutaneous TID WC Shanker M Ghimire, MD   7 Units at 12/13/11 1215     Imaging: Dg Chest 2 View  12/12/2011  *RADIOLOGY REPORT*  Clinical Data:   Altered mental status, weakness, hypertension  CHEST - 2 VIEW  Comparison: 06/04/2008  Findings: Normal heart size, mediastinal contours, and pulmonary vascularity. Minimal scarring right middle lobe. Minimal bronchitic changes. Lungs otherwise clear. No pleural effusion or pneumothorax. No acute osseous findings.  IMPRESSION: Minimal bronchitic changes and right middle lobe scarring.  Original Report Authenticated By: MARK A. BOLES, M.D.   Mr Mra Head Wo Contrast  12/12/2011  *RADIOLOGY REPORT*  Clinical Data:  Altered mental status.  Hypertension and diabetes.  MRI HEAD WITHOUT AND WITH CONTRAST MRA HEAD WITHOUT CONTRAST MRA NECK WITHOUT AND WITH CONTRAST  Technique:  Multiplanar, multiecho pulse sequences of the brain and surrounding structures were obtained without and with intravenous contrast.  Angiographic images of the Circle of Willis were obtained using MRA technique without intravenous contrast. Angiographic images of the neck were obtained using MRA technique without and with intravenous contrast.  Carotid stenosis measurements (when applicable) are obtained utilizing NASCET criteria, using the distal internal carotid diameter as the denominator.  Contrast: 15mL MULTIHANCE GADOBENATE DIMEGLUMINE 529 MG/ML IV SOLN  Comparison:  CT 12/12/2011  MRI HEAD  Findings:  Scattered small areas of restricted diffusion are present bilaterally, suggestive of acute infarction.  There are small lesions in the right cerebellum, right temporal parietal lobe, right caudate, and left frontal lobe.  There is a small lesion in the right occipital lobe.  These do not show enhancement or mass effect.  Small nonenhancing  white matter lesions are present bilaterally, compatible with chronic microvascular ischemia.  Brainstem is normal.  No hemorrhage or mass lesion is seen.  Postcontrast imaging of  the brain reveals normal enhancement.  IMPRESSION: Multiple small areas of restricted diffusion suggestive of acute infarct.  If these are  infarction, they are likely embolic as they involve multiple vascular distributions.  Normal enhancement pattern.  No mass lesion.  MRA HEAD  Findings: Both vertebral arteries are patent to the basilar.  Mild to moderate stenosis distal left vertebral artery.  Basilar is widely patent.  Left AICA is patent and probably diseased. Superior cerebellar artery and posterior cerebral arteries are patent bilaterally.  Moderate stenosis in the left posterior cerebral artery.  Mild narrowing of the cavernous carotid on the right, likely due to atherosclerotic disease.  Hypoplastic right A1 segment.  Anterior middle cerebral arteries are patent bilaterally without significant stenosis.  Negative for cerebral aneurysm.  IMPRESSION: Probable atherosclerotic disease involving the left distal vertebral artery and left posterior cerebral artery.  No large vessel occlusion.  MRA NECK  Findings: Standard branching of the aortic arch without proximal stenosis.  Right carotid:  Right common carotid artery is patent with mild irregularity and stenosis distally.  There is a critical stenosis of the proximal right internal carotid artery with a segmental area of signal loss.  There is also a moderate to severe stenosis at the origin of the right external carotid artery.  Left carotid:  Mild atherosclerotic irregularity of the carotid bifurcation.  There is plaque in the carotid bulb with approximately 40% diameter stenosis of the left internal carotid artery.  Vertebral arteries:  Both vertebral arteries are widely patent to the basilar without significant stenosis.  IMPRESSION: Critical stenosis proximal right internal  carotid artery, estimated to be greater than 85% diameter stenosis.  There is also a moderate to severe stenosis of the origin of the right external carotid artery.  Approximately 40% diameter stenosis of the proximal left internal carotid artery.  Original Report Authenticated By: DAVID C. CLARK,   M.D.   Mr Angiogram Neck W Wo Contrast  12/12/2011  *RADIOLOGY REPORT*  Clinical Data:  Altered mental status.  Hypertension and diabetes.  MRI HEAD WITHOUT AND WITH CONTRAST MRA HEAD WITHOUT CONTRAST MRA NECK WITHOUT AND WITH CONTRAST  Technique:  Multiplanar, multiecho pulse sequences of the brain and surrounding structures were obtained without and with intravenous contrast.  Angiographic images of the Circle of Willis were obtained using MRA technique without intravenous contrast. Angiographic images of the neck were obtained using MRA technique without and with intravenous contrast.  Carotid stenosis measurements (when applicable) are obtained utilizing NASCET criteria, using the distal internal carotid diameter as the denominator.  Contrast: 15mL MULTIHANCE GADOBENATE DIMEGLUMINE 529 MG/ML IV SOLN  Comparison:  CT 12/12/2011  MRI HEAD  Findings:  Scattered small areas of restricted diffusion are present bilaterally, suggestive of acute infarction.  There are small lesions in the right cerebellum, right temporal parietal lobe, right caudate, and left frontal lobe.  There is a small lesion in the right occipital lobe.  These do not show enhancement or mass effect.  Small nonenhancing white matter lesions are present bilaterally, compatible with chronic microvascular ischemia.  Brainstem is normal.  No hemorrhage or mass lesion is seen.  Postcontrast imaging of  the brain reveals normal enhancement.  IMPRESSION: Multiple small areas of restricted diffusion suggestive of acute infarct.  If these are  infarction, they are likely embolic as they involve multiple vascular distributions.  Normal enhancement pattern.  No  mass lesion.  MRA HEAD  Findings: Both vertebral arteries are patent to the basilar.  Mild to moderate stenosis distal left vertebral artery.  Basilar is widely patent.  Left AICA is patent and probably diseased. Superior cerebellar artery and posterior cerebral arteries are patent bilaterally.  Moderate stenosis in the left posterior cerebral artery.  Mild narrowing of the cavernous carotid on the right, likely due to atherosclerotic disease.  Hypoplastic right A1 segment.  Anterior middle cerebral arteries are patent bilaterally without significant stenosis.  Negative for cerebral aneurysm.  IMPRESSION: Probable atherosclerotic disease involving the left distal vertebral artery and left posterior cerebral artery.  No large vessel occlusion.  MRA NECK  Findings: Standard branching of the aortic arch without proximal stenosis.  Right carotid:  Right common carotid artery is patent with mild irregularity and stenosis distally.  There is a critical stenosis of the proximal right internal carotid artery with a segmental area of signal loss.  There is also a moderate to severe stenosis at the origin of the right external carotid artery.  Left carotid:  Mild atherosclerotic irregularity of the carotid bifurcation.  There is plaque in the carotid bulb with approximately 40% diameter stenosis of the left internal carotid artery.  Vertebral arteries:  Both vertebral arteries are widely patent to the basilar without significant stenosis.  IMPRESSION: Critical stenosis proximal right internal carotid artery, estimated to be greater than 85% diameter stenosis.  There is also a moderate to severe stenosis of the origin of the right external carotid artery.  Approximately 40% diameter stenosis of the proximal left internal carotid artery.  Original Report Authenticated By: DAVID C. CLARK, M.D.   Mr Brain W Wo Contrast  12/12/2011  *RADIOLOGY REPORT*  Clinical Data:  Altered mental status.  Hypertension and diabetes.  MRI HEAD  WITHOUT AND WITH CONTRAST MRA HEAD WITHOUT CONTRAST MRA NECK WITHOUT AND WITH CONTRAST  Technique:  Multiplanar, multiecho pulse sequences of the brain and surrounding structures were obtained without and with intravenous   contrast.  Angiographic images of the Circle of Willis were obtained using MRA technique without intravenous contrast. Angiographic images of the neck were obtained using MRA technique without and with intravenous contrast.  Carotid stenosis measurements (when applicable) are obtained utilizing NASCET criteria, using the distal internal carotid diameter as the denominator.  Contrast: 15mL MULTIHANCE GADOBENATE DIMEGLUMINE 529 MG/ML IV SOLN  Comparison:  CT 12/12/2011  MRI HEAD  Findings:  Scattered small areas of restricted diffusion are present bilaterally, suggestive of acute infarction.  There are small lesions in the right cerebellum, right temporal parietal lobe, right caudate, and left frontal lobe.  There is a small lesion in the right occipital lobe.  These do not show enhancement or mass effect.  Small nonenhancing white matter lesions are present bilaterally, compatible with chronic microvascular ischemia.  Brainstem is normal.  No hemorrhage or mass lesion is seen.  Postcontrast imaging of  the brain reveals normal enhancement.  IMPRESSION: Multiple small areas of restricted diffusion suggestive of acute infarct.  If these are  infarction, they are likely embolic as they involve multiple vascular distributions.  Normal enhancement pattern.  No mass lesion.  MRA HEAD  Findings: Both vertebral arteries are patent to the basilar.  Mild to moderate stenosis distal left vertebral artery.  Basilar is widely patent.  Left AICA is patent and probably diseased. Superior cerebellar artery and posterior cerebral arteries are patent bilaterally.  Moderate stenosis in the left posterior cerebral artery.  Mild narrowing of the cavernous carotid on the right, likely due to atherosclerotic disease.   Hypoplastic right A1 segment.  Anterior middle cerebral arteries are patent bilaterally without significant stenosis.  Negative for cerebral aneurysm.  IMPRESSION: Probable atherosclerotic disease involving the left distal vertebral artery and left posterior cerebral artery.  No large vessel occlusion.  MRA NECK  Findings: Standard branching of the aortic arch without proximal stenosis.  Right carotid:  Right common carotid artery is patent with mild irregularity and stenosis distally.  There is a critical stenosis of the proximal right internal carotid artery with a segmental area of signal loss.  There is also a moderate to severe stenosis at the origin of the right external carotid artery.  Left carotid:  Mild atherosclerotic irregularity of the carotid bifurcation.  There is plaque in the carotid bulb with approximately 40% diameter stenosis of the left internal carotid artery.  Vertebral arteries:  Both vertebral arteries are widely patent to the basilar without significant stenosis.  IMPRESSION: Critical stenosis proximal right internal carotid artery, estimated to be greater than 85% diameter stenosis.  There is also a moderate to severe stenosis of the origin of the right external carotid artery.  Approximately 40% diameter stenosis of the proximal left internal carotid artery.  Original Report Authenticated By: DAVID C. CLARK, M.D.    Significant Diagnostic Studies: CBC Lab Results  Component Value Date   WBC 7.5 12/13/2011   HGB 11.8* 12/13/2011   HCT 35.3* 12/13/2011   MCV 82.9 12/13/2011   PLT 393 12/13/2011    BMET    Component Value Date/Time   NA 139 12/13/2011 0710   K 4.0 12/13/2011 0710   CL 102 12/13/2011 0710   CO2 25 12/13/2011 0710   GLUCOSE 302* 12/13/2011 0710   BUN 12 12/13/2011 0710   CREATININE 0.73 12/13/2011 0710   CALCIUM 9.8 12/13/2011 0710   GFRNONAA >90 12/13/2011 0710   GFRAA >90 12/13/2011 0710    COAG Lab Results  Component Value Date   INR   1.0 06/04/2008   No  results found for this basename: PTT     Physical Examination BP Readings from Last 3 Encounters:  12/14/11 156/84  12/14/11 156/84   Temp Readings from Last 3 Encounters:  12/14/11 98.8 F (37.1 C) Oral  12/14/11 98.8 F (37.1 C) Oral   SpO2 Readings from Last 3 Encounters:  12/14/11 94%  12/14/11 94%   Pulse Readings from Last 3 Encounters:  12/14/11 86  12/14/11 86    General:  WDWN in NAD HENT: WNL Eyes: Pupils equal Pulmonary: normal non-labored breathing , without Rales, rhonchi,  wheezing Cardiac: RRR, without  Murmurs, rubs or gallops; No carotid bruits Abdomen: soft, NT, no masses Skin: no rashes, ulcers noted Vascular Exam/Pulses:palpable radial pulses equal, DP/PT palpable and equal  Extremities without ischemic changes, no Gangrene , no cellulitis; no open wounds;  Musculoskeletal: no muscle wasting or atrophy  Neurologic: A&O X 3; Appropriate Affect ;  SENSATION: normal; MOTOR FUNCTION: Pt has good and equal strength in all extremities - 5/5 Speech is fluent/normal  Non-Invasive Vascular Imaging: MRA NECK  IMPRESSION:  Critical stenosis proximal right internal carotid artery, estimated  to be greater than 85% diameter stenosis. There is also a moderate  to severe stenosis of the origin of the right external carotid  ASSESSMENT/PLAN:Severe right carotid stenosis with symptoms of perseverance   Dr. Carma Dwiggins to evaluate and determine plan of care and timing of possible surgical intervention.  Agree with above.  With her pattern of CVA by MRI being bilateral, I would suspect that her symptoms are due to a more central source.  Her TEE provides a possible explanation.  Recommend medical management at this point.  However, she does have high grade carotid stenosis as indicated by MRI and carotid dopplers.  To determine the exact extent of her stenosis, I would recommend a formal carotid angiogram.  At this point, I feel that she is asymptomatic from her  carotid disease.  I will schedule her for angiogram next week.  I discussed the reasoning behind additional testing, as well as the less than 1% risk of stroke from the procedure.  She has a history of bleeding following cath in the pst.  Wells Serine Kea [phone]   

## 2011-12-14 NOTE — Progress Notes (Signed)
Occupational Therapy Evaluation Patient Details Name: [name] MRN: [id number] DOB: September 03, 1955 Today's Date: 12/14/2011 Time: 4098-1191 OT Time Calculation (min): 19 min  OT Assessment / Plan / Recommendation Clinical Impression  Pt with history of diabetes mellitus, coronary artery disease, hypertension and hyperlipidemia, presenting with a history of confusion with fairly striking short-term memory difficulty. MRI findings show scattered small areas of restricted diffusion are present bilaterally, suggestive of acute infarction. Pt able to demonstrate functional mobility and ADLs at independent level.  Pt reports difficulty remembering events leading to admission, but cognition WFL otherwise.  Educated pt on stroke signs and risk factors.  No further acute OT needs.    OT Assessment  Patient does not need any further OT services    Follow Up Recommendations  No OT follow up    Barriers to Discharge      Equipment Recommendations  None recommended by OT    Recommendations for Other Services    Frequency       Precautions / Restrictions Precautions Precautions: None Restrictions Weight Bearing Restrictions: No   Pertinent Vitals/Pain See vitals    ADL  Eating/Feeding: Independent;Performed Where Assessed - Eating/Feeding: Edge of bed Grooming: Performed;Wash/dry hands;Wash/dry face;Teeth care;Independent Where Assessed - Grooming: Unsupported standing Upper Body Dressing: Performed;Independent Where Assessed - Upper Body Dressing: Unsupported sitting Lower Body Dressing: Performed;Independent Where Assessed - Lower Body Dressing: Unsupported sit to stand Toilet Transfer: Performed;Independent Toilet Transfer Method:  (ambulating) Acupuncturist: Regular height toilet Toileting - Clothing Manipulation and Hygiene: Performed;Independent Where Assessed - Toileting Clothing Manipulation and Hygiene: Sit to stand from 3-in-1 or toilet Transfers/Ambulation  Related to ADLs: independent throughout room ADL Comments: at baseline level    OT Diagnosis:    OT Problem List:   OT Treatment Interventions:     OT Goals    Visit Information  Last OT Received On: 12/14/11    Subjective Data      Prior Functioning  Home Living Lives With: Alone Available Help at Discharge: Family;Available 24 hours/day (son and his family are moving in) Type of Home: House Home Access: Stairs to enter [employer] of Steps: 4 Entrance Stairs-Rails: None Home Layout: Two level;Able to live on main level with bedroom/bathroom Alternate Level Stairs-Number of Steps: 13 Alternate Level Stairs-Rails: Can reach both Bathroom Shower/Tub: Tub/shower unit;C[employer]ain Firefighter: Standard Bathroom Accessibility: Yes How Accessible: Accessible via walker Home Adaptive Equipment: None Prior Function Level of Independence: Independent Able to Take Stairs?: Yes Driving: Yes Vocation: Unemployed Communication Communication: No difficulties Dominant Hand: Right    Cognition  Overall Cognitive Status: Appears within functional limits for tasks assessed/performed Arousal/Alertness: Awake/alert Orientation Level: Appears intact for tasks assessed Behavior During Session: [facility] for tasks performed    Extremity/Trunk Assessment Right Upper Extremity Assessment RUE ROM/Strength/Tone: Within functional levels RUE Sensation: WFL - Light Touch;WFL - Proprioception Left Upper Extremity Assessment LUE ROM/Strength/Tone: Within functional levels LUE Sensation: WFL - Light Touch;WFL - Proprioception   Mobility Transfers Sit to Stand: 7: Independent;From bed;From toilet Stand to Sit: 7: Independent;To toilet;To bed   Exercise    Balance    End of Session OT - End of Session Activity Tolerance: Patient tolerated treatment well Patient left: in bed;with call bell/phone within reach;with nursing in room Nurse Communication: Mobility status;Other (comment)  (signing off of OT)  12/14/2011 Cipriano Mile OTR/L Pager [phone] Office [phone]  Cipriano Mile 12/14/2011, 9:29 AM

## 2011-12-14 NOTE — Progress Notes (Signed)
CSW attempted to follow up with patient regarding advanced directives however pt is currently in a procedure. CSW will continue to follow to assist with advanced directives.   Catha Gosselin, Theresia Majors  [phone] .12/14/2011 1418pm

## 2011-12-17 ENCOUNTER — Other Ambulatory Visit: Payer: Self-pay | Admitting: *Deleted

## 2011-12-17 ENCOUNTER — Encounter (HOSPITAL_COMMUNITY): Payer: Self-pay | Admitting: Pharmacy Technician

## 2011-12-17 ENCOUNTER — Encounter (HOSPITAL_COMMUNITY): Payer: Self-pay | Admitting: Cardiology

## 2011-12-17 DIAGNOSIS — I4891 Unspecified atrial fibrillation: Secondary | ICD-10-CM

## 2011-12-19 ENCOUNTER — Encounter (HOSPITAL_COMMUNITY)
Admission: RE | Disposition: A | Discharge status: Home / Self Care | Payer: Self-pay | Source: Ambulatory Visit | Attending: Surgery

## 2011-12-19 ENCOUNTER — Ambulatory Visit (HOSPITAL_COMMUNITY)
Admission: RE | Admit: 2011-12-19 | Discharge: 2011-12-19 | Disposition: A | Discharge status: Home / Self Care | Payer: MEDICAID | Source: Ambulatory Visit | Attending: Surgery | Admitting: Surgery

## 2011-12-19 DIAGNOSIS — I63239 Cerebral infarction due to unspecified occlusion or stenosis of unspecified carotid arteries: Secondary | ICD-10-CM | POA: Insufficient documentation

## 2011-12-19 DIAGNOSIS — E785 Hyperlipidemia, unspecified: Secondary | ICD-10-CM | POA: Insufficient documentation

## 2011-12-19 DIAGNOSIS — I6529 Occlusion and stenosis of unspecified carotid artery: Secondary | ICD-10-CM

## 2011-12-19 DIAGNOSIS — I10 Essential (primary) hypertension: Secondary | ICD-10-CM | POA: Insufficient documentation

## 2011-12-19 DIAGNOSIS — E119 Type 2 diabetes mellitus without complications: Secondary | ICD-10-CM | POA: Insufficient documentation

## 2011-12-19 DIAGNOSIS — I2510 Atherosclerotic heart disease of native coronary artery without angina pectoris: Secondary | ICD-10-CM | POA: Insufficient documentation

## 2011-12-19 DIAGNOSIS — E7800 Pure hypercholesterolemia, unspecified: Secondary | ICD-10-CM | POA: Insufficient documentation

## 2011-12-19 HISTORY — PX: CARDIAC CATHETERIZATION: SHX172

## 2011-12-19 HISTORY — PX: CAROTID ANGIOGRAM: SHX5504

## 2011-12-19 LAB — GLUCOSE, CAPILLARY: Glucose-Capillary: 324 mg/dL — ABNORMAL HIGH (ref 70–99)

## 2011-12-19 SURGERY — CAROTID ANGIOGRAM
Anesthesia: LOCAL | Laterality: Bilateral

## 2011-12-19 MED ORDER — OXYCODONE HCL 5 MG PO TABS: 5.0000 mg | ORAL_TABLET | ORAL | Status: DC | PRN

## 2011-12-19 MED ORDER — INSULIN ASPART 100 UNIT/ML ~~LOC~~ SOLN
SUBCUTANEOUS | Status: AC
  Administered 2011-12-19: 10 [IU] via SUBCUTANEOUS
  Filled 2011-12-19: qty 1

## 2011-12-19 MED ORDER — ALUM & MAG HYDROXIDE-SIMETH 200-200-20 MG/5ML PO SUSP: 15.0000 mL | ORAL | Status: DC | PRN

## 2011-12-19 MED ORDER — ACETAMINOPHEN 325 MG PO TABS
325.0000 mg | ORAL_TABLET | ORAL | Status: DC | PRN
Start: 2011-12-19 — End: 2011-12-19

## 2011-12-19 MED ORDER — INSULIN ASPART 100 UNIT/ML ~~LOC~~ SOLN
10.0000 [IU] | Freq: Once | SUBCUTANEOUS | Status: AC
  Administered 2011-12-19: 10 [IU] via SUBCUTANEOUS

## 2011-12-19 MED ORDER — HEPARIN (PORCINE) IN NACL 2-0.9 UNIT/ML-% IJ SOLN
INTRAMUSCULAR | Status: AC
  Filled 2011-12-19: qty 1000

## 2011-12-19 MED ORDER — PHENOL 1.4 % MT LIQD: 1.0000 | OROMUCOSAL | Status: DC | PRN

## 2011-12-19 MED ORDER — MORPHINE SULFATE 10 MG/ML IJ SOLN: 2.0000 mg | INTRAMUSCULAR | Status: DC | PRN

## 2011-12-19 MED ORDER — HYDRALAZINE HCL 20 MG/ML IJ SOLN: 10.0000 mg | INTRAMUSCULAR | Status: DC | PRN

## 2011-12-19 MED ORDER — ONDANSETRON HCL 4 MG/2ML IJ SOLN: 4.0000 mg | Freq: Four times a day (QID) | INTRAMUSCULAR | Status: DC | PRN

## 2011-12-19 MED ORDER — LABETALOL HCL 5 MG/ML IV SOLN: 10.0000 mg | INTRAVENOUS | Status: DC | PRN

## 2011-12-19 MED ORDER — LIDOCAINE HCL (PF) 1 % IJ SOLN
INTRAMUSCULAR | Status: AC
  Filled 2011-12-19: qty 30

## 2011-12-19 MED ORDER — METOPROLOL TARTRATE 1 MG/ML IV SOLN: 2.0000 mg | INTRAVENOUS | Status: DC | PRN

## 2011-12-19 MED ORDER — SODIUM CHLORIDE 0.9 % IV SOLN
INTRAVENOUS | Status: DC
  Administered 2011-12-19: 09:00:00 via INTRAVENOUS

## 2011-12-19 MED ORDER — FENTANYL CITRATE 0.05 MG/ML IJ SOLN
INTRAMUSCULAR | Status: AC
  Filled 2011-12-19: qty 2

## 2011-12-19 MED ORDER — HYDRALAZINE HCL 20 MG/ML IJ SOLN
INTRAMUSCULAR | Status: AC
  Filled 2011-12-19: qty 1

## 2011-12-19 MED ORDER — ACETAMINOPHEN 325 MG RE SUPP: 325.0000 mg | RECTAL | Status: DC | PRN

## 2011-12-19 MED ORDER — GUAIFENESIN-DM 100-10 MG/5ML PO SYRP: 15.0000 mL | ORAL_SOLUTION | ORAL | Status: DC | PRN

## 2011-12-19 MED ORDER — SODIUM CHLORIDE 0.9 % IV SOLN: 1.0000 mL/kg/h | INTRAVENOUS | Status: DC

## 2011-12-19 NOTE — Discharge Instructions (Signed)

## 2011-12-19 NOTE — Op Note (Signed)
[facility]  Patient name: [name] MRN: [id number] DOB: [birth year] Sex: female  12/19/2011 Pre-operative Diagnosis: Status post bilateral hemispheric stroke with carotid stenosis right greater than left Post-operative diagnosis:  Same Surgeon:  Jorge Ny Procedure Performed:  1.  ultrasound access right femoral artery  2.  aortic arch angiogram  3.  second order catheterization (right common carotid artery)  4.  right carotid angiogram  5.  first order catheterization (left common carotid artery)  6.  left  Carotid angiogram    Indications:  The patient was seen and evaluated while an inpatient last week. She suffered bilateral hemispheric strokes. During her workup she was found to have a carotid duplex which showed bilateral greater than 80% stenosis. In am RA revealed greater than 80% right carotid stenosis and 40% left carotid stenosis. Her symptoms were not felt to be due to her carotid disease but because of the high-grade stenosis a formal arteriogram was recommended by myself to further evaluate her carotid disease  Procedure:  The patient was identified in the holding area and taken to room 8.  The patient was then placed supine on the table and prepped and draped in the usual sterile fashion.  A time out was called.  Ultrasound was used to evaluate the right common femoral artery.  It was patent .  A digital ultrasound image was acquired. An 18-gauge needle was used to access the right common femoral artery under ultrasound guidance. A Benson wire was advanced without resistance and a 5 French sheath was placed. Over the wire a pigtail catheter was advanced into the descending aorta and an aortic arch angiogram was performed. Next, using a Berenstein 2 catheter the right common carotid artery was selected and a right carotid angiogram with intracerebral images was obtained. Next the [facility] 2 catheter was used to select the left common  carotid artery and left carotid angiogram was performed with intracranial imaging.  Intracranial interpretation will be performed separately by neuroradiology.  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 No significant stenosis is identified within the innominate artery or the proximal right subclavian artery. The proximal right common carotid artery is patent. The proximal left common carotid artery is widely patent. The left subclavian artery is widely patent. Both vertebral arteries originate from the subclavian artery. The left is dominant. No definitive stenosis is identified  Right carotid angiogram:  The right common carotid artery is patent throughout it's course. There is stenosis identify within the proximal right external carotid artery and a high-grade, 85-90% right internal carotid artery stenosis. There is a very tortuous distal extracranial right internal carotid artery  Left carotid angiogram:  The left common carotid artery is widely patent. Moderate stenosis in the 20-30% range is identified within the left internal carotid artery. The left external carotid artery is patent.  Intervention:  None  Impression:  #1  type I aortic arch  #2  high-grade right carotid stenosis, approximately 85-90% with a redundant distal internal carotid artery.  #3  mild stenosis within the left internal carotid artery, approximately 30%.    Juleen China, M.D. [facility] [place] Office: [phone] Pager:  [phone]

## 2011-12-20 LAB — CULTURE, BLOOD (ROUTINE X 2)
Culture  Setup Time: 201306212341
Culture: NO GROWTH

## 2011-12-20 NOTE — H&P (View-Only) (Signed)
[facility] [place] CONSULT NOTE 12/14/2011 DOB: [id number] MRN : [id number]  BJ:[id number] stenosis s/p TIA   History of Present Illness: [name] is an 56 y.o. female history of diabetes mellitus, coronary artery disease, hypertension and hyperlipidemia, presenting 12/12/2011 with a history of confusion with fairly striking short-term memory difficulty. Patient asked same questions over and over despite being given the correct answers by family members. Symptoms lasted at least several hours. There was no focal weakness. Speech was not slurred. There is no previous history of similar symptoms. Patient has no memory for any events this morning and early afternoon today. She has not been on antiplatelet therapy. CT scan showed no acute intracranial abnormality. MRI showed multiple small areas of restricted diffusion suggestive of acute infarctions of possible proximal embolic source since multiple vascular territories were involved. MRA of the head showed no significant stenotic or occlusive lesion. MRA of the neck showed high-grade right internal carotid artery stenosis as well as moderate stenosis at the origin of the right external carotid artery. Patient was not a TPA candidate secondary to presenting outside the TPA window. She was admitted for further evaluation and treatment.  She has a history of coronary disease with stent placement.  She is medically managed for her hypertension.  She is a former smoker.   Past Medical History  Diagnosis Date  . CAD (coronary artery disease) 12/12/2011  . Hypertension, accelerated 12/12/2011  . Refusal of blood transfusions as patient is Jehovah's Witness   . Complication of anesthesia     "they have trouble waking me up"  . High cholesterol   . Anginal pain   . History of bronchitis   . Asthma 2006    "was hospitalized for this in [place]"  . Type II diabetes mellitus     "some say I'm type I"  . Headache   . Migraines   . Kidney  stones     "several times"  . Arthritis   . Chronic lower back pain   . Claustrophobia   . Anxiety     Past Surgical History  Procedure Date  . Kidney stone surgery   . Neuroplasty / transposition median nerve at carpal tunnel bilateral ~ 2006  . Coronary angioplasty with stent placement ~ 2004    "2"  . Breast biopsy     left  . Cataract extraction w/ intraocular lens implant 2012    left     ROS: [x]  Positive  [ ]  Denies    General: [ ]  Weight loss, [ ]  Fever, [ ]  chills Neurologic: [ ]  Dizziness, [ ]  Blackouts, [ ]  Seizure [ ]  Stroke, [ ]  "Mini stroke", [ ]  Slurred speech, [ ]  Temporary blindness; [ ]  weakness in arms or legs, [ ]  Hoarseness Cardiac: [ ]  Chest pain/pressure, [ ]  Shortness of breath at rest [ ]  Shortness of breath with exertion, [ ]  Atrial fibrillation or irregular heartbeat Vascular: [ ]  Pain in legs with walking, [ ]  Pain in legs at rest, [ ]  Pain in legs at night,  [ ]  Non-healing ulcer, [ ]  Blood clot in vein/DVT,   Pulmonary: [ ]  Home oxygen, [ ]  Productive cough, [ ]  Coughing up blood, [x ] Asthma,  [ ]  Wheezing Musculoskeletal:  [ ]  Arthritis, [ ]  Low back pain, [ ]  Joint pain Hematologic: [ ]  Easy Bruising, [ ]  Anemia; [ ]  Hepatitis Gastrointestinal: [ ]  Blood in stool, [ ]  Gastroesophageal Reflux/heartburn, [ ]  Trouble swallowing Urinary: [ ]   chronic Kidney disease, [ ]  on HD - [ ]  MWF or [ ]  TTHS, [ ]  Burning with urination, [ ]  Difficulty urinating Skin: [ ]  Rashes, [ ]  Wounds Psychological: [ ]  Anxiety, [ ]  Depression  Social History History  Substance Use Topics  . Smoking status: Former Smoker -- 1.0 packs/day for 3 years    Types: Cigarettes    Quit date: 06/26/1975  . Smokeless tobacco: Never Used  . Alcohol Use: Yes     12/12/11 "occasionally drink whiskey; maybe 1/4 of a drink/wk average"    Family History History reviewed. No pertinent family history.  Allergies  Allergen Reactions  . Penicillins Itching and Other (See  Comments)    From when younger    Current Facility-Administered Medications  Medication Dose Route Frequency Provider Last Rate Last Dose  . 0.9 %  sodium chloride infusion  250 mL Intravenous PRN Maretta Bees, MD      . acetaminophen (TYLENOL) tablet 650 mg  650 mg Oral Q6H PRN Maretta Bees, MD   650 mg at 12/13/11 2135   Or  . acetaminophen (TYLENOL) suppository 650 mg  650 mg Rectal Q6H PRN Maretta Bees, MD      . aspirin tablet 325 mg  325 mg Oral Daily Maretta Bees, MD   325 mg at 12/14/11 1021  . enoxaparin (LOVENOX) injection 40 mg  40 mg Subcutaneous Q24H Maretta Bees, MD   40 mg at 12/13/11 1624  . HYDROcodone-acetaminophen (NORCO) 5-325 MG per tablet 1-2 tablet  1-2 tablet Oral Q4H PRN Maretta Bees, MD      . insulin aspart (novoLOG) injection 0-15 Units  0-15 Units Subcutaneous TID WC Elease Etienne, MD   11 Units at 12/13/11 1624  . insulin aspart (novoLOG) injection 0-5 Units  0-5 Units Subcutaneous QHS Elease Etienne, MD      . insulin glargine (LANTUS) injection 15 Units  15 Units Subcutaneous QHS Elease Etienne, MD   15 Units at 12/13/11 1623  . labetalol (NORMODYNE,TRANDATE) injection 10 mg  10 mg Intravenous Q2H PRN Maretta Bees, MD      . lisinopril (PRINIVIL,ZESTRIL) tablet 5 mg  5 mg Oral Daily Maretta Bees, MD   5 mg at 12/14/11 1021  . ondansetron (ZOFRAN) tablet 4 mg  4 mg Oral Q6H PRN Shanker Levora Dredge, MD       Or  . ondansetron [facility]) injection 4 mg  4 mg Intravenous Q6H PRN Maretta Bees, MD      . senna [facility]) tablet 8.6 mg  1 tablet Oral BID Maretta Bees, MD   8.6 mg at 12/13/11 2135  . simvastatin (ZOCOR) tablet 10 mg  10 mg Oral q1800 Maretta Bees, MD   10 mg at 12/13/11 1712  . sodium chloride 0.9 % injection 3 mL  3 mL Intravenous Q12H Maretta Bees, MD   3 mL at 12/14/11 1020  . sodium chloride 0.9 % injection 3 mL  3 mL Intravenous Q12H Maretta Bees, MD   3 mL at 12/13/11 0939  .  sodium chloride 0.9 % injection 3 mL  3 mL Intravenous PRN Maretta Bees, MD      . DISCONTD: insulin aspart (novoLOG) injection 0-9 Units  0-9 Units Subcutaneous TID WC Maretta Bees, MD   7 Units at 12/13/11 1215     Imaging: Dg Chest 2 View  12/12/2011  *RADIOLOGY REPORT*  Clinical Data:  Altered mental status, weakness, hypertension  CHEST - 2 VIEW  Comparison: 06/04/2008  Findings: Normal heart size, mediastinal contours, and pulmonary vascularity. Minimal scarring right middle lobe. Minimal bronchitic changes. Lungs otherwise clear. No pleural effusion or pneumothorax. No acute osseous findings.  IMPRESSION: Minimal bronchitic changes and right middle lobe scarring.  Original Report Authenticated By: Lollie Marrow, M.D.   Mr Maxine Glenn Head Wo Contrast  12/12/2011  *RADIOLOGY REPORT*  Clinical Data:  Altered mental status.  Hypertension and diabetes.  MRI HEAD WITHOUT AND WITH CONTRAST MRA HEAD WITHOUT CONTRAST MRA NECK WITHOUT AND WITH CONTRAST  Technique:  Multiplanar, multiecho pulse sequences of the brain and surrounding structures were obtained without and with intravenous contrast.  Angiographic images of the Circle of Willis were obtained using MRA technique without intravenous contrast. Angiographic images of the neck were obtained using MRA technique without and with intravenous contrast.  Carotid stenosis measurements (when applicable) are obtained utilizing NASCET criteria, using the distal internal carotid diameter as the denominator.  Contrast: 15mL MULTIHANCE GADOBENATE DIMEGLUMINE 529 MG/ML IV SOLN  Comparison:  CT 12/12/2011  MRI HEAD  Findings:  Scattered small areas of restricted diffusion are present bilaterally, suggestive of acute infarction.  There are small lesions in the right cerebellum, right temporal parietal lobe, right caudate, and left frontal lobe.  There is a small lesion in the right occipital lobe.  These do not show enhancement or mass effect.  Small nonenhancing  white matter lesions are present bilaterally, compatible with chronic microvascular ischemia.  Brainstem is normal.  No hemorrhage or mass lesion is seen.  Postcontrast imaging of  the brain reveals normal enhancement.  IMPRESSION: Multiple small areas of restricted diffusion suggestive of acute infarct.  If these are  infarction, they are likely embolic as they involve multiple vascular distributions.  Normal enhancement pattern.  No mass lesion.  MRA HEAD  Findings: Both vertebral arteries are patent to the basilar.  Mild to moderate stenosis distal left vertebral artery.  Basilar is widely patent.  Left AICA is patent and probably diseased. Superior cerebellar artery and posterior cerebral arteries are patent bilaterally.  Moderate stenosis in the left posterior cerebral artery.  Mild narrowing of the cavernous carotid on the right, likely due to atherosclerotic disease.  Hypoplastic right A1 segment.  Anterior middle cerebral arteries are patent bilaterally without significant stenosis.  Negative for cerebral aneurysm.  IMPRESSION: Probable atherosclerotic disease involving the left distal vertebral artery and left posterior cerebral artery.  No large vessel occlusion.  MRA NECK  Findings: Standard branching of the aortic arch without proximal stenosis.  Right carotid:  Right common carotid artery is patent with mild irregularity and stenosis distally.  There is a critical stenosis of the proximal right internal carotid artery with a segmental area of signal loss.  There is also a moderate to severe stenosis at the origin of the right external carotid artery.  Left carotid:  Mild atherosclerotic irregularity of the carotid bifurcation.  There is plaque in the carotid bulb with approximately 40% diameter stenosis of the left internal carotid artery.  Vertebral arteries:  Both vertebral arteries are widely patent to the basilar without significant stenosis.  IMPRESSION: Critical stenosis proximal right internal  carotid artery, estimated to be greater than 85% diameter stenosis.  There is also a moderate to severe stenosis of the origin of the right external carotid artery.  Approximately 40% diameter stenosis of the proximal left internal carotid artery.  Original Report Authenticated By: Camelia Phenes,  M.D.   Mr Angiogram Neck W Wo Contrast  12/12/2011  *RADIOLOGY REPORT*  Clinical Data:  Altered mental status.  Hypertension and diabetes.  MRI HEAD WITHOUT AND WITH CONTRAST MRA HEAD WITHOUT CONTRAST MRA NECK WITHOUT AND WITH CONTRAST  Technique:  Multiplanar, multiecho pulse sequences of the brain and surrounding structures were obtained without and with intravenous contrast.  Angiographic images of the Circle of Willis were obtained using MRA technique without intravenous contrast. Angiographic images of the neck were obtained using MRA technique without and with intravenous contrast.  Carotid stenosis measurements (when applicable) are obtained utilizing NASCET criteria, using the distal internal carotid diameter as the denominator.  Contrast: 15mL MULTIHANCE GADOBENATE DIMEGLUMINE 529 MG/ML IV SOLN  Comparison:  CT 12/12/2011  MRI HEAD  Findings:  Scattered small areas of restricted diffusion are present bilaterally, suggestive of acute infarction.  There are small lesions in the right cerebellum, right temporal parietal lobe, right caudate, and left frontal lobe.  There is a small lesion in the right occipital lobe.  These do not show enhancement or mass effect.  Small nonenhancing white matter lesions are present bilaterally, compatible with chronic microvascular ischemia.  Brainstem is normal.  No hemorrhage or mass lesion is seen.  Postcontrast imaging of  the brain reveals normal enhancement.  IMPRESSION: Multiple small areas of restricted diffusion suggestive of acute infarct.  If these are  infarction, they are likely embolic as they involve multiple vascular distributions.  Normal enhancement pattern.  No  mass lesion.  MRA HEAD  Findings: Both vertebral arteries are patent to the basilar.  Mild to moderate stenosis distal left vertebral artery.  Basilar is widely patent.  Left AICA is patent and probably diseased. Superior cerebellar artery and posterior cerebral arteries are patent bilaterally.  Moderate stenosis in the left posterior cerebral artery.  Mild narrowing of the cavernous carotid on the right, likely due to atherosclerotic disease.  Hypoplastic right A1 segment.  Anterior middle cerebral arteries are patent bilaterally without significant stenosis.  Negative for cerebral aneurysm.  IMPRESSION: Probable atherosclerotic disease involving the left distal vertebral artery and left posterior cerebral artery.  No large vessel occlusion.  MRA NECK  Findings: Standard branching of the aortic arch without proximal stenosis.  Right carotid:  Right common carotid artery is patent with mild irregularity and stenosis distally.  There is a critical stenosis of the proximal right internal carotid artery with a segmental area of signal loss.  There is also a moderate to severe stenosis at the origin of the right external carotid artery.  Left carotid:  Mild atherosclerotic irregularity of the carotid bifurcation.  There is plaque in the carotid bulb with approximately 40% diameter stenosis of the left internal carotid artery.  Vertebral arteries:  Both vertebral arteries are widely patent to the basilar without significant stenosis.  IMPRESSION: Critical stenosis proximal right internal carotid artery, estimated to be greater than 85% diameter stenosis.  There is also a moderate to severe stenosis of the origin of the right external carotid artery.  Approximately 40% diameter stenosis of the proximal left internal carotid artery.  Original Report Authenticated By: Camelia Phenes, M.D.   Mr Laqueta Jean Wo Contrast  12/12/2011  *RADIOLOGY REPORT*  Clinical Data:  Altered mental status.  Hypertension and diabetes.  MRI HEAD  WITHOUT AND WITH CONTRAST MRA HEAD WITHOUT CONTRAST MRA NECK WITHOUT AND WITH CONTRAST  Technique:  Multiplanar, multiecho pulse sequences of the brain and surrounding structures were obtained without and with intravenous  contrast.  Angiographic images of the Circle of Willis were obtained using MRA technique without intravenous contrast. Angiographic images of the neck were obtained using MRA technique without and with intravenous contrast.  Carotid stenosis measurements (when applicable) are obtained utilizing NASCET criteria, using the distal internal carotid diameter as the denominator.  Contrast: 15mL MULTIHANCE GADOBENATE DIMEGLUMINE 529 MG/ML IV SOLN  Comparison:  CT 12/12/2011  MRI HEAD  Findings:  Scattered small areas of restricted diffusion are present bilaterally, suggestive of acute infarction.  There are small lesions in the right cerebellum, right temporal parietal lobe, right caudate, and left frontal lobe.  There is a small lesion in the right occipital lobe.  These do not show enhancement or mass effect.  Small nonenhancing white matter lesions are present bilaterally, compatible with chronic microvascular ischemia.  Brainstem is normal.  No hemorrhage or mass lesion is seen.  Postcontrast imaging of  the brain reveals normal enhancement.  IMPRESSION: Multiple small areas of restricted diffusion suggestive of acute infarct.  If these are  infarction, they are likely embolic as they involve multiple vascular distributions.  Normal enhancement pattern.  No mass lesion.  MRA HEAD  Findings: Both vertebral arteries are patent to the basilar.  Mild to moderate stenosis distal left vertebral artery.  Basilar is widely patent.  Left AICA is patent and probably diseased. Superior cerebellar artery and posterior cerebral arteries are patent bilaterally.  Moderate stenosis in the left posterior cerebral artery.  Mild narrowing of the cavernous carotid on the right, likely due to atherosclerotic disease.   Hypoplastic right A1 segment.  Anterior middle cerebral arteries are patent bilaterally without significant stenosis.  Negative for cerebral aneurysm.  IMPRESSION: Probable atherosclerotic disease involving the left distal vertebral artery and left posterior cerebral artery.  No large vessel occlusion.  MRA NECK  Findings: Standard branching of the aortic arch without proximal stenosis.  Right carotid:  Right common carotid artery is patent with mild irregularity and stenosis distally.  There is a critical stenosis of the proximal right internal carotid artery with a segmental area of signal loss.  There is also a moderate to severe stenosis at the origin of the right external carotid artery.  Left carotid:  Mild atherosclerotic irregularity of the carotid bifurcation.  There is plaque in the carotid bulb with approximately 40% diameter stenosis of the left internal carotid artery.  Vertebral arteries:  Both vertebral arteries are widely patent to the basilar without significant stenosis.  IMPRESSION: Critical stenosis proximal right internal carotid artery, estimated to be greater than 85% diameter stenosis.  There is also a moderate to severe stenosis of the origin of the right external carotid artery.  Approximately 40% diameter stenosis of the proximal left internal carotid artery.  Original Report Authenticated By: Camelia Phenes, M.D.    Significant Diagnostic Studies: CBC Lab Results  Component Value Date   WBC 7.5 12/13/2011   HGB 11.8* 12/13/2011   HCT 35.3* 12/13/2011   MCV 82.9 12/13/2011   PLT 393 12/13/2011    BMET    Component Value Date/Time   NA 139 12/13/2011 0710   K 4.0 12/13/2011 0710   CL 102 12/13/2011 0710   CO2 25 12/13/2011 0710   GLUCOSE 302* 12/13/2011 0710   BUN 12 12/13/2011 0710   CREATININE 0.73 12/13/2011 0710   CALCIUM 9.8 12/13/2011 0710   GFRNONAA >90 12/13/2011 0710   GFRAA >90 12/13/2011 0710    COAG Lab Results  Component Value Date   INR  1.0 06/04/2008   No  results found for this basename: PTT     Physical Examination BP Readings from Last 3 Encounters:  12/14/11 156/84  12/14/11 156/84   Temp Readings from Last 3 Encounters:  12/14/11 98.8 F (37.1 C) Oral  12/14/11 98.8 F (37.1 C) Oral   SpO2 Readings from Last 3 Encounters:  12/14/11 94%  12/14/11 94%   Pulse Readings from Last 3 Encounters:  12/14/11 86  12/14/11 86    General:  WDWN in NAD HENT: WNL Eyes: Pupils equal Pulmonary: normal non-labored breathing , without Rales, rhonchi,  wheezing Cardiac: RRR, without  Murmurs, rubs or gallops; No carotid bruits Abdomen: soft, NT, no masses Skin: no rashes, ulcers noted Vascular Exam/Pulses:palpable radial pulses equal, DP/PT palpable and equal  Extremities without ischemic changes, no Gangrene , no cellulitis; no open wounds;  Musculoskeletal: no muscle wasting or atrophy  Neurologic: A&O X 3; Appropriate Affect ;  SENSATION: normal; MOTOR FUNCTION: Pt has good and equal strength in all extremities - 5/5 Speech is fluent/normal  Non-Invasive Vascular Imaging: MRA NECK  IMPRESSION:  Critical stenosis proximal right internal carotid artery, estimated  to be greater than 85% diameter stenosis. There is also a moderate  to severe stenosis of the origin of the right external carotid  ASSESSMENT/PLAN:Severe right carotid stenosis with symptoms of perseverance   Dr. Myra Gianotti to evaluate and determine plan of care and timing of possible surgical intervention.  Agree with above.  With her pattern of CVA by MRI being bilateral, I would suspect that her symptoms are due to a more central source.  Her TEE provides a possible explanation.  Recommend medical management at this point.  However, she does have high grade carotid stenosis as indicated by MRI and carotid dopplers.  To determine the exact extent of her stenosis, I would recommend a formal carotid angiogram.  At this point, I feel that she is asymptomatic from her  carotid disease.  I will schedule her for angiogram next week.  I discussed the reasoning behind additional testing, as well as the less than 1% risk of stroke from the procedure.  She has a history of bleeding following cath in the pst.  Durene Cal [phone]

## 2011-12-20 NOTE — Interval H&P Note (Signed)
History and Physical Interval Note:  12/20/2011 9:39 PM  [name]  has presented today for surgery, with the diagnosis of Carotid Stenosis  The various methods of treatment have been discussed with the patient and family. After consideration of risks, benefits and other options for treatment, the patient has consented to  Procedure(s) (LRB): CAROTID ANGIOGRAM (Bilateral) as a surgical intervention .  The patient's history has been reviewed, patient examined, no change in status, stable for surgery.  I have reviewed the patients' chart and labs.  Questions were answered to the patient's satisfaction.     Kortez Murtagh IV, V. WELLS

## 2011-12-21 NOTE — Consult Note (Signed)
[name], [name]              ACCOUNT NO.:  [id number]  MEDICAL RECORD NO.:  [id number]  LOCATION:                                 FACILITY:  PHYSICIAN:  Adriene Padula K. Risa Auman, M.D.DATE OF BIRTH:  [birth year]  DATE OF CONSULTATION: DATE OF DISCHARGE:                                CONSULTATION   CLINICAL HISTORY:  Progressive stenosis of the left internal carotid artery proximally.  EXAMINATION:  Bilateral common carotid interpretations intracranially.  FINDINGS:  The right common carotid arteriogram demonstrates the distal portion of the right internal carotid artery to be normal.  The petrous, the cavernous, and supraclinoid segments are normal.  The right middle cerebral artery opacifies normally into the capillary and the venous phases.  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  The left common carotid arteriogram demonstrates the cervical segment of the left internal carotid artery to be normal.  The petrous, the cavernous, and supraclinoid segments are normal.  The left middle and the left anterior cerebral arteries are opacified normally into the capillary and venous phases.  Cross opacification via the anterior communicating artery of the right anterior cerebral artery A2 segment is noted.  The delayed arterial and the early venous phase demonstrates an area of relative hypoperfusion involving the mid subcortical peri-insular area.  IMPRESSION: 1. Angiographically, no evidence of occlusions, stenosis, however,     dissection or aneurysm seen intracranially. 2. Focal area of hypoperfusion involving the mid peri-insular region     in the midsegment triangle.  States this may represent remote     ischemia.          ______________________________ Grandville Silos. Corliss Skains, M.D.     SKD/MEDQ  D:  12/20/2011  T:  12/21/2011  Job:  [id number]

## 2011-12-24 ENCOUNTER — Telehealth: Payer: Self-pay | Admitting: Surgery

## 2011-12-24 NOTE — Telephone Encounter (Addendum)
Message copied by Shari Prows on Mon Dec 24, 2011  3:07 PM ------      Message from: Melene Plan      Created: Wed Dec 19, 2011  1:31 PM                   ----- Message -----         From: Nada Libman, MD         Sent: 12/19/2011  11:23 AM           To: Reuel Derby, Melene Plan, RN            12/19/2011, the patient had the following procedures:                  Procedure Performed:       1.  ultrasound access right femoral artery       2.  aortic arch angiogram              3.  second order catheterization (right common carotid artery)       4.  right carotid angiogram       5.  first order catheterization (left common carotid artery)       6.  left  Carotid angiogram            Please schedule the patient to come to see me in the office in 2-3 weeks for discussions of a right carotid endarterectomy. She will need to be scheduled for a 30 minute appointment (new)  I spoke to patient regarding above instructed appt. She is aware of appt on 01/14/12 at 9am. I also mailed paperwork to pt. Drinda Butts tobin

## 2011-12-26 ENCOUNTER — Encounter (INDEPENDENT_AMBULATORY_CARE_PROVIDER_SITE_OTHER): Payer: Self-pay

## 2011-12-26 DIAGNOSIS — I4891 Unspecified atrial fibrillation: Secondary | ICD-10-CM

## 2011-12-28 ENCOUNTER — Encounter: Payer: Self-pay | Admitting: Cardiology

## 2011-12-28 ENCOUNTER — Ambulatory Visit (INDEPENDENT_AMBULATORY_CARE_PROVIDER_SITE_OTHER): Payer: Self-pay | Admitting: Cardiology

## 2011-12-28 VITALS — BP 146/96 | HR 75 | Ht 66.0 in | Wt 189.0 lb

## 2011-12-28 DIAGNOSIS — I639 Cerebral infarction, unspecified: Secondary | ICD-10-CM

## 2011-12-28 DIAGNOSIS — I10 Essential (primary) hypertension: Secondary | ICD-10-CM

## 2011-12-28 DIAGNOSIS — I6529 Occlusion and stenosis of unspecified carotid artery: Secondary | ICD-10-CM

## 2011-12-28 DIAGNOSIS — E7800 Pure hypercholesterolemia, unspecified: Secondary | ICD-10-CM

## 2011-12-28 DIAGNOSIS — E785 Hyperlipidemia, unspecified: Secondary | ICD-10-CM

## 2011-12-28 DIAGNOSIS — I6350 Cerebral infarction due to unspecified occlusion or stenosis of unspecified cerebral artery: Secondary | ICD-10-CM

## 2011-12-28 DIAGNOSIS — I2510 Atherosclerotic heart disease of native coronary artery without angina pectoris: Secondary | ICD-10-CM

## 2011-12-28 MED ORDER — LISINOPRIL 10 MG PO TABS: 10.0000 mg | ORAL_TABLET | Freq: Two times a day (BID) | ORAL | Status: DC

## 2011-12-28 MED ORDER — SIMVASTATIN 40 MG PO TABS: 40.0000 mg | ORAL_TABLET | Freq: Every day | ORAL | Status: DC

## 2011-12-28 NOTE — Patient Instructions (Addendum)
Your physician recommends that you schedule a follow-up appointment in: 2 MONTHS WITH DR [facility]  Your physician has requested that you have en exercise stress myoview. For further information please visit [email or web address] Please follow instruction sheet, as given.   INCREASE LISINOPRIL 10 MG TAKE ONE TABLET TWICE DAILY   STOP PRAVASTATIN  START SIMVASTATIN 40 MG ONCE DAILY AT BEDTIME  Your physician recommends that you return for lab work in: ONE [facility]  Your physician recommends that you return for lab work in: 2 MONTHS- FASTING-LIPID/LIVER    REFERRAL TO PCP FOR DIABETES

## 2011-12-30 DIAGNOSIS — I6529 Occlusion and stenosis of unspecified carotid artery: Secondary | ICD-10-CM | POA: Insufficient documentation

## 2011-12-30 NOTE — Assessment & Plan Note (Signed)
She needs better BP control.  I will increase lisinopril to 10 mg bid.  BMET in 2 wks.

## 2011-12-30 NOTE — Progress Notes (Signed)
Patient ID: [name], female   DOB: 1955-07-14, 56 y.o.   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  Patient was admitted in 6/13 with confusion and memory difficulty.  MRI showed multiple small bilateral infarcts.  There was severe right carotid stenosis, but this does not explain the bilateral infarcts.  TEE showed a mobile calcified structure attached to the mitral valve.  There was heavy mitral annular calcification.  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  It was thought that this structure could be a potential source of embolism and Plavix was started.    From a neurologic standpoint, she seems to have completely recovered.  She denies chest pain.  She has had significantly worsened exertional dyspnea since the spring.  She is now short of breath after walking 1/4 mile or less or walking briskly up steps.  BP is running high today.  She says that this is actually a "good" number for her and that it tends to run much higher.  No claudication.  She is going to have right CEA in the near future.   Labs (6/13): lupus anticoagulant negative, anticardiolipin antibody negative, LDL 135, creatinine 0.77, blood cultures NGTD PMH: 1. Diabetes mellitus  2. Hyperlipidemia 3. HTN 4. CVA: 6/13 admission with confusion and memory problems.  MRI showed multiple small bilateral infarcts c/w emboli.  MRA neck showed high grade RICA stenosis.  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  It was thought that the RICA stenosis could not explain the bilateral small strokes and concern was for emboli from the mobile calcified mitral annular structure.  5. Carotid stenosis: 85-90% RICA on 6/13 cerebral angiogram.   6. CAD s/p PCI x 2 in 2004 in [place].   SH: Lives with husband on  farm near [place].  Moved here from [place] for husband's work.  Now unemployed.  Nonsmoker.  Jeh[employer].   FH: Mother with valvular heart disease.  Brother and sister with heart "trouble," she is not sure exactly what.   ROS: All systems reviewed and negative except as per HPI.   Current Outpatient Prescriptions  Medication Sig Dispense Refill  . clopidogrel (PLAVIX) 75 MG tablet Take 1 tablet (75 mg total) by mouth daily.  30 tablet  0  . insulin aspart protamine-insulin aspart (NOVOLOG 70/30) (70-30) 100 UNIT/ML injection Inject 18 Units into the skin 2 (two) times daily with a meal.  10 mL  0  . lisinopril (PRINIVIL,ZESTRIL) 10 MG tablet Take 1 tablet (10 mg total) by mouth 2 (two) times daily.  60 tablet  12  . simvastatin (ZOCOR) 40 MG tablet Take 1 tablet (40 mg total) by mouth at bedtime.  30 tablet  12    BP 146/96  Pulse 75  Ht 5\' 6"  (1.676 m)  Wt 85.73 kg (189 lb)  BMI 30.51 kg/m2  SpO2 99%'. General: NAD Neck: No JVD, no thyromegaly or thyroid nodule.  Lungs: Clear to auscultation bilaterally with normal respiratory effort. CV: Nondisplaced PMI.  Heart regular S1/S2, no S3/S4, no murmur.  No peripheral edema.  Right carotid bruit.  Normal pedal pulses.  Abdomen: Soft, nontender, no hepatosplenomegaly, no distention.  Skin: Intact without lesions or rashes.  Neurologic: Alert and oriented x 3.  Psych: Normal affect. Extremities: No clubbing or cyanosis.  HEENT: Normal.

## 2011-12-30 NOTE — Assessment & Plan Note (Addendum)
Increase simvastatin to 40 mg daily with lipids/LFTs in 2 months.  Goal LDL < 70.   She needs a PCP to follow her diabetes.  I will try to get her in with the [facility] or Internal Medicine clinic.  She needs to apply for Medicaid.

## 2011-12-30 NOTE — Assessment & Plan Note (Signed)
PCI in 2004.  New exertional dyspnea over the last few months but no chest pain.  I will get an ETT-myoview.  She will continue Plavix, ACEI, and statin.

## 2011-12-30 NOTE — Assessment & Plan Note (Signed)
Severe RICA stenosis.  Probably not the cause of the bilateral cerebral infarcts, but still needs to be fixed.  She is going to undergo right CEA.

## 2011-12-30 NOTE — Assessment & Plan Note (Addendum)
Multiple bilateral small infarcts.  This is not explained by RICA stenosis.  I am concerned that she had emboli from the mobile structure on the mitral valve annulus.   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  Blood cultures were negative.  She is on Plavix, which I think is probably the appropriate intervention.  She is also wearing a 3 week event monitor to screen for atrial fibrillation.

## 2012-01-04 ENCOUNTER — Other Ambulatory Visit: Payer: Self-pay

## 2012-01-07 ENCOUNTER — Telehealth: Payer: Self-pay | Admitting: Family Medicine

## 2012-01-07 ENCOUNTER — Other Ambulatory Visit: Payer: Self-pay | Admitting: *Deleted

## 2012-01-07 ENCOUNTER — Other Ambulatory Visit (INDEPENDENT_AMBULATORY_CARE_PROVIDER_SITE_OTHER): Payer: Self-pay

## 2012-01-07 DIAGNOSIS — I2510 Atherosclerotic heart disease of native coronary artery without angina pectoris: Secondary | ICD-10-CM

## 2012-01-07 DIAGNOSIS — R0989 Other specified symptoms and signs involving the circulatory and respiratory systems: Secondary | ICD-10-CM

## 2012-01-07 DIAGNOSIS — E875 Hyperkalemia: Secondary | ICD-10-CM

## 2012-01-07 DIAGNOSIS — I10 Essential (primary) hypertension: Secondary | ICD-10-CM

## 2012-01-07 LAB — LIPID PANEL
Cholesterol: 161 mg/dL (ref 0–200)
VLDL: 14 mg/dL (ref 0.0–40.0)

## 2012-01-07 LAB — HEPATIC FUNCTION PANEL
ALT: 19 U/L (ref 0–35)
AST: 23 U/L (ref 0–37)
Alkaline Phosphatase: 85 U/L (ref 39–117)
Bilirubin, Direct: 0.1 mg/dL (ref 0.0–0.3)
Total Protein: 7.3 g/dL (ref 6.0–8.3)

## 2012-01-07 LAB — BASIC METABOLIC PANEL
BUN: 20 mg/dL (ref 6–23)
BUN: 24 mg/dL — ABNORMAL HIGH (ref 6–23)
Calcium: 9.7 mg/dL (ref 8.4–10.5)
Creatinine, Ser: 0.9 mg/dL (ref 0.4–1.2)
GFR: 65.38 mL/min (ref >60.00)
GFR: 67.87 mL/min (ref >60.00)
Potassium: 5.9 mEq/L — ABNORMAL HIGH (ref 3.5–5.1)
Potassium: 5 mEq/L (ref 3.5–5.1)
Sodium: 143 mEq/L (ref 135–145)

## 2012-01-07 NOTE — Telephone Encounter (Signed)
Patient has a new patient appointment with you on 02/13/12.  Patient has found a lump in her left breast and she wanted to know if you could see her sooner.

## 2012-01-07 NOTE — Telephone Encounter (Signed)
May place her Wednesday at noon or next Monday at 2pm slot and block 2:15 and 2:30.

## 2012-01-08 ENCOUNTER — Other Ambulatory Visit: Payer: Self-pay | Admitting: *Deleted

## 2012-01-08 DIAGNOSIS — E875 Hyperkalemia: Secondary | ICD-10-CM

## 2012-01-09 ENCOUNTER — Encounter: Payer: Self-pay | Admitting: Family Medicine

## 2012-01-09 ENCOUNTER — Ambulatory Visit (INDEPENDENT_AMBULATORY_CARE_PROVIDER_SITE_OTHER): Payer: Self-pay | Admitting: Family Medicine

## 2012-01-09 VITALS — BP 180/92 | HR 80 | Temp 98.3°F | Ht 65.5 in | Wt 188.5 lb

## 2012-01-09 DIAGNOSIS — I6529 Occlusion and stenosis of unspecified carotid artery: Secondary | ICD-10-CM

## 2012-01-09 DIAGNOSIS — I6340 Cerebral infarction due to embolism of unspecified cerebral artery: Secondary | ICD-10-CM

## 2012-01-09 DIAGNOSIS — E785 Hyperlipidemia, unspecified: Secondary | ICD-10-CM

## 2012-01-09 DIAGNOSIS — I2510 Atherosclerotic heart disease of native coronary artery without angina pectoris: Secondary | ICD-10-CM

## 2012-01-09 DIAGNOSIS — N63 Unspecified lump in unspecified breast: Secondary | ICD-10-CM

## 2012-01-09 DIAGNOSIS — I10 Essential (primary) hypertension: Secondary | ICD-10-CM

## 2012-01-09 DIAGNOSIS — E1165 Type 2 diabetes mellitus with hyperglycemia: Secondary | ICD-10-CM

## 2012-01-09 DIAGNOSIS — N6320 Unspecified lump in the left breast, unspecified quadrant: Secondary | ICD-10-CM

## 2012-01-09 DIAGNOSIS — I639 Cerebral infarction, unspecified: Secondary | ICD-10-CM

## 2012-01-09 MED ORDER — INSULIN ASPART PROT & ASPART (70-30 MIX) 100 UNIT/ML ~~LOC~~ SUSP: 18.0000 [IU] | Freq: Two times a day (BID) | SUBCUTANEOUS | Status: DC

## 2012-01-09 MED ORDER — SIMVASTATIN 40 MG PO TABS: 40.0000 mg | ORAL_TABLET | Freq: Every day | ORAL | Status: DC

## 2012-01-09 MED ORDER — CLOPIDOGREL BISULFATE 75 MG PO TABS: 75.0000 mg | ORAL_TABLET | Freq: Every day | ORAL | Status: DC

## 2012-01-09 NOTE — Progress Notes (Signed)
E-scribe error. Simvastatin called into [facility].

## 2012-01-09 NOTE — Patient Instructions (Addendum)
For blood pressure - keep track every morning - if consistently >160/100, call me.  Otherwise bring a log of blood pressures to next visit. For sugars - continue novolog 70/30 18 units twice daily.  1 week prior to next appointment keep log of sugars twice daily -fasting and then throughout the day and bring to appointment. Call us with name of cardiology practice so we can get records from [place]. For breast- pass by Marion's office to schedule diagnostic mammogram and ultrasound. Good to see you today ,call us with questions.

## 2012-01-09 NOTE — Progress Notes (Signed)
Subjective:    Patient ID: [name], female    DOB: [birth year], 56 y.o.   MRN: [id number]  HPI CC: new pt to establish  Moved from [place] 6 yrs ago - husband lost his job, then had to move back to [place] so husband lives in [place] currently, looking for job in [place].  Has not seen neurologist.  Prior did not have PCP.  Vegan since early 2013.  Does not take multivitamins.  Recent admission to [facility] for embolic stroke 11/2011 - reviewed chart.  MRI showed multiple small bilateral infarcts. Was off aspirin and plavix prior to stroke.  Pending appt with [facility] for R CEA given severe R carotid artery stenosis but this was not thought to be cause of strokes as bilateral.  TEE showed a mobile calcified structure attached to the mitral valve and heavy mitral annular calcification.  It was thought that this could have been the source.  Denies h/o afib or palpitations, EKG NSR 86 11/2011.  Was placed on 3 wk event monitor.  plavix was started.  EEG was normal pointing against seizures.  Was told to f/u with neurology in August.  DM - dx 25 yrs ago.  Started with gestational diabetes.  Last vision screen - 1 yr ago.  Had right cataract removed with lens implant.  Checks sugars intermittently, not daily.  Checks when feels ill.  Test strips too expensive.  No low sugars but has had in past.  Taking novolog 70/30 18u bid.  Has not been on metformin in past.  Last sugar checked several nights ago, was 167.  Taking novolog 70/30 18u bid.  Had been taking "over the counter insulin" prior to stroke last month.  States intolerant of several oral antihyperglycemics including metformin. Lab Results  Component Value Date   HGBA1C 11.6* 12/12/2011   H/o CAD - s/p 2 stents in [place], never on plavix.  Was on baby aspirin but not regularly.  I have requested records from prior cardiologist and endocrinologist.  HTN - states bp tends to be elevated at doctor's office.  But at home around 140 systolic.  Has BP cuff at home.   Compliant with lisinopril 10mg  bid. BP Readings from Last 3 Encounters:  01/09/12 180/92  12/28/11 146/96  12/19/11 127/69    1 wk ago found lump in left breast - not tender.  Would like breast exam today.  Never on HRT.  LMP and menopause - around 2007 at age 10.  No family hx breast cancer.  Has been losing weight without trying over last several months.  However has decreased amount of soda consumption.  Prior weighed >200lbs  Body mass index is 30.89 kg/(m^2). Wt Readings from Last 3 Encounters:  01/09/12 188 lb 8 oz (85.503 kg)  12/28/11 189 lb (85.73 kg)  12/19/11 170 lb (77.111 kg)   Caffeine: 2 cups coffee/day Lives alone, husband in [place].  4 children nearby, son and his family to move in with her. Occupation: unemployed, prior worked as IT consultant.  Looking into disability Activity: stays active on farm Diet: vegan, no processed foods, good water daily, fruits/vegetables daily  Preventative: No recent CPE (thinks around 2004) No recent mammo.  Last one ~2000, abnormal but f/u normal.  Never needed biopsy in past. No recent pap - states always normal. Colon screening - has not had screening.  No blood in stool, BM changes, no fmhx colon cancer. Pneumovax 1999  Review of Systems  Constitutional: Negative for fever, chills, activity change,  appetite change, fatigue and unexpected weight change.  HENT: Negative for hearing loss and neck pain.   Eyes: Negative for visual disturbance.  Respiratory: Negative for cough, chest tightness, shortness of breath and wheezing.   Cardiovascular: Negative for chest pain, palpitations and leg swelling.  Gastrointestinal: Negative for nausea, vomiting, abdominal pain, diarrhea, constipation, blood in stool and abdominal distention.  Genitourinary: Negative for hematuria and difficulty urinating.  Musculoskeletal: Negative for myalgias and arthralgias.  Skin: Negative for rash.  Neurological: Negative for dizziness, seizures, syncope and  headaches.  Hematological: Does not bruise/bleed easily.  Psychiatric/Behavioral: Positive for dysphoric mood. The patient is nervous/anxious.        Objective:   Physical Exam  Nursing note and vitals reviewed. Constitutional: She is oriented to person, place, and time. She appears well-developed and well-nourished. No distress.  HENT:  Head: Normocephalic and atraumatic.  Right Ear: Hearing, tympanic membrane, external ear and ear canal normal.  Left Ear: Hearing, tympanic membrane, external ear and ear canal normal.  Nose: Nose normal.  Mouth/Throat: Oropharynx is clear and moist. No oropharyngeal exudate.  Eyes: Conjunctivae and EOM are normal. Pupils are equal, round, and reactive to light. No scleral icterus.  Neck: Normal range of motion. Neck supple. No thyromegaly present.  Cardiovascular: Normal rate, regular rhythm, normal heart sounds and intact distal pulses.   No murmur heard. Pulses:      Radial pulses are 2+ on the right side, and 2+ on the left side.  Pulmonary/Chest: Effort normal and breath sounds normal. No respiratory distress. She has no wheezes. She has no rales. Right breast exhibits no inverted nipple, no mass, no nipple discharge, no skin change and no tenderness. Left breast exhibits mass (firm somewhat mobile oblong mass about 1cm size at 6 o clock position 3cm inferior to nipple). Left breast exhibits no inverted nipple, no nipple discharge, no skin change and no tenderness. Breasts are symmetrical.    Abdominal: Soft. Bowel sounds are normal. She exhibits no distension and no mass. There is no tenderness. There is no rebound and no guarding.  Musculoskeletal: Normal range of motion. She exhibits no edema.  Lymphadenopathy:    She has no cervical adenopathy.    She has no axillary adenopathy.       Right axillary: No pectoral and no lateral adenopathy present.       Left axillary: No pectoral and no lateral adenopathy present.      Right: No  supraclavicular adenopathy present.       Left: No supraclavicular adenopathy present.  Neurological: She is alert and oriented to person, place, and time.       CN grossly intact, station and gait intact  Skin: Skin is warm and dry. No rash noted.  Psychiatric: She has a normal mood and affect. Her behavior is normal. Judgment and thought content normal.      Assessment & Plan:

## 2012-01-10 ENCOUNTER — Telehealth: Payer: Self-pay

## 2012-01-10 ENCOUNTER — Encounter: Payer: Self-pay | Admitting: Family Medicine

## 2012-01-10 DIAGNOSIS — N6320 Unspecified lump in the left breast, unspecified quadrant: Secondary | ICD-10-CM | POA: Insufficient documentation

## 2012-01-10 NOTE — Telephone Encounter (Signed)
Patient notified. ROI faxed.

## 2012-01-10 NOTE — Assessment & Plan Note (Signed)
Asxs, knows will need to continue plavix.

## 2012-01-10 NOTE — Telephone Encounter (Signed)
i'd start with tylenol 500mg  three times a day, if not helping may take ibuprofen up to 600mg  at a time but no more than 3 times a day (and trying to minimize ibuprofen as it can affect kidney and possibly raise blood pressure).  Both can be found over the counter.

## 2012-01-10 NOTE — Assessment & Plan Note (Signed)
Has f/u with [facility].

## 2012-01-10 NOTE — Assessment & Plan Note (Signed)
Chronic, uncontrolled. Discussed difficulty in titrating insulin without having cbg readings. Pt will bring 1 wk's worth log of cbgs bid to next appt and will titrate accordingly. States intolerant to several oral meds in past, including metformin. I have requested records from endo in [place].

## 2012-01-10 NOTE — Assessment & Plan Note (Signed)
Neurologically seems intact. Will schedule her for f/u with neurology in August. Continue plavix.

## 2012-01-10 NOTE — Assessment & Plan Note (Signed)
Significantly elevated today, however meeting new physician and pt states at home has been running 140s systolic. Asked her to keep track of BP over next several days and call me if consistently >160/90 for increase in blood pressure meds, would start by adding hctz. BP Readings from Last 3 Encounters:  01/09/12 180/92  12/28/11 146/96  12/19/11 127/69

## 2012-01-10 NOTE — Assessment & Plan Note (Signed)
Chronic. Lab Results  Component Value Date   CHOL 161 01/07/2012   HDL 52.40 01/07/2012   LDLCALC 95 01/07/2012   TRIG 70.0 01/07/2012   CHOLHDL 3 01/07/2012  goal LDL <16.  Simvastatin recently increased.

## 2012-01-10 NOTE — Assessment & Plan Note (Signed)
New, noticed 1 wk ago. Firm mass at [facility]. Will schedule diagnostic mammogram/ultrasound to further evaluate. Obvious concern is malignancy. Not significant h/o fibrocystic breast or significant caffeine use.  No significant fmhx breast cancer.

## 2012-01-10 NOTE — Telephone Encounter (Signed)
Pt called info for Cardiologist in [facility]; Cardiovascular consultants; [address], [address] [place] [address]. Phone [phone] and fax [phone]; info given to [facility]. Pt has arthritis; now having shooting pain in right hand with middle finger swollen;   Pt on Plavix and wants to know what can take for pain.[facility] [address] Please advise.

## 2012-01-11 ENCOUNTER — Encounter: Payer: Self-pay | Admitting: Surgery

## 2012-01-14 ENCOUNTER — Encounter: Payer: Self-pay | Admitting: Surgery

## 2012-01-14 ENCOUNTER — Ambulatory Visit (INDEPENDENT_AMBULATORY_CARE_PROVIDER_SITE_OTHER): Payer: No Typology Code available for payment source | Admitting: Surgery

## 2012-01-14 ENCOUNTER — Ambulatory Visit
Admission: RE | Admit: 2012-01-14 | Discharge: 2012-01-14 | Disposition: A | Discharge status: Home / Self Care | Payer: No Typology Code available for payment source | Source: Ambulatory Visit | Attending: Family Medicine | Admitting: Family Medicine

## 2012-01-14 VITALS — BP 186/89 | HR 76 | Temp 98.2°F | Ht 65.5 in | Wt 189.0 lb

## 2012-01-14 DIAGNOSIS — I6529 Occlusion and stenosis of unspecified carotid artery: Secondary | ICD-10-CM

## 2012-01-14 DIAGNOSIS — N6320 Unspecified lump in the left breast, unspecified quadrant: Secondary | ICD-10-CM

## 2012-01-14 NOTE — Progress Notes (Signed)
Today for followup. She initially presented to [facility] and late June with a history of confusion and short-term memory difficulty. She did not have any focal extremity numbness or weakness. A MRI revealed multiple bilateral infarcts. She was also found to have carotid disease bilaterally. She underwent a transesophageal echo which showed a calcified area on a valve which provided a possible explanation of her strokes. I proceeded with a carotid angiogram which revealed a 85% right carotid stenosis and a left carotid stenosis of less than 50%. I believe that she is asymptomatic from her carotid disease however due to the stenosis I brought her back today to discuss our options. She reports no new symptoms since her discharge. She was discharged home on aspirin and Plavix. She has since been established with a primary care physician. A breast nodule was found and she is scheduled to undergo mammography tomorrow.  I spent greater than one hour reviewing the patient's images and discussing our options with her today. We discussed that with a stenosis of greater than 80% her risk of stroke would be less if we intervene versus continuing to treat her medically. We discussed carotid stenting as well as carotid endarterectomy. At this point, she wishes to proceed with surgery. We talked about the length of stay, the risks of the procedure which include the risk of stroke, the risk of nerve injury, the risk of cardiopulmonary complications, and the risk of bleeding. All of her questions were answered today. She will contact me with her desire date for surgery.

## 2012-01-17 ENCOUNTER — Other Ambulatory Visit (INDEPENDENT_AMBULATORY_CARE_PROVIDER_SITE_OTHER): Payer: Self-pay

## 2012-01-17 DIAGNOSIS — E875 Hyperkalemia: Secondary | ICD-10-CM

## 2012-01-17 LAB — BASIC METABOLIC PANEL
Calcium: 9.5 mg/dL (ref 8.4–10.5)
Chloride: 108 mEq/L (ref 96–112)
Creatinine, Ser: 0.8 mg/dL (ref 0.4–1.2)
GFR: 74.43 mL/min (ref >60.00)

## 2012-01-28 ENCOUNTER — Encounter: Payer: Self-pay | Admitting: Cardiology

## 2012-01-28 ENCOUNTER — Ambulatory Visit (HOSPITAL_COMMUNITY): Payer: Self-pay | Attending: Cardiology | Admitting: Radiology

## 2012-01-28 ENCOUNTER — Encounter: Payer: 59 | Admitting: Surgery

## 2012-01-28 VITALS — BP 160/104 | HR 75 | Ht 65.5 in | Wt 189.0 lb

## 2012-01-28 DIAGNOSIS — I10 Essential (primary) hypertension: Secondary | ICD-10-CM | POA: Insufficient documentation

## 2012-01-28 DIAGNOSIS — I2510 Atherosclerotic heart disease of native coronary artery without angina pectoris: Secondary | ICD-10-CM

## 2012-01-28 DIAGNOSIS — R0989 Other specified symptoms and signs involving the circulatory and respiratory systems: Secondary | ICD-10-CM | POA: Insufficient documentation

## 2012-01-28 DIAGNOSIS — E785 Hyperlipidemia, unspecified: Secondary | ICD-10-CM | POA: Insufficient documentation

## 2012-01-28 DIAGNOSIS — Z01810 Encounter for preprocedural cardiovascular examination: Secondary | ICD-10-CM

## 2012-01-28 DIAGNOSIS — R0602 Shortness of breath: Secondary | ICD-10-CM

## 2012-01-28 DIAGNOSIS — E119 Type 2 diabetes mellitus without complications: Secondary | ICD-10-CM | POA: Insufficient documentation

## 2012-01-28 DIAGNOSIS — Z87891 Personal history of nicotine dependence: Secondary | ICD-10-CM | POA: Insufficient documentation

## 2012-01-28 DIAGNOSIS — R000 Tachycardia, unspecified: Secondary | ICD-10-CM | POA: Insufficient documentation

## 2012-01-28 DIAGNOSIS — I6529 Occlusion and stenosis of unspecified carotid artery: Secondary | ICD-10-CM

## 2012-01-28 DIAGNOSIS — R0609 Other forms of dyspnea: Secondary | ICD-10-CM | POA: Insufficient documentation

## 2012-01-28 DIAGNOSIS — R5383 Other fatigue: Secondary | ICD-10-CM | POA: Insufficient documentation

## 2012-01-28 DIAGNOSIS — R5381 Other malaise: Secondary | ICD-10-CM | POA: Insufficient documentation

## 2012-01-28 DIAGNOSIS — Z8673 Personal history of transient ischemic attack (TIA), and cerebral infarction without residual deficits: Secondary | ICD-10-CM | POA: Insufficient documentation

## 2012-01-28 DIAGNOSIS — I779 Disorder of arteries and arterioles, unspecified: Secondary | ICD-10-CM | POA: Insufficient documentation

## 2012-01-28 DIAGNOSIS — Z794 Long term (current) use of insulin: Secondary | ICD-10-CM | POA: Insufficient documentation

## 2012-01-28 MED ORDER — TECHNETIUM TC 99M TETROFOSMIN IV KIT
30.0000 | PACK | Freq: Once | INTRAVENOUS | Status: AC | PRN
  Administered 2012-01-28: 30 via INTRAVENOUS

## 2012-01-28 MED ORDER — TECHNETIUM TC 99M TETROFOSMIN IV KIT
10.0000 | PACK | Freq: Once | INTRAVENOUS | Status: AC | PRN
  Administered 2012-01-28: 10 via INTRAVENOUS

## 2012-01-28 MED ORDER — REGADENOSON 0.4 MG/5ML IV SOLN
0.4000 mg | Freq: Once | INTRAVENOUS | Status: AC
  Administered 2012-01-28: 0.4 mg via INTRAVENOUS

## 2012-01-28 NOTE — Progress Notes (Addendum)
[facility] 3 NUCLEAR MED [address] [place] [address] [phone]  Cardiology Nuclear Med Study  [name] is a 56 y.o. female     MRN : [id number]     DOB: Jan 08, 1956  Procedure Date: 01/28/2012  Nuclear Med Background Indication for Stress Test:  Evaluation for Ischemia and Pending Surgical Clearance for Right CEA by Dr. Durene Cal History:  ~34yrs ago MPS:OK per patient; '04 Stent x 2; 12/14/11 Echo:EF=55-60%. Cardiac Risk Factors: Carotid Disease, CVA, History of Smoking, Hypertension, IDDM Type 2 and Lipids  Symptoms:  DOE, Fatigue and Rapid HR   Nuclear Pre-Procedure Caffeine/Decaff Intake:  None NPO After: 7:00pm   Lungs:  Clear. O2 Sat: 98% on room air. IV 0.9% NS with Angio Cath:  20g  IV Site: R Antecubital  IV Started by:  Stanton Kidney, EMT-P  Chest Size (in):  40 Cup Size: D  Height: 5' 5.5" (1.664 m)  Weight:  189 lb (85.73 kg)  BMI:  Body mass index is 30.97 kg/(m^2). Tech Comments:  Pt. advised she took half dose of Insulin this am, CBG=290 @ 9am.    Nuclear Med Study 1 or 2 day study: 1 day  Stress Test Type:  Lexiscan  Reading MD: Willa Rough, MD  Order Authorizing Provider:  [name], MD  Resting Radionuclide: Technetium 75m Tetrofosmin  Resting Radionuclide Dose: 10.9 mCi   Stress Radionuclide:  Technetium 32m Tetrofosmin  Stress Radionuclide Dose: 32.9 mCi           Stress Protocol Rest HR: 75 Stress HR: 105  Rest BP: 160/104 Stress BP: 170/90  Exercise Time (min): n/a METS: n/a   Predicted Max HR: 164 bpm % Max HR: 64.02 bpm Rate Pressure Product: 95284   Dose of Adenosine (mg):  n/a Dose of Lexiscan: 0.4 mg  Dose of Atropine (mg): n/a Dose of Dobutamine: n/a mcg/kg/min (at max HR)  Stress Test Technologist: Smiley Houseman, CMA-N  Nuclear Technologist:  Doyne Keel, CNMT     Rest Procedure:  Myocardial perfusion imaging was performed at rest 45 minutes following the intravenous administration of  Technetium 65m Tetrofosmin.  Rest ECG: No acute changes.  Stress Procedure:  The patient received IV Lexiscan 0.4 mg over 15-seconds.  Technetium 46m Tetrofosmin injected at 30-seconds.  There were no significant changes with Lexiscan.  She did c/o throat tightness with Lexiscan.  Quantitative spect images were obtained after a 45 minute delay.  Stress ECG: No significant change from baseline ECG  QPS Raw Data Images:  Patient motion noted; appropriate software correction applied. Stress Images:  There is apical thinning. There is a small area of decreased activity at the basal segment of the inferior wall. The degree of photon reduction is Mild. Rest Images:  There is a small area of decreased activity the basal segment of the inferior wall. The degree of photon reduction is mild. Subtraction (SDS):  No reversibility is appreciated. Transient Ischemic Dilatation (Normal <1.22):  1.03 Lung/Heart Ratio (Normal <0.45):  0.28  Quantitative Gated Spect Images QGS EDV:  85 ml QGS ESV:  40 ml  Impression Exercise Capacity:  Lexiscan with no exercise. BP Response:  Normal blood pressure response. Clinical Symptoms:  throat tightness ECG Impression:  No significant ST segment change suggestive of ischemia. Comparison with Prior Nuclear Study: No images to compare  Overall Impression:  Low risk stress nuclear study. There is suggestion of mild scar at the base of the inferior wall. There is no significant  ischemia  LV Ejection Fraction: 53%.  LV Wall Motion:  Mild hypokinesis at the base of the inferior wall.  Willa Rough, MD   Scar inferior base suggestive of prior MI, no ischemia.  Please inform patient.   [name] 01/29/2012

## 2012-01-29 NOTE — Progress Notes (Signed)
Nuclear results routed  for review. [name], [name]

## 2012-01-29 NOTE — Progress Notes (Signed)
Pt.notified

## 2012-01-31 ENCOUNTER — Telehealth: Payer: Self-pay | Admitting: *Deleted

## 2012-01-31 NOTE — Telephone Encounter (Signed)
Dr Shirlee Latch reviewed monitor done 12/26/11-01/15/12. NSR. No at fibrillation. Pt is aware of these results

## 2012-02-02 ENCOUNTER — Encounter: Payer: Self-pay | Admitting: Family Medicine

## 2012-02-13 ENCOUNTER — Ambulatory Visit: Payer: Self-pay | Admitting: Family Medicine

## 2012-02-14 ENCOUNTER — Ambulatory Visit: Payer: Self-pay | Admitting: Family Medicine

## 2012-02-21 ENCOUNTER — Ambulatory Visit: Payer: Self-pay | Admitting: Family Medicine

## 2012-02-21 NOTE — ED Provider Notes (Signed)
Order(s) created erroneously. Erroneous order ID: [id number] Order moved by: Osie Cheeks Order move date/time: 02/21/2012 11:24 AM Source Patient:    [id number] Source Contact: 01/28/2012 Destination Patient:    [id number] Destination Contact: 01/28/2012

## 2012-02-21 NOTE — ED Provider Notes (Signed)
Order(s) created erroneously. Erroneous order ID: [id number] Order moved by: Radley Barto M Order move date/time: 02/21/2012 11:24 AM Source Patient:    [id number] Source Contact: 01/28/2012 Destination Patient:    [id number] Destination Contact: 01/28/2012

## 2012-02-28 ENCOUNTER — Other Ambulatory Visit: Payer: Self-pay

## 2012-02-28 ENCOUNTER — Ambulatory Visit: Payer: Self-pay | Admitting: Cardiology

## 2012-03-04 ENCOUNTER — Ambulatory Visit: Payer: Self-pay | Admitting: Family Medicine

## 2012-03-05 ENCOUNTER — Encounter: Payer: Self-pay | Admitting: Cardiology

## 2012-03-05 ENCOUNTER — Other Ambulatory Visit (INDEPENDENT_AMBULATORY_CARE_PROVIDER_SITE_OTHER): Payer: Self-pay

## 2012-03-05 ENCOUNTER — Ambulatory Visit (INDEPENDENT_AMBULATORY_CARE_PROVIDER_SITE_OTHER): Payer: Self-pay | Admitting: Cardiology

## 2012-03-05 VITALS — BP 150/82 | HR 76 | Ht 65.5 in | Wt 185.0 lb

## 2012-03-05 DIAGNOSIS — I6529 Occlusion and stenosis of unspecified carotid artery: Secondary | ICD-10-CM

## 2012-03-05 DIAGNOSIS — I6340 Cerebral infarction due to embolism of unspecified cerebral artery: Secondary | ICD-10-CM

## 2012-03-05 DIAGNOSIS — I2510 Atherosclerotic heart disease of native coronary artery without angina pectoris: Secondary | ICD-10-CM

## 2012-03-05 DIAGNOSIS — I639 Cerebral infarction, unspecified: Secondary | ICD-10-CM

## 2012-03-05 DIAGNOSIS — I10 Essential (primary) hypertension: Secondary | ICD-10-CM

## 2012-03-05 DIAGNOSIS — E785 Hyperlipidemia, unspecified: Secondary | ICD-10-CM

## 2012-03-05 DIAGNOSIS — R0989 Other specified symptoms and signs involving the circulatory and respiratory systems: Secondary | ICD-10-CM

## 2012-03-05 NOTE — Patient Instructions (Addendum)
Your physician recommends that you have  a FASTING lipid profile /liver profile today.  Your physician wants you to follow-up in: 6 months  with Dr Shirlee Latch. (March  2014). You will receive a reminder letter in the mail two months in advance. If you don't receive a letter, please call our office to schedule the follow-up appointment.

## 2012-03-06 LAB — HEPATIC FUNCTION PANEL
Alkaline Phosphatase: 93 U/L (ref 39–117)
Bilirubin, Direct: 0.1 mg/dL (ref 0.0–0.3)
Total Bilirubin: 0.8 mg/dL (ref 0.3–1.2)
Total Protein: 7.1 g/dL (ref 6.0–8.3)

## 2012-03-06 LAB — LIPID PANEL
Cholesterol: 170 mg/dL (ref 0–200)
LDL Cholesterol: 100 mg/dL — ABNORMAL HIGH (ref 0–99)

## 2012-03-06 NOTE — Progress Notes (Signed)
Patient ID: [name], female   DOB: [birth year], 56 y.o.   MRN: [id number] PCP: Dr. Sharen Hones  56 y.o. with history of CAD s/p PCI in [place] in 2004, diabetes, and recent CVA presents for cardiology followup.  Patient was admitted in 6/13 with confusion and memory difficulty.  MRI showed multiple small bilateral infarcts.  There was severe right carotid stenosis, but this does not explain the bilateral infarcts.  TEE showed a mobile calcified structure attached to the mitral valve.  There was heavy mitral annular calcification.  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  It was thought that this structure could be a potential source of embolism and Plavix was started.  3 week event monitor showed no atrial fibrillation.   From a neurologic standpoint, she seems to have completely recovered.  She denies chest pain.  No claudication.  She is going to have right CEA in the near future.  At last appointment, she reported significant exertional dyspnea.  Lexiscan myoview in 7/13 shows a small old infarct at the inferior base but no ischemia.  Her lisinopril was increased due to elevated BP.  BP is still high in the office today, but she checks it daily at home and it has been running in the 120s-130s systolic.  Dyspnea seems to have improved since last appointment.  She does have a history of asthma and thinks that her asthma is under better control now.   Labs (6/13): lupus anticoagulant negative, anticardiolipin antibody negative, LDL 135, creatinine 0.77, blood cultures NGTD PMH: 1. Diabetes mellitus  2. Hyperlipidemia 3. HTN 4. CVA: 6/13 admission with confusion and memory problems.  MRI showed multiple small bilateral infarcts c/w emboli.  MRA neck showed high grade RICA stenosis.  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  It was thought  that the RICA stenosis could not explain the bilateral small strokes and concern was for emboli from the mobile calcified mitral annular structure.  3-week event monitor (8/13) showed no atrial fibrillation.  5. Carotid stenosis: 85-90% RICA on 6/13 cerebral angiogram.   6. CAD s/p PCI x 2 in 2004 in [place].  Lexiscan myoview (7/13) showed small basal inferior scar with no ischemia, EF 53%, inferobasal hypokinesis.   7. Asthma  SH: Lives with husband on farm near [place].  Moved here from [place] for husband's work.  Now unemployed.  Nonsmoker.  Jehovah's Witness.   FH: Mother with valvular heart disease.  Brother and sister with heart "trouble," she is not sure exactly what.   ROS: All systems reviewed and negative except as per HPI.   Current Outpatient Prescriptions  Medication Sig Dispense Refill  . clopidogrel (PLAVIX) 75 MG tablet Take 1 tablet (75 mg total) by mouth daily.  30 tablet  11  . insulin aspart protamine-insulin aspart (NOVOLOG 70/30) (70-30) 100 UNIT/ML injection Inject 18 Units into the skin 2 (two) times daily with a meal. Qs 1 mo  10 mL  12  . lisinopril (PRINIVIL,ZESTRIL) 10 MG tablet Take 1 tablet (10 mg total) by mouth 2 (two) times daily.  60 tablet  12  . simvastatin (ZOCOR) 40 MG tablet Take 1 tablet (40 mg total) by mouth at bedtime.  30 tablet  12  . [place] JOH[place] WORT PO Take by mouth at bedtime as needed.  BP 150/82  Pulse 76  Ht 5' 5.5" (1.664 m)  Wt 185 lb (83.915 kg)  BMI 30.32 kg/m2  SpO2 98%'. General: NAD Neck: No JVD, no thyromegaly or thyroid nodule.  Lungs: Clear to auscultation bilaterally with normal respiratory effort. CV: Nondisplaced PMI.  Heart regular S1/S2, no S3/S4, no murmur.  No peripheral edema.  Right > left carotid bruit.  Normal pedal pulses.  Abdomen: Soft, nontender, no hepatosplenomegaly, no distention.   Neurologic: Alert and oriented x 3.  Psych: Normal affect. Extremities: No clubbing or cyanosis.   Assessment/Plan  1. CAD  (coronary artery disease)  PCI in 2004. Low risk Lexiscan myoview in 7/13 with no ischemia.  She denies chest pain and exertional dyspnea has improved, possibly due to less symptomatic asthma.  Continue Plavix, statin, ACEI.  2. CVA (cerebral infarction)  Multiple bilateral small infarcts. This is not explained by RICA stenosis. I am concerned that she had emboli from the mobile structure on the mitral valve annulus. 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 Blood cultures were negative. She is on Plavix, which I think is probably the appropriate intervention. 3 week event monitor in 8/13 did not show any atrial fibrillation.  3. Hypertension BP control has been better on higher lisinopril dose, continue.  4. Carotid stenosis  Severe RICA stenosis. Probably not the cause of the bilateral cerebral infarcts, but still needs to be fixed. She is going to undergo right CEA.  5. Hyperlipidemia Check lipids/LFTs today with goal LDL < 70.   Dalton [employer]

## 2012-03-07 ENCOUNTER — Other Ambulatory Visit: Payer: Self-pay | Admitting: *Deleted

## 2012-03-07 DIAGNOSIS — I2510 Atherosclerotic heart disease of native coronary artery without angina pectoris: Secondary | ICD-10-CM

## 2012-03-07 DIAGNOSIS — E785 Hyperlipidemia, unspecified: Secondary | ICD-10-CM

## 2012-03-07 MED ORDER — ATORVASTATIN CALCIUM 40 MG PO TABS: 40.0000 mg | ORAL_TABLET | Freq: Every day | ORAL | Status: DC

## 2012-03-14 ENCOUNTER — Telehealth: Payer: Self-pay | Admitting: *Deleted

## 2012-03-14 NOTE — Telephone Encounter (Signed)
Unable to schedule surgery at present.Mother needs surgery in [place] and father has alzheimer so she is going up there to help. She is also applying for disability and medicaid to help pay expenses with surgery.She will call us back to schedule Right CEA. I reiterated the signs and symptoms of a stoke and to go to ER if has any. She verbalized understanding.

## 2012-03-26 ENCOUNTER — Other Ambulatory Visit: Payer: Self-pay

## 2012-04-11 ENCOUNTER — Encounter (HOSPITAL_COMMUNITY): Payer: Self-pay | Admitting: Pharmacy Technician

## 2012-04-11 ENCOUNTER — Telehealth: Payer: Self-pay | Admitting: Cardiology

## 2012-04-11 NOTE — Telephone Encounter (Signed)
Pt aware appt scheduled  with Tereso Newcomer, PAc 04/14/12.

## 2012-04-11 NOTE — Telephone Encounter (Signed)
New Problem:     Patient called in to let you know that her carotid surgery is on 04/24/12 and that she has been having chest pains and intermittent mild left arm pain for the past 2 weeks. Please call back.

## 2012-04-11 NOTE — Telephone Encounter (Signed)
Pt rtn call to anne , pls call [phone]

## 2012-04-11 NOTE — Telephone Encounter (Signed)
Spoke with pt. Pt states in the past 2 weeks she has had pain in her chest and arm with exertion. Reviewed with Dr Shirlee Latch. He recommended appt with PA prior to surgery.

## 2012-04-11 NOTE — Pre-Procedure Instructions (Signed)
20 [name]  04/11/2012   Your procedure is scheduled on:  Thurs, Oct 31 @ 7:30 AM  Report to [facility] at 5:30 AM.  Call this number if you have problems the morning of surgery: [phone]   Remember:   Do not eat food:After Midnight.       Do not wear jewelry, make-up or nail polish.  Do not wear lotions, powders, or perfumes. You may wear deodorant.  Do not shave 48 hours prior to surgery.   Do not bring valuables to the hospital.  Contacts, dentures or bridgework may not be worn into surgery.  Leave suitcase in the car. After surgery it may be brought to your room.  For patients admitted to the hospital, checkout time is 11:00 AM the day of discharge.   Patients discharged the day of surgery will not be allowed to drive home.  Special Instructions: Shower using CHG 2 nights before surgery and the night before surgery.  If you shower the day of surgery use CHG.  Use special wash - you have one bottle of CHG for all showers.  You should use approximately 1/3 of the bottle for each shower.   Please read over the following fact sheets that you were given: Pain Booklet, Coughing and Deep Breathing, MRSA Information and Surgical Site Infection Prevention

## 2012-04-14 ENCOUNTER — Encounter (HOSPITAL_COMMUNITY): Payer: Self-pay

## 2012-04-14 ENCOUNTER — Encounter: Payer: Self-pay | Admitting: Physician Assistant

## 2012-04-14 ENCOUNTER — Ambulatory Visit (INDEPENDENT_AMBULATORY_CARE_PROVIDER_SITE_OTHER): Payer: Self-pay | Admitting: Physician Assistant

## 2012-04-14 ENCOUNTER — Encounter: Payer: Self-pay | Admitting: *Deleted

## 2012-04-14 ENCOUNTER — Encounter (HOSPITAL_COMMUNITY)
Admission: RE | Admit: 2012-04-14 | Discharge: 2012-04-14 | Disposition: A | Discharge status: Home / Self Care | Payer: Self-pay | Source: Ambulatory Visit | Attending: Surgery | Admitting: Surgery

## 2012-04-14 VITALS — BP 130/80 | HR 93 | Ht 65.0 in | Wt 193.0 lb

## 2012-04-14 DIAGNOSIS — I2510 Atherosclerotic heart disease of native coronary artery without angina pectoris: Secondary | ICD-10-CM

## 2012-04-14 DIAGNOSIS — E785 Hyperlipidemia, unspecified: Secondary | ICD-10-CM

## 2012-04-14 DIAGNOSIS — I6529 Occlusion and stenosis of unspecified carotid artery: Secondary | ICD-10-CM

## 2012-04-14 DIAGNOSIS — I10 Essential (primary) hypertension: Secondary | ICD-10-CM

## 2012-04-14 DIAGNOSIS — I6340 Cerebral infarction due to embolism of unspecified cerebral artery: Secondary | ICD-10-CM

## 2012-04-14 DIAGNOSIS — I208 Other forms of angina pectoris: Secondary | ICD-10-CM

## 2012-04-14 DIAGNOSIS — I209 Angina pectoris, unspecified: Secondary | ICD-10-CM

## 2012-04-14 DIAGNOSIS — I639 Cerebral infarction, unspecified: Secondary | ICD-10-CM

## 2012-04-14 HISTORY — DX: Major depressive disorder, single episode, unspecified: F32.9

## 2012-04-14 HISTORY — DX: Unspecified cataract: H26.9

## 2012-04-14 HISTORY — DX: Depression, unspecified: F32.A

## 2012-04-14 HISTORY — DX: Polyneuropathy, unspecified: G62.9

## 2012-04-14 LAB — COMPREHENSIVE METABOLIC PANEL
Alkaline Phosphatase: 110 U/L (ref 39–117)
BUN: 11 mg/dL (ref 6–23)
Chloride: 103 mEq/L (ref 96–112)
GFR calc Af Amer: >90 mL/min (ref >90)
Glucose, Bld: 203 mg/dL — ABNORMAL HIGH (ref 70–99)
Potassium: 4.1 mEq/L (ref 3.5–5.1)
Total Bilirubin: 0.6 mg/dL (ref 0.3–1.2)
Total Protein: 7.5 g/dL (ref 6.0–8.3)

## 2012-04-14 LAB — CBC
HCT: 37.4 % (ref 36.0–46.0)
Hemoglobin: 12.9 g/dL (ref 12.0–15.0)
MCHC: 34.5 g/dL (ref 30.0–36.0)
MCV: 82.9 fL (ref 78.0–100.0)

## 2012-04-14 LAB — URINALYSIS, ROUTINE W REFLEX MICROSCOPIC
Ketones, ur: NEGATIVE mg/dL
Leukocytes, UA: NEGATIVE
Nitrite: NEGATIVE
Protein, ur: NEGATIVE mg/dL
Urobilinogen, UA: 0.2 mg/dL (ref 0.0–1.0)

## 2012-04-14 LAB — APTT: aPTT: 29 seconds (ref 24–37)

## 2012-04-14 MED ORDER — NITROGLYCERIN 0.4 MG SL SUBL: 0.4000 mg | SUBLINGUAL_TABLET | SUBLINGUAL | Status: DC | PRN

## 2012-04-14 MED ORDER — ISOSORBIDE MONONITRATE ER 30 MG PO TB24: 30.0000 mg | ORAL_TABLET | Freq: Every day | ORAL | Status: DC

## 2012-04-14 NOTE — Progress Notes (Signed)
Average fasting blood sugar runs 200-300  Humulin N is what pt is taking b/c she can't see medical md d/t being unable to pay him,so she has not been taking 70/30 in over a month

## 2012-04-14 NOTE — Patient Instructions (Addendum)
Your physician has recommended you make the following change in your medication: START IMDUR 30 MG DAILY; A PRESCRIPTION FOR NITROGLYCERIN HAS BEEN SENT IN TODAY TO [facility], YOU HAVE BEEN ADVISED ON HOW AND WHEN TO USE NITROGLYCERIN  Your physician has requested that you have a LEFT cardiac catheterization THIS WILL BE ON Monday 04/21/12 WITH DR. Shirlee Latch @ 9:30 AM. Cardiac catheterization is used to diagnose and/or treat various heart conditions. Doctors may recommend this procedure for a number of different reasons. The most common reason is to evaluate chest pain. Chest pain can be a symptom of coronary artery disease (CAD), and cardiac catheterization can show whether plaque is narrowing or blocking your heart's arteries. This procedure is also used to evaluate the valves, as well as measure the blood flow and oxygen levels in different parts of your heart. For further information please visit [email or web address] Please follow instruction sheet, as given.

## 2012-04-14 NOTE — Progress Notes (Signed)
[address], [address] [place], [place]  [address] Phone: [phone], Fax:  [phone]  Date:  04/14/2012   Name:  [name]   DOB:  [birth year]   MRN:  [id number]  PCP:  Eustaquio Boyden, MD  Primary Cardiologist:  Dr. [name]  Primary Electrophysiologist:  None    History of Present Illness: [name] is a 56 y.o. female who returns for evaluation of chest pain.  She has a hx of CAD s/p PCI in [place] in 2004, DM2, and recent CVA.  Patient was admitted in 6/13 with confusion and memory difficulty. MRI showed multiple small bilateral infarcts. There was severe right carotid stenosis, but this does not explain the bilateral infarcts. TEE showed a mobile calcified structure attached to the mitral valve. There was heavy mitral annular calcification. 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 It was thought that this structure could be a potential source of embolism and Plavix was started. 3 week event monitor showed no atrial fibrillation.   She needs to have right CEA in the near future with Dr. Myra Gianotti.  Dr. Shirlee Latch arranged stress testing this summer due to dyspnea.  Lexiscan myoview in 7/13 showed a small old infarct at the inferior base but no ischemia.   Over the last 2-3 weeks she has started to note a left chest ache with radiation to her left arm with exertion.  [place]  She has to walk down a large hill and back up.  This is where she usually notes symptoms.  No rest symptoms.  No assoc dyspnea, nausea or diaphoresis.  No orthopnea, PND, edema.  No syncope.  Of note, she had PCI in 2004 after an abnormal stress test which was done as part of her annual physical exam.  She has never had anginal symptoms.    Labs (6/13): lupus anticoagulant negative, anticardiolipin antibody negative, LDL 135, creatinine 0.77, blood cultures NGTD  Labs (9/13):    LDL 100 Labs (10/13):  K 4.1, creatinine 0.74, ALT 20, Hgb  12.9   Wt Readings from Last 3 Encounters:  04/14/12 193 lb (87.544 kg)  04/14/12 193 lb 12.8 oz (87.907 kg)  03/05/12 185 lb (83.915 kg)     Past Medical History:   1. Diabetes mellitus  2. Hyperlipidemia  3. HTN  4. CVA: 6/13 admission with confusion and memory problems. MRI showed multiple small bilateral infarcts c/w emboli. MRA neck showed high grade RICA stenosis. 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 It was thought that the RICA stenosis could not explain the bilateral small strokes and concern was for emboli from the mobile calcified mitral annular structure. 3-week event monitor (8/13) showed no atrial fibrillation.   5. Carotid stenosis: 85-90% RICA on 6/13 cerebral angiogram.  6. CAD s/p PCI x 2 in 2004 in [place]. Lexiscan myoview (7/13) showed small basal inferior scar with no ischemia, EF 53%, inferobasal hypokinesis.  7. Asthma 8. Depression 9. Jehovah's Witness 10. Nephrolithiasis 11. Low Back Pain 12. Anxiety 13. Neuropathy  14. Arthritis 15. Cataracts  16. Eczema  Current Outpatient Prescriptions  Medication Sig Dispense Refill  . atorvastatin (LIPITOR) 40 MG tablet Take 40 mg by mouth daily.      . clopidogrel (PLAVIX) 75 MG tablet Take 75 mg by mouth daily.      Marland Kitchen  insulin NPH (HUMULIN N,NOVOLIN N) 100 UNIT/ML injection Inject into the skin. SLIDING SCALE      . lisinopril (PRINIVIL,ZESTRIL) 10 MG tablet Take 10 mg by mouth 2 (two) times daily.        Allergies: Allergies  Allergen Reactions  . Penicillins Itching and Other (See Comments)    From when younger  . Metformin And Related Diarrhea and Nausea And Vomiting  . Toradol (Ketorolac Tromethamine) Other (See Comments)    Panic attack     Social History:   reports that she quit smoking about 36 years ago. Her smoking use included Cigarettes. She has a 3 pack-year smoking  history. She has never used smokeless tobacco. She reports that she does not drink alcohol or use illicit drugs.   Family History:  family history includes Diabetes in her father; Heart disease in her brother, mother, and sister; Hypertension in her father and mother; Stroke in her mother; and Stroke (age of onset:52) in her brother.  There is no history of Cancer.   ROS:  Please see the history of present illness.   All other systems reviewed and negative.   PHYSICAL EXAM: VS:  BP 130/80  Pulse 93  Ht 5\' 5"  (1.651 m)  Wt 193 lb (87.544 kg)  BMI 32.12 kg/m2 Well nourished, well developed, in no acute distress HEENT: normal Neck: no JVD Cardiac:  normal S1, S2; RRR; no murmur Lungs:  clear to auscultation bilaterally, no wheezing, rhonchi or rales Abd: soft, nontender, no hepatomegaly Ext: no edema Skin: warm and dry Neuro:  CNs 2-12 intact, no focal abnormalities noted  EKG:  NSR, HR 93, normal axis, poor R wave progression, and no significant ST changes      ASSESSMENT AND PLAN:  1. Chest Pain:  Symptoms concerning for exertional angina.  D/w Dr. [name].  Will arrange left heart cath to further evaluate.  Add Imdur 30 mg QD and renew NTG Rx.  Risks and benefits of cardiac catheterization have been discussed with the patient.  These include bleeding, infection, kidney damage, stroke, heart attack, death.  The patient understands these risks and is willing to proceed.   2. Coronary Artery Disease:  Arrange LHC as noted.  Continue Plavix and statin.  3. Carotid Stenosis:  Right CEA is pending.    4. 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    5. Hypertension:  Controlled.  Continue current therapy.  6. Hyperlipidemia:  Continue statin.    Signed, Tereso Newcomer, PA-C  4:15 PM 04/14/2012

## 2012-04-14 NOTE — H&P (Signed)
History and Physical  Date:  04/14/2012   Name:  [name]   DOB:  07-Dec-1955   MRN:  [id number]  PCP:  Eustaquio Boyden, MD  Primary Cardiologist:  Dr. [name]  Primary Electrophysiologist:  None    History of Present Illness: [name] is a 56 y.o. female who returns for evaluation of chest pain.  She has a hx of CAD s/p PCI in [place] in 2004, DM2, and recent CVA.  Patient was admitted in 6/13 with confusion and memory difficulty. MRI showed multiple small bilateral infarcts. There was severe right carotid stenosis, but this does not explain the bilateral infarcts. TEE showed a mobile calcified structure attached to the mitral valve. There was heavy mitral annular calcification. 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 It was thought that this structure could be a potential source of embolism and Plavix was started. 3 week event monitor showed no atrial fibrillation.   She needs to have right CEA in the near future with Dr. Myra Gianotti.  Dr. Shirlee Latch arranged stress testing this summer due to dyspnea.  Lexiscan myoview in 7/13 showed a small old infarct at the inferior base but no ischemia.   Over the last 2-3 weeks she has started to note a left chest ache with radiation to her left arm with exertion.  [place]  She has to walk down a large hill and back up.  This is where she usually notes symptoms.  No rest symptoms.  No assoc dyspnea, nausea or diaphoresis.  No orthopnea, PND, edema.  No syncope.  Of note, she had PCI in 2004 after an abnormal stress test which was done as part of her annual physical exam.  She has never had anginal symptoms.    Labs (6/13): lupus anticoagulant negative, anticardiolipin antibody negative, LDL 135, creatinine 0.77, blood cultures NGTD  Labs (9/13):    LDL 100 Labs (10/13):  K 4.1, creatinine 0.74, ALT 20, Hgb 12.9   Wt Readings from Last 3 Encounters:  04/14/12 193 lb (87.544 kg)  04/14/12 193 lb  12.8 oz (87.907 kg)  03/05/12 185 lb (83.915 kg)     Past Medical History:   1. Diabetes mellitus  2. Hyperlipidemia  3. HTN  4. CVA: 6/13 admission with confusion and memory problems. MRI showed multiple small bilateral infarcts c/w emboli. MRA neck showed high grade RICA stenosis. 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 It was thought that the RICA stenosis could not explain the bilateral small strokes and concern was for emboli from the mobile calcified mitral annular structure. 3-week event monitor (8/13) showed no atrial fibrillation.   5. Carotid stenosis: 85-90% RICA on 6/13 cerebral angiogram.  6. CAD s/p PCI x 2 in 2004 in [place]. Lexiscan myoview (7/13) showed small basal inferior scar with no ischemia, EF 53%, inferobasal hypokinesis.  7. Asthma 8. Depression 9. Jehovah's Witness 10. Nephrolithiasis 11. Low Back Pain 12. Anxiety 13. Neuropathy  14. Arthritis 15. Cataracts  16. Eczema  Current Outpatient Prescriptions  Medication Sig Dispense Refill  . atorvastatin (LIPITOR) 40 MG tablet Take 40 mg by mouth daily.      . clopidogrel (PLAVIX) 75 MG tablet Take 75 mg by mouth daily.      . insulin NPH (HUMULIN N,NOVOLIN N) 100 UNIT/ML injection Inject  into the skin. SLIDING SCALE      . lisinopril (PRINIVIL,ZESTRIL) 10 MG tablet Take 10 mg by mouth 2 (two) times daily.        Allergies: Allergies  Allergen Reactions  . Penicillins Itching and Other (See Comments)    From when younger  . Metformin And Related Diarrhea and Nausea And Vomiting  . Toradol (Ketorolac Tromethamine) Other (See Comments)    Panic attack     Social History:   reports that she quit smoking about 36 years ago. Her smoking use included Cigarettes. She has a 3 pack-year smoking history. She has never used smokeless tobacco. She reports that she does not drink alcohol or  use illicit drugs.   Family History:  family history includes Diabetes in her father; Heart disease in her brother, mother, and sister; Hypertension in her father and mother; Stroke in her mother; and Stroke (age of onset:52) in her brother.  There is no history of Cancer.   ROS:  Please see the history of present illness.   All other systems reviewed and negative.   PHYSICAL EXAM: VS:  BP 130/80  Pulse 93  Ht 5\' 5"  (1.651 m)  Wt 193 lb (87.544 kg)  BMI 32.12 kg/m2 Well nourished, well developed, in no acute distress HEENT: normal Neck: no JVD Cardiac:  normal S1, S2; RRR; no murmur Lungs:  clear to auscultation bilaterally, no wheezing, rhonchi or rales Abd: soft, nontender, no hepatomegaly Ext: no edema Skin: warm and dry Neuro:  CNs 2-12 intact, no focal abnormalities noted  EKG:  NSR, HR 93, normal axis, poor R wave progression, and no significant ST changes      ASSESSMENT AND PLAN:  1. Chest Pain:  Symptoms concerning for exertional angina.  D/w Dr. [name].  Will arrange left heart cath to further evaluate.  Add Imdur 30 mg QD and renew NTG Rx.  2. Coronary Artery Disease:  Arrange LHC as noted.  Continue Plavix and statin.  3. Carotid Stenosis:  Right CEA is pending.    4. 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    5. Hypertension:  Controlled.  Continue current therapy.  6. Hyperlipidemia:  Continue statin.    Signed, Tereso Newcomer, PA-C  4:15 PM 04/14/2012

## 2012-04-14 NOTE — Progress Notes (Addendum)
Cardiologist is Dr.MClean and to see him this afternoon @ 3:40 d/t mild chest pain and left arm pain  Echo in epic from 2002/2013 Stress test done with report in epic from 01/29/12 Heart cath report in epic from 12/19/11  EKG in epic from 12/15/11 CXR in epic from 11/2011 Medical MD is [facility]-Dr.Gutierrez

## 2012-04-15 ENCOUNTER — Telehealth: Payer: Self-pay | Admitting: *Deleted

## 2012-04-15 ENCOUNTER — Ambulatory Visit (INDEPENDENT_AMBULATORY_CARE_PROVIDER_SITE_OTHER): Payer: Self-pay | Admitting: *Deleted

## 2012-04-15 ENCOUNTER — Other Ambulatory Visit: Payer: Self-pay | Admitting: *Deleted

## 2012-04-15 DIAGNOSIS — E785 Hyperlipidemia, unspecified: Secondary | ICD-10-CM

## 2012-04-15 DIAGNOSIS — I639 Cerebral infarction, unspecified: Secondary | ICD-10-CM

## 2012-04-15 DIAGNOSIS — I208 Other forms of angina pectoris: Secondary | ICD-10-CM

## 2012-04-15 DIAGNOSIS — I2510 Atherosclerotic heart disease of native coronary artery without angina pectoris: Secondary | ICD-10-CM

## 2012-04-15 DIAGNOSIS — I10 Essential (primary) hypertension: Secondary | ICD-10-CM

## 2012-04-15 DIAGNOSIS — I6529 Occlusion and stenosis of unspecified carotid artery: Secondary | ICD-10-CM

## 2012-04-15 DIAGNOSIS — R079 Chest pain, unspecified: Secondary | ICD-10-CM

## 2012-04-15 DIAGNOSIS — I6340 Cerebral infarction due to embolism of unspecified cerebral artery: Secondary | ICD-10-CM

## 2012-04-15 LAB — CBC WITH DIFFERENTIAL/PLATELET
Basophils Absolute: 0 10*3/uL (ref 0.0–0.1)
Basophils Relative: 0.3 % (ref 0.0–3.0)
Eosinophils Absolute: 0.3 10*3/uL (ref 0.0–0.7)
Hemoglobin: 12.7 g/dL (ref 12.0–15.0)
Lymphocytes Relative: 35.8 % (ref 12.0–46.0)
MCHC: 32.5 g/dL (ref 30.0–36.0)
MCV: 87.8 fl (ref 78.0–100.0)
Monocytes Absolute: 0.3 10*3/uL (ref 0.1–1.0)
Neutro Abs: 3.5 10*3/uL (ref 1.4–7.7)
RBC: 4.43 Mil/uL (ref 3.87–5.11)
RDW: 13.1 % (ref 11.5–14.6)

## 2012-04-15 LAB — PROTIME-INR: Prothrombin Time: 10.2 s (ref 10.2–12.4)

## 2012-04-15 NOTE — Telephone Encounter (Signed)
I s/w pt about her lab work from yesterday. [facility] never came and picked up the blood and blood work was never spun since [facility] was expected to pick up labs, these were pre cath labs being done. K+ 8, this is most likely not correct due to blood was spun this morning per Madlyn Frankel lab tech; pt to repeat bmet sometime before this Friday am, said she haas to have family member bring her. I advised has to be done no later than 10/25 AM due to cath 04/21/12; pt verbalized understanding today

## 2012-04-15 NOTE — Addendum Note (Signed)
Addended by: Tonita Phoenix on: 04/15/2012 08:39 AM   Modules accepted: Orders

## 2012-04-16 ENCOUNTER — Other Ambulatory Visit (INDEPENDENT_AMBULATORY_CARE_PROVIDER_SITE_OTHER): Payer: Self-pay

## 2012-04-16 ENCOUNTER — Telehealth: Payer: Self-pay | Admitting: *Deleted

## 2012-04-16 DIAGNOSIS — R079 Chest pain, unspecified: Secondary | ICD-10-CM

## 2012-04-16 LAB — BASIC METABOLIC PANEL
BUN: 12 mg/dL (ref 6–23)
Calcium: 9.4 mg/dL (ref 8.4–10.5)
Chloride: 106 mEq/L (ref 96–112)
Creatinine, Ser: 0.9 mg/dL (ref 0.4–1.2)
GFR: 69.56 mL/min (ref >60.00)

## 2012-04-16 NOTE — Telephone Encounter (Signed)
Message copied by Tarri Fuller on Wed Apr 16, 2012  2:16 PM ------      Message from: [place], [place] T      Created: Wed Apr 16, 2012  1:13 PM       Please notify patient that the lab results are ok.      Patient is a known diabetic.       Tereso Newcomer, PA-C  1:13 PM 04/16/2012

## 2012-04-16 NOTE — Telephone Encounter (Signed)
pt notified about lab results w/verbal understanding today 

## 2012-04-21 ENCOUNTER — Encounter: Payer: Self-pay | Admitting: Family Medicine

## 2012-04-21 ENCOUNTER — Other Ambulatory Visit: Payer: Self-pay

## 2012-04-21 ENCOUNTER — Ambulatory Visit (HOSPITAL_COMMUNITY)
Admission: AD | Admit: 2012-04-21 | Discharge: 2012-04-22 | Disposition: A | Discharge status: Home / Self Care | Payer: Self-pay | Source: Ambulatory Visit | Attending: Cardiovascular Disease | Admitting: Cardiovascular Disease

## 2012-04-21 ENCOUNTER — Encounter (HOSPITAL_BASED_OUTPATIENT_CLINIC_OR_DEPARTMENT_OTHER)
Admission: RE | Disposition: A | Discharge status: Hospice | Payer: Self-pay | Source: Ambulatory Visit | Attending: Cardiology

## 2012-04-21 ENCOUNTER — Inpatient Hospital Stay (HOSPITAL_BASED_OUTPATIENT_CLINIC_OR_DEPARTMENT_OTHER)
Admission: RE | Admit: 2012-04-21 | Discharge: 2012-04-21 | Disposition: A | Discharge status: Hospice | Payer: MEDICAID | Source: Ambulatory Visit | Attending: Cardiology | Admitting: Cardiology

## 2012-04-21 ENCOUNTER — Encounter (HOSPITAL_COMMUNITY)
Admission: AD | Disposition: A | Discharge status: Home / Self Care | Payer: Self-pay | Source: Ambulatory Visit | Attending: Cardiovascular Disease

## 2012-04-21 DIAGNOSIS — I10 Essential (primary) hypertension: Secondary | ICD-10-CM | POA: Insufficient documentation

## 2012-04-21 DIAGNOSIS — I208 Other forms of angina pectoris: Secondary | ICD-10-CM

## 2012-04-21 DIAGNOSIS — E119 Type 2 diabetes mellitus without complications: Secondary | ICD-10-CM | POA: Insufficient documentation

## 2012-04-21 DIAGNOSIS — Z9861 Coronary angioplasty status: Secondary | ICD-10-CM

## 2012-04-21 DIAGNOSIS — E785 Hyperlipidemia, unspecified: Secondary | ICD-10-CM

## 2012-04-21 DIAGNOSIS — Z8673 Personal history of transient ischemic attack (TIA), and cerebral infarction without residual deficits: Secondary | ICD-10-CM | POA: Insufficient documentation

## 2012-04-21 DIAGNOSIS — I639 Cerebral infarction, unspecified: Secondary | ICD-10-CM

## 2012-04-21 DIAGNOSIS — I2510 Atherosclerotic heart disease of native coronary artery without angina pectoris: Secondary | ICD-10-CM | POA: Insufficient documentation

## 2012-04-21 DIAGNOSIS — I200 Unstable angina: Secondary | ICD-10-CM | POA: Insufficient documentation

## 2012-04-21 DIAGNOSIS — I6529 Occlusion and stenosis of unspecified carotid artery: Secondary | ICD-10-CM | POA: Insufficient documentation

## 2012-04-21 DIAGNOSIS — Z01812 Encounter for preprocedural laboratory examination: Secondary | ICD-10-CM | POA: Insufficient documentation

## 2012-04-21 HISTORY — PX: CORONARY ANGIOPLASTY: SHX604

## 2012-04-21 HISTORY — PX: PERCUTANEOUS CORONARY STENT INTERVENTION (PCI-S): SHX5485

## 2012-04-21 HISTORY — DX: Acute myocardial infarction, unspecified: I21.9

## 2012-04-21 LAB — GLUCOSE, CAPILLARY: Glucose-Capillary: 103 mg/dL — ABNORMAL HIGH (ref 70–99)

## 2012-04-21 LAB — POCT ACTIVATED CLOTTING TIME: Activated Clotting Time: 604 seconds

## 2012-04-21 SURGERY — JV LEFT HEART CATHETERIZATION WITH CORONARY ANGIOGRAM
Anesthesia: Moderate Sedation

## 2012-04-21 SURGERY — PERCUTANEOUS CORONARY STENT INTERVENTION (PCI-S)
Anesthesia: LOCAL

## 2012-04-21 MED ORDER — NITROGLYCERIN 0.4 MG SL SUBL: 0.4000 mg | SUBLINGUAL_TABLET | SUBLINGUAL | Status: DC | PRN

## 2012-04-21 MED ORDER — ACETAMINOPHEN 325 MG PO TABS: 650.0000 mg | ORAL_TABLET | ORAL | Status: DC | PRN

## 2012-04-21 MED ORDER — FENTANYL CITRATE 0.05 MG/ML IJ SOLN
INTRAMUSCULAR | Status: AC
  Filled 2012-04-21: qty 2

## 2012-04-21 MED ORDER — SODIUM CHLORIDE 0.9 % IV SOLN: INTRAVENOUS | Status: AC

## 2012-04-21 MED ORDER — ISOSORBIDE MONONITRATE ER 30 MG PO TB24
30.0000 mg | ORAL_TABLET | Freq: Every day | ORAL | Status: DC
  Filled 2012-04-21 (×2): qty 1

## 2012-04-21 MED ORDER — ONDANSETRON HCL 4 MG/2ML IJ SOLN: 4.0000 mg | Freq: Four times a day (QID) | INTRAMUSCULAR | Status: DC | PRN

## 2012-04-21 MED ORDER — MIDAZOLAM HCL 2 MG/2ML IJ SOLN
INTRAMUSCULAR | Status: AC
  Filled 2012-04-21: qty 2

## 2012-04-21 MED ORDER — CLOPIDOGREL BISULFATE 300 MG PO TABS: 300.0000 mg | ORAL_TABLET | Freq: Once | ORAL | Status: DC

## 2012-04-21 MED ORDER — INSULIN ASPART 100 UNIT/ML ~~LOC~~ SOLN
0.0000 [IU] | Freq: Three times a day (TID) | SUBCUTANEOUS | Status: DC
Start: 2012-04-21 — End: 2012-04-22
  Administered 2012-04-21: 1 [IU] via SUBCUTANEOUS

## 2012-04-21 MED ORDER — ATORVASTATIN CALCIUM 40 MG PO TABS
40.0000 mg | ORAL_TABLET | Freq: Every day | ORAL | Status: DC
  Administered 2012-04-21: 40 mg via ORAL
  Filled 2012-04-21 (×2): qty 1

## 2012-04-21 MED ORDER — LISINOPRIL 10 MG PO TABS
10.0000 mg | ORAL_TABLET | Freq: Two times a day (BID) | ORAL | Status: DC
  Administered 2012-04-21 (×2): 10 mg via ORAL
  Filled 2012-04-21 (×4): qty 1

## 2012-04-21 MED ORDER — VERAPAMIL HCL 2.5 MG/ML IV SOLN
INTRAVENOUS | Status: AC
  Filled 2012-04-21: qty 2

## 2012-04-21 MED ORDER — LIDOCAINE HCL (PF) 1 % IJ SOLN
INTRAMUSCULAR | Status: AC
  Filled 2012-04-21: qty 30

## 2012-04-21 MED ORDER — NITROGLYCERIN 0.2 MG/ML ON CALL CATH LAB
INTRAVENOUS | Status: AC
  Filled 2012-04-21: qty 1

## 2012-04-21 MED ORDER — HEPARIN (PORCINE) IN NACL 2-0.9 UNIT/ML-% IJ SOLN
INTRAMUSCULAR | Status: AC
  Filled 2012-04-21: qty 1000

## 2012-04-21 MED ORDER — ASPIRIN 81 MG PO CHEW
81.0000 mg | CHEWABLE_TABLET | Freq: Every day | ORAL | Status: DC
  Administered 2012-04-21: 16:00:00 81 mg via ORAL
  Filled 2012-04-21 (×2): qty 1

## 2012-04-21 MED ORDER — BIVALIRUDIN 250 MG IV SOLR
INTRAVENOUS | Status: AC
  Filled 2012-04-21: qty 250

## 2012-04-21 MED ORDER — CLOPIDOGREL BISULFATE 75 MG PO TABS
75.0000 mg | ORAL_TABLET | Freq: Every day | ORAL | Status: DC
  Filled 2012-04-21: qty 1

## 2012-04-21 NOTE — Progress Notes (Signed)
TR BAND REMOVAL  LOCATION:    right radial  DEFLATED PER PROTOCOL:    yes  TIME BAND OFF / DRESSING APPLIED:    1500   SITE UPON ARRIVAL:    Level 0  SITE AFTER BAND REMOVAL:    Level 0  REVERSE ALLEN'S TEST:     positive  CIRCULATION SENSATION AND MOVEMENT:    Within Normal Limits   yes  COMMENTS:   tolerated procedure well

## 2012-04-21 NOTE — OR Nursing (Signed)
+  Allen's test right hand 

## 2012-04-21 NOTE — Interval H&P Note (Signed)
History and Physical Interval Note:  04/21/2012 9:46 AM  [name]  has presented today for surgery, with the diagnosis of chest pain  The various methods of treatment have been discussed with the patient and family. After consideration of risks, benefits and other options for treatment, the patient has consented to  Procedure(s) (LRB) with comments: JV LEFT HEART CATHETERIZATION WITH CORONARY ANGIOGRAM (N/A) as a surgical intervention .  The patient's history has been reviewed, patient examined, no change in status, stable for surgery.  I have reviewed the patient's chart and labs.  Questions were answered to the patient's satisfaction.     Laynee Lockamy [employer]

## 2012-04-21 NOTE — CV Procedure (Signed)
   Cardiac Catheterization Operative Report  [name] [id number] 10/28/201311:51 AM Eustaquio Boyden, MD  Procedure Performed:  1. PTCA with balloon angioplasty only distal LAD stenosis.    Operator: Verne Carrow, MD  Indication:  56 yo WF with history of CAD, recent chest pain worrisome c/w class 3 angina. Diagnostic cath this am per Dr. Shirlee Latch. Pt found to have a 99% stenosis in the small caliber distal LAD. This vessel was not felt to be large enough for a stent but we felt that balloon angioplasty would be beneficial.                                 Procedure Details: The risks, benefits, complications, treatment options, and expected outcomes were discussed with the patient before I began the PCI.  The patient and/or family concurred with the proposed plan, giving informed consent. The patient was brought upstairs to the main cath lab with a 5 French sheath present in the right radial artery.  The patient was further sedated with Versed and Fentanyl. The right wrist was prepped and draped in the usual manner. 1% lidocaine was used for local anesthesia. The sheath was changed to a 6 [place] system. She had been loaded with Plavix before coming into the main lab. She was given a bolus of Angiomax and a drip was started. When the ACT was greater than 200, I passed a BMW wire down the LAD. I then used a 1.5 x 15 mm balloon x 2 to pre-dilate the distal LAD stenosis. I then used a 2.0 x 8 mm balloon to pre-dilate the more proximal portion of the distal LAD stenosis. The stenosis was taken from 99% down to 40%. The vessel was not large enough for a stent (1.8-2.0 at maximum). The result was favorable.   There were no immediate complications. The patient was taken to the recovery area in stable condition.   Hemodynamic Findings: Central aortic pressure: 187/86  Impression: 1. Successful PTCA with balloon angioplasty only of the distal LAD. (vessel not large enough for a stent).     Recommendations: Continue ASA and Plavix for at least one month. Continue other cardiac meds.        Complications:  None; patient tolerated the procedure well.

## 2012-04-21 NOTE — CV Procedure (Signed)
   Cardiac Catheterization Procedure Note  Name: [name] MRN: [id number] DOB: 11-25-1955  Procedure: Left Heart Cath, Selective Coronary Angiography  Indication: Unstable angina.  Patient has had progressive exertional chest pain over the last 2-3 weeks.    Procedural Details: The right wrist was prepped, draped, and anesthetized with 1% lidocaine. Using the modified Seldinger technique, a 5 French sheath was introduced into the right radial artery. 3 mg of verapamil was administered through the sheath, weight-based unfractionated heparin was administered intravenously. Standard Judkins catheters were used for selective coronary angiography and LV pressure measurement. Catheter exchanges were performed over an exchange length guidewire. There were no immediate procedural complications. A TR band was used for radial hemostasis at the completion of the procedure.  The patient was transferred to the post catheterization recovery area for further monitoring.  Procedural Findings: Hemodynamics: AO 166/76 LV 178/17  Coronary angiography: Coronary dominance: right  Left mainstem: Short, minimal disease.   Left anterior descending (LAD): 30% proximal LAD stenosis. A septal perforator off the mid LAD was severely diseased.  There was 95% stenosis focally in the mid LAD.  The distal LAD was relatively small in caliber.   Left circumflex (LCx): Small OM1, large OM2 supplies significant portion of lateral and inferior wall. Mild luminals.    Right coronary artery (RCA): Relatively small, codominant vessel.  There was pressure damping with engagement of the RCA.  Probably no more than 50% ostial stenosis in the RCA.  This combined with the small vessel caliber lead to damping.  There was a proximal RCA stent with minimal instent restenosis.  40-50% stenosis in the proximal RCA just prior to stent.    Left ventriculography: Not done due to prior concern for cardioembolic CVA from calcified mass  on mitral valve.   Final Conclusions:  95% mid LAD stenosis is the likely cause of patient's symptoms.  Reload 300 mg Plavix.  Dr. Clifton James will intervene today.  I spoke with Dr. Myra Gianotti, will delay carotid endarterectomy that had been scheduled for Thursday.   [name] 04/21/2012, 10:25 AM

## 2012-04-22 ENCOUNTER — Other Ambulatory Visit: Payer: Self-pay

## 2012-04-22 ENCOUNTER — Encounter: Payer: Self-pay | Admitting: Family Medicine

## 2012-04-22 DIAGNOSIS — I209 Angina pectoris, unspecified: Secondary | ICD-10-CM

## 2012-04-22 DIAGNOSIS — I2510 Atherosclerotic heart disease of native coronary artery without angina pectoris: Secondary | ICD-10-CM

## 2012-04-22 LAB — BASIC METABOLIC PANEL
BUN: 12 mg/dL (ref 6–23)
Chloride: 104 mEq/L (ref 96–112)
Glucose, Bld: 246 mg/dL — ABNORMAL HIGH (ref 70–99)
Potassium: 4.6 mEq/L (ref 3.5–5.1)

## 2012-04-22 LAB — CBC
HCT: 37 % (ref 36.0–46.0)
Hemoglobin: 12.3 g/dL (ref 12.0–15.0)
MCHC: 33.2 g/dL (ref 30.0–36.0)

## 2012-04-22 MED ORDER — LISINOPRIL 20 MG PO TABS
20.0000 mg | ORAL_TABLET | Freq: Two times a day (BID) | ORAL | Status: DC
  Administered 2012-04-22: 09:00:00 20 mg via ORAL
  Filled 2012-04-22 (×2): qty 1

## 2012-04-22 MED ORDER — INSULIN ASPART 100 UNIT/ML ~~LOC~~ SOLN
0.0000 [IU] | Freq: Three times a day (TID) | SUBCUTANEOUS | Status: DC
  Administered 2012-04-22: 09:00:00 7 [IU] via SUBCUTANEOUS

## 2012-04-22 MED ORDER — INSULIN ASPART 100 UNIT/ML ~~LOC~~ SOLN: 0.0000 [IU] | Freq: Every day | SUBCUTANEOUS | Status: DC

## 2012-04-22 MED ORDER — BISOPROLOL FUMARATE 5 MG PO TABS: 5.0000 mg | ORAL_TABLET | Freq: Every day | ORAL | Status: DC

## 2012-04-22 MED ORDER — INSULIN ASPART 100 UNIT/ML ~~LOC~~ SOLN: 0.0000 [IU] | Freq: Three times a day (TID) | SUBCUTANEOUS | Status: DC

## 2012-04-22 MED ORDER — LISINOPRIL 20 MG PO TABS: 20.0000 mg | ORAL_TABLET | Freq: Two times a day (BID) | ORAL | Status: DC

## 2012-04-22 MED ORDER — ASPIRIN 81 MG PO TBEC: 81.0000 mg | DELAYED_RELEASE_TABLET | Freq: Every day | ORAL | Status: DC

## 2012-04-22 MED ORDER — BISOPROLOL FUMARATE 5 MG PO TABS
5.0000 mg | ORAL_TABLET | Freq: Every day | ORAL | Status: DC
  Administered 2012-04-22: 10:00:00 5 mg via ORAL
  Filled 2012-04-22 (×2): qty 1

## 2012-04-22 MED FILL — Dextrose Inj 5%: INTRAVENOUS | Qty: 50 | Status: AC

## 2012-04-22 NOTE — Discharge Summary (Signed)
Discharge Summary   Patient ID: [name] MRN: [id number], DOB/AGE: May 06, 1956 56 y.o. Admit date: 04/21/2012 D/C date:     04/22/2012  Primary Cardiologist: Shirlee Latch  Primary Discharge Diagnoses:  1. CAD with unstable angina - s/p balloon angioplasty to LAD 04/21/12 (vessel too small for stent) - h/o PCI [place] 2. HTN 3. 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 Carotid disease  - CEA delayed for now 5. Diabetes mellitus  Secondary Discharge Diagnoses:  1. Hyperlipidemia 2. Asthma 3. Depression 4. Jehovah's Witness 5. Nephrolithiasis 6. Low back pain 7. Anxiety 8. Neuropathy 9. Arthritis 10. Cataracts 11. Eczema 12. Migraine headaches  Hospital Course: [name] is a 56 yo F who presented to the office 04/14/12 for evaluation of chest pain. She was recently admitted 11/2011 with confusion and memory difficulty. MRI showed multiple small bilateral infarcts. There was severe right carotid stenosis, but this does not explain the bilateral infarcts. TEE showed a mobile calcified structure attached to the mitral valve with heavy mitral annular calcification. 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 It was thought that this structure could be a potential source of embolism and Plavix was started. 3 week event monitor showed no atrial fibrillation. Future R CEA was recommended, and Dr. Shirlee Latch arranged pre-op stress testing 12/2011 given dyspnea demonstrating small old infarct at inferior base but no ischemia. At her office visit 04/14/12, she reported 2-3 weeks of L chest ache radiating to her L arm with exertion. Her symptoms were felt concerning for [place] and thus left heart cath was arranged. She underwent this yesterday demonstrating 95% mid LAD stenosis. She was reloaded with 300mg  Plavix. Because the vessel was small, she underwent balloon angioplasty only of the LAD lesion. She tolerated the procedure well. She was unable  to tolerate Imdur as an outpatient and thus a selective BB was initiated (given h/o asthma). Lisinopril was increased (I discussed this with Dr. Shirlee Latch - initial note says increase to 20 daily but she was on 10 BID at home-- thus we plan to increase to 20mg  BID). Dr. Shirlee Latch has seen and examined the patient and feels she is stable for discharge. He has spoken with Dr. Myra Gianotti as CEA had originally been planned for this week - per note, that will be pushed back a couple weeks and the patient should his office for new plan.  Discharge Vitals: Blood pressure 127/57, pulse 70, temperature 98.1 F (36.7 C), temperature source Oral, resp. rate 14, height 5\' 5"  (1.651 m), weight 186 lb 15.2 oz (84.8 kg), SpO2 95.00%.  Labs: Lab Results  Component Value Date   WBC 5.3 04/22/2012   HGB 12.3 04/22/2012   HCT 37.0 04/22/2012   MCV 84.3 04/22/2012   PLT 343 04/22/2012     Lab 04/22/12 0602  NA 139  K 4.6  CL 104  CO2 23  BUN 12  CREATININE 0.84  CALCIUM 9.7  PROT --  BILITOT --  ALKPHOS --  ALT --  AST --  GLUCOSE 246*    Lab Results  Component Value Date   CHOL 170 03/05/2012   HDL 50.50 03/05/2012   LDLCALC 100* 03/05/2012   TRIG 97.0 03/05/2012     Diagnostic Studies/Procedures   1. Cardiac catheterization this admission, please see full report and above for summary.  Discharge Medications   Current Discharge Medication List    START taking these medications   Details  aspirin 81  MG EC tablet Take 1 tablet (81 mg total) by mouth daily.    bisoprolol (ZEBETA) 5 MG tablet Take 1 tablet (5 mg total) by mouth daily. Qty: 30 tablet, Refills: 6      CONTINUE these medications which have CHANGED   Details  lisinopril (PRINIVIL,ZESTRIL) 20 MG tablet Take 1 tablet (20 mg total) by mouth 2 (two) times daily. Qty: 60 tablet, Refills: 6      CONTINUE these medications which have NOT CHANGED   Details  atorvastatin (LIPITOR) 40 MG tablet Take 40 mg by mouth daily.      clopidogrel (PLAVIX) 75 MG tablet Take 75 mg by mouth daily.    insulin NPH (HUMULIN N,NOVOLIN N) 100 UNIT/ML injection Inject 10-40 Units into the skin 2 (two) times daily before a meal. SLIDING SCALE   Associated Diagnoses: Exertional angina; Coronary atherosclerosis of native coronary artery; Embolic stroke; Carotid stenosis; Hypertension, accelerated; Hyperlipidemia    nitroGLYCERIN (NITROSTAT) 0.4 MG SL tablet Place 1 tablet (0.4 mg total) under the tongue every 5 (five) minutes as needed for chest pain. Qty: 25 tablet, Refills: 3   Associated Diagnoses: Exertional angina; Coronary atherosclerosis of native coronary artery; Embolic stroke; Carotid stenosis; Hypertension, accelerated; Hyperlipidemia      STOP taking these medications     isosorbide mononitrate (IMDUR) 30 MG 24 hr tablet Comments:  Reason for Stopping: headache         Disposition   The patient will be discharged in stable condition to home. Discharge Orders    Future Appointments: Provider: Department: Dept Phone: Center:   05/06/2012 9:10 AM [facility] [phone] [facility]   05/06/2012 11:50 AM Beatrice Lecher, PA [facility] [phone] [facility]     Future Orders Please Complete By Expires   Diet - low sodium heart healthy      Comments:   Diabetic Diet   Increase activity slowly      Comments:   No driving for 2 days. No lifting over 5 lbs for 1 week. No sexual activity for 1 week. Keep procedure site clean & dry. If you notice increased pain, swelling, bleeding or pus, call/return!  You may shower, but no soaking baths/hot tubs/pools for 1 week.     Follow-up Information    Follow up with Jorge Ny, MD. (Please contact Dr. Estanislado Spire office to discuss new plan for your carotid surgery since it will have to be delayed for now.)    Contact information:   [address] [place] [address] [phone]       Follow up with Tereso Newcomer, PA.  (05/06/12 at 11:50am)    Contact information:   [address] [place] [address] [phone]       Follow up with Eustaquio Boyden, MD. (It is very important to establish care and follow-up for your diabetes. )    Contact information:   [address] [place] [address] [phone]            Duration of Discharge Encounter: Greater than 30 minutes including physician and PA time.  Signed, Ronie Spies PA-C 04/22/2012, 9:47 AM

## 2012-04-22 NOTE — Progress Notes (Signed)
Patient ID: [name], female   DOB: [birth year], 56 y.o.   MRN: [id number]    SUBJECTIVE:  Doing well this morning.  No chest pain.      Marland Kitchen aspirin  81 mg Oral Daily  . atorvastatin  40 mg Oral Daily  . bisoprolol  5 mg Oral Daily  . bivalirudin      . clopidogrel  75 mg Oral Daily  . fentaNYL      . heparin      . insulin aspart  0-9 Units Subcutaneous TID WC  . lidocaine      . lisinopril  20 mg Oral BID  . midazolam      . nitroGLYCERIN      . verapamil      . DISCONTD: isosorbide mononitrate  30 mg Oral Daily  . DISCONTD: lisinopril  10 mg Oral BID      Filed Vitals:   04/21/12 2000 04/21/12 2036 04/21/12 2345 04/22/12 0525  BP: 174/80 127/57    Pulse:  70    Temp:  98.5 F (36.9 C) 98.2 F (36.8 C) 98.1 F (36.7 C)  TempSrc:  Oral  Oral  Resp:  14    Height:    5\' 5"  (1.651 m)  Weight:    186 lb 15.2 oz (84.8 kg)  SpO2:  95% 96% 95%    Intake/Output Summary (Last 24 hours) at 04/22/12 0843 Last data filed at 04/22/12 0500  Gross per 24 hour  Intake   1020 ml  Output      0 ml  Net   1020 ml    LABS: Basic Metabolic Panel:  Basename 04/22/12 0602 04/21/12 1022  NA 139 --  K 4.6 --  CL 104 --  CO2 23 --  GLUCOSE 246* 122*  BUN 12 --  CREATININE 0.84 --  CALCIUM 9.7 --  MG -- --  PHOS -- --   Liver Function Tests: No results found for this basename: AST:2,ALT:2,ALKPHOS:2,BILITOT:2,PROT:2,ALBUMIN:2 in the last 72 hours No results found for this basename: LIPASE:2,AMYLASE:2 in the last 72 hours CBC:  Basename 04/22/12 0602  WBC 5.3  NEUTROABS --  HGB 12.3  HCT 37.0  MCV 84.3  PLT 343   Cardiac Enzymes: No results found for this basename: CKTOTAL:3,CKMB:3,CKMBINDEX:3,TROPONINI:3 in the last 72 hours BNP: No components found with this basename: POCBNP:3 D-Dimer: No results found for this basename: DDIMER:2 in the last 72 hours Hemoglobin A1C: No results found for this basename: HGBA1C in the last 72 hours Fasting Lipid Panel: No  results found for this basename: CHOL,HDL,LDLCALC,TRIG,CHOLHDL,LDLDIRECT in the last 72 hours Thyroid Function Tests: No results found for this basename: TSH,T4TOTAL,FREET3,T3FREE,THYROIDAB in the last 72 hours Anemia Panel: No results found for this basename: VITAMINB12,FOLATE,FERRITIN,TIBC,IRON,RETICCTPCT in the last 72 hours  RADIOLOGY: No results found.  PHYSICAL EXAM General: NAD Neck: No JVD, no thyromegaly or thyroid nodule.  Lungs: Clear to auscultation bilaterally with normal respiratory effort. CV: Nondisplaced PMI.  Heart regular S1/S2, no S3/S4, no murmur.  No peripheral edema.   Abdomen: Soft, nontender, no hepatosplenomegaly, no distention.  Neurologic: Alert and oriented x 3.  Psych: Normal affect. Extremities: No clubbing or cyanosis.  Right radial cath site benign.   TELEMETRY: Reviewed telemetry pt in NSR  ASSESSMENT AND PLAN: 56 yo with history of CVA and carotid stenosis as well as prior CAD developed progressive angina over the last couple of weeks.  LHC yesterday showed 95% mid LAD stenosis.  She had balloon angioplasty to this  area.  The LAD at that point was too small to stent.   - Continue ASA, Plavix, statin.  She cannot take Imdur due to headache.  I will put her on a beta blocker instead (use bisoprolol with h/o asthma).  - With HTN, increase lisinopril to 20 mg daily.  - Discharge today to followup in office with me or [name] in 2 wks.  She will go home on ASA 81, Plavix 75, atorvastatin 40, lisinopril 20 daily, bisoprolol 5 daily, and her home diabetes regimen.  I also spoke with Dr. Myra Gianotti yesterday and CEA scheduled for Thursday will be pushed back a couple of weeks.  She should contact his office for new plan.   [name] 04/22/2012 8:47 AM

## 2012-04-22 NOTE — Care Management Note (Signed)
    Page 1 of 1   04/22/2012     11:22:00 AM   CARE MANAGEMENT NOTE 04/22/2012  Patient:  [name], [name]   Account Number:  [id number]  Date Initiated:  04/22/2012  Documentation initiated by:  CRAFT,TERRI  Subjective/Objective Assessment:   56 yo female admitted 04/21/12 with CAD     Action/Plan:   D/C when medically stable.   Anticipated DC Date:  04/22/2012   Anticipated DC Plan:  HOME/SELF CARE      DC Planning Services  Medication Assistance  CM consult             Status of service:  Completed, signed off  Discharge Disposition:  HOME/SELF CARE  Per UR Regulation:  Reviewed for med. necessity/level of care/duration of stay  Comments:  04/22/12, Kathi Der RNC-MNN, BSN, [phone], CM received referral after pt discharged for medication assistance.  CM received no call from RN concerning pt's discharge.  Pt discharged before CM could see.  Pt not discharged on Brilinta or Effient.  Per pt's nurse, no needs.

## 2012-04-22 NOTE — Progress Notes (Signed)
CARDIAC REHAB PHASE I   PRE:  Rate/Rhythm: 80 SR    BP: sitting 163/73    SaO2: 100 RA  MODE:  Ambulation: 600 ft   POST:  Rate/Rhythm: 117 ST    BP: sitting 187/85, 30 min later 187/84     SaO2:   Tolerated well, some SOB when HR up to 117st. BP elevated and stayed elevated. 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 Sts she tries not to stress. Ed completed. Has her own way of managing DM with insulin when needed. Sts she checks her CBG daily. Encouraged that DM control is very important. Eats vegan diet. Also highly encouraged ex. Interested in [employer] and will send referral. Gave financial aid application for [employer].  [phone] Harriet Masson CES, ACSM

## 2012-04-24 ENCOUNTER — Encounter (HOSPITAL_COMMUNITY): Admission: RE | Payer: Self-pay | Source: Ambulatory Visit

## 2012-04-24 ENCOUNTER — Ambulatory Visit (HOSPITAL_COMMUNITY): Admission: RE | Admit: 2012-04-24 | Payer: Self-pay | Source: Ambulatory Visit | Admitting: Surgery

## 2012-04-24 SURGERY — ENDARTERECTOMY, CAROTID
Anesthesia: General | Site: Neck | Laterality: Right

## 2012-04-30 ENCOUNTER — Encounter (HOSPITAL_COMMUNITY): Payer: Self-pay | Admitting: Pharmacy Technician

## 2012-04-30 ENCOUNTER — Other Ambulatory Visit: Payer: Self-pay

## 2012-05-06 ENCOUNTER — Other Ambulatory Visit (INDEPENDENT_AMBULATORY_CARE_PROVIDER_SITE_OTHER): Payer: Self-pay

## 2012-05-06 ENCOUNTER — Encounter: Payer: Self-pay | Admitting: Physician Assistant

## 2012-05-06 DIAGNOSIS — E785 Hyperlipidemia, unspecified: Secondary | ICD-10-CM

## 2012-05-06 DIAGNOSIS — I2510 Atherosclerotic heart disease of native coronary artery without angina pectoris: Secondary | ICD-10-CM

## 2012-05-06 LAB — HEPATIC FUNCTION PANEL
ALT: 19 U/L (ref 0–35)
AST: 19 U/L (ref 0–37)
Albumin: 4 g/dL (ref 3.5–5.2)
Total Protein: 7 g/dL (ref 6.0–8.3)

## 2012-05-06 LAB — LIPID PANEL
Cholesterol: 155 mg/dL (ref 0–200)
HDL: 49.4 mg/dL (ref >39.00)
Triglycerides: 91 mg/dL (ref 0.0–149.0)
VLDL: 18.2 mg/dL (ref 0.0–40.0)

## 2012-05-08 MED ORDER — VANCOMYCIN HCL IN DEXTROSE 1-5 GM/200ML-% IV SOLN
1000.0000 mg | INTRAVENOUS | Status: DC
  Filled 2012-05-08: qty 200

## 2012-05-09 ENCOUNTER — Encounter (HOSPITAL_COMMUNITY): Payer: Self-pay | Admitting: Anesthesiology

## 2012-05-09 ENCOUNTER — Ambulatory Visit (HOSPITAL_COMMUNITY): Payer: Self-pay | Admitting: Anesthesiology

## 2012-05-09 ENCOUNTER — Telehealth: Payer: Self-pay | Admitting: Surgery

## 2012-05-09 ENCOUNTER — Encounter (HOSPITAL_COMMUNITY): Payer: Self-pay | Admitting: *Deleted

## 2012-05-09 ENCOUNTER — Inpatient Hospital Stay (HOSPITAL_COMMUNITY)
Admission: RE | Admit: 2012-05-09 | Discharge: 2012-05-10 | DRG: 038 | Disposition: A | Discharge status: Home / Self Care | Payer: MEDICAID | Source: Ambulatory Visit | Attending: Surgery | Admitting: Surgery

## 2012-05-09 ENCOUNTER — Encounter (HOSPITAL_COMMUNITY)
Admission: RE | Disposition: A | Discharge status: Home / Self Care | Payer: Self-pay | Source: Ambulatory Visit | Attending: Surgery

## 2012-05-09 DIAGNOSIS — M549 Dorsalgia, unspecified: Secondary | ICD-10-CM | POA: Diagnosis present

## 2012-05-09 DIAGNOSIS — Z9861 Coronary angioplasty status: Secondary | ICD-10-CM

## 2012-05-09 DIAGNOSIS — I639 Cerebral infarction, unspecified: Secondary | ICD-10-CM

## 2012-05-09 DIAGNOSIS — Z79899 Other long term (current) drug therapy: Secondary | ICD-10-CM

## 2012-05-09 DIAGNOSIS — I10 Essential (primary) hypertension: Secondary | ICD-10-CM | POA: Diagnosis present

## 2012-05-09 DIAGNOSIS — G8929 Other chronic pain: Secondary | ICD-10-CM | POA: Diagnosis present

## 2012-05-09 DIAGNOSIS — F3289 Other specified depressive episodes: Secondary | ICD-10-CM | POA: Diagnosis present

## 2012-05-09 DIAGNOSIS — Z7902 Long term (current) use of antithrombotics/antiplatelets: Secondary | ICD-10-CM

## 2012-05-09 DIAGNOSIS — Z87891 Personal history of nicotine dependence: Secondary | ICD-10-CM

## 2012-05-09 DIAGNOSIS — E119 Type 2 diabetes mellitus without complications: Secondary | ICD-10-CM | POA: Diagnosis present

## 2012-05-09 DIAGNOSIS — I6529 Occlusion and stenosis of unspecified carotid artery: Principal | ICD-10-CM | POA: Diagnosis present

## 2012-05-09 DIAGNOSIS — F411 Generalized anxiety disorder: Secondary | ICD-10-CM | POA: Diagnosis present

## 2012-05-09 DIAGNOSIS — I2510 Atherosclerotic heart disease of native coronary artery without angina pectoris: Secondary | ICD-10-CM | POA: Diagnosis present

## 2012-05-09 DIAGNOSIS — Z8673 Personal history of transient ischemic attack (TIA), and cerebral infarction without residual deficits: Secondary | ICD-10-CM

## 2012-05-09 DIAGNOSIS — G609 Hereditary and idiopathic neuropathy, unspecified: Secondary | ICD-10-CM | POA: Diagnosis present

## 2012-05-09 DIAGNOSIS — J45909 Unspecified asthma, uncomplicated: Secondary | ICD-10-CM | POA: Diagnosis present

## 2012-05-09 DIAGNOSIS — I208 Other forms of angina pectoris: Secondary | ICD-10-CM

## 2012-05-09 DIAGNOSIS — F329 Major depressive disorder, single episode, unspecified: Secondary | ICD-10-CM | POA: Diagnosis present

## 2012-05-09 DIAGNOSIS — M19049 Primary osteoarthritis, unspecified hand: Secondary | ICD-10-CM | POA: Diagnosis present

## 2012-05-09 DIAGNOSIS — Z794 Long term (current) use of insulin: Secondary | ICD-10-CM

## 2012-05-09 DIAGNOSIS — E785 Hyperlipidemia, unspecified: Secondary | ICD-10-CM | POA: Diagnosis present

## 2012-05-09 HISTORY — PX: ENDARTERECTOMY: SHX5162

## 2012-05-09 LAB — GLUCOSE, CAPILLARY
Glucose-Capillary: 157 mg/dL — ABNORMAL HIGH (ref 70–99)
Glucose-Capillary: 180 mg/dL — ABNORMAL HIGH (ref 70–99)

## 2012-05-09 LAB — CBC
HCT: 34.3 % — ABNORMAL LOW (ref 36.0–46.0)
MCV: 83.5 fL (ref 78.0–100.0)
Platelets: 334 10*3/uL (ref 150–400)
RBC: 4.11 MIL/uL (ref 3.87–5.11)
WBC: 6.6 10*3/uL (ref 4.0–10.5)

## 2012-05-09 LAB — NO BLOOD PRODUCTS

## 2012-05-09 LAB — BASIC METABOLIC PANEL
CO2: 27 mEq/L (ref 19–32)
Calcium: 10.1 mg/dL (ref 8.4–10.5)
GFR calc Af Amer: >90 mL/min (ref >90)
Sodium: 138 mEq/L (ref 135–145)

## 2012-05-09 LAB — PROTIME-INR: INR: 0.95 (ref 0.00–1.49)

## 2012-05-09 LAB — APTT: aPTT: 30 seconds (ref 24–37)

## 2012-05-09 SURGERY — ENDARTERECTOMY, CAROTID
Anesthesia: General | Site: Neck | Laterality: Right | Wound class: Clean

## 2012-05-09 MED ORDER — BISOPROLOL FUMARATE 5 MG PO TABS
5.0000 mg | ORAL_TABLET | Freq: Every day | ORAL | Status: DC
  Administered 2012-05-09: 5 mg via ORAL
  Filled 2012-05-09 (×2): qty 1

## 2012-05-09 MED ORDER — HYDROMORPHONE HCL PF 1 MG/ML IJ SOLN
INTRAMUSCULAR | Status: AC
  Filled 2012-05-09: qty 1

## 2012-05-09 MED ORDER — SODIUM CHLORIDE 0.9 % IV SOLN
INTRAVENOUS | Status: DC
  Administered 2012-05-09: 15:00:00 via INTRAVENOUS

## 2012-05-09 MED ORDER — PANTOPRAZOLE SODIUM 40 MG PO TBEC
40.0000 mg | DELAYED_RELEASE_TABLET | Freq: Every day | ORAL | Status: DC
  Filled 2012-05-09: qty 1

## 2012-05-09 MED ORDER — ATORVASTATIN CALCIUM 40 MG PO TABS
40.0000 mg | ORAL_TABLET | Freq: Every day | ORAL | Status: DC
  Administered 2012-05-09: 40 mg via ORAL
  Filled 2012-05-09 (×2): qty 1

## 2012-05-09 MED ORDER — CLOPIDOGREL BISULFATE 75 MG PO TABS
75.0000 mg | ORAL_TABLET | Freq: Every day | ORAL | Status: DC
  Filled 2012-05-09: qty 1

## 2012-05-09 MED ORDER — 0.9 % SODIUM CHLORIDE (POUR BTL) OPTIME
TOPICAL | Status: DC | PRN
  Administered 2012-05-09: 2000 mL

## 2012-05-09 MED ORDER — LABETALOL HCL 5 MG/ML IV SOLN: 10.0000 mg | INTRAVENOUS | Status: DC | PRN

## 2012-05-09 MED ORDER — LABETALOL HCL 5 MG/ML IV SOLN
INTRAVENOUS | Status: DC | PRN
  Administered 2012-05-09: 5 mg via INTRAVENOUS

## 2012-05-09 MED ORDER — NITROGLYCERIN 0.4 MG SL SUBL: 0.4000 mg | SUBLINGUAL_TABLET | SUBLINGUAL | Status: DC | PRN

## 2012-05-09 MED ORDER — SODIUM CHLORIDE 0.9 % IV SOLN: INTRAVENOUS | Status: DC

## 2012-05-09 MED ORDER — INSULIN NPH (HUMAN) (ISOPHANE) 100 UNIT/ML ~~LOC~~ SUSP: 10.0000 [IU] | Freq: Every day | SUBCUTANEOUS | Status: DC

## 2012-05-09 MED ORDER — HEMOSTATIC AGENTS (NO CHARGE) OPTIME
TOPICAL | Status: DC | PRN
  Administered 2012-05-09: 1 via TOPICAL

## 2012-05-09 MED ORDER — INSULIN NPH (HUMAN) (ISOPHANE) 100 UNIT/ML ~~LOC~~ SUSP
10.0000 [IU] | Freq: Every day | SUBCUTANEOUS | Status: DC
  Administered 2012-05-10: 25 [IU] via SUBCUTANEOUS
  Filled 2012-05-09: qty 10

## 2012-05-09 MED ORDER — HYDROMORPHONE HCL PF 1 MG/ML IJ SOLN
0.2500 mg | INTRAMUSCULAR | Status: DC | PRN
  Administered 2012-05-09 (×4): 0.5 mg via INTRAVENOUS

## 2012-05-09 MED ORDER — TEMAZEPAM 15 MG PO CAPS: 15.0000 mg | ORAL_CAPSULE | Freq: Every evening | ORAL | Status: DC | PRN

## 2012-05-09 MED ORDER — ARTIFICIAL TEARS OP OINT
TOPICAL_OINTMENT | OPHTHALMIC | Status: DC | PRN
  Administered 2012-05-09: 1 via OPHTHALMIC

## 2012-05-09 MED ORDER — LIDOCAINE HCL 4 % MT SOLN
OROMUCOSAL | Status: DC | PRN
  Administered 2012-05-09: 4 mL via TOPICAL

## 2012-05-09 MED ORDER — MUPIROCIN 2 % EX OINT
TOPICAL_OINTMENT | CUTANEOUS | Status: AC
  Filled 2012-05-09: qty 22

## 2012-05-09 MED ORDER — LIDOCAINE HCL (CARDIAC) 20 MG/ML IV SOLN
INTRAVENOUS | Status: DC | PRN
  Administered 2012-05-09: 60 mg via INTRAVENOUS

## 2012-05-09 MED ORDER — METOPROLOL TARTRATE 1 MG/ML IV SOLN: 2.0000 mg | INTRAVENOUS | Status: DC | PRN

## 2012-05-09 MED ORDER — DOPAMINE-DEXTROSE 3.2-5 MG/ML-% IV SOLN: 3.0000 ug/kg/min | INTRAVENOUS | Status: DC

## 2012-05-09 MED ORDER — INSULIN ASPART 100 UNIT/ML ~~LOC~~ SOLN
0.0000 [IU] | Freq: Three times a day (TID) | SUBCUTANEOUS | Status: DC
  Administered 2012-05-09 – 2012-05-10 (×2): 3 [IU] via SUBCUTANEOUS

## 2012-05-09 MED ORDER — ALUM & MAG HYDROXIDE-SIMETH 200-200-20 MG/5ML PO SUSP: 15.0000 mL | ORAL | Status: DC | PRN

## 2012-05-09 MED ORDER — VANCOMYCIN HCL IN DEXTROSE 1-5 GM/200ML-% IV SOLN
1000.0000 mg | Freq: Two times a day (BID) | INTRAVENOUS | Status: AC
  Administered 2012-05-09 – 2012-05-10 (×2): 1000 mg via INTRAVENOUS
  Filled 2012-05-09 (×2): qty 200

## 2012-05-09 MED ORDER — PHENOL 1.4 % MT LIQD: 1.0000 | OROMUCOSAL | Status: DC | PRN

## 2012-05-09 MED ORDER — OXYCODONE HCL 5 MG PO TABS: 5.0000 mg | ORAL_TABLET | Freq: Four times a day (QID) | ORAL | Status: DC | PRN

## 2012-05-09 MED ORDER — MUPIROCIN 2 % EX OINT: TOPICAL_OINTMENT | Freq: Two times a day (BID) | CUTANEOUS | Status: DC

## 2012-05-09 MED ORDER — HYDROMORPHONE HCL PF 1 MG/ML IJ SOLN: 0.2500 mg | INTRAMUSCULAR | Status: DC | PRN

## 2012-05-09 MED ORDER — SODIUM CHLORIDE 0.9 % IR SOLN
Status: DC | PRN
  Administered 2012-05-09: 09:00:00

## 2012-05-09 MED ORDER — LISINOPRIL 20 MG PO TABS
20.0000 mg | ORAL_TABLET | Freq: Two times a day (BID) | ORAL | Status: DC
  Administered 2012-05-09: 20 mg via ORAL
  Filled 2012-05-09 (×4): qty 1

## 2012-05-09 MED ORDER — ASPIRIN EC 81 MG PO TBEC
81.0000 mg | DELAYED_RELEASE_TABLET | Freq: Every day | ORAL | Status: DC
  Administered 2012-05-09: 81 mg via ORAL
  Filled 2012-05-09 (×2): qty 1

## 2012-05-09 MED ORDER — GLYCOPYRROLATE 0.2 MG/ML IJ SOLN
INTRAMUSCULAR | Status: DC | PRN
  Administered 2012-05-09: 0.4 mg via INTRAVENOUS

## 2012-05-09 MED ORDER — ONDANSETRON HCL 4 MG/2ML IJ SOLN
INTRAMUSCULAR | Status: AC
  Filled 2012-05-09: qty 2

## 2012-05-09 MED ORDER — ACETAMINOPHEN 325 MG PO TABS: 325.0000 mg | ORAL_TABLET | ORAL | Status: DC | PRN

## 2012-05-09 MED ORDER — LACTATED RINGERS IV SOLN
INTRAVENOUS | Status: DC | PRN
  Administered 2012-05-09 (×2): via INTRAVENOUS

## 2012-05-09 MED ORDER — HYDRALAZINE HCL 20 MG/ML IJ SOLN: 10.0000 mg | INTRAMUSCULAR | Status: DC | PRN

## 2012-05-09 MED ORDER — ACETAMINOPHEN 650 MG RE SUPP: 325.0000 mg | RECTAL | Status: DC | PRN

## 2012-05-09 MED ORDER — HEPARIN SODIUM (PORCINE) 1000 UNIT/ML IJ SOLN
INTRAMUSCULAR | Status: DC | PRN
  Administered 2012-05-09: 8000 [IU] via INTRAVENOUS

## 2012-05-09 MED ORDER — ONDANSETRON HCL 4 MG/2ML IJ SOLN
4.0000 mg | Freq: Four times a day (QID) | INTRAMUSCULAR | Status: DC | PRN
  Administered 2012-05-09: 4 mg via INTRAVENOUS
  Filled 2012-05-09: qty 2

## 2012-05-09 MED ORDER — PHENYLEPHRINE HCL 10 MG/ML IJ SOLN
10.0000 mg | INTRAVENOUS | Status: DC | PRN
  Administered 2012-05-09: 15 ug/min via INTRAVENOUS

## 2012-05-09 MED ORDER — PROTAMINE SULFATE 10 MG/ML IV SOLN
INTRAVENOUS | Status: DC | PRN
  Administered 2012-05-09: 50 mg via INTRAVENOUS

## 2012-05-09 MED ORDER — DOCUSATE SODIUM 100 MG PO CAPS
100.0000 mg | ORAL_CAPSULE | Freq: Every day | ORAL | Status: DC
  Filled 2012-05-09: qty 1

## 2012-05-09 MED ORDER — ONDANSETRON HCL 4 MG/2ML IJ SOLN
INTRAMUSCULAR | Status: DC | PRN
  Administered 2012-05-09: 4 mg via INTRAVENOUS

## 2012-05-09 MED ORDER — OXYCODONE HCL 5 MG PO TABS
ORAL_TABLET | ORAL | Status: AC
  Filled 2012-05-09: qty 2

## 2012-05-09 MED ORDER — OXYCODONE HCL 5 MG PO TABS
5.0000 mg | ORAL_TABLET | ORAL | Status: DC | PRN
  Administered 2012-05-09: 10 mg via ORAL

## 2012-05-09 MED ORDER — POTASSIUM CHLORIDE CRYS ER 20 MEQ PO TBCR: 20.0000 meq | EXTENDED_RELEASE_TABLET | Freq: Once | ORAL | Status: AC | PRN

## 2012-05-09 MED ORDER — FENTANYL CITRATE 0.05 MG/ML IJ SOLN
INTRAMUSCULAR | Status: DC | PRN
  Administered 2012-05-09: 150 ug via INTRAVENOUS
  Administered 2012-05-09: 50 ug via INTRAVENOUS

## 2012-05-09 MED ORDER — NEOSTIGMINE METHYLSULFATE 1 MG/ML IJ SOLN
INTRAMUSCULAR | Status: DC | PRN
  Administered 2012-05-09: 3 mg via INTRAVENOUS

## 2012-05-09 MED ORDER — GUAIFENESIN-DM 100-10 MG/5ML PO SYRP: 15.0000 mL | ORAL_SOLUTION | ORAL | Status: DC | PRN

## 2012-05-09 MED ORDER — SODIUM CHLORIDE 0.9 % IV SOLN: 500.0000 mL | Freq: Once | INTRAVENOUS | Status: AC | PRN

## 2012-05-09 MED ORDER — MORPHINE SULFATE 2 MG/ML IJ SOLN
2.0000 mg | INTRAMUSCULAR | Status: DC | PRN
  Administered 2012-05-09: 4 mg via INTRAVENOUS
  Administered 2012-05-09 – 2012-05-10 (×2): 2 mg via INTRAVENOUS
  Filled 2012-05-09: qty 2
  Filled 2012-05-09 (×2): qty 1

## 2012-05-09 MED ORDER — ROCURONIUM BROMIDE 100 MG/10ML IV SOLN
INTRAVENOUS | Status: DC | PRN
  Administered 2012-05-09: 50 mg via INTRAVENOUS

## 2012-05-09 MED ORDER — PROPOFOL 10 MG/ML IV BOLUS
INTRAVENOUS | Status: DC | PRN
  Administered 2012-05-09: 150 mg via INTRAVENOUS

## 2012-05-09 MED ORDER — LIDOCAINE HCL (PF) 1 % IJ SOLN
INTRAMUSCULAR | Status: AC
  Filled 2012-05-09: qty 30

## 2012-05-09 MED ORDER — VANCOMYCIN HCL IN DEXTROSE 1-5 GM/200ML-% IV SOLN
1000.0000 mg | INTRAVENOUS | Status: AC
  Administered 2012-05-09: 1000 mg via INTRAVENOUS

## 2012-05-09 SURGICAL SUPPLY — 47 items
CANISTER SUCTION 2500CC (MISCELLANEOUS) ×2 IMPLANT
CATH ROBINSON RED A/P 18FR (CATHETERS) ×2 IMPLANT
CATH SUCT 10FR WHISTLE TIP (CATHETERS) ×2 IMPLANT
CLIP TI MEDIUM 24 (CLIP) ×2 IMPLANT
CLIP TI WIDE RED SMALL 24 (CLIP) ×2 IMPLANT
CLOTH BEACON ORANGE TIMEOUT ST (SAFETY) ×2 IMPLANT
COVER SURGICAL LIGHT HANDLE (MISCELLANEOUS) ×2 IMPLANT
CRADLE DONUT ADULT HEAD (MISCELLANEOUS) ×2 IMPLANT
DERMABOND ADVANCED (GAUZE/BANDAGES/DRESSINGS) ×1
DERMABOND ADVANCED .7 DNX12 (GAUZE/BANDAGES/DRESSINGS) ×1 IMPLANT
DRAIN CHANNEL 15F RND FF W/TCR (WOUND CARE) IMPLANT
DRAPE WARM FLUID 44X44 (DRAPE) ×2 IMPLANT
ELECT REM PT RETURN 9FT ADLT (ELECTROSURGICAL) ×2
ELECTRODE REM PT RTRN 9FT ADLT (ELECTROSURGICAL) ×1 IMPLANT
EVACUATOR SILICONE 100CC (DRAIN) IMPLANT
GLOVE BIO SURGEON STRL SZ 6.5 (GLOVE) ×4 IMPLANT
GLOVE BIOGEL PI IND STRL 6.5 (GLOVE) ×1 IMPLANT
GLOVE BIOGEL PI IND STRL 7.5 (GLOVE) ×3 IMPLANT
GLOVE BIOGEL PI INDICATOR 6.5 (GLOVE) ×1
GLOVE BIOGEL PI INDICATOR 7.5 (GLOVE) ×3
GLOVE SURG SS PI 7.5 STRL IVOR (GLOVE) ×8 IMPLANT
GOWN PREVENTION PLUS XLARGE (GOWN DISPOSABLE) ×2 IMPLANT
GOWN PREVENTION PLUS XXLARGE (GOWN DISPOSABLE) ×2 IMPLANT
GOWN STRL NON-REIN LRG LVL3 (GOWN DISPOSABLE) ×6 IMPLANT
HEMOSTAT SNOW SURGICEL 2X4 (HEMOSTASIS) ×2 IMPLANT
HEMOSTAT SURGICEL 2X14 (HEMOSTASIS) IMPLANT
INSERT FOGARTY SM (MISCELLANEOUS) IMPLANT
KIT BASIN OR (CUSTOM PROCEDURE TRAY) ×2 IMPLANT
KIT ROOM TURNOVER OR (KITS) ×2 IMPLANT
NS IRRIG 1000ML POUR BTL (IV SOLUTION) ×4 IMPLANT
PACK CAROTID (CUSTOM PROCEDURE TRAY) ×2 IMPLANT
PAD ARMBOARD 7.5X6 YLW CONV (MISCELLANEOUS) ×4 IMPLANT
PATCH VASCULAR VASCU GUARD 1X6 (Vascular Products) ×2 IMPLANT
SHUNT CAROTID BYPASS 10 (VASCULAR PRODUCTS) IMPLANT
SHUNT CAROTID BYPASS 12FRX15.5 (VASCULAR PRODUCTS) IMPLANT
SPECIMEN JAR SMALL (MISCELLANEOUS) ×2 IMPLANT
SPONGE INTESTINAL PEANUT (DISPOSABLE) ×2 IMPLANT
SUT ETHILON 3 0 PS 1 (SUTURE) IMPLANT
SUT PROLENE 6 0 BV (SUTURE) ×2 IMPLANT
SUT PROLENE 7 0 BV 1 (SUTURE) IMPLANT
SUT PROLENE 7 0 BV1 MDA (SUTURE) ×2 IMPLANT
SUT VIC AB 3-0 SH 27 (SUTURE) ×2
SUT VIC AB 3-0 SH 27X BRD (SUTURE) ×2 IMPLANT
SUT VICRYL 4-0 PS2 18IN ABS (SUTURE) ×2 IMPLANT
TOWEL OR 17X24 6PK STRL BLUE (TOWEL DISPOSABLE) ×2 IMPLANT
TOWEL OR 17X26 10 PK STRL BLUE (TOWEL DISPOSABLE) ×2 IMPLANT
WATER STERILE IRR 1000ML POUR (IV SOLUTION) ×2 IMPLANT

## 2012-05-09 NOTE — Preoperative (Signed)
Beta Blockers   Reason not to administer Beta Blockers:Not Applicable 

## 2012-05-09 NOTE — Anesthesia Postprocedure Evaluation (Signed)
  Anesthesia Post-op Note  Patient: [name]  Procedure(s) Performed: Procedure(s) (LRB) with comments: ENDARTERECTOMY CAROTID (Right) - Right carotd endarterectomy with bovine patch angioplasty  Patient Location: PACU  Anesthesia Type:General  Level of Consciousness: awake  Airway and Oxygen Therapy: Patient Spontanous Breathing  Post-op Pain: mild  Post-op Assessment: Post-op Vital signs reviewed  Post-op Vital Signs: Reviewed  Complications: No apparent anesthesia complications

## 2012-05-09 NOTE — Anesthesia Preprocedure Evaluation (Addendum)
Anesthesia Evaluation  Patient identified by MRN, date of birth, ID band Patient awake    Reviewed: Allergy & Precautions, H&P , NPO status   Airway Mallampati: II      Dental   Pulmonary asthma ,  breath sounds clear to auscultation        Cardiovascular hypertension, Pt. on medications + angina + CAD, + Past MI and + Cardiac Stents Rhythm:Regular Rate:Normal     Neuro/Psych  Headaches, PSYCHIATRIC DISORDERS Anxiety Depression CVA    GI/Hepatic negative GI ROS, Neg liver ROS,   Endo/Other  diabetes, Type 2, Insulin Dependent  Renal/GU negative Renal ROS     Musculoskeletal   Abdominal   Peds  Hematology   Anesthesia Other Findings   Reproductive/Obstetrics                          Anesthesia Physical Anesthesia Plan  ASA: III  Anesthesia Plan: General   Post-op Pain Management:    Induction: Intravenous  Airway Management Planned: Oral ETT  Additional Equipment: Arterial line  Intra-op Plan:   Post-operative Plan: Extubation in OR  Informed Consent: I have reviewed the patients History and Physical, chart, labs and discussed the procedure including the risks, benefits and alternatives for the proposed anesthesia with the patient or authorized representative who has indicated his/her understanding and acceptance.   Dental advisory given  Plan Discussed with: CRNA, Anesthesiologist and Surgeon  Anesthesia Plan Comments:         Anesthesia Quick Evaluation

## 2012-05-09 NOTE — H&P (Signed)
[facility] [place]   Patient name: [name] MRN: [id number] DOB: [birth year] Sex: female    No chief complaint on file.   HISTORY OF PRESENT ILLNESS:  [name] is an 56 y.o. female history of diabetes mellitus, coronary artery disease, hypertension and hyperlipidemia, presenting 12/12/2011 with a history of confusion with fairly striking short-term memory difficulty. Patient asked same questions over and over despite being given the correct answers by family members. Symptoms lasted at least several hours. There was no focal weakness. Speech was not slurred. There is no previous history of similar symptoms. Patient has no memory for any events this morning and early afternoon today. She has not been on antiplatelet therapy. CT scan showed no acute intracranial abnormality. MRI showed multiple small areas of restricted diffusion suggestive of acute infarctions of possible proximal embolic source since multiple vascular territories were involved. MRA of the head showed no significant stenotic or occlusive lesion. MRA of the neck showed high-grade right internal carotid artery stenosis as well as moderate stenosis at the origin of the right external carotid artery. Patient was not a TPA candidate secondary to presenting outside the TPA window. She was admitted for further evaluation and treatment. She has a history of coronary disease with stent placement. She is medically managed for her hypertension. She is a former smoker.  Echo revealed a calcified area on her bvalve that was thought tot be the source of her stroke.  I did a carotid angiogram which revealed 85% right carotid stenosis.  She is asymptomatic from her right carotid stenosis.   Past Medical History  Diagnosis Date  . CAD (coronary artery disease)     a. PCI [place] 2004. b. Angina - balloon angioplasty to LAD 04/21/12 (vessel too small for stent).  . Refusal of blood transfusions as patient is Jehovah's  Witness   . HLD (hyperlipidemia)     takes Lipitor daily  . Asthma   . Migraine aura without headache     visual changes  . Chronic lower back pain   . Anxiety     with claustrophobia  . Carotid stenosis 11/2011    critical R, ~30% L by angiogram, pending CEA  . Hypertension, accelerated     takes Lisinopril daily  . Peripheral neuropathy   . 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 MRI: mult small b/l infarcts c/w emboli. MRA neck: high grade RICA stenosis. TEE: EF 55-60% with mild LVH, severe MAC, < 1 cm calc mobile structure on the MV annulus, ? 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 Thought was RICA stenosis did not explain the b/l small strokes; concern for emboli from the mobile calc mitral ann struct - Event  no AFib  . History of kidney stones   . Diabetes mellitus type 2, uncontrolled   . Cataract     left  . Depression     st johns' wart occasionally  . Complication of anesthesia     "I don't want to wake up afterwards" (04/21/2012)  . Arthritis     "right hand; maybe just starting in my left hand" (04/21/2012)    Past Surgical History  Procedure Date  . Kidney stone surgery     several times  . Carpal tunnel release ~ 2005    bilateral  . Cataract extraction w/ intraocular lens implant 2012    right  . Tee without cardioversion 12/14/2011    Procedure: TRANSESOPHAGEAL ECHOCARDIOGRAM (TEE);  Surgeon: Laurey Morale, MD;  Location: [facility] ENDOSCOPY;  Service: Cardiovascular;  Laterality: N/A;  . Ankle surgery 2005    after fall down stairs, left with screws and plate in place  . Holter monitor 2013    WNL  . US echocardiography 2002    normal LV fxn, mild LVH, borderline L atrial size  . Lithotripsy   . Coronary angioplasty with stent placement 2002    x2  . Cardiac catheterization 2004    WNL, no significant coronary disease  . Cardiac catheterization 12/19/11  . Coronary angioplasty 04/21/2012    95% stenosis mid LAD, vessel  not large enough for stent, rec plavix/ASA for 1 mo    History   Social History  . Marital Status: Married    Spouse Name: N/A    Number of Children: N/A  . Years of Education: N/A   Occupational History  . Not on file.   Social History Main Topics  . Smoking status: Former Smoker -- 1.0 packs/day for 3 years    Types: Cigarettes    Quit date: 06/26/1975  . Smokeless tobacco: Never Used  . Alcohol Use: No     Comment: none since 11/2011  . Drug Use: No  . Sexually Active: Yes    Birth Control/ Protection: Post-menopausal   Other Topics Concern  . Not on file   Social History Narrative   Caffeine: 2 cups/day coffeeLives alone, husband in [place].  4 children nearby, son to live with her.Occupation: unemployed, prior worked as Charity fundraiser: stays active on farmDiet: vegan, no processed foods, good water daily, fruits/vegetables dailyjehova's witness - no blood products.    Family History  Problem Relation Age of Onset  . Heart disease Mother     valve problems  . Heart disease Sister     valve problems  . Heart disease Brother   . Stroke Mother   . Stroke Brother 65  . Diabetes Father   . Cancer Neg Hx   . Hypertension Father   . Hypertension Mother     Allergies as of 04/30/2012 - Review Complete 04/30/2012  Allergen Reaction Noted  . Isosorbide Other (See Comments) 04/21/2012  . Metformin and related Diarrhea and Nausea And Vomiting 01/09/2012  . Penicillins Itching and Other (See Comments) 12/12/2011  . Toradol (ketorolac tromethamine) Other (See Comments) 04/14/2012    No current facility-administered medications on file prior to encounter.   Current Outpatient Prescriptions on File Prior to Encounter  Medication Sig Dispense Refill  . atorvastatin (LIPITOR) 40 MG tablet Take 40 mg by mouth daily.      . bisoprolol (ZEBETA) 5 MG tablet Take 5 mg by mouth daily.      . clopidogrel (PLAVIX) 75 MG tablet Take 75 mg by mouth daily.      . insulin NPH  (HUMULIN N,NOVOLIN N) 100 UNIT/ML injection Inject 10-40 Units into the skin 2 (two) times daily before a meal. SLIDING SCALE         REVIEW OF SYSTEMS: Cardiovascular: No chest pain, chest pressure, palpitations, orthopnea, or dyspnea on exertion. No claudication or rest pain,  No history of DVT or phlebitis. Pulmonary: No productive cough, asthma or wheezing. Neurologic: No weakness, paresthesias, aphasia, or amaurosis. No dizziness. Hematologic: No bleeding problems or clotting disorders. Musculoskeletal: No joint pain or joint swelling. Gastrointestinal: No blood in stool or hematemesis Genitourinary: No dysuria or hematuria. Psychiatric:: No history of major depression. Integumentary: No rashes or ulcers. Constitutional: No fever or chills.  PHYSICAL EXAMINATION:   Vital signs are BP 174/95  Pulse 61  Temp 98.2 F (36.8 C) (Oral)  Resp 20  Ht 5\' 5"  (1.651 m)  Wt 186 lb (84.369 kg)  BMI 30.95 kg/m2  SpO2 98% General: The patient appears their stated age. HEENT:  No gross abnormalities Pulmonary:  Non labored breathing Abdomen: Soft and non-tender Musculoskeletal: There are no major deformities. Neurologic: No focal weakness or paresthesias are detected, Skin: There are no ulcer or rashes noted. Psychiatric: The patient has normal affect. Cardiovascular: There is a regular rate and rhythm without significant murmur appreciated.   Assessment: Asymptomatic right carotid stenosis Plan: For carotid endarterectomy, right today.  No new symptoms since I last saw her  V. Charlena Cross, M.D. [facility] [place] Office: [phone] Pager:  [phone]

## 2012-05-09 NOTE — Progress Notes (Signed)
Utilization review completed.  

## 2012-05-09 NOTE — OR Nursing (Signed)
Patient was neuro intact at preoperative assessment 0740.

## 2012-05-09 NOTE — Progress Notes (Signed)
Pt. Refuses blood and blood products,Jehovah Witness.

## 2012-05-09 NOTE — Telephone Encounter (Addendum)
Message copied by Shari Prows on Fri May 09, 2012 12:41 PM ------      Message from: [place], [place] K      Created: Fri May 09, 2012 12:18 PM      Regarding: schedule                   ----- Message -----         From: Dara Lords, PA         Sent: 05/09/2012  10:08 AM           To: Sharee Pimple, CMA            S/p right CEA by Dr. Myra Gianotti today.      F/u with him in 2 weeks.            Thanks,      Lelon Mast  I scheduled an appt for the above pt on 05/26/12 at 8:45am. I left a message for the pt at her hm ph# and also mailed an appt letter.awt

## 2012-05-09 NOTE — Transfer of Care (Signed)
Immediate Anesthesia Transfer of Care Note  Patient: [name]  Procedure(s) Performed: Procedure(s) (LRB) with comments: ENDARTERECTOMY CAROTID (Right) - Right carotd endarterectomy with bovine patch angioplasty  Patient Location: PACU  Anesthesia Type:General  Level of Consciousness: awake, alert  and oriented  Airway & Oxygen Therapy: Patient Spontanous Breathing and Patient connected to nasal cannula oxygen  Post-op Assessment: Report given to PACU RN  Post vital signs: Reviewed and stable  Complications: No apparent anesthesia complications

## 2012-05-09 NOTE — Op Note (Signed)
[facility]  Patient name: [name] MRN: [id number] DOB: December 18, 1955 Sex: female  05/09/2012 Pre-operative Diagnosis: asymptomatic   right carotid stenosis Post-operative diagnosis:  Same Surgeon:  Jorge Ny Assistants:  Stann Mainland, P.A. Procedure:    right carotid Endarterectomy with bovine pericardial  patch angioplasty Anesthesia:  General Blood Loss:  See anesthesia record Specimens:  Carotid Plaque to pathology  Findings:  85 %stenosis; Thrombus:  none  Indications:  56 yo with bilateral infarcts thought to be secondary to aortic valve calcification.  During this work-up she was found to have 85% right carotid stenosis by angiography.  She recently had her procedure cancelled due to chest pain.  She underwent angioplasty of her LAD.  She is here today for CEA.  She remains asymptomatic  Procedure:  The patient was identified in the holding area and taken to [facility] OR ROOM 11  The patient was then placed supine on the table.   General endotrachial anesthesia was administered.  The patient was prepped and draped in the usual sterile fashion.  A time out was called and antibiotics were administered.  The incision was made along the anterior border of the right sternocleidomastoid muscle.  Cautery was used to dissect through the subcutaneous tissue.  The platysma muscle was divided with cautery.  The internal jugular vein was exposed along its anterior medial border.  The common facial vein was exposed and then divided between 2-0 silk ties and metal clips.  The common carotid artery was then circumferentially exposed and encircled with an umbilical tape.  The vagus nerve was identified and protected.  Next sharp dissection was used to expose the external carotid artery and the superior thyroid artery.  The were encircled with a blue vessel loop and a 2-0 silk tie respectively.  Finally, the internal carotid was carefully dissected free.  An umbilical  tape was placed around the internal carotid artery distal to the diseased segment.  The hypoglossal nerve was visualized throughout and protected.  The patient was given systemic heparinization.  A bovine carotid patch was selected and prepared on the back table.  A 10 french shunt was also prepared.  After blood pressure readings were appropriate and the heparin had been given time to circulate, the internal carotid artery was occluded with a baby Gregory clamp.  The external and common carotid arteries were then occluded with vascular clamps and the 2-0 tie tightened on the superior thyroid artery.  A #11 blade was used to make an arteriotomy in the common carotid artery.  This was extended with Potts scissors along the anterior and lateral border of the common and internal carotid artery.  Approximately 85% stenosis was identified.  There was no thrombus identified. I did not place a shunt as there was excellent backbleeding.  A kleiner kuntz elevator was used to perform endarterectomy.  An eversion endarterectomy was performed in the external carotid artery.  A good distal endpoint was obtained in the internal carotid artery.  The specimen was removed and sent to pathology.  Heparinized saline was used to irrigate the endarterectomized field.  All potential embolic debris was removed.  Bovine pericardial patch angioplasty was then performed using a running 6-0 Prolene.  The common internal and external carotid arteries were all appropriately flushed. The artery was again irrigated with heparin saline.  The anastomosis was then secured. The clamp was first released on the external carotid artery followed by the common carotid artery approximately 30 seconds  later, bloodflow was reestablish through the internal carotid artery.  Next, a hand-held  Doppler was used to evaluate the signals in the common, external, and internal  carotid arteries, all of which had appropriate signals. I then administered  50 mg  protamine. The wound was then irrigated.  After hemostasis was achieved, the carotid sheath was reapproximated with 3-0 Vicryl. The  platysma muscle was reapproximated with running 3-0 Vicryl. The skin  was closed with 4-0 Vicryl. Dermabond was placed on the skin. The  patient was then successfully extubated. Her neurologic exam was  similar to his preprocedural exam. The patient was then taken to recovery room  in stable condition. There were no complications.     Disposition:  To PACU in stable condition.  Relevant Operative Details:  No shunt was utilized as there was adequate back-bleeding.  85% stensosi was identified.  Hypoglossal nerve was protected throughout.  It was in the normal location  V. Durene Cal, M.D. [facility] [place] Office: [phone] Pager:  [phone]

## 2012-05-10 LAB — BASIC METABOLIC PANEL
GFR calc Af Amer: >90 mL/min (ref >90)
GFR calc non Af Amer: >90 mL/min (ref >90)
Potassium: 3.9 mEq/L (ref 3.5–5.1)
Sodium: 134 mEq/L — ABNORMAL LOW (ref 135–145)

## 2012-05-10 LAB — CBC
HCT: 28.4 % — ABNORMAL LOW (ref 36.0–46.0)
Hemoglobin: 9.7 g/dL — ABNORMAL LOW (ref 12.0–15.0)
MCV: 84.5 fL (ref 78.0–100.0)
RDW: 12.7 % (ref 11.5–15.5)
WBC: 7.9 10*3/uL (ref 4.0–10.5)

## 2012-05-10 LAB — GLUCOSE, CAPILLARY

## 2012-05-10 NOTE — Discharge Summary (Signed)
[facility] Specialists Discharge Summary   Patient ID:  [name] MRN: [id number] DOB/AGE: Sep 16, 1955 56 y.o.  Admit date: 05/09/2012 Discharge date: 05/10/2012 Date of Surgery: 05/09/2012 Surgeon: Surgeon(s): Nada Libman, MD  Admission Diagnosis: Right Internal Carotid Artery Stenosis   Discharge Diagnoses:  Right Internal Carotid Artery Stenosis   Secondary Diagnoses: Past Medical History  Diagnosis Date  . CAD (coronary artery disease)     a. PCI [place] 2004. b. Angina - balloon angioplasty to LAD 04/21/12 (vessel too small for stent).  . Refusal of blood transfusions as patient is Jehovah's Witness   . HLD (hyperlipidemia)     takes Lipitor daily  . Asthma   . Migraine aura without headache     visual changes  . Chronic lower back pain   . Anxiety     with claustrophobia  . Carotid stenosis 11/2011    critical R, ~30% L by angiogram, pending CEA  . Hypertension, accelerated     takes Lisinopril daily  . Peripheral neuropathy   . 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 MRI: mult small b/l infarcts c/w emboli. MRA neck: high grade RICA stenosis. TEE: EF 55-60% with mild LVH, severe MAC, < 1 cm calc mobile structure on the MV annulus, ? 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 Thought was RICA stenosis did not explain the b/l small strokes; concern for emboli from the mobile calc mitral ann struct - Event  no AFib  . History of kidney stones   . Diabetes mellitus type 2, uncontrolled   . Cataract     left  . Depression     st johns' wart occasionally  . Complication of anesthesia     "I don't want to wake up afterwards" (04/21/2012)  . Arthritis     "right hand; maybe just starting in my left hand" (04/21/2012)    Procedure(s): ENDARTERECTOMY CAROTID  Discharged Condition: good  HPI: [name] is a 56 y.o. female who returns for evaluation of chest pain.  She has a hx of CAD s/p PCI in [place] in 2004, DM2,  and recent CVA. Patient was admitted in 6/13 with confusion and memory difficulty. MRI showed multiple small bilateral infarcts. There was severe right carotid stenosis, but this does not explain the bilateral infarcts. TEE showed a mobile calcified structure attached to the mitral valve. There was heavy mitral annular calcification. 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 It was thought that this structure could be a potential source of embolism and Plavix was started. 3 week event monitor showed no atrial fibrillation.  She needs to have right CEA in the near future with Dr. Myra Gianotti. Dr. Shirlee Latch arranged stress testing this summer due to dyspnea. Lexiscan myoview in 7/13 showed a small old infarct at the inferior base but no ischemia.     Hospital Course:  [name] is a 56 y.o. female is S/P Right Procedure(s): ENDARTERECTOMY CAROTID Extubated: POD # 0 Post-op wounds clean, dry, intact or healing well Pt. Ambulating, voiding and taking PO diet without difficulty. Pt pain controlled with PO pain meds. Labs as below Complications:none  Consults:     Significant Diagnostic Studies: CBC Lab Results  Component Value Date   WBC 7.9 05/10/2012   HGB 9.7* 05/10/2012   HCT 28.4* 05/10/2012   MCV 84.5 05/10/2012   PLT 255 05/10/2012    BMET    Component Value  Date/Time   NA 134* 05/10/2012 0356   K 3.9 05/10/2012 0356   CL 103 05/10/2012 0356   CO2 25 05/10/2012 0356   GLUCOSE 196* 05/10/2012 0356   BUN 14 05/10/2012 0356   CREATININE 0.76 05/10/2012 0356   CALCIUM 8.7 05/10/2012 0356   GFRNONAA >90 05/10/2012 0356   GFRAA >90 05/10/2012 0356   COAG Lab Results  Component Value Date   INR 0.95 05/09/2012   INR 1.0 04/15/2012   INR 1.02 04/14/2012     Disposition:  Discharge to :Home Discharge Orders    Future Appointments: Provider: Department: Dept Phone: Center:   05/15/2012 8:45 AM Beatrice Lecher, PA [facility]  Main Office [place]) [phone] [id number]   05/26/2012 8:45 AM Nada Libman, MD [facility] [phone] [facility]     Future Orders Please Complete By Expires   Resume previous diet      Driving Restrictions      Comments:   No driving for 2 weeks   Lifting restrictions      Comments:   No lifting for 6 weeks   Call MD for:  temperature >100.5      Call MD for:  redness, tenderness, or signs of infection (pain, swelling, bleeding, redness, odor or green/yellow discharge around incision site)      Call MD for:  severe or increased pain, loss or decreased feeling  in affected limb(s)      Discharge wound care:      Comments:   Shower daily with soap and water starting 05/11/12   CAROTID Sugery: Call MD for difficulty swallowing or speaking; weakness in arms or legs that is a new symtom; severe headache.  If you have increased swelling in the neck and/or  are having difficulty breathing, CALL 911         [name], [name]  Home Medication Instructions [id number]   Printed on:05/10/12 0809  Medication Information                    clopidogrel (PLAVIX) 75 MG tablet Take 75 mg by mouth daily.           atorvastatin (LIPITOR) 40 MG tablet Take 40 mg by mouth daily.           insulin NPH (HUMULIN N,NOVOLIN N) 100 UNIT/ML injection Inject 10-40 Units into the skin 2 (two) times daily before a meal. SLIDING SCALE           nitroGLYCERIN (NITROSTAT) 0.4 MG SL tablet Place 0.4 mg under the tongue every 5 (five) minutes as needed. For chest pain           aspirin 81 MG EC tablet Take 81 mg by mouth daily.           bisoprolol (ZEBETA) 5 MG tablet Take 5 mg by mouth daily.           lisinopril (PRINIVIL,ZESTRIL) 20 MG tablet Take 20 mg by mouth 2 (two) times daily.           oxyCODONE (ROXICODONE) 5 MG immediate release tablet Take 1 tablet (5 mg total) by mouth every 6 (six) hours as needed for pain.            Verbal and written Discharge  instructions given to the patient. Wound care per Discharge AVS Follow-up Information    Follow up with Myra Gianotti IV, Lala Lund, MD. In 2 weeks. (Office will call you to arrange your appt (sent))  Contact information:   [address] [place] [address] [phone]          Signed: Clinton Gallant [facility] 05/10/2012, 8:09 AM

## 2012-05-10 NOTE — Progress Notes (Addendum)
VASCULAR AND VEIN SURGERY POST - OP CEA PROGRESS NOTE  Date of Surgery: 05/09/2012  Surgeon(s): Nada Libman, MD 1 Day Post-Op right Carotid Endarterectomy .  HPI: [name] is a 56 y.o. female who is 1 Day Post-Op right Carotid Endarterectomy . Patient is doing well. Pre-operative symptoms are Improved Patient denies headache; Patient denies difficulty swallowing; denies weakness in upper or lower extremities; Pt. denies other symptoms of stroke or TIA.  IMAGING: No results found.  Significant Diagnostic Studies: CBC Lab Results  Component Value Date   WBC 7.9 05/10/2012   HGB 9.7* 05/10/2012   HCT 28.4* 05/10/2012   MCV 84.5 05/10/2012   PLT 255 05/10/2012    BMET    Component Value Date/Time   NA 134* 05/10/2012 0356   K 3.9 05/10/2012 0356   CL 103 05/10/2012 0356   CO2 25 05/10/2012 0356   GLUCOSE 196* 05/10/2012 0356   BUN 14 05/10/2012 0356   CREATININE 0.76 05/10/2012 0356   CALCIUM 8.7 05/10/2012 0356   GFRNONAA >90 05/10/2012 0356   GFRAA >90 05/10/2012 0356    COAG Lab Results  Component Value Date   INR 0.95 05/09/2012   INR 1.0 04/15/2012   INR 1.02 04/14/2012   No results found for this basename: PTT      Intake/Output Summary (Last 24 hours) at 05/10/12 1610 Last data filed at 05/10/12 0300  Gross per 24 hour  Intake   2175 ml  Output     75 ml  Net   2100 ml    Physical Exam:  BP Readings from Last 3 Encounters:  05/10/12 134/57  05/10/12 134/57  04/21/12 127/57   Temp Readings from Last 3 Encounters:  05/10/12 99 F (37.2 C) Oral  05/10/12 99 F (37.2 C) Oral  04/22/12 98.1 F (36.7 C) Oral   SpO2 Readings from Last 3 Encounters:  05/10/12 94%  05/10/12 94%  04/22/12 95%   Pulse Readings from Last 3 Encounters:  05/10/12 76  05/10/12 76  04/21/12 70    Pt is A&O x 3 Gait is normal Speech is fluent right Neck Wound is healing well Patient with Negative tongue deviation and Negative facial droop Pt  has good and equal strength in all extremities  Assessment/Plan:: [name] is a 56 y.o. female is S/P Right Carotid endarterectomy Pt is voiding, ambulating and taking po well    Discharge to: Home Follow-up in 2 weeks   Clinton Gallant [facility] [phone] 05/10/2012 8:08 AM  Addendum  I have independently interviewed and examined the patient, and I agree with the physician assistant's findings.  Neuro intact.  No neck hematoma.  Hemodynamic stability overnight.  Pt can be d/c w/ follow up with Dr. Myra Gianotti in 2 weeks.  Leonides Sake, MD [facility] [place] Office: [phone] Pager: [phone]  05/10/2012, 8:36 AM

## 2012-05-10 NOTE — Progress Notes (Signed)
Discharge instructions given to pt and husband -- verbalized and taught-back follow-up appts, wound care, activity levels, s/s of complications including stroke, when to call MD/EMS and home meds. Renette Butters, Viona Gilmore

## 2012-05-12 ENCOUNTER — Encounter (HOSPITAL_COMMUNITY): Payer: Self-pay | Admitting: Surgery

## 2012-05-12 MED FILL — Mupirocin Oint 2%: CUTANEOUS | Qty: 22 | Status: AC

## 2012-05-12 NOTE — Discharge Summary (Signed)
I agree with the above. The patient did well status post carotid endarterectomy. She'll followup with me in 2 weeks.  Durene Cal

## 2012-05-15 ENCOUNTER — Encounter: Payer: Self-pay | Admitting: Physician Assistant

## 2012-05-21 ENCOUNTER — Encounter: Payer: Self-pay | Admitting: Surgery

## 2012-05-26 ENCOUNTER — Ambulatory Visit (INDEPENDENT_AMBULATORY_CARE_PROVIDER_SITE_OTHER): Payer: Self-pay | Admitting: Surgery

## 2012-05-26 ENCOUNTER — Encounter: Payer: Self-pay | Admitting: Surgery

## 2012-05-26 VITALS — BP 149/84 | HR 69 | Temp 98.1°F | Resp 16 | Ht 65.0 in | Wt 198.0 lb

## 2012-05-26 DIAGNOSIS — I6529 Occlusion and stenosis of unspecified carotid artery: Secondary | ICD-10-CM

## 2012-05-26 DIAGNOSIS — Z48812 Encounter for surgical aftercare following surgery on the circulatory system: Secondary | ICD-10-CM

## 2012-05-26 NOTE — Progress Notes (Signed)
The patient is back today for followup. She is status post right carotid endarterectomy on 05/09/2012. This was done for a high-grade, 85% asymptomatic right carotid stenosis. Her postoperative course was unremarkable. She denies any neurologic changes. She denies any pain. She has a good energy level and appetite.  Physical exam: Her incision is healing nicely. She remains neurologically intact. Her smile is symmetric. Tongue is midline. Strength is equal bilaterally.  Her carotid stenosis was identified during a workup for bilateral infarcts when she presented with confusion. She was found to have a calcification on her aortic valve which is thought to be the etiology of her stroke. She is receiving followup with cardiology for this.  I will have her followup with me in 6 months with a repeat carotid ultrasound. The stenosis on the left side was less than 50%.

## 2012-05-26 NOTE — Addendum Note (Signed)
Addended by: Sharee Pimple on: 05/26/2012 01:42 PM   Modules accepted: Orders

## 2012-08-09 ENCOUNTER — Other Ambulatory Visit: Payer: Self-pay

## 2012-08-12 ENCOUNTER — Other Ambulatory Visit: Payer: Self-pay | Admitting: Cardiology

## 2012-09-04 ENCOUNTER — Ambulatory Visit (INDEPENDENT_AMBULATORY_CARE_PROVIDER_SITE_OTHER): Payer: Self-pay | Admitting: Cardiology

## 2012-09-04 ENCOUNTER — Encounter: Payer: Self-pay | Admitting: Cardiology

## 2012-09-04 VITALS — BP 144/90 | HR 64 | Ht 65.5 in | Wt 194.0 lb

## 2012-09-04 DIAGNOSIS — I2510 Atherosclerotic heart disease of native coronary artery without angina pectoris: Secondary | ICD-10-CM

## 2012-09-04 DIAGNOSIS — I6340 Cerebral infarction due to embolism of unspecified cerebral artery: Secondary | ICD-10-CM

## 2012-09-04 DIAGNOSIS — I10 Essential (primary) hypertension: Secondary | ICD-10-CM

## 2012-09-04 DIAGNOSIS — E785 Hyperlipidemia, unspecified: Secondary | ICD-10-CM

## 2012-09-04 DIAGNOSIS — I639 Cerebral infarction, unspecified: Secondary | ICD-10-CM

## 2012-09-04 NOTE — Patient Instructions (Addendum)
The current medical regimen is effective;  continue present plan and medications.  Follow up in 6 months with Dr Earlean Shawl will receive a letter in the mail 2 months before you are due.  Please call us when you receive this letter to schedule your follow up appointment.

## 2012-09-05 NOTE — Progress Notes (Signed)
Patient ID: [name], female   DOB: 1956/01/13, 57 y.o.   MRN: [id number] PCP: Dr. Sharen Hones  57 yo with history of CAD s/p PCI in [place] in 2004, diabetes, and recent CVA presents for cardiology followup.  Patient was admitted in 6/13 with confusion and memory difficulty.  MRI showed multiple small bilateral infarcts.  There was severe right carotid stenosis, but this does not explain the bilateral infarcts.  TEE showed a mobile calcified structure attached to the mitral valve.  There was heavy mitral annular calcification.  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  It was thought that this structure could be a potential source of embolism and Plavix was started.  3 week event monitor showed no atrial fibrillation. LHC in 10/13 showed 95% mid LAD stenosis.  The vessel was small caliber so she only had PTCA.  She went on to have right CEA in 11/13.  Since CEA, she has been doing quite well.  No exertional chest pain or dyspnea.  She gets chest tightness occasionally only with emotion stress (when she lost her dog, dealing with family problems).  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   Labs (6/13): lupus anticoagulant negative, anticardiolipin antibody negative, LDL 135, creatinine 0.77, blood cultures NGTD Labs (11/13): K 3.9, creatinine 0.76, LDL 87, HDL 49  PMH: 1. Diabetes mellitus  2. Hyperlipidemia 3. HTN 4. CVA: 6/13 admission with confusion and memory problems.  MRI showed multiple small bilateral infarcts c/w emboli.  MRA neck showed high grade RICA stenosis.  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  It was thought that the RICA stenosis could not explain the bilateral small strokes and concern was for emboli from the mobile calcified  mitral annular structure.  3-week event monitor (8/13) showed no atrial fibrillation. Right CEA in 11/13.  5. Carotid stenosis: 85-90% RICA on 6/13 cerebral angiogram.   6. CAD s/p PCI x 2 in 2004 in [place].  Lexiscan myoview (7/13) showed small basal inferior scar with no ischemia, EF 53%, inferobasal hypokinesis.  LHC (10/13) with 95% mLAD stenosis.  The vessel was small caliber, she had PTCA only.   7. Asthma  SH: Lives with husband on farm near [place].  Moved here from [place] for husband's work.  Now unemployed.  Nonsmoker.  Jehovah's Witness.   FH: Mother with valvular heart disease.  Brother and sister with heart "trouble," she is not sure exactly what.   ROS: All systems reviewed and negative except as per HPI.   Current Outpatient Prescriptions  Medication Sig Dispense Refill  . aspirin 81 MG EC tablet Take 81 mg by mouth daily.      Marland Kitchen atorvastatin (LIPITOR) 40 MG tablet Take 40 mg by mouth daily.      . bisoprolol (ZEBETA) 5 MG tablet Take 5 mg by mouth daily.      . clopidogrel (PLAVIX) 75 MG tablet Take 75 mg by mouth daily.      . insulin NPH (HUMULIN N,NOVOLIN N) 100 UNIT/ML injection Inject 10-40 Units into the skin 2 (two) times daily before a meal. SLIDING SCALE      . lisinopril (PRINIVIL,ZESTRIL) 20 MG tablet Take 20 mg by mouth 2 (two) times  daily.      . nitroGLYCERIN (NITROSTAT) 0.4 MG SL tablet Place 0.4 mg under the tongue every 5 (five) minutes as needed. For chest pain       No current facility-administered medications for this visit.    BP 144/90  Pulse 64  Ht 5' 5.5" (1.664 m)  Wt 194 lb (87.998 kg)  BMI 31.78 kg/m2'. General: NAD Neck: No JVD, no thyromegaly or thyroid nodule.  Lungs: Clear to auscultation bilaterally with normal respiratory effort. CV: Nondisplaced PMI.  Heart regular S1/S2, no S3/S4, no murmur.  No peripheral edema.  Right > left carotid bruit.  Normal pedal pulses.  Abdomen: Soft, nontender, no hepatosplenomegaly, no distention.    Neurologic: Alert and oriented x 3.  Psych: Normal affect. Extremities: No clubbing or cyanosis.   Assessment/Plan  1. CAD (coronary artery disease)  Status post PTCA LAD in 10/13 with no exertional chest pain. Continue ASA 81, bisoprolol, Plavix, statin, ACEI.  2. CVA (cerebral infarction)  Multiple bilateral small infarcts found in 6/13. This is not explained by RICA stenosis. I am concerned that she had emboli from the mobile structure on the mitral valve annulus. 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 Blood cultures were negative. She is on Plavix, which I think is probably the appropriate intervention. 3 week event monitor in 8/13 did not show any atrial fibrillation.  3. Hypertension She has been out of BP meds for 2 days, will refill.   4. Carotid stenosis  Severe RICA stenosis, s/p right CEA.  5. Hyperlipidemia Lipids acceptable when last checked.  Continue statin.   [name] 09/05/2012 8:36 AM

## 2012-11-24 ENCOUNTER — Other Ambulatory Visit: Payer: Self-pay

## 2012-11-24 ENCOUNTER — Ambulatory Visit: Payer: Self-pay | Admitting: Surgery

## 2013-04-30 ENCOUNTER — Other Ambulatory Visit: Payer: Self-pay

## 2013-12-10 ENCOUNTER — Emergency Department (HOSPITAL_COMMUNITY)
Admission: EM | Admit: 2013-12-10 | Discharge: 2013-12-10 | Disposition: A | Discharge status: Home / Self Care | Payer: Self-pay | Attending: Emergency Medicine | Admitting: Emergency Medicine

## 2013-12-10 ENCOUNTER — Encounter (HOSPITAL_COMMUNITY): Payer: Self-pay | Admitting: Emergency Medicine

## 2013-12-10 DIAGNOSIS — M545 Low back pain, unspecified: Secondary | ICD-10-CM | POA: Insufficient documentation

## 2013-12-10 DIAGNOSIS — Z7902 Long term (current) use of antithrombotics/antiplatelets: Secondary | ICD-10-CM | POA: Insufficient documentation

## 2013-12-10 DIAGNOSIS — M129 Arthropathy, unspecified: Secondary | ICD-10-CM | POA: Insufficient documentation

## 2013-12-10 DIAGNOSIS — I10 Essential (primary) hypertension: Secondary | ICD-10-CM

## 2013-12-10 DIAGNOSIS — Z8659 Personal history of other mental and behavioral disorders: Secondary | ICD-10-CM | POA: Insufficient documentation

## 2013-12-10 DIAGNOSIS — I2510 Atherosclerotic heart disease of native coronary artery without angina pectoris: Secondary | ICD-10-CM | POA: Insufficient documentation

## 2013-12-10 DIAGNOSIS — Z87442 Personal history of urinary calculi: Secondary | ICD-10-CM | POA: Insufficient documentation

## 2013-12-10 DIAGNOSIS — Z87891 Personal history of nicotine dependence: Secondary | ICD-10-CM | POA: Insufficient documentation

## 2013-12-10 DIAGNOSIS — Z880 Allergy status to penicillin: Secondary | ICD-10-CM | POA: Insufficient documentation

## 2013-12-10 DIAGNOSIS — IMO0001 Reserved for inherently not codable concepts without codable children: Secondary | ICD-10-CM | POA: Insufficient documentation

## 2013-12-10 DIAGNOSIS — J45909 Unspecified asthma, uncomplicated: Secondary | ICD-10-CM | POA: Insufficient documentation

## 2013-12-10 DIAGNOSIS — Z9861 Coronary angioplasty status: Secondary | ICD-10-CM | POA: Insufficient documentation

## 2013-12-10 DIAGNOSIS — E1165 Type 2 diabetes mellitus with hyperglycemia: Secondary | ICD-10-CM

## 2013-12-10 DIAGNOSIS — E785 Hyperlipidemia, unspecified: Secondary | ICD-10-CM | POA: Insufficient documentation

## 2013-12-10 DIAGNOSIS — Z79899 Other long term (current) drug therapy: Secondary | ICD-10-CM | POA: Insufficient documentation

## 2013-12-10 DIAGNOSIS — Z9889 Other specified postprocedural states: Secondary | ICD-10-CM | POA: Insufficient documentation

## 2013-12-10 DIAGNOSIS — H4312 Vitreous hemorrhage, left eye: Secondary | ICD-10-CM

## 2013-12-10 DIAGNOSIS — H4310 Vitreous hemorrhage, unspecified eye: Secondary | ICD-10-CM | POA: Insufficient documentation

## 2013-12-10 DIAGNOSIS — G8929 Other chronic pain: Secondary | ICD-10-CM | POA: Insufficient documentation

## 2013-12-10 LAB — URINALYSIS, ROUTINE W REFLEX MICROSCOPIC
BILIRUBIN URINE: NEGATIVE
Glucose, UA: 250 mg/dL — AB
Hgb urine dipstick: NEGATIVE
KETONES UR: NEGATIVE mg/dL
Leukocytes, UA: NEGATIVE
NITRITE: NEGATIVE
Protein, ur: 30 mg/dL — AB
SPECIFIC GRAVITY, URINE: 1.015 (ref 1.005–1.030)
UROBILINOGEN UA: 0.2 mg/dL (ref 0.0–1.0)
pH: 6.5 (ref 5.0–8.0)

## 2013-12-10 LAB — CBC WITH DIFFERENTIAL/PLATELET
BASOS ABS: 0 10*3/uL (ref 0.0–0.1)
BASOS PCT: 1 % (ref 0–1)
EOS ABS: 0.2 10*3/uL (ref 0.0–0.7)
Eosinophils Relative: 3 % (ref 0–5)
HCT: 40.4 % (ref 36.0–46.0)
HEMOGLOBIN: 13.3 g/dL (ref 12.0–15.0)
Lymphocytes Relative: 49 % — ABNORMAL HIGH (ref 12–46)
Lymphs Abs: 3 10*3/uL (ref 0.7–4.0)
MCH: 27.9 pg (ref 26.0–34.0)
MCHC: 32.9 g/dL (ref 30.0–36.0)
MCV: 84.9 fL (ref 78.0–100.0)
MONOS PCT: 7 % (ref 3–12)
Monocytes Absolute: 0.4 10*3/uL (ref 0.1–1.0)
NEUTROS PCT: 40 % — AB (ref 43–77)
Neutro Abs: 2.5 10*3/uL (ref 1.7–7.7)
Platelets: 331 10*3/uL (ref 150–400)
RBC: 4.76 MIL/uL (ref 3.87–5.11)
RDW: 13.3 % (ref 11.5–15.5)
WBC: 6.2 10*3/uL (ref 4.0–10.5)

## 2013-12-10 LAB — BASIC METABOLIC PANEL
BUN: 14 mg/dL (ref 6–23)
CO2: 22 mEq/L (ref 19–32)
CREATININE: 0.75 mg/dL (ref 0.50–1.10)
Calcium: 10.2 mg/dL (ref 8.4–10.5)
Chloride: 101 mEq/L (ref 96–112)
Glucose, Bld: 94 mg/dL (ref 70–99)
POTASSIUM: 4.3 meq/L (ref 3.7–5.3)
Sodium: 141 mEq/L (ref 137–147)

## 2013-12-10 LAB — URINE MICROSCOPIC-ADD ON

## 2013-12-10 LAB — CBG MONITORING, ED: Glucose-Capillary: 80 mg/dL (ref 70–99)

## 2013-12-10 MED ORDER — LISINOPRIL 20 MG PO TABS
20.0000 mg | ORAL_TABLET | Freq: Two times a day (BID) | ORAL | Status: DC
  Administered 2013-12-10: 20 mg via ORAL
  Filled 2013-12-10: qty 1

## 2013-12-10 MED ORDER — LISINOPRIL 10 MG PO TABS: 20.0000 mg | ORAL_TABLET | Freq: Every day | ORAL | Status: DC

## 2013-12-10 NOTE — Discharge Instructions (Signed)
Hypertension Hypertension, commonly called high blood pressure, is when the force of blood pumping through your arteries is too strong. Your arteries are the blood vessels that carry blood from your heart throughout your body. A blood pressure reading consists of a higher number over a lower number, such as 110/72. The higher number (systolic) is the pressure inside your arteries when your heart pumps. The lower number (diastolic) is the pressure inside your arteries when your heart relaxes. Ideally you want your blood pressure below 120/80. Hypertension forces your heart to work harder to pump blood. Your arteries may become narrow or stiff. Having hypertension puts you at risk for heart disease, stroke, and other problems.  RISK FACTORS Some risk factors for high blood pressure are controllable. Others are not.  Risk factors you cannot control include:   Race. You may be at higher risk if you are African American.  Age. Risk increases with age.  Gender. Men are at higher risk than women before age 45 years. After age 65, women are at higher risk than men. Risk factors you can control include:  Not getting enough exercise or physical activity.  Being overweight.  Getting too much fat, sugar, calories, or salt in your diet.  Drinking too much alcohol. SIGNS AND SYMPTOMS Hypertension does not usually cause signs or symptoms. Extremely high blood pressure (hypertensive crisis) may cause headache, anxiety, shortness of breath, and nosebleed. DIAGNOSIS  To check if you have hypertension, your health care provider will measure your blood pressure while you are seated, with your arm held at the level of your heart. It should be measured at least twice using the same arm. Certain conditions can cause a difference in blood pressure between your right and left arms. A blood pressure reading that is higher than normal on one occasion does not mean that you need treatment. If one blood pressure reading  is high, ask your health care provider about having it checked again. TREATMENT  Treating high blood pressure includes making lifestyle changes and possibly taking medication. Living a healthy lifestyle can help lower high blood pressure. You may need to change some of your habits. Lifestyle changes may include:  Following the DASH diet. This diet is high in fruits, vegetables, and whole grains. It is low in salt, red meat, and added sugars.  Getting at least 2 1/2 hours of brisk physical activity every week.  Losing weight if necessary.  Not smoking.  Limiting alcoholic beverages.  Learning ways to reduce stress. If lifestyle changes are not enough to get your blood pressure under control, your health care provider may prescribe medicine. You may need to take more than one. Work closely with your health care provider to understand the risks and benefits. HOME CARE INSTRUCTIONS  Have your blood pressure rechecked as directed by your health care provider.   Only take medicine as directed by your health care provider. Follow the directions carefully. Blood pressure medicines must be taken as prescribed. The medicine does not work as well when you skip doses. Skipping doses also puts you at risk for problems.   Do not smoke.   Monitor your blood pressure at home as directed by your health care provider. SEEK MEDICAL CARE IF:   You think you are having a reaction to medicines taken.  You have recurrent headaches or feel dizzy.  You have swelling in your ankles.  You have trouble with your vision. SEEK IMMEDIATE MEDICAL CARE IF:  You develop a severe headache or   confusion.  You have unusual weakness, numbness, or feel faint.  You have severe chest or abdominal pain.  You vomit repeatedly.  You have trouble breathing. MAKE SURE YOU:   Understand these instructions.  Will watch your condition.  Will get help right away if you are not doing well or get  worse. Document Released: 06/11/2005 Document Revised: 06/16/2013 Document Reviewed: 04/03/2013 Exit[employer] Patient Information 2015 [employer]. This information is not intended to replace advice given to you by your health care provider. Make sure you discuss any questions you have with your health care provider.  

## 2013-12-10 NOTE — ED Provider Notes (Signed)
CSN: [id number]     Arrival date & time 12/10/13  1903 History  This chart was scribed for Sherry Horseman, PA, working with Flint Melter, MD, by Bronson Curb, ED Scribe. This patient was seen in room TR04C/TR04C and the patient's care was started at 7:30 PM.      Chief Complaint  Patient presents with  . Eye Problem    The history is provided by the patient. No language interpreter was used.    HPI Comments: [name] is a 58 y.o. female with history of CAD, DM, HTN, embolic stroke (0998) who presents to the Emergency Department with a chief complaint of left eye problem onset 1 hour ago. She reports the problem occurred suddenly while she was out working in her garden. Patient suspects her "retina has detached or is detaching", and feels as if she is "looking through lace". She reports history of retinal bleeding, but states this does not feel similar. She denies any modifying factors. Patient also reports history of cataracts in the left eye that has been surgically removed. She denies any complications in the right eye. Patient also denies history of retinal detachment. Patient is established with an eye doctor.   Past Medical History  Diagnosis Date  . CAD (coronary artery disease)     a. PCI [place] 2004. b. Angina - balloon angioplasty to LAD 04/21/12 (vessel too small for stent).  . Refusal of blood transfusions as patient is Jehovah's Witness   . HLD (hyperlipidemia)     takes Lipitor daily  . Asthma   . Migraine aura without headache     visual changes  . Chronic lower back pain   . Anxiety     with claustrophobia  . Carotid stenosis 11/2011    critical R, ~30% L by angiogram, pending CEA  . Hypertension, accelerated     takes Lisinopril daily  . Peripheral neuropathy   . 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 MRI: mult small b/l infarcts c/w emboli. MRA neck: high grade RICA stenosis. TEE: EF 55-60% with mild LVH, severe MAC, < 1 cm calc  mobile structure on the MV annulus, ? 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 Thought was RICA stenosis did not explain the b/l small strokes; concern for emboli from the mobile calc mitral ann struct - Event  no AFib  . History of kidney stones   . Diabetes mellitus type 2, uncontrolled   . Cataract     left  . Depression     st johns' wart occasionally  . Complication of anesthesia     "I don't want to wake up afterwards" (04/21/2012)  . Arthritis     "right hand; maybe just starting in my left hand" (04/21/2012)   Past Surgical History  Procedure Laterality Date  . Kidney stone surgery      several times  . Carpal tunnel release  ~ 2005    bilateral  . Cataract extraction w/ intraocular lens implant  2012    right  . Tee without cardioversion  12/14/2011    Procedure: TRANSESOPHAGEAL ECHOCARDIOGRAM (TEE);  Surgeon: Laurey Morale, MD;  Location: [facility] ENDOSCOPY;  Service: Cardiovascular;  Laterality: N/A;  . Ankle surgery  2005    after fall down stairs, left with screws and plate in place  . Holter monitor  2013    WNL  . US echocardiography  2002    normal LV fxn, mild LVH, borderline  L atrial size  . Lithotripsy    . Coronary angioplasty with stent placement  2002    x2  . Cardiac catheterization  2004    WNL, no significant coronary disease  . Cardiac catheterization  12/19/11  . Coronary angioplasty  04/21/2012    95% stenosis mid LAD, vessel not large enough for stent, rec plavix/ASA for 1 mo  . Endarterectomy  05/09/2012    Procedure: ENDARTERECTOMY CAROTID;  Surgeon: Nada Libman, MD;  Location: [facility] OR;  Service: Vascular;  Laterality: Right;  Right carotd endarterectomy with bovine patch angioplasty  . Carotid endarterectomy     Family History  Problem Relation Age of Onset  . Heart disease Mother     valve problems  . Heart disease Sister     valve problems  . Heart disease Brother   . Stroke Mother   . Stroke Brother 71  . Diabetes Father    . Cancer Neg Hx   . Hypertension Father   . Hypertension Mother    History  Substance Use Topics  . Smoking status: Former Smoker -- 1.00 packs/day for 3 years    Types: Cigarettes    Quit date: 06/26/1975  . Smokeless tobacco: Never Used  . Alcohol Use: No     Comment: none since 11/2011   OB History   Grav Para Term Preterm Abortions TAB SAB Ect Mult Living                 Review of Systems  Constitutional: Negative for fever and chills.  Eyes: Positive for visual disturbance.  Respiratory: Negative for shortness of breath.   Cardiovascular: Negative for chest pain.  Gastrointestinal: Negative for nausea, vomiting, diarrhea and constipation.  Genitourinary: Negative for dysuria.      Allergies  Isosorbide; Metformin and related; Penicillins; Toradol; and Other  Home Medications   Prior to Admission medications   Medication Sig Start Date End Date Taking? Authorizing Vielka Klinedinst  aspirin 81 MG EC tablet Take 81 mg by mouth daily. 04/22/12   Dayna N Dunn, PA-C  atorvastatin (LIPITOR) 40 MG tablet Take 40 mg by mouth daily.    Historical Maeven Mcdougall, MD  bisoprolol (ZEBETA) 5 MG tablet Take 5 mg by mouth daily. 04/22/12   Dayna N Dunn, PA-C  clopidogrel (PLAVIX) 75 MG tablet Take 75 mg by mouth daily.    Historical Shikha Bibb, MD  insulin NPH (HUMULIN N,NOVOLIN N) 100 UNIT/ML injection Inject 10-40 Units into the skin 2 (two) times daily before a meal. SLIDING SCALE    Historical Altair Appenzeller, MD  lisinopril (PRINIVIL,ZESTRIL) 20 MG tablet Take 20 mg by mouth 2 (two) times daily. 04/22/12   Dayna N Dunn, PA-C  nitroGLYCERIN (NITROSTAT) 0.4 MG SL tablet Place 0.4 mg under the tongue every 5 (five) minutes as needed. For chest pain    Historical Jhair Witherington, MD   Triage Vitals: BP 222/111  Pulse 97  Temp(Src) 98 F (36.7 C) (Oral)  Resp 18  Wt 202 lb 9 oz (91.882 kg)  SpO2 97%  Physical Exam  Nursing note and vitals reviewed. Constitutional: She is oriented to person, place, and  time. She appears well-developed and well-nourished. No distress.  HENT:  Head: Normocephalic and atraumatic.  Eyes: Conjunctivae, EOM and lids are normal. Right eye exhibits no discharge and no exudate. No foreign body present in the right eye. Left eye exhibits no discharge and no exudate. No foreign body present in the left eye. Right conjunctiva is not injected. Right conjunctiva  has no hemorrhage. Left conjunctiva is not injected. Left conjunctiva has no hemorrhage. No scleral icterus. Right eye exhibits normal extraocular motion. Left eye exhibits normal extraocular motion. Right pupil is round and reactive. Left pupil is round and reactive.  Fundoscopic exam:      The right eye shows no arteriolar narrowing, no AV nicking, no exudate, no hemorrhage and no papilledema. The right eye shows no red reflex and no venous pulsations.       The left eye shows no arteriolar narrowing, no AV nicking, no exudate, no hemorrhage and no papilledema. The left eye shows no red reflex and no venous pulsations.   Visual Acuity    Bilateral Near   20      Bilateral Distance   30      R Near   20      R Distance   20      L Near   70      L Distance   100   No obvious abnormality on fundoscopic exam  Neck: Neck supple. No tracheal deviation present.  Cardiovascular: Normal rate.   Pulmonary/Chest: Effort normal. No respiratory distress.  Musculoskeletal: Normal range of motion.  Neurological: She is alert and oriented to person, place, and time.  Skin: Skin is warm and dry.  Psychiatric: She has a normal mood and affect. Her behavior is normal.    ED Course  Procedures (including critical care time)  DIAGNOSTIC STUDIES: Oxygen Saturation is 97% on room air, adequate by my interpretation.    COORDINATION OF CARE: At 1934 Discussed treatment plan with patient which includes visual acuity screening. Patient agrees.   Results for orders placed during the hospital encounter of 12/10/13  CBC WITH  DIFFERENTIAL      Result Value Ref Range   WBC 6.2  4.0 - 10.5 K/uL   RBC 4.76  3.87 - 5.11 MIL/uL   Hemoglobin 13.3  12.0 - 15.0 g/dL   HCT 16.1  09.6 - 04.5 %   MCV 84.9  78.0 - 100.0 fL   MCH 27.9  26.0 - 34.0 pg   MCHC 32.9  30.0 - 36.0 g/dL   RDW 40.9  81.1 - 91.4 %   Platelets 331  150 - 400 K/uL   Neutrophils Relative % 40 (*) 43 - 77 %   Neutro Abs 2.5  1.7 - 7.7 K/uL   Lymphocytes Relative 49 (*) 12 - 46 %   Lymphs Abs 3.0  0.7 - 4.0 K/uL   Monocytes Relative 7  3 - 12 %   Monocytes Absolute 0.4  0.1 - 1.0 K/uL   Eosinophils Relative 3  0 - 5 %   Eosinophils Absolute 0.2  0.0 - 0.7 K/uL   Basophils Relative 1  0 - 1 %   Basophils Absolute 0.0  0.0 - 0.1 K/uL  BASIC METABOLIC PANEL      Result Value Ref Range   Sodium 141  137 - 147 mEq/L   Potassium 4.3  3.7 - 5.3 mEq/L   Chloride 101  96 - 112 mEq/L   CO2 22  19 - 32 mEq/L   Glucose, Bld 94  70 - 99 mg/dL   BUN 14  6 - 23 mg/dL   Creatinine, Ser 7.82  0.50 - 1.10 mg/dL   Calcium 95.6  8.4 - 21.3 mg/dL   GFR calc non Af Amer >90  >90 mL/min   GFR calc Af Amer >90  >90 mL/min  URINALYSIS, ROUTINE W REFLEX MICROSCOPIC      Result Value Ref Range   Color, Urine YELLOW  YELLOW   APPearance CLEAR  CLEAR   Specific Gravity, Urine 1.015  1.005 - 1.030   pH 6.5  5.0 - 8.0   Glucose, UA 250 (*) NEGATIVE mg/dL   Hgb urine dipstick NEGATIVE  NEGATIVE   Bilirubin Urine NEGATIVE  NEGATIVE   Ketones, ur NEGATIVE  NEGATIVE mg/dL   Protein, ur 30 (*) NEGATIVE mg/dL   Urobilinogen, UA 0.2  0.0 - 1.0 mg/dL   Nitrite NEGATIVE  NEGATIVE   Leukocytes, UA NEGATIVE  NEGATIVE  URINE MICROSCOPIC-ADD ON      Result Value Ref Range   Squamous Epithelial / LPF RARE  RARE   WBC, UA 0-2  <3 WBC/hpf   RBC / HPF 0-2  <3 RBC/hpf  CBG MONITORING, ED      Result Value Ref Range   Glucose-Capillary 80  70 - 99 mg/dL   No results found.    EKG Interpretation None      MDM   Final diagnoses:  Vitreous hemorrhage, left   Essential hypertension    Patient with uncontrolled HTN and DM presents with floaters to left eye.  Describes them as a veil.  Discussed the patient with Dr. Gwen PoundsKowalski and relayed my findings to him.  Dr. Gwen PoundsKowalski suspects vitreous hemorrhage and recommends having the patient sleep sitting up and then seeing the patient in the office tomorrow.    Additionally, patient has very high blood pressure.  She currently denies and headaches, dizziness, or difficulty urinating.  Doubt hypertensive crisis.  Suspect that the patient's BP has been this elevated for some time as she has been non-compliant in nearly every facet of her health. She states that she has not taken anything for blood pressure in about a year.  She was taking lisinopril, but has not taken it because she has run out and doesn't have money to get more.  I will give her a dose here.  10:37 PM Labs are reassuring.  No evidence of end organ damage.  Patient states that her pressure is consistently in the 190s.  Discussed with Dr. Effie ShyWentz, who agrees that there is no evidence of hypertensive emergency.  Discharge to home with plan per Dr. Gwen PoundsKowalski.  Filed Vitals:   12/10/13 2244  BP: 188/82  Pulse: 86  Temp:   Resp: 24    I personally performed the services described in this documentation, which was scribed in my presence. The recorded information has been reviewed and is accurate.     Sherry Horsemanobert Browning, PA-C 12/10/13 2253

## 2013-12-10 NOTE — ED Notes (Signed)
Pt states that she was outside and she believed that she was having floaters but when she went inside she noticed that it was more a a full coverage veil. Pt states just left eye affected.

## 2013-12-10 NOTE — ED Notes (Signed)
Pt comfortable with discharge and follow up instructions. Prescriptions. Pt instructed to sleep upright tonight and call eye doctor in the morning. Pt declines wheelchair, escorted to waiting area by this RN.

## 2013-12-11 NOTE — ED Provider Notes (Signed)
Medical screening examination/treatment/procedure(s) were performed by non-physician practitioner and as supervising physician I was immediately available for consultation/collaboration.   EKG Interpretation None       Flint Melter, MD 12/11/13 [phone]

## 2013-12-14 ENCOUNTER — Encounter (INDEPENDENT_AMBULATORY_CARE_PROVIDER_SITE_OTHER): Payer: Self-pay | Admitting: Ophthalmology

## 2013-12-17 DIAGNOSIS — E113599 Type 2 diabetes mellitus with proliferative diabetic retinopathy without macular edema, unspecified eye: Secondary | ICD-10-CM | POA: Insufficient documentation

## 2014-02-22 IMAGING — CT CT HEAD W/O CM
1 of 2 series · 13 of 30 positions shown, 17 images · non-contrast
Comparison: None

CLINICAL DATA: Altered mental status, confusion

CT HEAD WITHOUT CONTRAST
TECHNIQUE: Contiguous axial images were obtained from the base of
the skull through the vertex without contrast.

[Series 2: brain · axial · 0.47mm/px · z∈[+106,+229]mm · 13 of 28 slices shown, 17 images]
[im 2/28  brain]
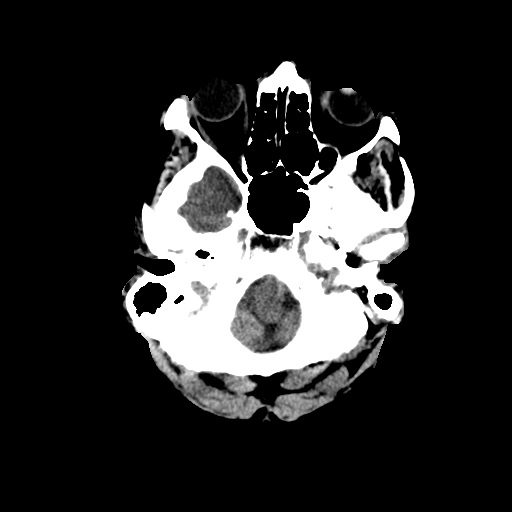
[im 2/28  bone]
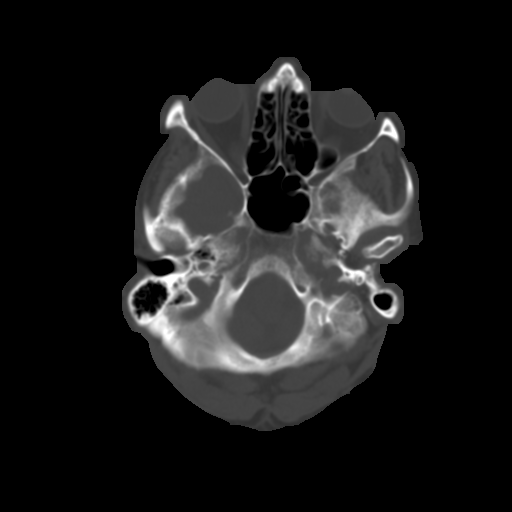
[im 4/28  brain]
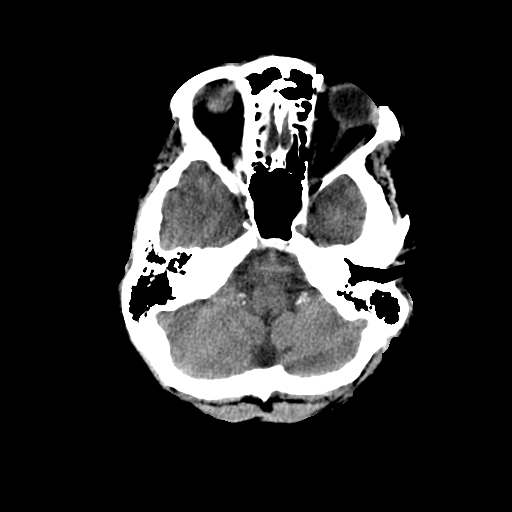
[im 6/28  brain]
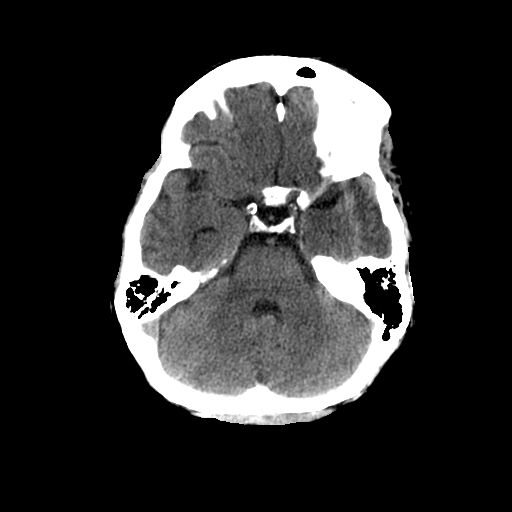
[im 8/28  brain]
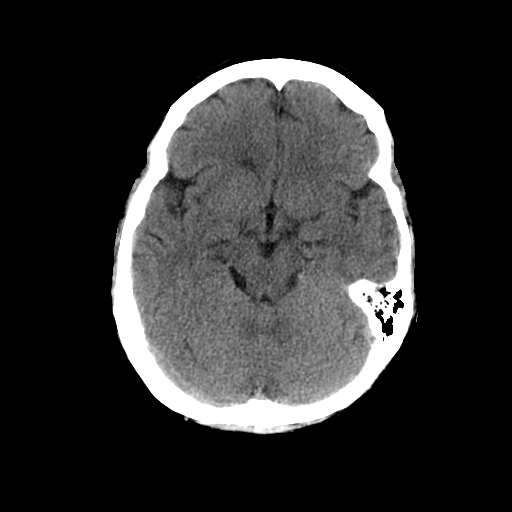
[im 10/28  brain]
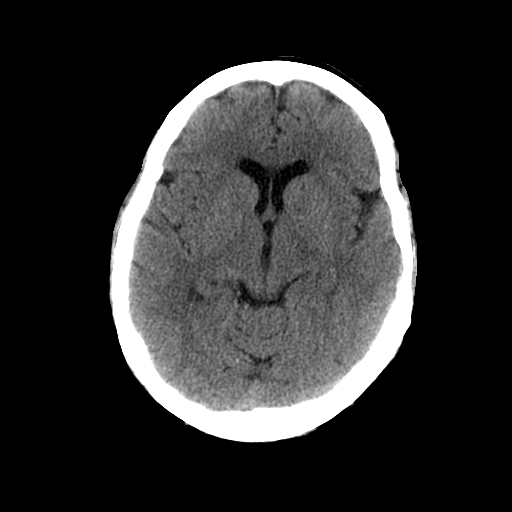
[im 10/28  bone]
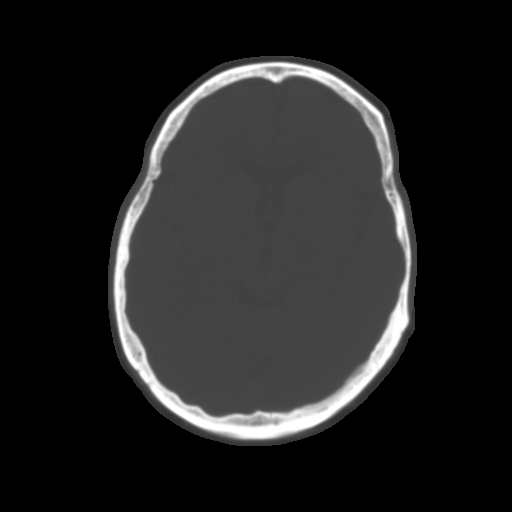
[im 12/28  brain]
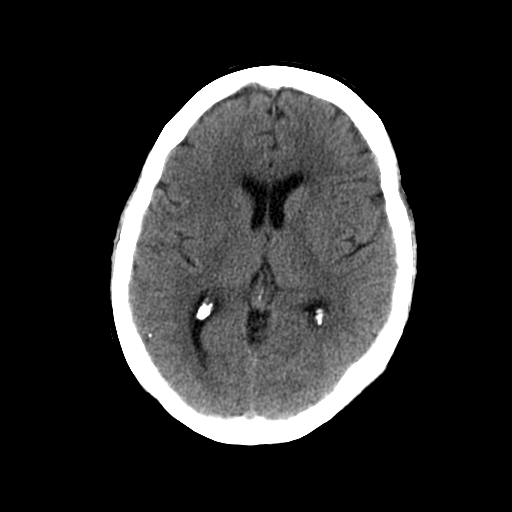
[im 14/28  brain]
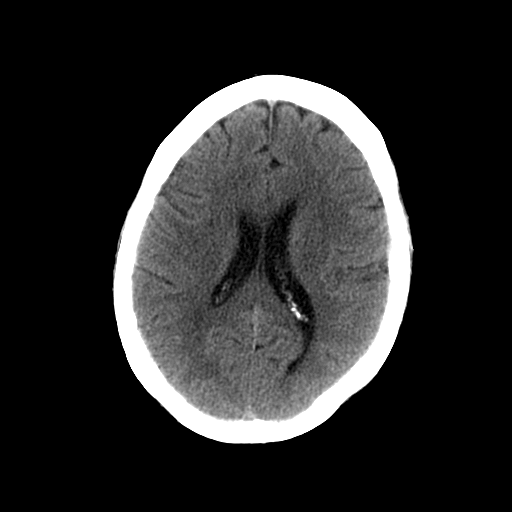
[im 16/28  brain]
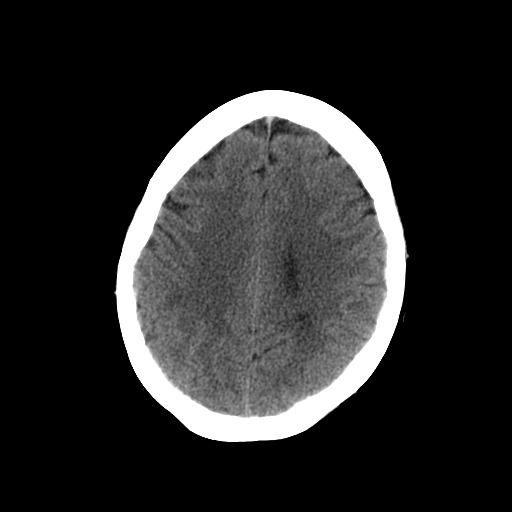
[im 18/28  brain]
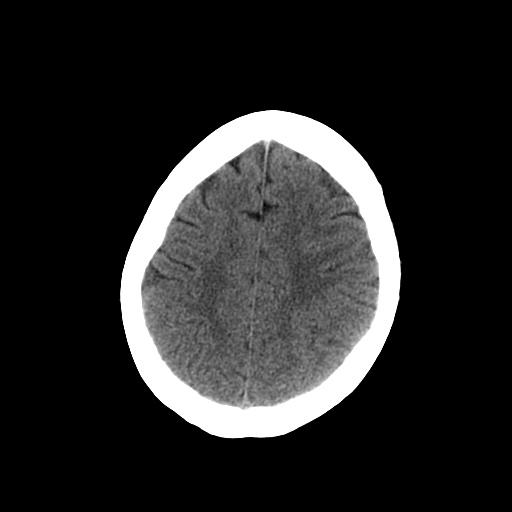
[im 18/28  bone]
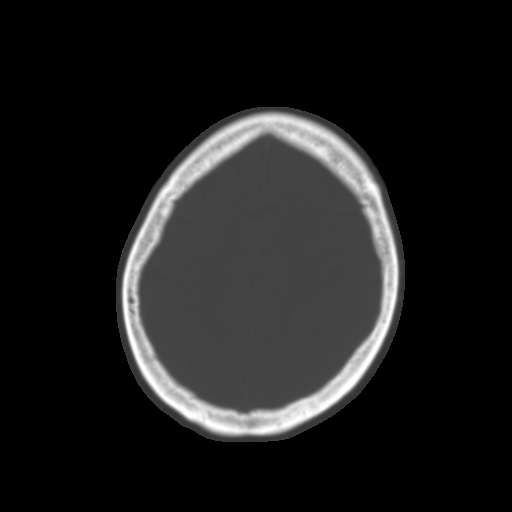
[im 20/28  brain]
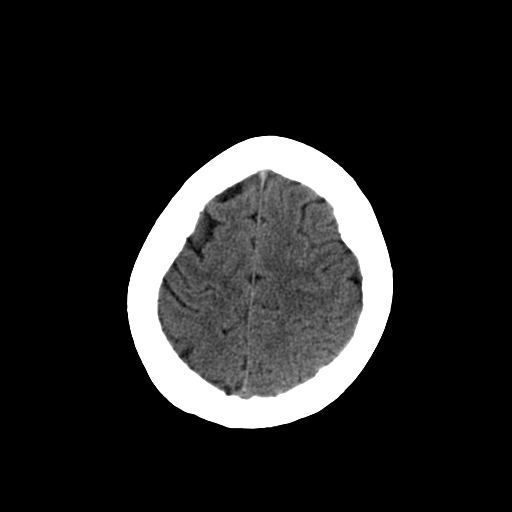
[im 22/28  brain]
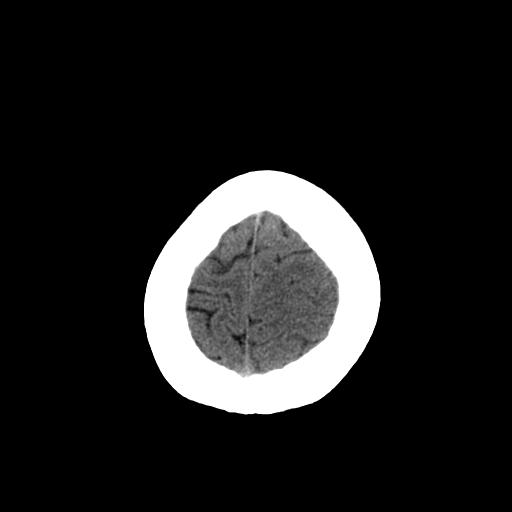
[im 24/28  brain]
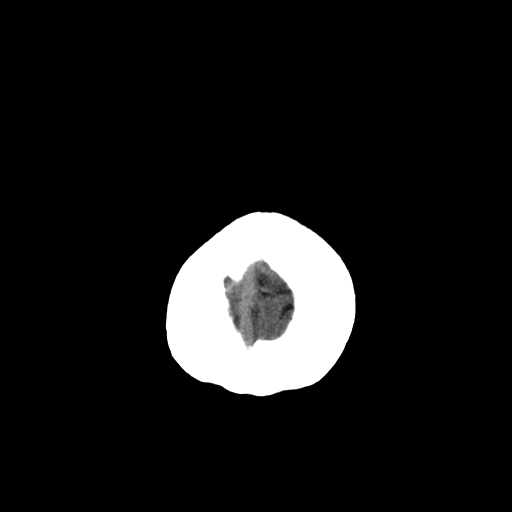
[im 26/28  brain]
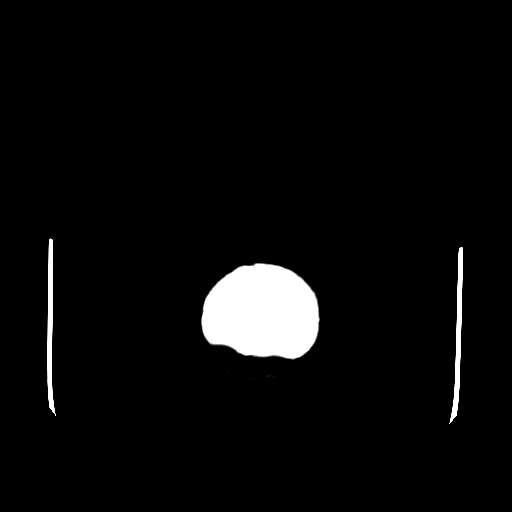
[im 26/28  bone]
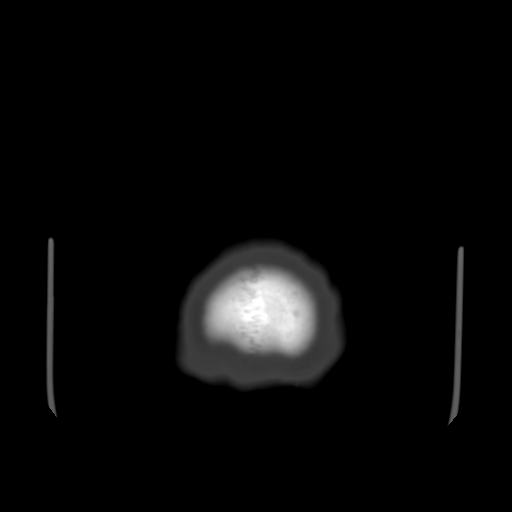

[13 of 30 positions shown; findings below may reference images not displayed]

FINDINGS: Normal ventricular morphology.
No midline shift or mass effect.
Scattered areas of white matter hypoattenuation question small
vessel chronic ischemic change.
No intracranial hemorrhage, mass lesion evidence of acute
infarction.
No extra-axial fluid collections.
Atherosclerotic calcification of internal carotid arteries
bilaterally at skull base.
Visualized paranasal sinuses and mastoid air cells clear.
No acute calvarial abnormalities.
IMPRESSION: Question small vessel chronic ischemic changes of deep cerebral
white matter.
No acute intracranial abnormalities.

## 2014-02-22 IMAGING — CR DG CHEST 2V
2 series · 2 of 2 positions shown · non-contrast
Comparison: 06/04/2008

CLINICAL DATA: Altered mental status, weakness, hypertension

CHEST - 2 VIEW

[w chest pa]
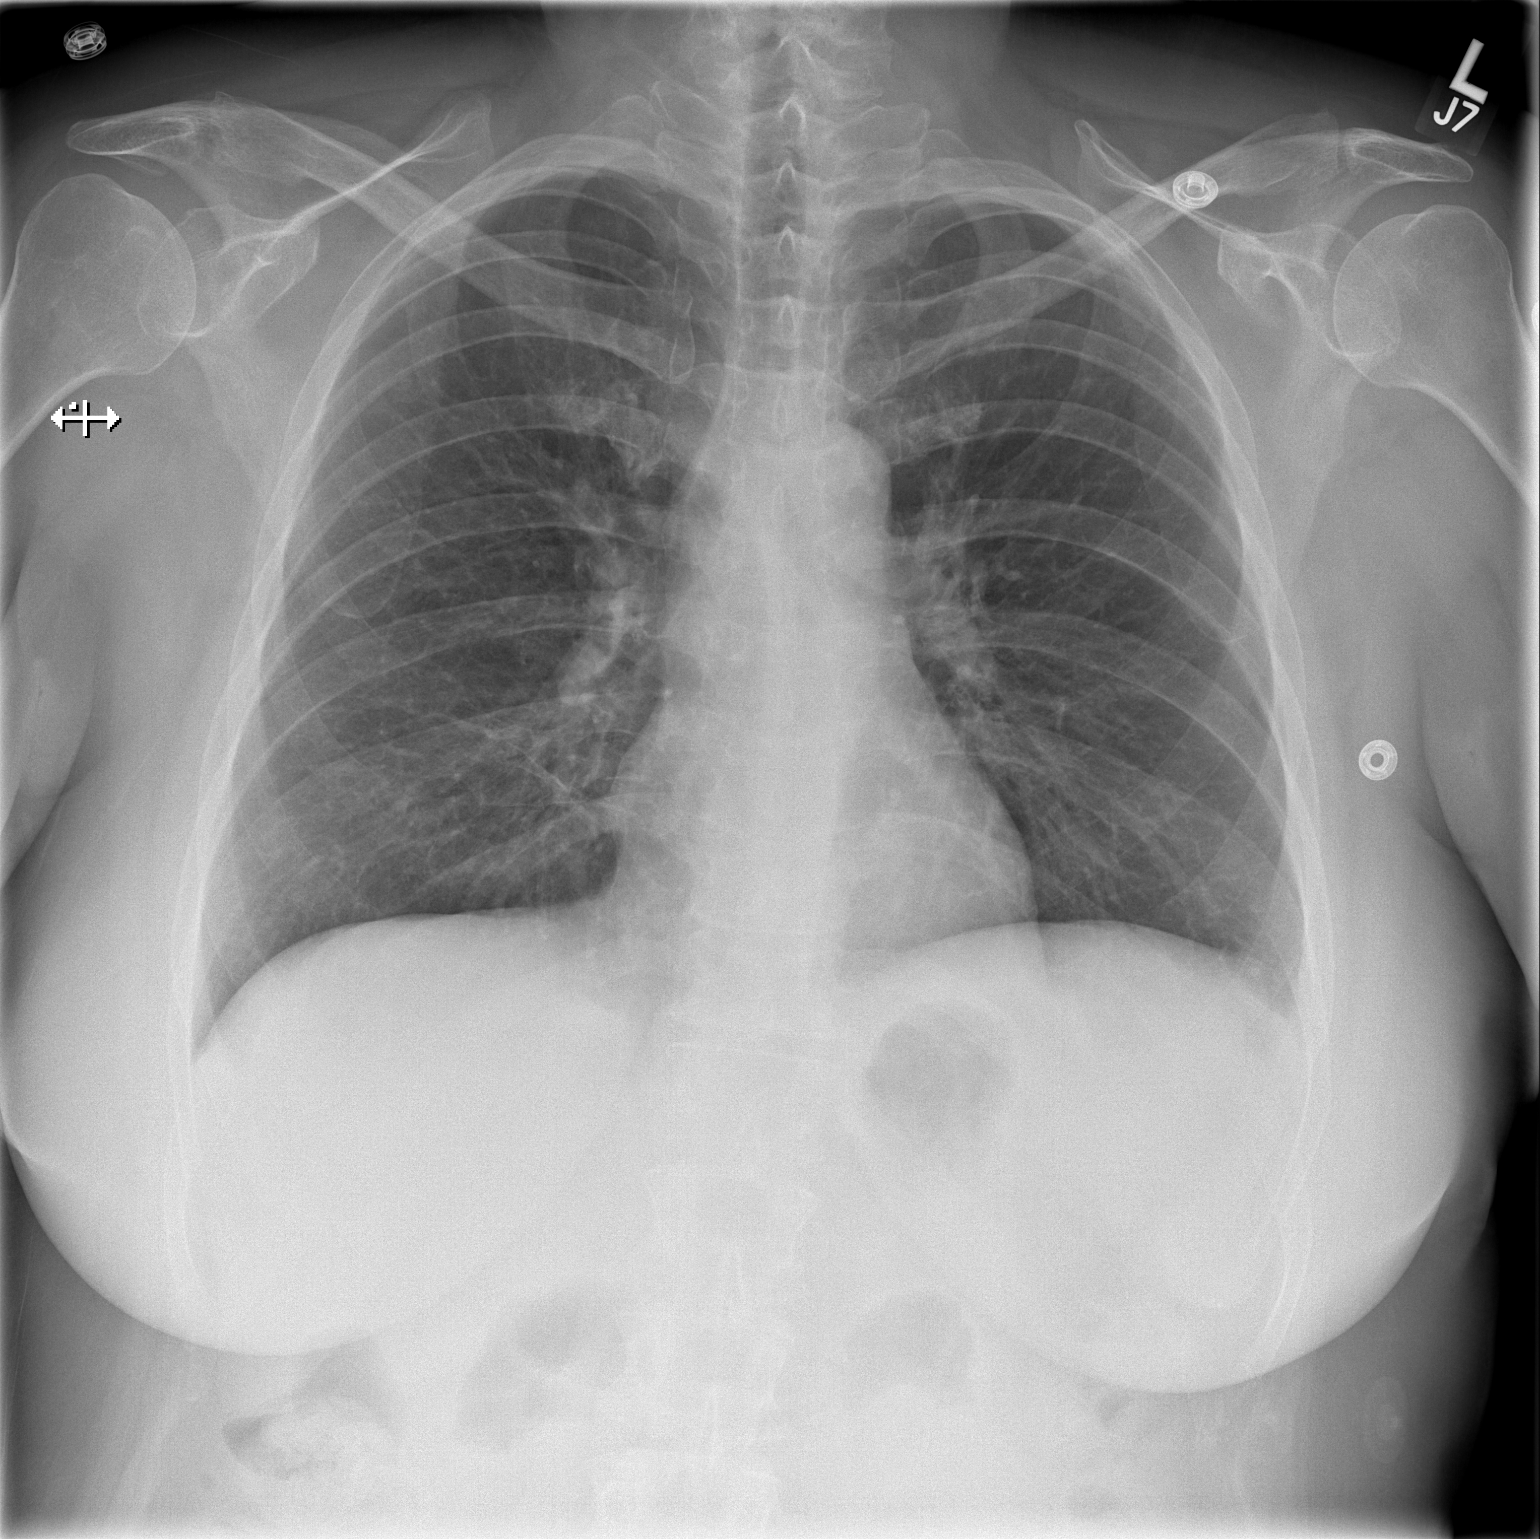

[w chest lat]
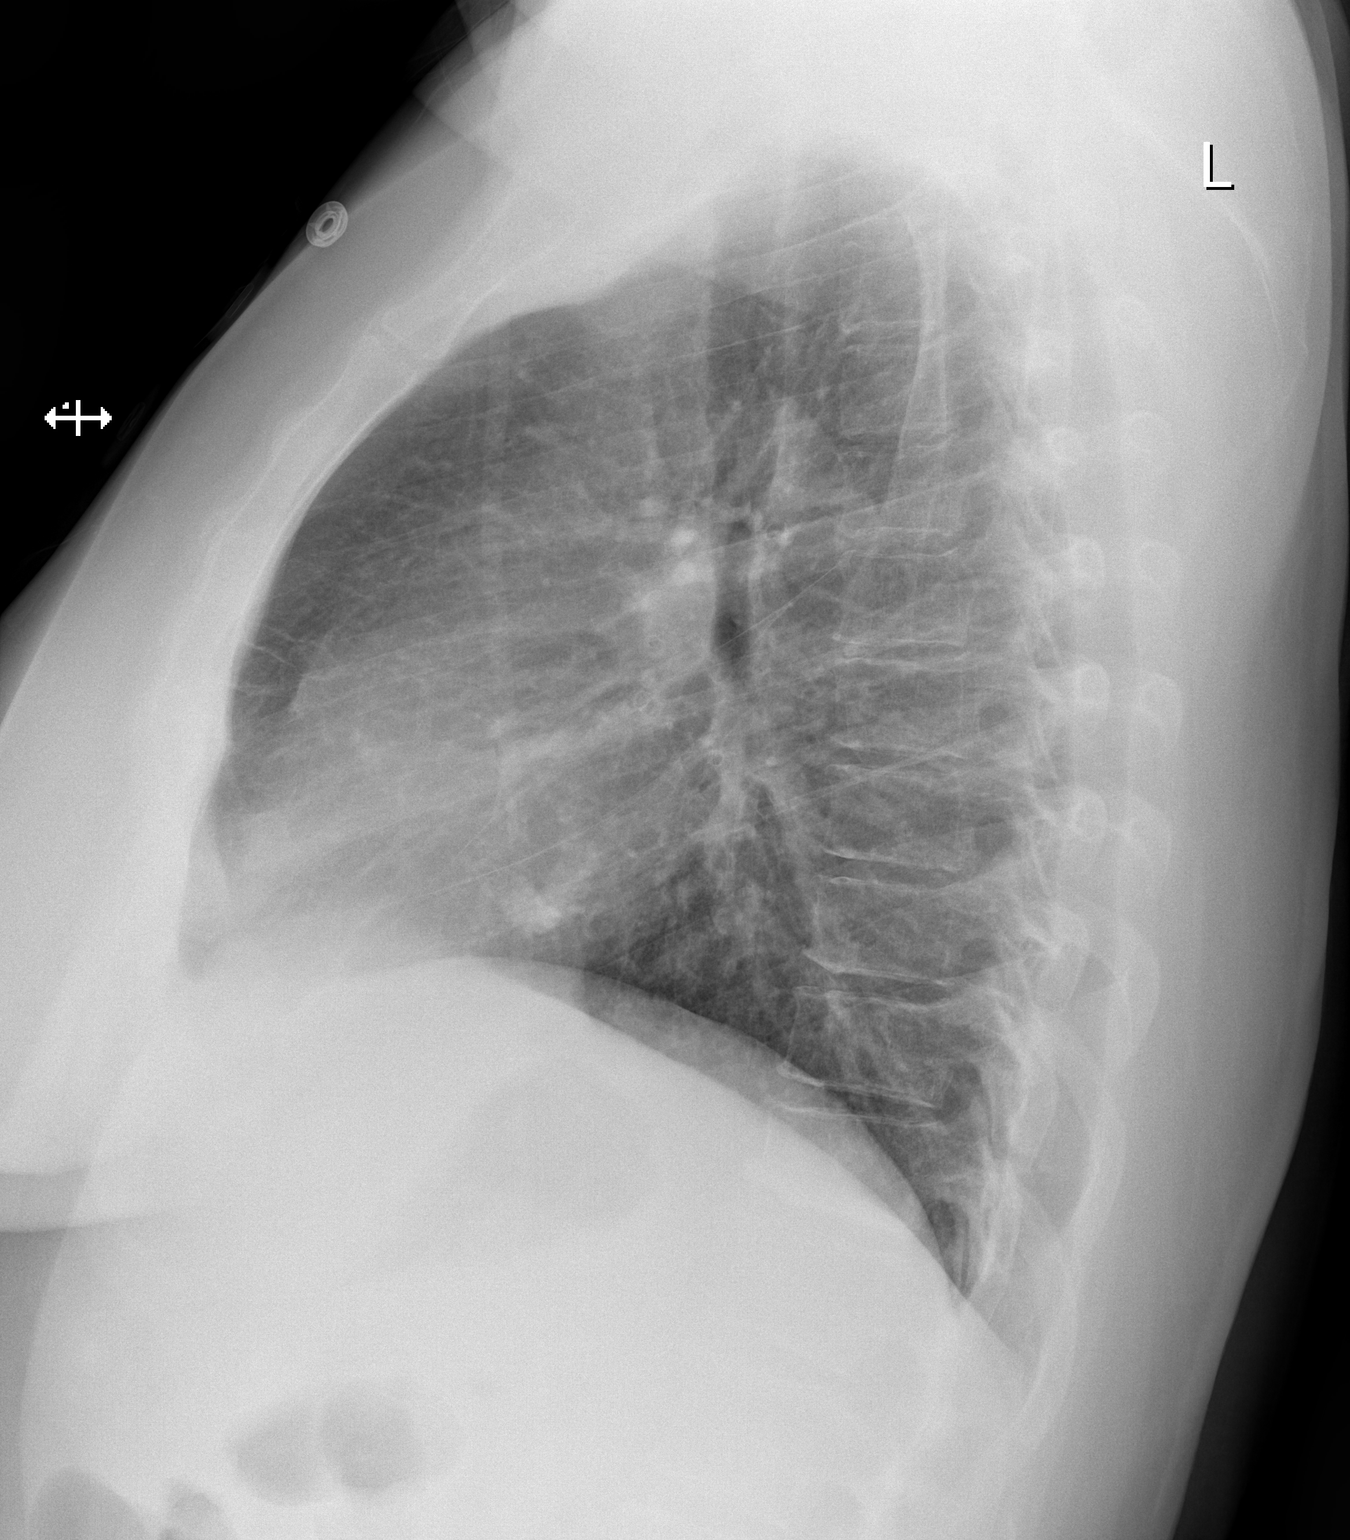

[2 of 2 positions shown; findings below may reference images not displayed]

FINDINGS: Normal heart size, mediastinal contours, and pulmonary vascularity.
Minimal scarring right middle lobe.
Minimal bronchitic changes.
Lungs otherwise clear.
No pleural effusion or pneumothorax.
No acute osseous findings.
IMPRESSION: Minimal bronchitic changes and right middle lobe scarring.

## 2014-05-24 ENCOUNTER — Ambulatory Visit: Payer: Self-pay | Admitting: Neurology

## 2014-05-24 ENCOUNTER — Other Ambulatory Visit: Payer: Self-pay | Admitting: Nurse Practitioner

## 2014-05-24 ENCOUNTER — Inpatient Hospital Stay: Payer: Self-pay | Admitting: Internal Medicine

## 2014-05-24 ENCOUNTER — Encounter: Payer: Self-pay | Admitting: Nurse Practitioner

## 2014-05-24 DIAGNOSIS — R7989 Other specified abnormal findings of blood chemistry: Secondary | ICD-10-CM

## 2014-05-24 DIAGNOSIS — I10 Essential (primary) hypertension: Secondary | ICD-10-CM

## 2014-05-24 DIAGNOSIS — I2510 Atherosclerotic heart disease of native coronary artery without angina pectoris: Secondary | ICD-10-CM

## 2014-05-24 DIAGNOSIS — I349 Nonrheumatic mitral valve disorder, unspecified: Secondary | ICD-10-CM

## 2014-05-24 LAB — CBC
HCT: 37.5 % (ref 35.0–47.0)
HGB: 12.4 g/dL (ref 12.0–16.0)
MCH: 28.1 pg (ref 26.0–34.0)
MCHC: 33 g/dL (ref 32.0–36.0)
MCV: 85 fL (ref 80–100)
PLATELETS: 349 10*3/uL (ref 150–440)
RBC: 4.4 10*6/uL (ref 3.80–5.20)
RDW: 13.2 % (ref 11.5–14.5)
WBC: 6.5 10*3/uL (ref 3.6–11.0)

## 2014-05-24 LAB — COMPREHENSIVE METABOLIC PANEL
ALK PHOS: 102 U/L
ALT: 21 U/L
Albumin: 3.4 g/dL (ref 3.4–5.0)
Anion Gap: 9 (ref 7–16)
BILIRUBIN TOTAL: 0.3 mg/dL (ref 0.2–1.0)
BUN: 13 mg/dL (ref 7–18)
CO2: 26 mmol/L (ref 21–32)
CREATININE: 0.86 mg/dL (ref 0.60–1.30)
Calcium, Total: 8.7 mg/dL (ref 8.5–10.1)
Chloride: 105 mmol/L (ref 98–107)
GLUCOSE: 212 mg/dL — AB (ref 65–99)
OSMOLALITY: 286 (ref 275–301)
POTASSIUM: 3.9 mmol/L (ref 3.5–5.1)
SGOT(AST): 22 U/L (ref 15–37)
SODIUM: 140 mmol/L (ref 136–145)
TOTAL PROTEIN: 7.1 g/dL (ref 6.4–8.2)

## 2014-05-24 LAB — LIPID PANEL
CHOLESTEROL: 220 mg/dL — AB (ref 0–200)
HDL Cholesterol: 51 mg/dL (ref 40–60)
Ldl Cholesterol, Calc: 136 mg/dL — ABNORMAL HIGH (ref 0–100)
Triglycerides: 164 mg/dL (ref 0–200)
VLDL Cholesterol, Calc: 33 mg/dL (ref 5–40)

## 2014-05-24 LAB — TROPONIN I: Troponin-I: 0.06 ng/mL — ABNORMAL HIGH

## 2014-05-24 LAB — HEMOGLOBIN A1C: HEMOGLOBIN A1C: 8.9 % — AB (ref 4.2–6.3)

## 2014-05-25 DIAGNOSIS — I639 Cerebral infarction, unspecified: Secondary | ICD-10-CM

## 2014-05-25 DIAGNOSIS — G459 Transient cerebral ischemic attack, unspecified: Secondary | ICD-10-CM

## 2014-05-25 HISTORY — DX: Cerebral infarction, unspecified: I63.9

## 2014-06-03 ENCOUNTER — Encounter (HOSPITAL_COMMUNITY): Payer: Self-pay | Admitting: Surgery

## 2014-10-16 NOTE — Consult Note (Signed)
General Aspect left carotid stenosis   Present Illness The patient is a 59 year old female with a past medical history significant for hypertension, history of coronary artery disease status post stent in 2005, and history of CVA in 2013, status post right carotid endarterectomy, history of diabetes mellitus on insulin who presented to the Emergency Room with complaints of sudden onset of slurred speech associated with some left facial droop with drooling. On presentation to the Emergency Room her speech and facial droop had resolved completely, and by the time the patient was evaluated by the ED physician, the patient did not have any residual focal deficit.   PAST MEDICAL HISTORY:  1.  Hypertension.  2.  Coronary artery disease, status post stent in 2005.  3.  History of CVA in 2013.  4.  Status post right carotid endarterectomy in 2013.  5.  Diabetes mellitus, on insulin sliding scale.   PAST SURGICAL HISTORY:  1.  Status post right carotid endarterectomy in 2013.  2.  Carpal tunnel syndrome release surgery.  3.  Foot surgery for some injury.   Home Medications: Medication Instructions Status  lisinopril 20 mg oral tablet 1 tab(s) orally 2 times a day Active  metFORMIN 500 mg oral tablet 1 tab(s) orally 2 times a day (with meals) Active  atorvastatin 20 mg oral tablet 1 tab(s) orally once a day Active  aspirin 81 mg oral delayed release tablet 1 tab(s) orally once a day Active  carvedilol 6.25 mg oral tablet 1 tab(s) orally 2 times a day Active  amLODIPine 5 mg oral tablet 1 tab(s) orally once a day Active  HumuLIN N unit(s) subcutaneous per sliding scale  Active    Pollen: Unknown  Penicillin: Swelling, Rash  Case History:  Family History Non-Contributory   Social History negative tobacco, negative ETOH, negative Illicit drugs   Review of Systems:  Fever/Chills No   Cough No   Sputum No   Abdominal Pain No   Diarrhea No   Constipation No   Nausea/Vomiting No    SOB/DOE No   Chest Pain No   Telemetry Reviewed NSR   Dysuria No   Physical Exam:  GEN well developed, well nourished, no acute distress   HEENT hearing intact to voice, dry oral mucosa   NECK supple  trachea midline  left carotid bruit, right CEA incisional scar   RESP normal resp effort  no use of accessory muscles   CARD regular rate  no JVD   ABD denies tenderness  soft   EXTR negative cyanosis/clubbing, negative edema   SKIN No rashes, No ulcers   NEURO cranial nerves intact, follows commands, motor/sensory function intact   PSYCH alert, A+O to time, place, person, good insight   Nursing/Ancillary Notes: **Vital Signs.:   01-Dec-15 11:25  Vital Signs Type Routine  Temperature Temperature (F) 98.5  Celsius 36.9  Temperature Source oral  Pulse Pulse 65  Respirations Respirations 20  Systolic BP Systolic BP 829  Diastolic BP (mmHg) Diastolic BP (mmHg) 562  Mean BP 131  Pulse Ox % Pulse Ox % 94  Pulse Ox Activity Level  At rest  Oxygen Delivery Room Air/ 21 %   Microbiology:  30-Nov-15 00:38   Microalbumin, Random Urine ========== TEST NAME ==========  ========= RESULTS =========  = REFERENCE RANGE =  MICROALBUMIN,UR,RANDOM  Microalbumin, Random Urine Microalbumin, Urine             [H  66.4 ug/mL           ]  0.0-17.0               [facility]            No: [id number]           [address], [place], [place] [address]           Lindon Romp, MD         [phone]   Result(s) reported on 25 May 2014 at 10:51AM.  LabObservation:  30-Nov-15 14:04   OBSERVATION Reason for Test  Hepatic:  30-Nov-15 00:38   Bilirubin, Total 0.3  Alkaline Phosphatase 102 (46-116 NOTE: New Reference Range 01/12/14)  SGPT (ALT) 21 (14-63 NOTE: New Reference Range 01/12/14)  SGOT (AST) 22  Total Protein, Serum 7.1  Albumin, Serum 3.4  Cardiology:  30-Nov-15 00:41   Ventricular Rate 97  Atrial Rate 97  P-R Interval 152  QRS Duration  98  QT 382  QTc 485  P Axis 39  R Axis -11  T Axis 52  ECG interpretation Normal sinus rhythm Inferior infarct , age undetermined Cannot rule out Anterior infarct , age undetermined Abnormal ECG When compared with ECG of 02-Jun-2008 18:22, Nonspecific T wave abnormality no longer evident in Lateral leads ----------unconfirmed---------- Confirmed by OVERREAD, NOT ([id number]), editor PEARSON, BARBARA ([id number]) on 05/24/2014 3:19:27 PM    14:04   Echo Doppler REASON FOR EXAM:     COMMENTS:     PROCEDURE: [facility] - ECHO DOPPLER COMPLETE(TRANSTHOR)  - May 24 2014  2:04PM   RESULT: Echocardiogram Report  Patient Name:   ALAIJAH GIBLER Date of Exam: 05/24/2014 Medical Rec #:  [id number]           Custom1: Date of Birth:  06-Oct-1955        Height:       66.0 in Patient Age:    66 years         Weight:       200.0 lb Patient Gender: F                BSA:          2.00 m??  Indications: TIA Sonographer:    Sherrie Sport RDCS Referring Phys: Azucena Freed, N  Sonographer Comments: Suboptimal apical window.  Summary:  1. Left ventricular ejection fraction, by visual estimation, is 60 to  65%.  2. Normal global left ventricular systolic function.  3. Impaired relaxation pattern of LV diastolicfilling.  4. Mild concentric left ventricular hypertrophy.  5. Mildly dilated left atrium.  6. Mild to moderate mitral annular calcification.  7. No cardiac source of embolism is identified. 2D AND M-MODE MEASUREMENTS (normal ranges within parentheses): Left Ventricle:          Normal IVSd (2D):      1.22 cm (0.7-1.1) LVPWd (2D):     1.33 cm (0.7-1.1) Aorta/LA:                  Normal LVIDd (2D):     3.32 cm (3.4-5.7) Aortic Root (2D): 3.20 cm (2.4-3.7) LVIDs (2D):     2.40 cmLeft Atrium (2D): 4.40 cm (1.9-4.0) LV FS (2D):     27.7 %   (>25%) LV EF (2D):     55.0 %   (>50%)                                   Right Ventricle:  RVd (2D):        8.88 cm LV DIASTOLIC  FUNCTION: MV Peak E: 1.04 m/s E/e' Ratio: 28.10 MV Peak A: 1.29 m/s Decel Time: 317 msec E/A Ratio: 0.81 SPECTRAL DOPPLER ANALYSIS (where applicable): Mitral Valve: MV Max Vel:   1.30 m/s MV P1/2 Time: 91.93 msec MV Mean Grad: 3.0 mmHg MV Area, PHT: 2.39 cm?? AorticValve: AoV Max Vel: 1.11 m/s AoV Peak PG: 4.9 mmHg AoV Mean PG:  4.0 mmHg LVOT Vmax: 0.96 m/s LVOT VTI: 0.279 m LVOT Diameter: 2.00 cm AoV Area, Vmax: 2.70 cm?? AoV Area, VTI: 2.86 cm?? AoV Area, Vmn: 2.40 cm?? Tricuspid Valve and PA/RV Systolic Pressure: TR Max Velocity: 1.51 m/s RA  Pressure: 5 mmHg RVSP/PASP: 14.1 mmHg Pulmonic Valve: PV Max Velocity: 0.88 m/s PV Max PG: 3.1 mmHg PV Mean PG:  PHYSICIAN INTERPRETATION: Left Ventricle: The left ventricular internal cavity size was normal. Mild concentric left ventricular hypertrophy. Global LV systolic function  was normal. Left ventricular ejection fraction, by visual estimation, is  60 to 65%. Spectral Doppler shows impaired relaxation pattern of LV   diastolic filling. RightVentricle: Normal right ventricular size, wall thickness, and  systolic function. Left Atrium: The left atrium is mildly dilated. Right Atrium: The right atrium is normal in size and structure. Pericardium: There is no evidence of pericardial effusion. Mitral Valve: Mild to moderate mitral annular calcification. No evidence  of mitral valve stenosis. Trace mitral valve regurgitation is seen. Tricuspid Valve: The tricuspid valve is normal. No tricuspid  regurgitation is visualized. The tricuspid regurgitant velocity is 1.51  m/s, and with an assumed right atrial pressure of 5 mmHg, the estimated  right ventricular systolic pressure is normal at 14.1 mmHg. Aortic Valve: The aortic valve is trileaflet and structurally normal,  with normal leaflet excursion; without any evidence of aortic stenosis or  insufficiency. Pulmonic Valve: The pulmonic valve is not well seen. Aorta: The aortic root is  normal in size and structure. Venous: The inferior vena cava was not well visualized.  [id number] Kathlyn Sacramento MD Electronically signed by [id number] Kathlyn Sacramento MD Signature Date/Time: 05/24/2014/5:38:00 PM  *** Final ***  IMPRESSION: .    Verified By: Mertie Clause. ARIDA, M.D., MD  Routine Chem:  15-AVW-97 00:38   Cholesterol, Serum  220  Triglycerides, Serum 164  HDL (INHOUSE) 51  VLDL Cholesterol Calculated 33  LDL Cholesterol Calculated  136 (Result(s) reported on 24 May 2014 at 03:40AM.)  Hemoglobin A1c ([facility])  8.9 (The American Diabetes Association recommends that a primary goal of therapy should be <7% and that physicians should reevaluate the treatment regimen in patients with HbA1c values consistently >8%.)  Glucose, Serum  212  BUN 13  Creatinine (comp) 0.86  Sodium, Serum 140  Potassium, Serum 3.9  Chloride, Serum 105  CO2, Serum 26  Calcium (Total), Serum 8.7  Osmolality (calc) 286  eGFR (African American) >60  eGFR (Non-African American) >60 (eGFR values <38m/min/1.73 m2 may be an indication of chronic kidney disease (CKD). Calculated eGFR, using the MRDR Study equation, is useful in  patients with stable renal function. The eGFR calculation will not be reliable in acutely ill patients when serum creatinine is changing rapidly. It is not useful in patients on dialysis. The eGFR calculation may not be applicable to patients at the low and high extremes of body sizes, pregnant women, and vegetarians.)  Anion Gap 9  Result Comment TROPONIN - RESULTS VERIFIED BY REPEAT TESTING.  - C/ CYNTHIA COOPER _0  05-24-14..Marland KitchenJO  - READ-BACK  PROCESS PERFORMED.  Result(s) reported on 24 May 2014 at 01:26AM.  Cardiac:  30-Nov-15 00:38   Troponin I  0.06 (0.00-0.05 0.05 ng/mL or less: NEGATIVE  Repeat testing in 3-6 hrs  if clinically indicated. >0.05 ng/mL: POTENTIAL  MYOCARDIAL INJURY. Repeat  testing in 3-6 hrs if  clinically indicated. NOTE: An increase or  decrease  of 30% or more on serial  testing suggests a  clinically important change)  Routine Hem:  30-Nov-15 00:38   WBC (CBC) 6.5  RBC (CBC) 4.40  Hemoglobin (CBC) 12.4  Hematocrit (CBC) 37.5  Platelet Count (CBC) 349 (Result(s) reported on 24 May 2014 at 12:56AM.)  MCV 85  MCH 28.1  MCHC 33.0  RDW 13.2   [place]:    30-Nov-15 08:45, US Carotid Doppler Bilateral  US Carotid Doppler Bilateral   REASON FOR EXAM:    TIA  COMMENTS:       PROCEDURE: [place]  - US CAROTID DOPPLER BILATERAL  - May 24 2014  8:45AM     CLINICAL DATA:  TIA, previous stroke. Hypertension, coronary artery  disease with stents, diabetes, previous right carotid endarterectomy  2013    EXAM:  BILATERAL CAROTID DUPLEX ULTRASOUND    TECHNIQUE:  Pearline Cables scale imaging, color Doppler and duplex ultrasound was  performed of bilateral carotid and vertebral arteries in the neck.  COMPARISON:  None available    REVIEW OF SYSTEMS:  Quantification of carotid stenosis is based on velocity parameters  that correlate the residual internal carotid diameter with  NASCET-based stenosis levels, using the diameter of the distal  internal carotid lumen as the denominator for stenosis measurement.    The following velocity measurements were obtained:    PEAK SYSTOLIC/END DIASTOLIC    RIGHT    ICA:                     113/41cm/sec  CCA:                     91/63WG/YKZ    SYSTOLIC ICA/CCA RATIO:  1.6    DIASTOLIC ICA/CCA RATIO: 2.7    ECA:                    133cm/sec    LEFT    ICA:                     297/91cm/sec    CCA:                     993/57SV/XBL    SYSTOLIC ICA/CCA RATIO:  2.8  DIASTOLIC ICA/CCA RATIO: 2.6    ECA:                     79cm/sec    FINDINGS:  RIGHT CAROTID ARTERY: Intimal thickening through the common carotid  artery. There is some mild eccentric partially calcified plaque in  the carotid bulb which is nonetheless capacious. Mild plaque extends  into the proximal ICA. Normal waveforms  and color Doppler signal. No  high-grade stenosis.    RIGHT VERTEBRAL ARTERY:  Normal flow direction and waveform.    LEFT CAROTID ARTERY: Mild intimal thickening in the common carotid  artery. Eccentric partially calcified plaque in the carotid bulb  extending into the proximal ICA resulting in at least mild stenosis.  Elevated peak systolic velocities are recorded in the proximal ICA  just distal to the plaque. Distal ICA is tortuous.  LEFT VERTEBRAL ARTERY: Normal flow direction and waveform.     IMPRESSION:  1. Left carotid bifurcation plaque resulting in 70-99% diameter  proximal ICA stenosis. Consider vascular surgical or  neurointerventional radiology consultation.  2. No evidence of hemodynamically significant residual or recurrent  stenosis post right carotid endarterectomy.      Electronically Signed    By: Arne Cleveland M.D.    On: 05/24/2014 09:14         Verified By: Kandis Cocking, M.D.,  MRI:    01-EOF-12 11:03, MRI Brain Without Contrast  MRI Brain Without Contrast   REASON FOR EXAM:    tia  COMMENTS:       PROCEDURE: MR  - MR BRAIN WO CONTRAST  - May 24 2014 11:03AM     CLINICAL DATA:  TIA. Slurred speech and left-sided facial droop  beginning last night.    EXAM:  MRI HEAD WITHOUT CONTRAST    TECHNIQUE:  Multiplanar, multiecho pulse sequences of the brain and surrounding  structures were obtained without intravenous contrast.  COMPARISON:  Head CT 05/24/2014    FINDINGS:  There is a small, approximately 1 cm focus of restricted diffusion  in the posterior right corona radiata consistent with acute infarct.  There is a punctate, subcentimeter focus of increased diffusion  weighted signal in the anterior left corona radiata with question of  minimally restricted diffusion on ADC map, although this is not  definite.    There is no evidence of intracranial hemorrhage, mass, midline  shift, or extra-axial fluid collection. Ventricles and  sulci are  within normal limits for age. Small foci of T2 hyperintensity in the  subcortical and deep cerebral white matter bilaterally are  nonspecific but compatible with mild chronic small vessel ischemic  disease, advanced for age. A punctate remote infarct is noted in the  left pons. Chronic lacunar infarcts are also noted in the basal  ganglia and thalami bilaterally and left corona radiata.    Prior right ocular lens extraction is noted. Paranasal sinuses and  mastoid air cells are clear. Major intracranial vascular flow voids  are preserved.     IMPRESSION:  1. Acute lacunar infarct in the right corona radiata.  2. Likely subacute lacunar infarct in the left corona radiata.  3. Mild chronic small vessel ischemic disease, advanced for age.  4. Chronic lacunar infarcts in the deep gray nuclei, left corona  radiata, and left pons.  Electronically Signed    By: Logan Bores    On: 05/24/2014 11:34         Verified By: Ferol Luz, M.D.,    Impression 1.  Acute right hemispheric CVA  History of CVA, status post right carotid endarterectomy in 2013. Carotid duplex shows a widely patent right CEA and critical stenosis of the left ICA.  She will need left carotid treatment but this represents and asymptomatic lesion.  Work up for a cardiac source for the right CVA should continue and cardiac clearance for surgery can also be accomplished.   2.  Diabetes mellitus. continue Humulin at home with sliding scale.  3.  Hypertension. The patient is on lisinopril 10 mg b.i.d. Blood pressure not adequately controlled. Plan: Add metoprolol and monitor blood pressure closely.  4.  Coronary artery disease, status post stent in 2005. The patient is not on aspirin, beta blocker, statin. Did not have any of followup lately. We will cycle cardiac enzymes because of mildly elevated troponin. Initiate aspirin, beta blocker, statin.  Echocardiogram. Cardiology consultation.   Plan Consult level 4    Electronic Signatures: Hortencia Pilar (MD)  (Signed 01-Dec-15 23:41)  Authored: General Aspect/Present Illness, Home Medications, Allergies, History and Physical Exam, Vital Signs, Labs, Radiology, Impression/Plan   Last Updated: 01-Dec-15 23:41 by Hortencia Pilar (MD)

## 2014-10-16 NOTE — H&P (Signed)
PATIENT NAME:  [name], [name] MR#:  [id number] DATE OF BIRTH:  [birth year]  DATE OF ADMISSION:  05/24/2014  REFERRING PHYSICIAN: Justin Sink. Dolores Frame, MD  PRIMARY CARE PHYSICIAN: None.   ADMITTING PHYSICIAN: Crissie Figures, MD   CHIEF COMPLAINT:  1.  Slurred speech.  2.  Facial droop on the left side with drooling, which was noticed around 10:30 p.m.    HISTORY OF PRESENT ILLNESS: A 59 year old Caucasian female with a past medical history significant for hypertension, history of coronary artery disease status post stent in 2005, and history of CVA in 2013, status post right carotid endarterectomy, history of diabetes mellitus on insulin, presents to the Emergency Room with complaints of sudden onset of slurred speech associated with some left facial droop with drooling, which was noticed around 10:30 p.m. by the patient. The patient noticed these symptoms around 10:30 p.m., following which she called her daughter who lives nearby and these symptoms where also appreciated by her daughter; hence, they decided to come to the Emergency Room. The patient also checked her blood pressure room before coming to the hospital and according to the patient it very elevated with a blood pressure of 199/114. By the time the patient was brought to the Emergency Room by her daughter her speech and facial droop resolved completely, and by the time the patient was evaluated by the ED physician, the patient did not have any residual focal deficit. In the Emergency Room, on arrival the patient was noted to have a blood pressure of 164/101. The patient underwent further workup, which included blood tests which showed a blood sugar of 212 and a mildly elevated troponin of 0.06. CT head was negative for any acute changes or bleeds, but old chronic lacunar infarct was seen. Chest x-ray was also unremarkable. The patient remained stable except for some headache while in the Emergency Room and did not have any further neurological  events. The patient received aspirin and subcutaneous Lovenox in the Emergency Room and the hospitalist team was consulted for further evaluation and management. The patient also received some morphine for the headache, following which her headache is under control now. The patient did not have any did not have any chest pain, shortness of breath. No dizziness. No loss of consciousness. No recent illnesses. No fever. No cough. No nausea. No vomiting or diarrhea. She does mention that she may have some vague chest discomfort yesterday lasting a few minutes, but she usually has this on and off chest pain episodes, which she did not think about it. The patient currently denies any chest pain. No shortness of breath. States that she is feeling okay.    PAST MEDICAL HISTORY:  1.  Hypertension.  2.  Coronary artery disease, status post stent in 2005.  3.  History of CVA in 2013.  4.  Status post right carotid endarterectomy in 2013.  5.  Diabetes mellitus, on insulin sliding scale.   PAST SURGICAL HISTORY:  1.  Status post right carotid endarterectomy in 2013.  2.  Carpal tunnel syndrome release surgery.  3.  Foot surgery for some injury.   ALLERGIES: PENICILLIN.   HOME MEDICATIONS:  1.  Humulin sliding scale insulin with meals.  2.  Lisinopril 10 mg b.i.d.    SOCIAL HISTORY: She is married. Her husband lives in [place]. She lives alone at home. She is waiting for her disability. No history of any smoking, alcohol, or substance abuse.   FAMILY HISTORY: Significant for mom with multiple strokes.  REVIEW OF SYSTEMS:  CONSTITUTIONAL: Negative for fever, fatigue, or generalized weakness. No abnormal weight gain or weight loss.  EYES: Negative for blurred vision or double vision. No pain. No redness. No inflammation.  EARS, NOSE, AND THROAT: Negative for tinnitus, ear pain, hearing loss, epistaxis, nasal discharge, or difficulty swallowing.  RESPIRATORY: Negative for cough, wheezing, hemoptysis,  dyspnea, painful respirations.  CARDIOVASCULAR: Negative for chest pain, shortness of breath, palpitations, dizziness, or syncope. No pedal edema.  GASTROINTESTINAL: Negative for nausea, vomiting, diarrhea, abdominal pain, hematemesis, melena, GERD symptoms, or rectal bleeding.  GENITOURINARY: Negative for dysuria, hematuria, frequency, or urgency.  ENDOCRINE: Negative for polyuria, polydipsia, or nocturia. No heat or cold intolerance.  HEMATOLOGIC: Negative for anemia, easy bruising, bleeding, or swollen glands.  INTEGUMENTARY: Negative for acne, skin lesions, or rash.  MUSCULOSKELETAL: Negative for any arthritis, joint pain or swelling.  NEUROLOGICAL: As noted in the history of present illness, the patient noted sudden onset of slurred speech associated with left-sided facial droop with drooling around 10:30 p.m. which lasted for an hour and resolved completely on its own. History of CVA in 2013, following which she underwent a right carotid endarterectomy.   PSYCHIATRIC: Negative for anxiety, insomnia, or depression.   PHYSICAL EXAMINATION:  VITAL SIGNS: On arrival to the Emergency Room, temperature 97.9 degrees Fahrenheit, pulse rate 104 per minute, respirations 17 per minute, blood pressure 164/101, O2 saturation 95% on room air.  GENERAL: Well-developed, well-nourished, pleasant lady, comfortably lying in the bed. Alert and oriented x 3, in no acute distress.  HEAD: Atraumatic, normocephalic.  EYES: Pupils are equal, reactive to light and accommodation. No nystagmus. No conjunctival pallor. No scleral icterus. Extraocular movements are intact.  NOSE: No nasal lesions. No drainage.  EARS: No drainage. No external lesions.  ORAL CAVITY: No mucosal lesions. No exudates. No hemorrhage. No masses.  NECK: Supple. No JVD. No thyromegaly. No carotid bruit. Range of motion of neck within normal limits.  RESPIRATORY: Good respiratory effort. Clear to auscultation bilaterally. Bilateral vesicular  breath sounds present. No rales or rhonchi.  CARDIOVASCULAR: Hearts sounds S1, S2 regular. No murmurs, clicks, or gallops appreciated. Pulses are equal at carotid, femoral, and pedal pulses. No peripheral edema.  GASTROINTESTINAL: Abdomen is soft and nontender. No hepatosplenomegaly. Bowel sounds present and equal in all 4 quadrants. No tenderness. No guarding. No rigidity.  GENITOURINARY: Deferred.  MUSCULOSKELETAL: Gait not tested. Movement of all joints normal. Range of motion and strength are adequate and tone equal bilaterally.   SKIN: Examination within normal limits.  LYMPHATIC: No cervical lymphadenopathy.  VASCULAR: Good dorsalis pedis and posterior tibial pulses.  NEUROLOGICAL: Alert, awake, and oriented x 3. Cranial nerves II through XII grossly intact. No facial asymmetry. DTRs 2+ bilaterally and symmetrical in both upper and lower extremities. Motor strength 5/5 in both upper and lower extremities.  PSYCHIATRIC: Judgment and insight are adequate. Alert and oriented x 3. Memory and mood within normal limits.   LABORATORY DATA: Serum glucose 212, BUN 13, creatinine 0.86, serum sodium 140, potassium 3.9, chloride 105, bicarbonate 26, total calcium 8.7, total protein 7.1, albumin 3.4, total bilirubin 0.3, alkaline phosphatase 102, AST 22, ALT 21, troponin 0.06. WBC 6.5, hemoglobin 12.4, hematocrit 37.5, platelet count 349,000.   IMAGING STUDIES: CT of the head, noncontrast study: Impression:  1.  No acute abnormality seen on CT.  2.  Scattered small vessel ischemic microangiopathy and small chronic lacunar infarct at the left caudate.   Chest x-ray: Impression: No active disease.   ASSESSMENT AND  PLAN: A 59 year old Caucasian female with a past medical history significant for hypertension, coronary artery disease status post stent in 2005, history of CVA in 2013, status post right carotid endarterectomy, and diabetes mellitus on insulin, presents to the Emergency Room with acute onset of  impaired speech with facial droop and drooling on the left side, which lasted for about an hour.   1.  Transient episode slurred speech associated with left-sided facial droop and drooling lasting for one hour, which resolved completely within an hour. Transient ischemic attack. History of CVA, status post right carotid endarterectomy in 2013. Plan: Admit to a telemetry. Neurologic checks. Start aspirin, Lovenox, statin. Order MRI brain, echocardiogram, carotid Doppler, and consult neurology and physical therapy consult.  2.  Elevated troponin, likely demand ischemia secondary to high blood pressure. No chest pain. EKG: No acute changes. We will rule out acute coronary syndrome. Plan: Admit to telemetry, aspirin, beta blockers, statin. Cycle cardiac enzymes. Order echocardiogram. Cardiology consultation.  3.  Hypertension. The patient is on lisinopril 10 mg b.i.d. Blood pressure not adequately controlled. Plan: Add metoprolol and monitor blood pressure closely.  4.  History of coronary artery disease, status post stent in 2005. The patient is not on aspirin, beta blocker, statin. Did not have any of followup lately. We will cycle cardiac enzymes because of mildly elevated troponin. Initiate aspirin, beta blocker, statin. Echocardiogram. Cardiology consultation.  5.  Diabetes mellitus. She takes Humulin at home with sliding scale. No recent evaluation. Continue sliding scale insulin for now. Check A1c, fasting lipids, urine microbiology in the morning.  6.  Deep vein thrombosis prophylaxis with subcutaneous Lovenox.  7.  Gastrointestinal prophylaxis with proton pump inhibitor.   CODE STATUS: FULL CODE.   TIME SPENT: 55 minutes.    ____________________________ Crissie Figures, MD enr:ts D: 05/24/2014 03:30:59 ET T: 05/24/2014 04:17:41 ET JOB#: [id number]  cc: Crissie Figures, MD, <Dictator> Crissie Figures MD ELECTRONICALLY SIGNED 05/24/2014 17:57

## 2014-10-16 NOTE — Consult Note (Signed)
Brief Consult Note: Diagnosis: critical stenosis of the left ICA, Acute right hemispheric CVA.   Patient was seen by consultant.   Orders entered.   Comments: left ICA is asymptomsatic and can be worked up as an out patient.  Electronic Signatures: Levora Dredge (MD)  (Signed 01-Dec-15 15:15)  Authored: Brief Consult Note   Last Updated: 01-Dec-15 15:15 by Levora Dredge (MD)

## 2014-10-16 NOTE — Consult Note (Signed)
General Aspect Primary Cardiologist:  Einar Crow, MD (last seen 08/2012) _____________  Pt Profile:  59 y/o female with a h/o CAD s/p prior LAD PCI and embolic CVA in 5597, who presented with CVA and has been found to have an elevated troponin of 0.06. _____________   Past Medical History ??? CAD (coronary artery disease)    a. PCI [place] 2004. b. Angina - balloon angioplasty to LAD 04/21/12 (vessel too small for stent). ??? Refusal of blood transfusions as patient is Jehovah's Witness  ??? HLD (hyperlipidemia)    a. takes Lipitor daily ??? Asthma  ??? 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 visual changes ??? Chronic lower back pain  ??? Anxiety    a. with claustrophobia ??? Carotid stenosis    a. 11/2011 critical R, ~30% L by angiogram;  b. 04/2012 s/p R CEA. ??? Hypertension, accelerated  ??? Peripheral neuropathy  ??? Embolic stroke    a. 09/1636 MRI: mult small b/l infarcts c/w emboli;  b. 11/2011 MRA neck: high grade RICA stenosis;  c. 6/20013 TEE: EF 55-60% with mild LVH, severe MAC, < 1 cm calc mobile structure on the MV annulus, ? 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 Thought was RICA stenosis did not explain the b/l small strokes; concern for emboli from mobile MV struct;  d. 01/2012 Event Mon:  no AFib ??? History of kidney stones  ??? Diabetes mellitus type 2, uncontrolled  ??? Cataract    left ??? Depression    a. occasionally takes st john's wart ??? Arthritis    "right hand; maybe just starting in my left hand" (04/21/2012)   Past Surgical History ??? Kidney stone surgery     several times ??? Carpal tunnel release  ~ 2005   bilateral ??? Cataract extraction w/ intraocular lens implant  2012   right ??? Tee without cardioversion  12/14/2011   Procedure: TRANSESOPHAGEAL ECHOCARDIOGRAM (TEE);  Surgeon: Larey Dresser, MD;  Location: [facility] ENDOSCOPY;  Service: Cardiovascular;  Laterality: N/A; ??? Ankle surgery  2005   after fall down stairs, left with  screws and plate in place ??? Holter monitor  2013   WNL ??? US echocardiography  2002   normal LV fxn, mild LVH, borderline L atrial size ??? Lithotripsy   ??? Coronary angioplasty with stent placement  2002   x2 ??? Cardiac catheterization  2004   WNL, no significant coronary disease ??? Cardiac catheterization  12/19/11 ??? Coronary angioplasty  04/21/2012   95% stenosis mid LAD, vessel not large enough for stent, rec plavix/ASA for 1 mo ??? Endarterectomy  05/09/2012   Procedure: ENDARTERECTOMY CAROTID;  Surgeon: Serafina Mitchell, MD;  Location: [facility] OR;  Service: Vascular;  Laterality: Right;  Right carotd endarterectomy with bovine patch angioplasty   Present Illness 59 y/o female with the above complex problem list.  She has a prior h/o CAD s/p PCI in 2004 in [place] with subsequent PTCA to a small LAD in 03/2012.  She is also s/p embolic CVA in 09/5362.  She was found @ the time to have a mobile calcified structure on her mitral valve, which may have represented MAC vs old area of endocarditis.  It was felt to be a potential source for emboli and thus she was placed on plavix.  She was also found to have severe RICA stenosis and subsequently underwent R CEA in 04/2012.  She did wear an event monitor following CVA and this did not show any evidence  of afib.  She has not been seen by Dr. Aundra Dubin since 08/2012, which she attributes to not having health insurance.  She came off of both ASA and plavix and is currently only on lisinopril and insulin.  Over the past year, she has put on about 30 lbs, which she attributes to an especially cold winter last year, limiting her ability to exercise.  She lives in [place] on a [place] farm and is able to carry out her chores w/o significant limitations.  She does occasionaly experience a dull chest discomfort, which she says she's had intermittently ever since her stents were placed in 2004.  This typically occurs 3-4x/yr, with exertion, lasting a few mins, and  resolving spontaneously.  There are no associated Ss and discomfort does not usually stop her from doing what she is doing.    Her entire family visited for Thanksgiving (dtr and son live locally but husband lives in [place], working in Architect, and helping to take care of her parents there).  On Friday AM, she noted mild chest discomfort w/o associated Ss.  She went about her morning chores and thinks discomfort resolved w/in an hour.  Her family left on Saturday.  Yesterday morning she awoke with a headache and found her BP to be markedly elevated.  She took her lisinopril and went about her day.  Yesterday evening, while watching TV, she was talking to her dog and noted slurring of her speech.  She looked in the mirror and noted left facial drooping and drooling.  She called her dtr, who then came to her home and called EMS.  She was taken [facility] ED.  Ss resolved upon arrival to ED.  Head CT showed old lacunar infarcts w/o acute fidings.  BP was elevated and troponin was found to be minimally elevated @ 0.06.  She was admitted by medicine and seen by neuro.  Carotid U/S has shown proximal LICA stenosis of 16-10% with patent RICA.  MRI shows acute lacunar infarct in R corona radiata with probable subacute lacunar infarct in L corona radiata and chronic L corona lacunar infarct.  Echo is pending.  She has not had any c/p since Friday 11/27 and is currently asymptomatic.   _____________   Family History ??? Heart disease Mother    valve problems ??? Heart disease Sister    valve problems ??? Heart disease Brother  ??? Stroke Mother  ??? Stroke Brother 35 ??? Diabetes Father  ??? Cancer Neg Hx  ??? Hypertension Father  ??? Hypertension Mother  _____________  Social History ??? Marital Status: Married    ??? Smoking status: Former Smoker -- 1.00 packs/day for 3 years   Types: Cigarettes   Quit date: 06/26/1975 ??? Smokeless tobacco: Never Used ??? Alcohol Use: No    Comment: none  since 11/2011 ??? Drug Use: No ??? Sexual Activity: Yes   Birth Control/ Protection: Post-menopausal  Social History Narrative  Caffeine: 2 cups/day coffee Lives alone, husband in [place].  4 children nearby, son to live with her. 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  _____________   Physical Exam:  GEN well developed, well nourished, no acute distress   HEENT pink conjunctivae, moist oral mucosa   NECK supple  no bruits/jvd.  R CEA scar.   RESP normal resp effort  clear BS   CARD Regular rate and rhythm  Normal, S1, S2  No murmur   ABD denies tenderness  soft  normal BS   EXTR negative cyanosis/clubbing, negative edema   SKIN normal to palpation   NEURO cranial nerves intact, motor/sensory function intact   PSYCH alert, A+O to time, place, person   Review of Systems:  General: No Complaints   Skin: No Complaints   ENT: No Complaints   Eyes: No Complaints   Neck: No Complaints   Respiratory: No Complaints   Cardiovascular: occasional chest discomfort as outlined above.   Gastrointestinal: No Complaints   Genitourinary: No Complaints   Vascular: No Complaints   Musculoskeletal: No Complaints   Neurologic: Headache yesterday morning.  L facial droop, drooling, and slurred speech last night.   Hematologic: No Complaints   Endocrine: No Complaints   Psychiatric: No Complaints   Review of Systems: All other systems were reviewed and found to be negative   Medications/Allergies Reviewed Medications/Allergies reviewed   Home Medications: Medication Instructions Status  lisinopril 10 mg oral tablet 10 milligram(s) orally 2 times a day Active  HumuLIN N unit(s) subcutaneous per sliding scale  Active   Lab Results:  Hepatic:  30-Nov-15 00:38   Bilirubin, Total 0.3  Alkaline Phosphatase 102  (46-116 NOTE: New Reference Range 01/12/14)  SGPT (ALT) 21 (14-63 NOTE: New Reference Range 01/12/14)  SGOT (AST) 22  Total Protein, Serum 7.1  Albumin, Serum 3.4  Routine Chem:  30-Nov-15 00:38   Cholesterol, Serum  220  Triglycerides, Serum 164  HDL (INHOUSE) 51  VLDL Cholesterol Calculated 33  LDL Cholesterol Calculated  136 (Result(s) reported on 24 May 2014 at 03:40AM.)  Hemoglobin A1c ([facility])  8.9 (The American Diabetes Association recommends that a primary goal of therapy should be <7% and that physicians should reevaluate the treatment regimen in patients with HbA1c values consistently >8%.)  Glucose, Serum  212  BUN 13  Creatinine (comp) 0.86  Sodium, Serum 140  Potassium, Serum 3.9  Chloride, Serum 105  CO2, Serum 26  Calcium (Total), Serum 8.7  Osmolality (calc) 286  eGFR (African American) >60  eGFR (Non-African American) >60 (eGFR values <60m/min/1.73 m2 may be an indication of chronic kidney disease (CKD). Calculated eGFR, using the MRDR Study equation, is useful in  patients with stable renal function. The eGFR calculation will not be reliable in acutely ill patients when serum creatinine is changing rapidly. It is not useful in patients on dialysis. The eGFR calculation may not be applicable to patients at the low and high extremes of body sizes, pregnant women, and vegetarians.)  Anion Gap 9  Result Comment TROPONIN - RESULTS VERIFIED BY REPEAT TESTING.  - C/ CYNTHIA COOPER _0  05-24-14..Marland KitchenJO  - READ-BACK PROCESS PERFORMED.  Result(s) reported on 24 May 2014 at 01:26AM.  Cardiac:  30-Nov-15 00:38   Troponin I  0.06 (0.00-0.05 0.05 ng/mL or less: NEGATIVE  Repeat testing in 3-6 hrs  if clinically indicated. >0.05 ng/mL: POTENTIAL  MYOCARDIAL INJURY. Repeat  testing in 3-6 hrs if  clinically indicated. NOTE: An increase or decrease  of 30% or more on serial  testing suggests a  clinically important change)  Routine Hem:  30-Nov-15 00:38   WBC  (CBC) 6.5  RBC (CBC) 4.40  Hemoglobin (CBC) 12.4  Hematocrit (CBC) 37.5  Platelet Count (CBC) 349 (Result(s) reported on 24 May 2014 at 12:56AM.)  MCV 85  MCH 28.1  MCHC 33.0  RDW 13.2   EKG:  EKG Interp. by me  rsr, 97, inf q's, delayed r  progression. Inf q's not present on 03/2012 ecg's.    Pollen: Unknown  Penicillin: Swelling, Rash  Vital Signs/Nurse's Notes: **Vital Signs.:   30-Nov-15 12:12  Vital Signs Type Admission  Temperature Temperature (F) 98.3  Celsius 36.8  Temperature Source oral  Pulse Pulse 76  Respirations Respirations 14  Systolic BP Systolic BP 507  Diastolic BP (mmHg) Diastolic BP (mmHg) 573  Mean BP 132  Pulse Ox % Pulse Ox % 96  Pulse Ox Activity Level  At rest  Oxygen Delivery Room Air/ 21 %    Impression 1.  Acute Lacunar Infarct of the R Corona with subacute lacunar infarct of the L corona/L Carotid Stenosis:  Neuro following.  U/S with finding of 22-56% LICA stenosis, patent RICA,  S/P prior RICA CEA.  Echo pending to re-evaluate MV calcification, which was felt to be possible source of emboli in the past.   --Consider TEE (will d/w Dr. Fletcher Anon). --Vascular surgery eval. --Follow tele for any evidence of afib (not previously appreciated on 2013 30 day event monitor). --If no afib on tele/LAA thrombus on TEE, consider EP referral for Linq placment. --Cont ASA, statin.  2.  CAD with mild troponin elevation:  S/P prior stenting in 2004 and LAD PTCA in 2013.  She does have occasional c/p and had some on Friday morning.  C/P has been intermittently present since 2004 and generally does not limit activity.  Troponin currently only minimally elevated on one measure - 0.06.  No active c/p.  Suspect elevation may be 2/2 CVA. --Cycle troponins. --F/U EF on echo. --Will plan on myoview to risk stratify provided LV fxn nl and troponin trend flat.  Timing of MV partially dependent upon plans for mgmt of CEA.  3.  HTN:  BP had been trending up @ home on  lisinopril only. BB added here. BP still elevated.   --Follow and titrate meds as needed.   --Will switch metoprolol to carvedilol for better BP lowering.  4.  HL:   --Cont statin.  5.  DM:   --Per IM.   Electronic Signatures for Addendum Section:  Kathlyn Sacramento (MD) (Signed Addendum [phone] 18:23)  The patient was seen and examined. Agree with the above. She presented with a stroke. Troponin mildly elevated. She reports chronic chest pain. Echo showed no cardiac source of embolism. I don't think a repeat TEE is needed (she had one in 2014). Mitral calcifications seem moderate.  Recommend a [employer].   Electronic Signatures: Kathlyn Sacramento (MD)  (Signed [phone] 18:23)  Co-Signer: General Aspect/Present Illness, Home Medications, Allergies Rogelia Mire (NP)  (Signed [phone] 15:38)  Authored: General Aspect/Present Illness, History and Physical Exam, Review of System, Home Medications, Labs, EKG , Allergies, Vital Signs/Nurse's Notes, Impression/Plan   Last Updated: 30-Nov-15 18:23 by Kathlyn Sacramento (MD)

## 2014-10-16 NOTE — Discharge Summary (Signed)
Dates of Admission and Diagnosis:  Date of Admission 24-May-2014   Date of Discharge 25-May-2014   Admitting Diagnosis stroke   Final Diagnosis Acute Stroke left carotid artery atherosclerosis.- 70-99% Hypertension Diabetes mellitus Hyperlipidemia History of coronary artery disease Chronic heart failure- relaxation abnormality.    Chief Complaint/History of Present Illness A 59 year old Caucasian female with a past medical history significant for hypertension, history of coronary artery disease status post stent in 2005, and history of CVA in 2013, status post right carotid endarterectomy, history of diabetes mellitus on insulin, presents to the Emergency Room with complaints of sudden onset of slurred speech associated with some left facial droop with drooling, which was noticed around 10:30 p.m. by the patient. The patient noticed these symptoms around 10:30 p.m., following which she called her daughter who lives nearby and these symptoms where also appreciated by her daughter; hence, they decided to come to the Emergency Room. The patient also checked her blood pressure room before coming to the hospital and according to the patient it very elevated with a blood pressure of 199/114. By the time the patient was brought to the Emergency Room by her daughter her speech and facial droop resolved completely, and by the time the patient was evaluated by the ED physician, the patient did not have any residual focal deficit. In the Emergency Room, on arrival the patient was noted to have a blood pressure of 164/101. The patient underwent further workup, which included blood tests which showed a blood sugar of 212 and a mildly elevated troponin of 0.06. CT head was negative for any acute changes or bleeds, but old chronic lacunar infarct was seen. Chest x-ray was also unremarkable. The patient remained stable except for some headache while in the Emergency Room and did not have any further neurological  events. The patient received aspirin and subcutaneous Lovenox in the Emergency Room and the hospitalist team was consulted for further evaluation and management. The patient also received some morphine for the headache, following which her headache is under control now.   Chief Complaint/History of Present Illness cont'd The patient did not have any did not have any chest pain, shortness of breath. No dizziness. No loss of consciousness. No recent illnesses. No fever. No cough. No nausea. No vomiting or diarrhea. She does mention that she may have some vague chest discomfort yesterday lasting a few minutes, but she usually has this on and off chest pain episodes, which she did not think about it. The patient currently denies any chest pain. No shortness of breath. States that she is feeling okay.   Allergies:  Pollen: Unknown  Penicillin: Swelling, Rash  Pertinent Past History:  Pertinent Past History 1.  Hypertension.  2.  Coronary artery disease, status post stent in 2005.  3.  History of CVA in 2013.  4.  Status post right carotid endarterectomy in 2013.  5.  Diabetes mellitus, on insulin sliding scale.   Hospital Course:  Hospital Course * CVA MRI +, carotid doppler have blockage in left carotid artery. reviewed echo report.- Ch diastolic- relaxation problem.  Lipid panel- high-  statins.   Was not taking any anti platelets agents- started AsA.  * Carotid artery atherosclerosis    Called vascular consult    s/p right side enarterectomy.  * Elevated troponon  likely due to atroke, no acute CAD  * Hx of CAD   asa, BB, statin  * Htn   lisinopril, Permissible Htn in acute stroke up to systolic 180.  on coreg, now still high after 48 hrs- so added amlodipin and increase lisinopril dose.  * DM   ISS.   HBA1c high- added metformin.   Condition on Discharge Stable   Code Status:  Code Status Full Code   DISCHARGE INSTRUCTIONS HOME MEDS:  Medication  Reconciliation: Patient's Home Medications at Discharge:     Medication Instructions  humulin n  unit(s) subcutaneous per sliding scale    lisinopril 20 mg oral tablet  1 tab(s) orally 2 times a day   metformin 500 mg oral tablet  1 tab(s) orally 2 times a day (with meals)   atorvastatin 20 mg oral tablet  1 tab(s) orally once a day   aspirin 81 mg oral delayed release tablet  1 tab(s) orally once a day   carvedilol 6.25 mg oral tablet  1 tab(s) orally 2 times a day   amlodipine 5 mg oral tablet  1 tab(s) orally once a day     Physician's Instructions:  Diet Low Sodium  Low Fat, Low Cholesterol  Carbohydrate Controlled (ADA) Diet   Activity Limitations As tolerated   Return to Work Not Applicable   Time frame for Follow Up Appointment 1-2 weeks  PMD.   Time frame for Follow Up Appointment 1-2 weeks  vascular     Schnier, Gregory(Consultant): [facility] [address] [place], [place] [address], [phone]  TIME SPENT:  Total Time: Greater than 30 minutes   Electronic Signatures: Altamese Dilling (MD)  (Signed 04-Dec-15 21:52)  Authored: ADMISSION DATE AND DIAGNOSIS, CHIEF COMPLAINT/HPI, Allergies, PERTINENT PAST HISTORY, HOSPITAL COURSE, DISCHARGE INSTRUCTIONS HOME MEDS, PATIENT INSTRUCTIONS, Follow Up Physician, TIME SPENT   Last Updated: 04-Dec-15 21:52 by Altamese Dilling (MD)

## 2014-10-16 NOTE — Consult Note (Signed)
PATIENT NAME:  [name], [name] MR#:  [id number] DATE OF BIRTH:  1955/07/18  DATE OF CONSULTATION:  05/24/2014  REFERRING PHYSICIAN:   CONSULTING PHYSICIAN:  Pauletta Browns, MD  REASON FOR CONSULTATION: Speech and left facial droop that has resolved.   HISTORY OF PRESENT ILLNESS: A 59 year old female with past medical history of hypertension, history of coronary artery disease status post stenting in 2005, history of questionable stroke in 2013 that left the patient confused for a brief period of time but no significant weakness on one side of the body compared to the other, status post carotid endarterectomy on the right side presented with transient episode of slurry speech and a left facial droop that was noticed by family members. On arrival to Emergency Department, the patient's symptoms are back to baseline. No facial droop, nondysarthric speech has been noted.   PAST MEDICAL HISTORY: Hypertension, coronary artery disease, questionable stroke in 2013, status post right carotid endarterectomy in 2013 as well, diabetes (is on insulin).   PAST SURGICAL HISTORY: Status post right carotid endarterectomy, carpal tunnel release surgery.   ALLERGIES: PENICILLIN.   MEDICATIONS: At home, the patient is not on any antiplatelet therapy.   SOCIAL HISTORY: She is married and lives in [place].   FAMILY HISTORY: Significant for multiple strokes.   REVIEW OF SYSTEMS: Negative for fever. Negative fatigue. Negative chest pain. No abdominal pain. Currently no weakness on one side of the body compared to the other. No anxiety or depression. No heat or cold intolerance.   RADIOLOGICAL DATA: Imaging has been reviewed. CAT scan of the head: No acute intracranial abnormality. MRI shows right corona radiata infarct consistent with the patient's initial symptoms.   PHYSICAL EXAMINATION:  VITAL SIGNS: Include a temperature of 98.3, pulse 76, respirations 14, NEUROLOGIC: Currently, speech appears to be fluent.  The patient alert, awake, oriented to place, location and the reason why she is in the hospital. Facial sensation intact. Facial motor intact. Tongue is midline. Uvula elevates symmetrically. Shoulder shrug is intact. Motor appears to be 5/5 bilateral upper and lower extremities. Sensation intact to light touch and temperature. Coordination: Finger-to-nose intact. Reflexes 1+ symmetrical, probably in the setting of diabetic neuropathy. Gait not assessed.   IMPRESSION: A 59 year old female with past medical history of hypertension, questionable stroke in 2013, status post right carotid endarterectomy in 2013, presents with a transient episode of left facial droop and dysarthria that has improved. The patient is currently back to baseline. Imaging shows right corona radiata infarct consistent with small vessel disease, likely in the setting of hypertension and diabetes.   PLAN: The patient was not on any antiplatelet at home. The patient was started on baby aspirin of 81 mg and will continue that as an outpatient. The patient should be on a statin. A 2-D echocardiogram, carotid Dopplers. After the work-up is complete, I think the patient can be discharged home.   This case was discussed with the patient's family at bedside and the patient.   Thank you, it was a pleasure seeing this patient.     ____________________________ Pauletta Browns, MD [phone] D: 05/24/2014 13:01:15 ET T: 05/24/2014 13:12:03 ET JOB#: [id number]  cc: Pauletta Browns, MD, <Dictator> Pauletta Browns MD ELECTRONICALLY SIGNED 06/23/2014 13:48

## 2015-04-26 HISTORY — PX: EYE SURGERY: SHX253

## 2015-04-26 HISTORY — PX: PARS PLANA VITRECTOMY: SHX2166

## 2015-05-03 ENCOUNTER — Telehealth: Payer: Self-pay

## 2015-05-03 NOTE — Telephone Encounter (Signed)
Noted  

## 2015-05-03 NOTE — Telephone Encounter (Signed)
Pt has appt to establish with Dr Sharen Hones on 05/17/15; pt is going to have eye surgery due to bleeding in eye; pts BP was elevated 210/185 earlier; now BP 165/85. Pt is blind and cannot go anywhere by car. Spoke with Almira Coaster RN team lead and was advised to tell pt to go to ED or UC now for evaluation and BP med until can be seen by Dr Reece Agar; explained 210/185 is dangerously high. Pt cannot go back to previous PCP due to being discharge for not following recommendations. Pt agreed to call 911 to go for eval. FYI to Dr Reece Agar.

## 2015-05-09 NOTE — Telephone Encounter (Signed)
Pt did not go to ED on 05/03/15; today BP 128/81 but pt is afraid when has eye surgery(bleeding behind eye) that BP will go up due to white coat syndrome; pt has not had lisinopril since mid Oct. Pt said ED told her would not prescribe med for BP with 128/81 BP. Dr Reece Agar will see pt on 11/15/*16 as 30 min acute appt. Pt scheduled appt 05/10/15 at 11 AM. FYI to Dr Reece Agar.

## 2015-05-10 ENCOUNTER — Encounter: Payer: Self-pay | Admitting: Internal Medicine

## 2015-05-10 ENCOUNTER — Encounter: Payer: Self-pay | Admitting: Family Medicine

## 2015-05-10 ENCOUNTER — Ambulatory Visit (INDEPENDENT_AMBULATORY_CARE_PROVIDER_SITE_OTHER): Payer: Managed Care, Other (non HMO) | Admitting: Family Medicine

## 2015-05-10 VITALS — BP 160/110 | HR 84 | Temp 98.4°F | Wt 194.0 lb

## 2015-05-10 DIAGNOSIS — E113599 Type 2 diabetes mellitus with proliferative diabetic retinopathy without macular edema, unspecified eye: Secondary | ICD-10-CM | POA: Diagnosis not present

## 2015-05-10 DIAGNOSIS — E1165 Type 2 diabetes mellitus with hyperglycemia: Secondary | ICD-10-CM

## 2015-05-10 DIAGNOSIS — IMO0002 Reserved for concepts with insufficient information to code with codable children: Secondary | ICD-10-CM

## 2015-05-10 DIAGNOSIS — I10 Essential (primary) hypertension: Secondary | ICD-10-CM | POA: Diagnosis not present

## 2015-05-10 DIAGNOSIS — I2510 Atherosclerotic heart disease of native coronary artery without angina pectoris: Secondary | ICD-10-CM

## 2015-05-10 DIAGNOSIS — E1159 Type 2 diabetes mellitus with other circulatory complications: Secondary | ICD-10-CM

## 2015-05-10 DIAGNOSIS — E119 Type 2 diabetes mellitus without complications: Secondary | ICD-10-CM | POA: Insufficient documentation

## 2015-05-10 DIAGNOSIS — I3481 Nonrheumatic mitral (valve) annulus calcification: Secondary | ICD-10-CM

## 2015-05-10 DIAGNOSIS — Z794 Long term (current) use of insulin: Secondary | ICD-10-CM

## 2015-05-10 DIAGNOSIS — E785 Hyperlipidemia, unspecified: Secondary | ICD-10-CM

## 2015-05-10 DIAGNOSIS — I6523 Occlusion and stenosis of bilateral carotid arteries: Secondary | ICD-10-CM

## 2015-05-10 DIAGNOSIS — I059 Rheumatic mitral valve disease, unspecified: Secondary | ICD-10-CM

## 2015-05-10 DIAGNOSIS — I6310 Cerebral infarction due to embolism of unspecified precerebral artery: Secondary | ICD-10-CM

## 2015-05-10 HISTORY — DX: Reserved for concepts with insufficient information to code with codable children: IMO0002

## 2015-05-10 HISTORY — DX: Type 2 diabetes mellitus with hyperglycemia: E11.65

## 2015-05-10 LAB — COMPREHENSIVE METABOLIC PANEL
ALBUMIN: 4.2 g/dL (ref 3.5–5.2)
ALK PHOS: 71 U/L (ref 39–117)
ALT: 16 U/L (ref 0–35)
AST: 16 U/L (ref 0–37)
BILIRUBIN TOTAL: 0.7 mg/dL (ref 0.2–1.2)
BUN: 20 mg/dL (ref 6–23)
CO2: 28 mEq/L (ref 19–32)
CREATININE: 0.88 mg/dL (ref 0.40–1.20)
Calcium: 9.9 mg/dL (ref 8.4–10.5)
Chloride: 104 mEq/L (ref 96–112)
GFR: 69.73 mL/min (ref >60.00)
GLUCOSE: 244 mg/dL — AB (ref 70–99)
POTASSIUM: 4.6 meq/L (ref 3.5–5.1)
SODIUM: 139 meq/L (ref 135–145)
TOTAL PROTEIN: 7.4 g/dL (ref 6.0–8.3)

## 2015-05-10 LAB — LIPID PANEL
CHOLESTEROL: 198 mg/dL (ref 0–200)
HDL: 53.7 mg/dL (ref >39.00)
LDL CALC: 128 mg/dL — AB (ref 0–99)
NonHDL: 144.76
TRIGLYCERIDES: 86 mg/dL (ref 0.0–149.0)
Total CHOL/HDL Ratio: 4
VLDL: 17.2 mg/dL (ref 0.0–40.0)

## 2015-05-10 LAB — HEMOGLOBIN A1C: Hgb A1c MFr Bld: 8.3 % — ABNORMAL HIGH (ref 4.6–6.5)

## 2015-05-10 LAB — TSH: TSH: 1.22 u[IU]/mL (ref 0.35–4.50)

## 2015-05-10 MED ORDER — LISINOPRIL 20 MG PO TABS: 20.0000 mg | ORAL_TABLET | Freq: Every day | ORAL | Status: DC

## 2015-05-10 NOTE — Assessment & Plan Note (Addendum)
Significant trouble with eyes recently leading to left vision loss. Unclear if due to diabetic proliferative retinopathy or hypertensive bleed. Pending surgery tomorrow by [facility].

## 2015-05-10 NOTE — Progress Notes (Signed)
Pre visit review using our clinic review tool, if applicable. No additional management support is needed unless otherwise documented below in the visit note. 

## 2015-05-10 NOTE — Assessment & Plan Note (Signed)
Uncontrolled, ran out of lisinopril 1 month ago. Will restart tonight and will defer to ophtho decision to proceed with surgery.

## 2015-05-10 NOTE — Assessment & Plan Note (Addendum)
Has not followed up with [facility]. Will update carotid US.

## 2015-05-10 NOTE — Progress Notes (Signed)
BP 160/110 mmHg  Pulse 84  Temp(Src) 98.4 F (36.9 C) (Oral)  Wt 194 lb (87.998 kg)   CC: new pt to re-establish  Subjective:    Patient ID: [name], female    DOB: 1956-04-14, 59 y.o.   MRN: [id number]  HPI: [name] is a 59 y.o. female presenting on 05/10/2015 for Blood Pressure Check   Father recently died 3 months ago, best friend passed away, other family members and several pets have recently died.  Last seen here 2012-01-10. Never followed up. Known embolic stroke thought from mobile calcified mitral valve structure 11/2011 with residual vision changes - floaters - that ultimately cleared up. Has not had PCP since then. Husband previously living in [place], now recently moved locally and found a job so she just received insurance. In interim, had TIA 05/2014 (presented with facial droop and slurred speech). Also has lost vision of left eye - told this was due to proliferative diabetic retinopathy s/p multiple laser surgeries. Sees [facility] Dr Noel Gerold [facility]). Pending L eye surgery, h/o R cataract removal and lens implant, right vision clear. Progressive L vision loss over last 1-2 months.   DM - diagnosed 25+ yrs ago, chronically uncontrolled. Vegan. She has been taking humulin (thinks she's taking N) 10-40 units nightly on a sliding scale. She buys this over the counter at [employer]. priro on BID dosing but was having lows. Lab Results  Component Value Date   HGBA1C 8.3* 05/10/2015   CAD - s/p stents x2 [place]. She is not taking plavix. Aspirin  was stopped in preparation for eye surgery.  Carotid stenosis s/p R CEA - overdue for f/u.   HTN - chronic issue. Prior on lisinopril  bid. Was taking lisinopril  nightly and this was controlling blood pressure well - but ran out of medicine 1 month ago. BP at preop 200/180s. Denies CP/tightness, SOB, leg swelling. Endorses intermittent headaches.  BP Readings from Last 3 Encounters:  05/10/15  160/110  12/10/13 188/82  09/04/12 144/90    Recurrent kidney stones - started natural supplement for this.   Relevant past medical, surgical, family and social history reviewed and updated as indicated. Interim medical history since our last visit reviewed. Allergies and medications reviewed and updated. Current Outpatient Prescriptions on File Prior to Visit  Medication Sig  . insulin NPH (HUMULIN N,NOVOLIN N) 100 UNIT/ML injection Inject 10-40 Units into the skin at bedtime. SLIDING SCALE  . nitroGLYCERIN (NITROSTAT) 0.4 MG SL tablet Place 0.4 mg under the tongue every 5 (five) minutes as needed. For chest pain   No current facility-administered medications on file prior to visit.    Review of Systems Per HPI unless specifically indicated in ROS section     Objective:    BP 160/110 mmHg  Pulse 84  Temp(Src) 98.4 F (36.9 C) (Oral)  Wt 194 lb (87.998 kg)  Wt Readings from Last 3 Encounters:  05/10/15 194 lb (87.998 kg)  12/10/13 202 lb 9 oz (91.882 kg)  09/04/12 194 lb (87.998 kg)    Physical Exam  Constitutional: She appears well-developed and well-nourished. No distress.  HENT:  Mouth/Throat: Oropharynx is clear and moist. No oropharyngeal exudate.  Neck: Normal range of motion. Neck supple. Carotid bruit is present (L>R bruit).  Cardiovascular: Normal rate, regular rhythm and intact distal pulses.   Murmur (faint systolic) heard. Pulmonary/Chest: Effort normal and breath sounds normal. No respiratory distress. She has no wheezes. She has no rales.  Musculoskeletal: She exhibits no edema.  Skin: Skin is warm and dry. No rash noted.  Nursing note and vitals reviewed.  Results for orders placed or performed in visit on 05/10/15  Lipid panel  Result Value Ref Range   Cholesterol 198 0 - 200 mg/dL   Triglycerides 62.2 0.0 - 149.0 mg/dL   HDL 29.79 >89.21 mg/dL   VLDL 19.4 0.0 - 17.4 mg/dL   LDL Cholesterol 081 (H) 0 - 99 mg/dL   Total CHOL/HDL Ratio 4    NonHDL  144.76   Comprehensive metabolic panel  Result Value Ref Range   Sodium 139 135 - 145 mEq/L   Potassium 4.6 3.5 - 5.1 mEq/L   Chloride 104 96 - 112 mEq/L   CO2 28 19 - 32 mEq/L   Glucose, Bld 244 (H) 70 - 99 mg/dL   BUN 20 6 - 23 mg/dL   Creatinine, Ser 4.48 0.40 - 1.20 mg/dL   Total Bilirubin 0.7 0.2 - 1.2 mg/dL   Alkaline Phosphatase 71 39 - 117 U/L   AST 16 0 - 37 U/L   ALT 16 0 - 35 U/L   Total Protein 7.4 6.0 - 8.3 g/dL   Albumin 4.2 3.5 - 5.2 g/dL   Calcium 9.9 8.4 - 18.5 mg/dL   GFR 63.14 >97.02 mL/min  TSH  Result Value Ref Range   TSH 1.22 0.35 - 4.50 uIU/mL  Hemoglobin A1c  Result Value Ref Range   Hgb A1c MFr Bld 8.3 (H) 4.6 - 6.5 %      Assessment & Plan:  Over 45 minutes were spent face-to-face with the patient during this encounter and >50% of that time was spent on counseling and coordination of care  Problem List Items Addressed This Visit    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 Unclear if due to diabetic proliferative retinopathy or hypertensive bleed. Pending surgery tomorrow by [facility].      Relevant Medications   lisinopril (PRINIVIL,ZESTRIL) 20 MG tablet   aspirin (ASPIRIN EC) 81 MG EC tablet   Other Relevant Orders   Lipid panel (Completed)   Comprehensive metabolic panel (Completed)   TSH (Completed)   Hemoglobin A1c (Completed)   Ambulatory referral to Endocrinology   Uncontrolled type 2 diabetes mellitus with circulatory disorder, with long-term current use of insulin (HCC)    Reviewed concerns for longstanding uncontrolled diabetes  She has had recurrent strokes, CAD, and continues to have uncontrolled diabetes. She treats with sliding scale humulin N (she thinks) which she buys over the counter at [employer] for affordability. Given chronically uncontrolled and significant  comorbidities I recommended referral to endocrionology to establish care. Pt agrees with plan.      Relevant Medications   lisinopril (PRINIVIL,ZESTRIL) 20 MG tablet   aspirin (ASPIRIN EC) 81 MG EC tablet   Other Relevant Orders   Lipid panel (Completed)   Comprehensive metabolic panel (Completed)   TSH (Completed)   Hemoglobin A1c (Completed)   Ambulatory referral to Endocrinology   Mitral valve annular calcification    ?embolic source of CVA - will refer to cards to re establish care.      Relevant Medications   lisinopril (PRINIVIL,ZESTRIL) 20 MG tablet   aspirin (ASPIRIN EC) 81 MG EC tablet   Hypertension, accelerated    Uncontrolled, ran out of lisinopril 1 month ago. Will restart tonight  and will defer to ophtho decision to proceed with surgery.      Relevant Medications   lisinopril (PRINIVIL,ZESTRIL) 20 MG tablet   aspirin (ASPIRIN EC) 81 MG EC tablet   Hyperlipidemia    Check FLP. Will likely need statin given DM history.      Relevant Medications   lisinopril (PRINIVIL,ZESTRIL) 20 MG tablet   aspirin (ASPIRIN EC) 81 MG EC tablet   Embolic stroke (HCC)    Reviewed high risk of recurrent stroke in setting of uncontrolled diabetes, hyperlipidemia and hypertension. Advised she restart aspirin as soon as possible after upcoming eye surgery. Reviewed hypercoagulable workup from 2013 - ?increased protein C and S activity, unsure significance.      Relevant Medications   lisinopril (PRINIVIL,ZESTRIL) 20 MG tablet   aspirin (ASPIRIN EC) 81 MG EC tablet   Carotid stenosis    Has not followed up with [facility]. Will update carotid US.      Relevant Medications   lisinopril (PRINIVIL,ZESTRIL) 20 MG tablet   aspirin (ASPIRIN EC) 81 MG EC tablet   Other Relevant Orders   Carotid   CAD (coronary artery disease) (Chronic)    Refer to Dr Shirlee Latch to re establish care. Encouraged she restart aspirin as soon as possible after upcoming eye surgery.      Relevant Medications    lisinopril (PRINIVIL,ZESTRIL) 20 MG tablet   aspirin (ASPIRIN EC) 81 MG EC tablet   Other Relevant Orders   Ambulatory referral to Cardiology       Follow up plan: Return in about 4 weeks (around 06/07/2015), or as needed, for follow up visit.

## 2015-05-10 NOTE — Patient Instructions (Addendum)
Your blood pressure is too high. Restart lisinopril 20mg  nightly.  We will refer you to endocrinologist and cardiologist.  We will schedule carotid ultrasound to evaluate neck blockages. See our referral coordinators. Fasting labs today.   Cancel next week's appointment, return to see me in 3-4 weeks, sooner if needed.

## 2015-05-10 NOTE — Assessment & Plan Note (Addendum)
Reviewed concerns for longstanding uncontrolled diabetes  She has had recurrent strokes, CAD, and continues to have uncontrolled diabetes. She treats with sliding scale humulin N (she thinks) which she buys over the counter at [employer] for affordability. Given chronically uncontrolled and significant comorbidities I recommended referral to endocrionology to establish care. Pt agrees with plan.

## 2015-05-10 NOTE — Assessment & Plan Note (Signed)
Check FLP. Will likely need statin given DM history.

## 2015-05-10 NOTE — Assessment & Plan Note (Addendum)
Reviewed high risk of recurrent stroke in setting of uncontrolled diabetes, hyperlipidemia and hypertension. Advised she restart aspirin as soon as possible after upcoming eye surgery. Reviewed hypercoagulable workup from 2013 - ?increased protein C and S activity, unsure significance.

## 2015-05-10 NOTE — Assessment & Plan Note (Addendum)
Refer to Dr Shirlee Latch to re establish care. Encouraged she restart aspirin as soon as possible after upcoming eye surgery.

## 2015-05-11 DIAGNOSIS — I3481 Nonrheumatic mitral (valve) annulus calcification: Secondary | ICD-10-CM | POA: Insufficient documentation

## 2015-05-11 DIAGNOSIS — I059 Rheumatic mitral valve disease, unspecified: Secondary | ICD-10-CM | POA: Insufficient documentation

## 2015-05-11 NOTE — Assessment & Plan Note (Signed)
?  embolic source of CVA - will refer to cards to re establish care.

## 2015-05-14 ENCOUNTER — Encounter: Payer: Self-pay | Admitting: Family Medicine

## 2015-05-17 ENCOUNTER — Ambulatory Visit: Payer: Self-pay | Admitting: Family Medicine

## 2015-05-25 NOTE — Progress Notes (Addendum)
Cardiology Office Note   Date:  05/26/2015   ID:  DAELYN PETTAWAY, DOB 10/26/1955, MRN [id number]   Patient Care Team: Eustaquio Boyden, MD as PCP - General (Family Medicine) Laurey Morale, MD (Cardiology)    Chief Complaint  Patient presents with  . Follow-up  . Coronary Artery Disease     History of Present Illness: [name] is a 59 y.o. female with a hx of CAD status post PCI in [place] in 2004, diabetes, prior CVA. She was admitted 6/13 with confusion and memory deficits. MRI demonstrated multiple small bilateral infarcts. She had severe right ICA stenosis but this did not explain the bilateral infarcts. TEE demonstrated mobile calcified structure attached to the mitral valve with heavy mitral annular calcification. 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 It was thought that the structure could be a potential source of embolism and Plavix was started. Event monitor demonstrated no atrial fibrillation. LHC in 10/13 demonstrated 95% mid LAD stenosis which was treated with angioplasty (vessel was not large enough for stent). She underwent right CEA 11/13. Last seen by Dr. Shirlee Latch 3/14. Continue Plavix therapy was recommended.  Patient was admitted to [facility] in 12/15 with TIA versus CVA. She had left facial droop that resolved by the time she presented to the hospital. Discharge summary indicates MRI was positive for CVA. Notes also indicate carotid US demonstrated LICA 70-99% stenosis. She had follow-up with a vascular surgeon in [place]. It is not clear if carotid endarterectomy was recommended.  Patient underwent laser eye surgery on the left about 2 weeks ago in [facility] for vitreous hemorrhage. She has been allowed to resume her aspirin. Of note, she has not been on Plavix for the past 2 years.  Her husband recently moved back to [place] from [place] (he was working there to help with her ill parents). Her insurance has been  resumed and she has started going back to doctors. She returns for follow-up. Review of her chart indicates that she was previously on ACE inhibitor, beta blocker and amlodipine. She is currently only on lisinopril. Blood pressures have been running higher at home. She does note increased stress. She denies chest pain, dyspnea, syncope. She denies orthopnea, PND or edema. She does have a dry nonproductive cough at times. She denies any bleeding issues. She does not smoke.   Studies/Reports Reviewed Today:  LHC 03/2012 LM: Minimal disease LAD: Proximal 30%, septal perforator severely diseased, mid LAD 95% LCx: Luminal irregularities RCA: Ostial 50%, proximal stent patent with 40-50% prior to stent Final Conclusions: 95% mid LAD stenosis is the likely cause of patient's symptoms. Reload 300 mg Plavix. Dr. Clifton James will intervene today. I spoke with Dr. Myra Gianotti, will delay carotid endarterectomy that had been scheduled for Thursday.  PCI:  Successful PTCA with balloon angioplasty only of the distal LAD. (vessel not large enough for a stent).   Myoview 8/13 Low risk, mild inferior scar, no ischemia, EF 53%  Echo 6/13 Mild LVH, EF 55-60%, normal wall motion, MAC, <1 cm calcified mobile nodule attached mitral valve annulus, Mild LAE, normal RV function  Past Medical History  Diagnosis Date  . CAD (coronary artery disease)     a. PCI [place] 2004. b. Angina - balloon angioplasty to LAD 04/21/12 (vessel too small for stent).  . Refusal of blood transfusions as patient is Jehovah's Witness   . HLD (hyperlipidemia)     a. takes Lipitor daily  .  Asthma   . Migraine aura without headache     a. visual changes  . Chronic lower back pain   . Anxiety     a. with claustrophobia  . Carotid stenosis     a. 11/2011 critical R, ~30% L by angiogram;  b. 04/2012 s/p R CEA.  Marland Kitchen Hypertension, accelerated   . Peripheral neuropathy (HCC)   . Embolic stroke (HCC)     a. 11/2011 MRI: mult small b/l infarcts  c/w emboli;  b. 11/2011 MRA neck: high grade RICA stenosis;  c. 6/20013 TEE: EF 55-60% with mild LVH, severe MAC, < 1 cm calc mobile structure on the MV annulus, ? 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 Thought was RICA stenosis did not explain the b/l small strokes; concern for emboli from mobile MV struct;  d. 01/2012 Event Mon:  no AFib  . History of kidney stones   . Diabetes mellitus type 2, uncontrolled (HCC)   . Cataract     left  . Depression     a. occasionally takes [facility] john's wart  . Arthritis     "right hand; maybe just starting in my left hand" (04/21/2012)  1. Diabetes mellitus  2. Hyperlipidemia 3. HTN 4. CVA: 6/13 admission with confusion and memory problems. MRI showed multiple small bilateral infarcts c/w emboli. MRA neck showed high grade RICA stenosis. 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 It was thought that the RICA stenosis could not explain the bilateral small strokes and concern was for emboli from the mobile calcified mitral annular structure. 3-week event monitor (8/13) showed no atrial fibrillation. Right CEA in 11/13.  5. Carotid stenosis: 85-90% RICA on 6/13 cerebral angiogram.  6. CAD s/p PCI x 2 in 2004 in [place]. Lexiscan myoview (7/13) showed small basal inferior scar with no ischemia, EF 53%, inferobasal hypokinesis. LHC (10/13) with 95% mLAD stenosis. The vessel was small caliber, she had PTCA only.  7. Asthma   Past Surgical History  Procedure Laterality Date  . Kidney stone surgery      several times  . Carpal tunnel release  ~ 2005    bilateral  . Cataract extraction w/ intraocular lens implant  2012    right  . Tee without cardioversion  12/14/2011    Procedure: TRANSESOPHAGEAL ECHOCARDIOGRAM (TEE);  Surgeon: Laurey Morale, MD;  Location: [facility] ENDOSCOPY;  Service: Cardiovascular;  Laterality:  N/A;  . Ankle surgery  2005    after fall down stairs, left with screws and plate in place  . Holter monitor  2013    WNL  . US echocardiography  2002    normal LV fxn, mild LVH, borderline L atrial size  . Lithotripsy    . Coronary angioplasty with stent placement  2002    x2  . Cardiac catheterization  2004    WNL, no significant coronary disease  . Cardiac catheterization  12/19/11  . Coronary angioplasty  04/21/2012    95% stenosis mid LAD, vessel not large enough for stent, rec plavix/ASA for 1 mo  . Endarterectomy  05/09/2012    Procedure: ENDARTERECTOMY CAROTID;  Surgeon: Nada Libman, MD;  Location: [facility] OR;  Service: Vascular;  Laterality: Right;  Right carotd endarterectomy with bovine patch angioplasty  . Carotid endarterectomy    . Carotid angiogram Bilateral 12/19/2011  Procedure: CAROTID ANGIOGRAM;  Surgeon: Nada Libman, MD;  Location: [facility] CATH LAB;  Service: Cardiovascular;  Laterality: Bilateral;  . Percutaneous coronary stent intervention (pci-s) N/A 04/21/2012    Procedure: PERCUTANEOUS CORONARY STENT INTERVENTION (PCI-S);  Surgeon: Kathleene Hazel, MD;  Location: [facility] CATH LAB;  Service: Cardiovascular;  Laterality: N/A;  . Pars plana vitrectomy Left 04/2015    [facility] Dr Chaney Born)     Current Outpatient Prescriptions  Medication Sig Dispense Refill  . aspirin (ASPIRIN EC) 81 MG EC tablet Take 81 mg by mouth daily. Swallow whole.    . insulin NPH (HUMULIN N,NOVOLIN N) 100 UNIT/ML injection Inject 10-40 Units into the skin at bedtime. SLIDING SCALE    . lisinopril (PRINIVIL,ZESTRIL) 20 MG tablet Take 1 tablet (20 mg total) by mouth daily. 90 tablet 3  . nitroGLYCERIN (NITROSTAT) 0.4 MG SL tablet Place 0.4 mg under the tongue every 5 (five) minutes as needed. For chest pain    . atorvastatin (LIPITOR) 40 MG tablet Take 1 tablet (40 mg total) by mouth daily. 30 tablet 11  . bisoprolol (ZEBETA) 5 MG tablet Take 1 tablet (5 mg total) by mouth daily. 30 tablet 11     No current facility-administered medications for this visit.    Allergies:   Isosorbide; Metformin and related; Penicillins; Toradol; and Other    Social History:   Social History   Social History  . Marital Status: Married    Spouse Name: N/A  . Number of Children: N/A  . Years of Education: N/A   Social History Main Topics  . Smoking status: Former Smoker -- 1.00 packs/day for 3 years    Types: Cigarettes    Quit date: 06/26/1975  . Smokeless tobacco: Never Used  . Alcohol Use: No     Comment: none since 11/2011  . Drug Use: No  . Sexual Activity: Yes    Birth Control/ Protection: Post-menopausal   Other Topics Concern  . None   Social History Narrative   Caffeine: 2 cups/day coffee   Lives alone, husband in [place].  4 children nearby, son to live with her.   Occupation: unemployed, prior worked as IT consultant   Activity: stays active on farm   Diet: vegan, no processed foods, good water daily, fruits/vegetables daily   jehovah's witness - no blood products.     Family History:   Family History  Problem Relation Age of Onset  . Heart disease Mother     valve problems  . Heart disease Sister     valve problems  . Heart disease Brother   . Stroke Mother   . Stroke Brother 32  . Diabetes Father   . Cancer Neg Hx   . Hypertension Father   . Hypertension Mother       ROS:   Please see the history of present illness.   Review of Systems  Eyes: Positive for visual disturbance.  Psychiatric/Behavioral: Positive for depression. The patient is nervous/anxious.       PHYSICAL EXAM: VS:  BP 176/100 mmHg  Pulse 77  Ht 5' 6.5" (1.689 m)  Wt 195 lb 6.4 oz (88.633 kg)  BMI 31.07 kg/m2    Wt Readings from Last 3 Encounters:  05/26/15 195 lb 6.4 oz (88.633 kg)  05/10/15 194 lb (87.998 kg)  12/10/13 202 lb 9 oz (91.882 kg)     GEN: Well nourished, well developed, in no acute distress HEENT: normal Neck: no JVD, faint L carotid bruit, + R CEA scar,  no  masses Cardiac:  Normal S1/S2, RRR; 2/6 systolic murmur RUSB,  no rubs or gallops, no edema   Respiratory:  clear to auscultation bilaterally, no wheezing, rhonchi or rales. GI: soft, nontender, nondistended, + BS MS: no deformity or atrophy Skin: warm and dry  Neuro:  CNs II-XII intact, Strength and sensation are intact Psych: Normal affect   EKG:  EKG is ordered today.  It demonstrates:   NSR, HR 77, PRWP, QTc 466 ms   Recent Labs: 05/10/2015: ALT 16; BUN 20; Creatinine, Ser 0.88; Potassium 4.6; Sodium 139; TSH 1.22    Lipid Panel    Component Value Date/Time   CHOL 198 05/10/2015 1211   CHOL 220* 05/24/2014 0038   TRIG 86.0 05/10/2015 1211   TRIG 164 05/24/2014 0038   HDL 53.70 05/10/2015 1211   HDL 51 05/24/2014 0038   CHOLHDL 4 05/10/2015 1211   VLDL 17.2 05/10/2015 1211   VLDL 33 05/24/2014 0038   LDLCALC 128* 05/10/2015 1211   LDLCALC 136* 05/24/2014 0038      ASSESSMENT AND PLAN:  1. CAD:  Hx of RCA stenting in [place] in 2004 and PTCA only to LAD in 2013.  She denies angina.  However, in review of her hx, she appears to have significant L sided ICA stenosis and TIA vs CVA in 05/2014.  She will be referred back to [facility].  I will arrange Lexiscan Myoview (with her high BP and potential critical LICA stenosis, I do not want to exercise her) for risk stratification as there is a high likelihood she may need CEA.  Continue ASA.  She is no longer on statin for unclear reasons. This will be resumed.    2. CVA:  Multiple bilateral small infarcts found in 6/13. This was not explained by RICA stenosis. It was felt that she may have had emboli from the mobile structure on the mitral valve annulus. 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 Blood cultures were negative. 3 week event monitor in 8/13 did not show any atrial fibrillation.  She was on Plavix, which was probably  the appropriate intervention.  However, she is now on ASA only.  She had recurrent cerebral ischemia and what sounds like critical ICA stenosis on the L.  She did have recent eye surgery in [facility].  -  I will contact her Ophthalmologist to see if it is ok to start Plavix in addition to ASA. >> ADDENDUM 05/27/2015 1:27 PM >> ok to resume Plavix per ophthalmologist.  Will start Plavix 75 mg QD.  -  Refer to Dr. Myra Gianotti who did her R CEA  -  Aggressive BP control.  -  Resume statin Rx.  3. HTN:  Uncontrolled.  Continue Lisinopril.  Start Bisoprolol 5 mg QD.  I have asked her to monitor BP at home and bring readings to her next visit. Consider increasing Lisinopril vs adding HCTZ at FU if BP remains elevated.  4. Hyperlipidemia:  Recent LDL 126.  LDL goal < 70 with CAD, vascular disease.  Start Atorvastatin 40 mg QD.  Check Lipids and LFTs in 6 weeks.    5. Carotid Stenosis:  S/p R CEA.  Admit to [facility] in 12/15. DC summary indicates LICA 70-99% stenosis.  Refer back to Dr. Myra Gianotti ASAP.  Continue ASA.  Will ask Ophthalmology if we can resume Plavix with recent  eye surgery.    6.  Diabetes Mellitus:  Recent A1c 8.3. FU with PCP for aggressive glucose control given CAD, vascular disease and diabetic retinopathy.  7. Murmur:  Obtain FU echo given hx of prior CVA and MAC noted in 2013.     Medication Changes: Current medicines are reviewed at length with the patient today.  Concerns regarding medicines are as outlined above.  The following changes have been made:   Discontinued Medications   INSULIN NPH-REGULAR HUMAN (NOVOLIN 70/30) (70-30) 100 UNIT/ML INJECTION    Inject into the skin.   Modified Medications   No medications on file   New Prescriptions   ATORVASTATIN (LIPITOR) 40 MG TABLET    Take 1 tablet (40 mg total) by mouth daily.   BISOPROLOL (ZEBETA) 5 MG TABLET    Take 1 tablet (5 mg total) by mouth daily.   Labs/ tests ordered today include:   Orders Placed This Encounter   Procedures  . Hepatic function panel  . Lipid Profile  . Ambulatory referral to Vascular Surgery  . Myocardial Perfusion Imaging  . EKG 12-Lead  . Echocardiogram     Disposition:    I spent > 40 minutes evaluating the patient, reviewing records and establishing management. Face to face time > 50% FU with me or Dr. [name] 2 weeks.     Signed, Brynda [name], MHS 05/26/2015 1:49 PM    [facility] [address] [place], [place], [place]  [address] Phone: [phone]; Fax: [phone]

## 2015-05-26 ENCOUNTER — Ambulatory Visit (INDEPENDENT_AMBULATORY_CARE_PROVIDER_SITE_OTHER): Payer: Managed Care, Other (non HMO) | Admitting: Physician Assistant

## 2015-05-26 ENCOUNTER — Encounter: Payer: Self-pay | Admitting: Physician Assistant

## 2015-05-26 VITALS — BP 176/100 | HR 77 | Ht 66.5 in | Wt 195.4 lb

## 2015-05-26 DIAGNOSIS — Z794 Long term (current) use of insulin: Secondary | ICD-10-CM

## 2015-05-26 DIAGNOSIS — I059 Rheumatic mitral valve disease, unspecified: Secondary | ICD-10-CM

## 2015-05-26 DIAGNOSIS — R011 Cardiac murmur, unspecified: Secondary | ICD-10-CM

## 2015-05-26 DIAGNOSIS — Z01810 Encounter for preprocedural cardiovascular examination: Secondary | ICD-10-CM

## 2015-05-26 DIAGNOSIS — I10 Essential (primary) hypertension: Secondary | ICD-10-CM

## 2015-05-26 DIAGNOSIS — I2510 Atherosclerotic heart disease of native coronary artery without angina pectoris: Secondary | ICD-10-CM | POA: Diagnosis not present

## 2015-05-26 DIAGNOSIS — Z8673 Personal history of transient ischemic attack (TIA), and cerebral infarction without residual deficits: Secondary | ICD-10-CM | POA: Diagnosis not present

## 2015-05-26 DIAGNOSIS — I3481 Nonrheumatic mitral (valve) annulus calcification: Secondary | ICD-10-CM

## 2015-05-26 DIAGNOSIS — I6523 Occlusion and stenosis of bilateral carotid arteries: Secondary | ICD-10-CM

## 2015-05-26 DIAGNOSIS — I6522 Occlusion and stenosis of left carotid artery: Secondary | ICD-10-CM

## 2015-05-26 DIAGNOSIS — E11319 Type 2 diabetes mellitus with unspecified diabetic retinopathy without macular edema: Secondary | ICD-10-CM

## 2015-05-26 DIAGNOSIS — E785 Hyperlipidemia, unspecified: Secondary | ICD-10-CM

## 2015-05-26 MED ORDER — ATORVASTATIN CALCIUM 40 MG PO TABS: 40.0000 mg | ORAL_TABLET | Freq: Every day | ORAL | Status: DC

## 2015-05-26 MED ORDER — BISOPROLOL FUMARATE 5 MG PO TABS: 5.0000 mg | ORAL_TABLET | Freq: Every day | ORAL | Status: DC

## 2015-05-26 NOTE — Patient Instructions (Signed)
Medication Instructions:  1. START BISOPROLOL 5 MG DAILY  2. START ATORVASTATIN 40 MG DAILY  Labwork: LIPID AND LIVER PANEL TO BE DONE IN 6 WEEKS  Testing/Procedures: 1. Your physician has requested that you have an echocardiogram. Echocardiography is a painless test that uses sound waves to create images of your heart. It provides your doctor with information about the size and shape of your heart and how well your heart's chambers and valves are working. This procedure takes approximately one hour. There are no restrictions for this procedure.  2. Your physician has requested that you have a lexiscan myoview. For further information please visit [email or web address] Please follow instruction sheet, as given.   Follow-Up: 1. 2 WEEKS WITH SCOTT WEAVER, PAC SAME DAY DR. Shirlee Latch IS IN THE OFFICE IF POSSIBLE  2. YOU ARE BEING REFERRED TO DR. Myra Gianotti ASAP ;   Any Other Special Instructions Will Be Listed Below (If Applicable).     If you need a refill on your cardiac medications before your next appointment, please call your pharmacy.

## 2015-05-27 ENCOUNTER — Other Ambulatory Visit: Payer: Self-pay | Admitting: *Deleted

## 2015-05-27 ENCOUNTER — Telehealth: Payer: Self-pay | Admitting: *Deleted

## 2015-05-27 DIAGNOSIS — I6523 Occlusion and stenosis of bilateral carotid arteries: Secondary | ICD-10-CM

## 2015-05-27 NOTE — Telephone Encounter (Signed)
VM not set up on phone. Husband's vm not set up. [email or web address] on daughter's phone.

## 2015-05-30 ENCOUNTER — Ambulatory Visit (HOSPITAL_COMMUNITY)
Admission: RE | Admit: 2015-05-30 | Discharge: 2015-05-30 | Disposition: A | Discharge status: Home / Self Care | Payer: Managed Care, Other (non HMO) | Source: Ambulatory Visit | Attending: Surgery | Admitting: Surgery

## 2015-05-30 ENCOUNTER — Encounter: Payer: Self-pay | Admitting: Surgery

## 2015-05-30 ENCOUNTER — Ambulatory Visit (INDEPENDENT_AMBULATORY_CARE_PROVIDER_SITE_OTHER): Payer: Managed Care, Other (non HMO) | Admitting: Surgery

## 2015-05-30 ENCOUNTER — Other Ambulatory Visit: Payer: Self-pay

## 2015-05-30 VITALS — BP 181/108 | HR 73 | Temp 98.1°F | Resp 16 | Ht 66.5 in | Wt 196.0 lb

## 2015-05-30 DIAGNOSIS — I6523 Occlusion and stenosis of bilateral carotid arteries: Secondary | ICD-10-CM

## 2015-05-30 DIAGNOSIS — I10 Essential (primary) hypertension: Secondary | ICD-10-CM | POA: Diagnosis not present

## 2015-05-30 DIAGNOSIS — E119 Type 2 diabetes mellitus without complications: Secondary | ICD-10-CM | POA: Insufficient documentation

## 2015-05-30 DIAGNOSIS — I6522 Occlusion and stenosis of left carotid artery: Secondary | ICD-10-CM | POA: Diagnosis not present

## 2015-05-30 DIAGNOSIS — E785 Hyperlipidemia, unspecified: Secondary | ICD-10-CM | POA: Diagnosis not present

## 2015-05-30 NOTE — Progress Notes (Signed)
Filed Vitals:   05/30/15 1407 05/30/15 1408 05/30/15 1415 05/30/15 1416  BP: 174/99 177/101 179/110 181/108  Pulse: 74 72 71 73  Temp:  98.1 F (36.7 C)    TempSrc:  Oral    Resp:  16    Height:  5' 6.5" (1.689 m)    Weight:  196 lb (88.905 kg)    SpO2:  96%

## 2015-05-30 NOTE — Progress Notes (Signed)
Patient name: [name] MRN: [id number] DOB: 04-21-1956 Sex: female     Chief Complaint  Patient presents with  . New Evaluation    Ref. by Dr. Einar Crow  C/O Carotid Stenosis  S Hx Right CEA  2013    HISTORY OF PRESENT ILLNESS: The patient is back for follow-up.  I have not seen her for several years.  We initially met in 2013 when she presented to the hospital with confusion and short-term memory loss.  She was amnestic to these events.  She had an MRI which showed multiple small areas of acute infarct, bilaterally.  She had a TEE which suggested a possible cardiac source.  In addition, I performed carotid angiography which revealed approximately 50% left carotid stenosis and 80% right carotid stenosis.  Ultimately, the patient underwent a right carotid endarterectomy on 05/09/2012.  Intraoperative findings were an 85% lesion.  The patient did not return for follow-up.  In December 2015, she was admitted to [facility] with a left facial droop.  Reportedly, she was diagnosed with a stroke.  She was seen by vascular surgery but elected not to have a left carotid intervention.  She comes in today for follow-up.  She has not had any symptoms consistent with left carotid stenosis.  Patient has a significant cardiac history.  She had a 95% mid LAD stenosis treated with angioplasty in 2013 which preceded her carotid endarterectomy.  She had an event monitor which did not demonstrate any atrial fibrillation.  The patient has recently undergone laser eye surgery for a vitreous hemorrhage.  She is now back on aspirin.  The patient has a history of diabetes which is poorly controlled.  She is a former smoker having quit in 1977.  She suffers from hypercholesterolemia which is managed with a statin.  Her blood pressure is managed with an ACE inhibitor.  Past Medical History  Diagnosis Date  . CAD (coronary artery disease)     a. PCI [place] 2004. b. Angina - balloon angioplasty  to LAD 04/21/12 (vessel too small for stent).  . Refusal of blood transfusions as patient is Jehovah's Witness   . HLD (hyperlipidemia)     a. takes Lipitor daily  . Asthma   . Migraine aura without headache     a. visual changes  . Chronic lower back pain   . Anxiety     a. with claustrophobia  . Carotid stenosis     a. 11/2011 critical R, ~30% L by angiogram;  b. 04/2012 s/p R CEA.  Marland Kitchen Hypertension, accelerated   . Peripheral neuropathy ([place])   . Embolic stroke ([place])     a. 11/2011 MRI: mult small b/l infarcts c/w emboli;  b. 11/2011 MRA neck: high grade RICA stenosis;  c. 6/20013 TEE: EF 55-60% with mild LVH, severe MAC, < 1 cm calc mobile structure on the MV annulus, ? 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 Thought was RICA stenosis did not explain the b/l small strokes; concern for emboli from mobile MV struct;  d. 01/2012 Event Mon:  no AFib  . History of kidney stones   . Diabetes mellitus type 2, uncontrolled ([place])   . Cataract     left  . Depression     a. occasionally takes st john's wart  . Arthritis     "right hand; maybe just starting in my left hand" (04/21/2012)    Past Surgical History  Procedure Laterality Date  .  Kidney stone surgery      several times  . Carpal tunnel release  ~ 2005    bilateral  . Cataract extraction w/ intraocular lens implant  2012    right  . Tee without cardioversion  12/14/2011    Procedure: TRANSESOPHAGEAL ECHOCARDIOGRAM (TEE);  Surgeon: Larey Dresser, MD;  Location: [facility];  Service: Cardiovascular;  Laterality: N/A;  . Ankle surgery  2005    after fall down stairs, left with screws and plate in place  . Holter monitor  2013    WNL  . US echocardiography  2002    normal LV fxn, mild LVH, borderline L atrial size  . Lithotripsy    . Coronary angioplasty with stent placement  2002    x2  . Cardiac catheterization  2004    WNL, no significant coronary disease  . Cardiac catheterization  12/19/11  . Coronary  angioplasty  04/21/2012    95% stenosis mid LAD, vessel not large enough for stent, rec plavix/ASA for 1 mo  . Endarterectomy  05/09/2012    Procedure: ENDARTERECTOMY CAROTID;  Surgeon: Serafina Mitchell, MD;  Location: [facility] OR;  Service: Vascular;  Laterality: Right;  Right carotd endarterectomy with bovine patch angioplasty  . Carotid endarterectomy    . Carotid angiogram Bilateral 12/19/2011    Procedure: CAROTID ANGIOGRAM;  Surgeon: Serafina Mitchell, MD;  Location: [facility] CATH LAB;  Service: Cardiovascular;  Laterality: Bilateral;  . Percutaneous coronary stent intervention (pci-s) N/A 04/21/2012    Procedure: PERCUTANEOUS CORONARY STENT INTERVENTION (PCI-S);  Surgeon: Burnell Blanks, MD;  Location: [facility] CATH LAB;  Service: Cardiovascular;  Laterality: N/A;  . Pars plana vitrectomy Left 04/2015    [facility] Dr Danella Sensing)    Social History   Social History  . Marital Status: Married    Spouse Name: N/A  . Number of Children: N/A  . Years of Education: N/A   Occupational History  . Not on file.   Social History Main Topics  . Smoking status: Former Smoker -- 1.00 packs/day for 3 years    Types: Cigarettes    Quit date: 06/26/1975  . Smokeless tobacco: Never Used  . Alcohol Use: No     Comment: none since 11/2011  . Drug Use: No  . Sexual Activity: Yes    Birth Control/ Protection: Post-menopausal   Other Topics Concern  . Not on file   Social History Narrative   Caffeine: 2 cups/day coffee   Lives alone, husband in [place].  4 children nearby, son to live with her.   Occupation: unemployed, prior worked as Radio broadcast assistant   Activity: stays active on farm   Diet: vegan, no processed foods, good water daily, fruits/vegetables daily   jehovah's witness - no blood products.    Family History  Problem Relation Age of Onset  . Heart disease Mother     valve problems  . Stroke Mother   . Hypertension Mother   . Heart disease Sister     valve problems  . Heart disease Brother   .  Diabetes Father   . Hypertension Father   . Cancer Neg Hx     Allergies as of 05/30/2015 - Review Complete 05/30/2015  Allergen Reaction Noted  . Isosorbide Other (See Comments) 04/21/2012  . Metformin and related Diarrhea and Nausea And Vomiting 01/09/2012  . Penicillins Itching and Other (See Comments) 12/12/2011  . Toradol [ketorolac tromethamine] Other (See Comments) 04/14/2012  . Other  05/09/2012    Current Outpatient  Prescriptions on File Prior to Visit  Medication Sig Dispense Refill  . aspirin (ASPIRIN EC) 81 MG EC tablet Take 81 mg by mouth daily. Swallow whole.    Marland Kitchen atorvastatin (LIPITOR) 40 MG tablet Take 1 tablet (40 mg total) by mouth daily. 30 tablet 11  . bisoprolol (ZEBETA) 5 MG tablet Take 1 tablet (5 mg total) by mouth daily. 30 tablet 11  . insulin NPH (HUMULIN N,NOVOLIN N) 100 UNIT/ML injection Inject 10-40 Units into the skin at bedtime. SLIDING SCALE    . lisinopril (PRINIVIL,ZESTRIL) 20 MG tablet Take 1 tablet (20 mg total) by mouth daily. 90 tablet 3  . nitroGLYCERIN (NITROSTAT) 0.4 MG SL tablet Place 0.4 mg under the tongue every 5 (five) minutes as needed. For chest pain     No current facility-administered medications on file prior to visit.     REVIEW OF SYSTEMS: Cardiovascular: No chest pain, chest pressure, palpitations, orthopnea, or dyspnea on exertion. No claudication or rest pain,  No history of DVT or phlebitis. Pulmonary: No productive cough, asthma or wheezing. Neurologic: No weakness, paresthesias, aphasia, or amaurosis. No dizziness. Hematologic: No bleeding problems or clotting disorders. Musculoskeletal: No joint pain or joint swelling. Gastrointestinal: No blood in stool or hematemesis Genitourinary: No dysuria or hematuria. Psychiatric:: No history of major depression. Integumentary: No rashes or ulcers. Constitutional: No fever or chills.  PHYSICAL EXAMINATION:   Vital signs are  Filed Vitals:   05/30/15 1407 05/30/15 1408  05/30/15 1415 05/30/15 1416  BP: 174/99 177/101 179/110 181/108  Pulse: 74 72 71 73  Temp:  98.1 F (36.7 C)    TempSrc:  Oral    Resp:  16    Height:  5' 6.5" (1.689 m)    Weight:  196 lb (88.905 kg)    SpO2:  96%     Body mass index is 31.16 kg/(m^2). General: The patient appears their stated age. HEENT:  No gross abnormalities Pulmonary:  Non labored breathing Abdomen: Soft and non-tender Musculoskeletal: There are no major deformities. Neurologic: No focal weakness or paresthesias are detected, Skin: There are no ulcer or rashes noted. Psychiatric: The patient has normal affect. Cardiovascular: There is a regular rate and rhythm without significant murmur appreciated.  Positive left carotid bruit   Diagnostic Studies The patient underwent carotid Doppler study today.  This shows a less than 40% right carotid stenosis and a 80-99 percent stenosis on the left.  Her bifurcation was high.  I reviewed the angiogram from 2013.  I do not think her bifurcation is too high to preclude operating  Assessment: Asymptomatic left carotid stenosis Plan: Based on her ultrasound, I have recommended proceeding with left carotid endarterectomy.  I discussed the risks and benefits of the procedure with the patient which include but are not limited to the risk of stroke, the risk of nerve damage, cardia pulmonary compilations and bleeding.  I have asked that she stop her over-the-counter blood thinners 5 days prior to her operation.  She will also get a formal cardiology clearance.  I'm also going to have her evaluated by ear nose and throat to make sure her vocal cords are functioning properly.  Once she has formal clearance, we will schedule her left carotid endarterectomy.  Eldridge Abrahams, M.D. [facility] Office: [phone] Pager:  [phone]

## 2015-05-30 NOTE — Addendum Note (Signed)
Addended by: Adria Dill L on: 05/30/2015 05:02 PM   Modules accepted: Orders

## 2015-06-03 ENCOUNTER — Ambulatory Visit (INDEPENDENT_AMBULATORY_CARE_PROVIDER_SITE_OTHER): Payer: Managed Care, Other (non HMO) | Admitting: Internal Medicine

## 2015-06-03 ENCOUNTER — Encounter: Payer: Self-pay | Admitting: Internal Medicine

## 2015-06-03 VITALS — BP 112/80 | HR 70 | Temp 98.6°F | Resp 12 | Ht 66.5 in | Wt 200.0 lb

## 2015-06-03 DIAGNOSIS — Z794 Long term (current) use of insulin: Secondary | ICD-10-CM | POA: Diagnosis not present

## 2015-06-03 DIAGNOSIS — E1165 Type 2 diabetes mellitus with hyperglycemia: Secondary | ICD-10-CM | POA: Diagnosis not present

## 2015-06-03 DIAGNOSIS — E1159 Type 2 diabetes mellitus with other circulatory complications: Secondary | ICD-10-CM

## 2015-06-03 DIAGNOSIS — IMO0002 Reserved for concepts with insufficient information to code with codable children: Secondary | ICD-10-CM

## 2015-06-03 MED ORDER — INSULIN GLARGINE 100 UNIT/ML SOLOSTAR PEN: 30.0000 [IU] | PEN_INJECTOR | Freq: Every day | SUBCUTANEOUS | Status: DC

## 2015-06-03 MED ORDER — INSULIN PEN NEEDLE 32G X 4 MM MISC: Status: DC

## 2015-06-03 MED ORDER — ALOGLIPTIN BENZOATE 25 MG PO TABS: ORAL_TABLET | ORAL | Status: DC

## 2015-06-03 NOTE — Patient Instructions (Addendum)
Please stop NPH.  Lantus 30 units at bedtime. If sugars in am are not <150 in am >> increase to 35 units.  Please start Alogliptin 25 mg in am, before breakfast.  Please return in 1.5 months with your sugar log.   Please let me know if the sugars are consistently <80 or >200.  PATIENT INSTRUCTIONS FOR TYPE 2 DIABETES:  **Please join MyChart!** - see attached instructions about how to join if you have not done so already.  DIET AND EXERCISE Diet and exercise is an important part of diabetic treatment.  We recommended aerobic exercise in the form of brisk walking (working between 40-60% of maximal aerobic capacity, similar to brisk walking) for 150 minutes per week (such as 30 minutes five days per week) along with 3 times per week performing 'resistance' training (using various gauge rubber tubes with handles) 5-10 exercises involving the major muscle groups (upper body, lower body and core) performing 10-15 repetitions (or near fatigue) each exercise. Start at half the above goal but build slowly to reach the above goals. If limited by weight, joint pain, or disability, we recommend daily walking in a swimming pool with water up to waist to reduce pressure from joints while allow for adequate exercise.    BLOOD GLUCOSES Monitoring your blood glucoses is important for continued management of your diabetes. Please check your blood glucoses 2-4 times a day: fasting, before meals and at bedtime (you can rotate these measurements - e.g. one day check before the 3 meals, the next day check before 2 of the meals and before bedtime, etc.).   HYPOGLYCEMIA (low blood sugar) Hypoglycemia is usually a reaction to not eating, exercising, or taking too much insulin/ other diabetes drugs.  Symptoms include tremors, sweating, hunger, confusion, headache, etc. Treat IMMEDIATELY with 15 grams of Carbs: . 4 glucose tablets .  cup regular juice/soda . 2 tablespoons raisins . 4 teaspoons sugar . 1  tablespoon honey Recheck blood glucose in 15 mins and repeat above if still symptomatic/blood glucose <100.  RECOMMENDATIONS TO REDUCE YOUR RISK OF DIABETIC COMPLICATIONS: * Take your prescribed MEDICATION(S) * Follow a DIABETIC diet: Complex carbs, fiber rich foods, (monounsaturated and polyunsaturated) fats * AVOID saturated/trans fats, high fat foods, >2,300 mg salt per day. * EXERCISE at least 5 times a week for 30 minutes or preferably daily.  * DO NOT SMOKE OR DRINK more than 1 drink a day. * Check your FEET every day. Do not wear tightfitting shoes. Contact us if you develop an ulcer * See your EYE doctor once a year or more if needed * Get a FLU shot once a year * Get a PNEUMONIA vaccine once before and once after age 69 years  GOALS:  * Your Hemoglobin A1c of <7%  * fasting sugars need to be <130 * after meals sugars need to be <180 (2h after you start eating) * Your Systolic BP should be 140 or lower  * Your Diastolic BP should be 80 or lower  * Your HDL (Good Cholesterol) should be 40 or higher  * Your LDL (Bad Cholesterol) should be 100 or lower. * Your Triglycerides should be 150 or lower  * Your Urine microalbumin (kidney function) should be <30 * Your Body Mass Index should be 25 or lower    Please consider the following ways to cut down carbs and fat and increase fiber and micronutrients in your diet: - substitute whole grain for white bread or pasta - substitute brown rice  for white rice - substitute 90-calorie flat bread pieces for slices of bread when possible - substitute sweet potatoes or yams for white potatoes - substitute humus for margarine - substitute tofu for cheese when possible - substitute almond or rice milk for regular milk (would not drink soy milk daily due to concern for soy estrogen influence on breast cancer risk) - substitute dark chocolate for other sweets when possible - substitute water - can add lemon or orange slices for taste - for diet  sodas (artificial sweeteners will trick your body that you can eat sweets without getting calories and will lead you to overeating and weight gain in the long run) - do not skip breakfast or other meals (this will slow down the metabolism and will result in more weight gain over time)  - can try smoothies made from fruit and almond/rice milk in am instead of regular breakfast - can also try old-fashioned (not instant) oatmeal made with almond/rice milk in am - order the dressing on the side when eating salad at a restaurant (pour less than half of the dressing on the salad) - eat as little meat as possible - can try juicing, but should not forget that juicing will get rid of the fiber, so would alternate with eating raw veg./fruits or drinking smoothies - use as little oil as possible, even when using olive oil - can dress a salad with a mix of balsamic vinegar and lemon juice, for e.g. - use agave nectar, stevia sugar, or regular sugar rather than artificial sweateners - steam or broil/roast veggies  - snack on veggies/fruit/nuts (unsalted, preferably) when possible, rather than processed foods - reduce or eliminate aspartame in diet (it is in diet sodas, chewing gum, etc) Read the labels!  Try to read Dr. Katherina Right book: "Program for Reversing Diabetes" for other ideas for healthy eating.

## 2015-06-03 NOTE — Progress Notes (Signed)
Patient ID: [name], female   DOB: 06/15/1956, 59 y.o.   MRN: [id number]  HPI: [name] is a 59 y.o.-year-old female, referred by her PCP, [name], for management of DM2, dx in 1991, insulin-dependent since 1990s, uncontrolled, with complications (CAD, carotid stenosis, cerebrovascular disease, proliferative Sherry, PN).  She did not have insurance for 8-9 years >> she self medicated with over-the-counter insulin.  Last hemoglobin A1c was: Lab Results  Component Value Date   HGBA1C 8.3* 05/10/2015   HGBA1C 8.9* 05/24/2014   HGBA1C 9.9* 05/09/2012  Prev. 11.6% Prev. 12.3%  Pt is on a regimen of: - Humulin NPH (SSI!) 10-20 units in am 10-30 units at before dinner (+/- 10 units at bedtime) She is getting this OTC. She was on R insulin >> fluctuations. She was on NovoLog pens. She was on Metformin before >> severe diarrhea >> was off work. She would not want to retry even an extended-release formulation.  Pt checks her sugars 2-3x a day and they are: - am: 60-200 - 2h after b'fast: n/c - before lunch: n/c - 2h after lunch: n/c - before dinner: 150-170 - 2h after dinner: n/c - bedtime: 70-130, 200 - nighttime: n/c + lows. Lowest sugar was 43; she has hypoglycemia awareness at 60.  Highest sugar was 400 (Thanksgiving).   Glucometer: ReliOn  Pt's meals are vegetarian; she was raw vegan at one point! - Breakfast: banana + oatmeal - Lunch: salad - Dinner: salad, pasta  - no CKD, last BUN/creatinine:  Lab Results  Component Value Date   BUN 20 05/10/2015   CREATININE 0.88 05/10/2015  On Lisinopril. - last set of lipids: Lab Results  Component Value Date   CHOL 198 05/10/2015   HDL 53.70 05/10/2015   LDLCALC 128* 05/10/2015   TRIG 86.0 05/10/2015   CHOLHDL 4 05/10/2015  On Lipitor. - last eye exam was in 05/2015. + Proliferative Sherry, per her report.  - she had numbness and tingling in her feet. She was on Neurontin, now off, as her PN pain  resolved.  Pt has FH of DM in mother.  She also has a history of HL, HTN, kidney stones, depression, cataracts.  ROS: Constitutional: + weight gain and loss, + fatigue, + poor sleep, + nocturia Eyes: no blurry vision, no xerophthalmia ENT: +sore throat, no nodules palpated in throat, no dysphagia/odynophagia, no hoarseness, + tinnitus, + hypoacusis Cardiovascular: + CP/no SOB/palpitations/leg swelling Respiratory: no cough/+ SOB Gastrointestinal: no N/V/+ D/no C/heartburn Musculoskeletal: no muscle aches/joint aches Skin: no rashes, no hair loss Neurological: no tremors/numbness/tingling/dizziness, no HA Psychiatric: + depression/no anxiety + low libido  Past Medical History  Diagnosis Date  . CAD (coronary artery disease)     a. PCI [place] 2004. b. Angina - balloon angioplasty to LAD 04/21/12 (vessel too small for stent).  . Refusal of blood transfusions as patient is Jehovah's Witness   . HLD (hyperlipidemia)     a. takes Lipitor daily  . Asthma   . Migraine aura without headache     a. visual changes  . Chronic lower back pain   . Anxiety     a. with claustrophobia  . Carotid stenosis     a. 11/2011 critical R, ~30% L by angiogram;  b. 04/2012 s/p R CEA.  Marland Kitchen Hypertension, accelerated   . Peripheral neuropathy (HCC)   . Embolic stroke (HCC)     a. 11/2011 MRI: mult small b/l infarcts c/w emboli;  b. 11/2011 MRA neck: high grade RICA  stenosis;  c. 6/20013 TEE: EF 55-60% with mild LVH, severe MAC, < 1 cm calc mobile structure on the MV annulus, ? 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 Thought was RICA stenosis did not explain the b/l small strokes; concern for emboli from mobile MV struct;  d. 01/2012 Event Mon:  no AFib  . History of kidney stones   . Diabetes mellitus type 2, uncontrolled (HCC)   . Cataract     left  . Depression     a. occasionally takes st john's wart  . Arthritis     "right hand; maybe just starting in my left hand" (04/21/2012)   Past  Surgical History  Procedure Laterality Date  . Kidney stone surgery      several times  . Carpal tunnel release  ~ 2005    bilateral  . Cataract extraction w/ intraocular lens implant  2012    right  . Tee without cardioversion  12/14/2011    Procedure: TRANSESOPHAGEAL ECHOCARDIOGRAM (TEE);  Surgeon: Sherry Morale, MD;  Location: [facility] ENDOSCOPY;  Service: Cardiovascular;  Laterality: N/A;  . Ankle surgery  2005    after fall down stairs, left with screws and plate in place  . Holter monitor  2013    WNL  . US echocardiography  2002    normal LV fxn, mild LVH, borderline L atrial size  . Lithotripsy    . Coronary angioplasty with stent placement  2002    x2  . Cardiac catheterization  2004    WNL, no significant coronary disease  . Cardiac catheterization  12/19/11  . Coronary angioplasty  04/21/2012    95% stenosis mid LAD, vessel not large enough for stent, rec plavix/ASA for 1 mo  . Endarterectomy  05/09/2012    Procedure: ENDARTERECTOMY CAROTID;  Surgeon: Sherry Libman, MD;  Location: [facility] OR;  Service: Vascular;  Laterality: Right;  Right carotd endarterectomy with bovine patch angioplasty  . Carotid endarterectomy    . Carotid angiogram Bilateral 12/19/2011    Procedure: CAROTID ANGIOGRAM;  Surgeon: Sherry Libman, MD;  Location: [facility] CATH LAB;  Service: Cardiovascular;  Laterality: Bilateral;  . Percutaneous coronary stent intervention (pci-s) N/A 04/21/2012    Procedure: PERCUTANEOUS CORONARY STENT INTERVENTION (PCI-S);  Surgeon: Sherry Hazel, MD;  Location: [facility] CATH LAB;  Service: Cardiovascular;  Laterality: N/A;  . Pars plana vitrectomy Left 04/2015    [facility] [name])   Social History   Social History  . Marital Status: Married    Spouse Name: N/A  . Number of Children: 4   Occupational History  .    Social History Main Topics  . Smoking status: Former Smoker -- 1.00 packs/day for 3 years    Types: Cigarettes    Quit date: 06/26/1975  . Smokeless  tobacco: Never Used  . Alcohol Use: No     Comment: none since 11/2011  . Drug Use: No  . Sexual Activity: Yes    Birth Control/ Protection: Post-menopausal   Social History Narrative   Caffeine: 2 cups/day coffee   Lives alone, husband in [place].  4 children nearby, son to live with her.   Occupation: unemployed, prior worked as IT consultant   Activity: stays active on farm   Diet: vegan, no processed foods, good water daily, fruits/vegetables daily   jehovah's witness - no blood products.   Current Outpatient Prescriptions on File Prior to Visit  Medication Sig Dispense Refill  . aspirin (ASPIRIN EC) 81  MG EC tablet Take 81 mg by mouth daily. Swallow whole.    Marland Kitchen atorvastatin (LIPITOR) 40 MG tablet Take 1 tablet (40 mg total) by mouth daily. 30 tablet 11  . bisoprolol (ZEBETA) 5 MG tablet Take 1 tablet (5 mg total) by mouth daily. 30 tablet 11  . insulin NPH (HUMULIN N,NOVOLIN N) 100 UNIT/ML injection Inject 10-40 Units into the skin at bedtime. SLIDING SCALE    . lisinopril (PRINIVIL,ZESTRIL) 20 MG tablet Take 1 tablet (20 mg total) by mouth daily. 90 tablet 3  . nitroGLYCERIN (NITROSTAT) 0.4 MG SL tablet Place 0.4 mg under the tongue every 5 (five) minutes as needed. For chest pain     No current facility-administered medications on file prior to visit.   Allergies  Allergen Reactions  . Isosorbide Other (See Comments)    headaches  . Metformin And Related Diarrhea and Nausea And Vomiting  . Penicillins Itching and Other (See Comments)    From when younger  . Toradol [Ketorolac Tromethamine] Other (See Comments)    Panic attack   . Other     No blood or blood products   Family History  Problem Relation Age of Onset  . Heart disease Mother     valve problems  . Stroke Mother   . Hypertension Mother   . Heart disease Sister     valve problems  . Heart disease Brother   . Diabetes Father   . Hypertension Father   . Cancer Neg Hx    PE: BP 112/80 mmHg  Pulse 70   Temp(Src) 98.6 F (37 C) (Oral)  Resp 12  Ht 5' 6.5" (1.689 m)  Wt 200 lb (90.719 kg)  BMI 31.80 kg/m2  SpO2 95% Wt Readings from Last 3 Encounters:  06/03/15 200 lb (90.719 kg)  05/30/15 196 lb (88.905 kg)  05/26/15 195 lb 6.4 oz (88.633 kg)   Constitutional: overweight, truncal distribution, in NAD Eyes: PERRLA, EOMI, no exophthalmos ENT: moist mucous membranes, no thyromegaly, no cervical lymphadenopathy Cardiovascular: RRR, No MRG Respiratory: CTA B Gastrointestinal: abdomen soft, NT, ND, BS+ Musculoskeletal: no deformities, strength intact in all 4 Skin: moist, warm, no rashes Neurological: no tremor with outstretched hands, DTR normal in all 4  ASSESSMENT: 1. DM2, insulin-dependent, uncontrolled, with complications - CAD, s/p PCI x2 2004 - followed by Sherry. Shirlee Latch - Severe right carotid stenosis, s/p CEA - Cerebrovascular disease - MRI showing multiple small bilateral infarcts; also s/p embolic stroke - Proliferative Sherry - PN  PLAN:  1. Patient with long-standing, uncontrolled diabetes, with many diabetes complications, on intermediate acting insulin regimen, which became insufficient. Since she had diabetes for such a long time, I believe that she definitely needs basal insulin, and she may need mealtime insulin, also. However, for now, we discussed about using a long-acting, rather than an intermediate acting insulin >> switch from NPH to Lantus. I explained the differences between the 2 insulins. She agrees with this. I would also like to try whether she responds to a DPP 4 inhibitor or, if not, a GLP-1 receptor agonist, instead of directly starting mealtime insulin. I briefly discussed with her about the possibility of using of VGo mechanical pump, but she definitely refuses this. So, for now I suggested to try Alogliptin, which is now generic and should be covered by [employer]. At next visit, we can consider Victoza or Trulicity, if needed. - I suggested to:  Patient Instructions   Please stop NPH.  Lantus 30 units at bedtime. If sugars  in am are not <150 in am >> increase to 35 units.  Please start Alogliptin 25 mg in am, before breakfast.  Please return in 1.5 months with your sugar log.   Please let me know if the sugars are consistently <80 or >200.  - Strongly advised her to start checking sugars at different times of the day - check 2 times a day, rotating checks - given sugar log and advised how to fill it and to bring it at next appt  - given foot care handout and explained the principles  - given instructions for hypoglycemia management "15-15 rule"  - advised for yearly eye exams - She is up-to-date - Return to clinic in 1.5 mo with sugar log   CC:  Sherry Shirlee Latch

## 2015-06-07 ENCOUNTER — Telehealth (HOSPITAL_COMMUNITY): Payer: Self-pay | Admitting: *Deleted

## 2015-06-07 ENCOUNTER — Telehealth: Payer: Self-pay | Admitting: Internal Medicine

## 2015-06-07 ENCOUNTER — Ambulatory Visit: Payer: Managed Care, Other (non HMO) | Admitting: Family Medicine

## 2015-06-07 MED ORDER — ALOGLIPTIN BENZOATE 25 MG PO TABS
ORAL_TABLET | ORAL | Status: DC
Start: 2015-06-07 — End: 2015-11-23

## 2015-06-07 NOTE — Telephone Encounter (Signed)
Please call the pt back regarding her questions about her medication regimen

## 2015-06-07 NOTE — Telephone Encounter (Signed)
Returned pt's call. Pt stated that [facility] did not have the new medication Alogliptin. Asked her if she would like for [place] to send it to [facility] and see if they have it in stock. She advised ok to do this. Pt will let us know if they do not have it so Dr Elvera Lennox can make any changes.

## 2015-06-07 NOTE — Telephone Encounter (Signed)
Patient given detailed instructions per Myocardial Perfusion Study Information Sheet for the test on 06/09/15 at 0945. Patient notified to arrive 15 minutes early and that it is imperative to arrive on time for appointment to keep from having the test rescheduled.  If you need to cancel or reschedule your appointment, please call the office within 24 hours of your appointment. Failure to do so may result in a cancellation of your appointment, and a $50 no show fee. Patient verbalized understanding.Lexi Conaty, Adelene Idler

## 2015-06-09 ENCOUNTER — Other Ambulatory Visit: Payer: Self-pay

## 2015-06-09 ENCOUNTER — Encounter: Payer: Self-pay | Admitting: Physician Assistant

## 2015-06-09 ENCOUNTER — Ambulatory Visit (HOSPITAL_COMMUNITY): Payer: Managed Care, Other (non HMO) | Attending: Cardiology

## 2015-06-09 ENCOUNTER — Ambulatory Visit (HOSPITAL_BASED_OUTPATIENT_CLINIC_OR_DEPARTMENT_OTHER): Payer: Managed Care, Other (non HMO)

## 2015-06-09 DIAGNOSIS — I517 Cardiomegaly: Secondary | ICD-10-CM | POA: Diagnosis not present

## 2015-06-09 DIAGNOSIS — I340 Nonrheumatic mitral (valve) insufficiency: Secondary | ICD-10-CM | POA: Diagnosis not present

## 2015-06-09 DIAGNOSIS — Z01810 Encounter for preprocedural cardiovascular examination: Secondary | ICD-10-CM

## 2015-06-09 DIAGNOSIS — I779 Disorder of arteries and arterioles, unspecified: Secondary | ICD-10-CM | POA: Diagnosis not present

## 2015-06-09 DIAGNOSIS — I351 Nonrheumatic aortic (valve) insufficiency: Secondary | ICD-10-CM | POA: Insufficient documentation

## 2015-06-09 DIAGNOSIS — R011 Cardiac murmur, unspecified: Secondary | ICD-10-CM | POA: Diagnosis not present

## 2015-06-09 DIAGNOSIS — F17200 Nicotine dependence, unspecified, uncomplicated: Secondary | ICD-10-CM | POA: Insufficient documentation

## 2015-06-09 DIAGNOSIS — E785 Hyperlipidemia, unspecified: Secondary | ICD-10-CM | POA: Diagnosis not present

## 2015-06-09 DIAGNOSIS — E119 Type 2 diabetes mellitus without complications: Secondary | ICD-10-CM | POA: Insufficient documentation

## 2015-06-09 DIAGNOSIS — R0609 Other forms of dyspnea: Secondary | ICD-10-CM | POA: Diagnosis not present

## 2015-06-09 DIAGNOSIS — R0602 Shortness of breath: Secondary | ICD-10-CM | POA: Insufficient documentation

## 2015-06-09 DIAGNOSIS — I5189 Other ill-defined heart diseases: Secondary | ICD-10-CM | POA: Diagnosis not present

## 2015-06-09 DIAGNOSIS — I10 Essential (primary) hypertension: Secondary | ICD-10-CM | POA: Diagnosis not present

## 2015-06-09 DIAGNOSIS — I2510 Atherosclerotic heart disease of native coronary artery without angina pectoris: Secondary | ICD-10-CM | POA: Diagnosis not present

## 2015-06-09 DIAGNOSIS — Z8673 Personal history of transient ischemic attack (TIA), and cerebral infarction without residual deficits: Secondary | ICD-10-CM | POA: Diagnosis not present

## 2015-06-09 LAB — MYOCARDIAL PERFUSION IMAGING
CSEPPHR: 78 {beats}/min
LV dias vol: 109 mL
LV sys vol: 59 mL
NUC STRESS TID: 1.13
RATE: 0.3
Rest HR: 60 {beats}/min
SDS: 3
SRS: 3
SSS: 6

## 2015-06-09 MED ORDER — REGADENOSON 0.4 MG/5ML IV SOLN
0.4000 mg | Freq: Once | INTRAVENOUS | Status: AC
  Administered 2015-06-09: 0.4 mg via INTRAVENOUS

## 2015-06-09 MED ORDER — TECHNETIUM TC 99M SESTAMIBI GENERIC - CARDIOLITE
10.8000 | Freq: Once | INTRAVENOUS | Status: AC | PRN
  Administered 2015-06-09: 11 via INTRAVENOUS

## 2015-06-09 MED ORDER — TECHNETIUM TC 99M SESTAMIBI GENERIC - CARDIOLITE
32.9000 | Freq: Once | INTRAVENOUS | Status: AC | PRN
  Administered 2015-06-09: 32.9 via INTRAVENOUS

## 2015-06-10 ENCOUNTER — Encounter: Payer: Self-pay | Admitting: Physician Assistant

## 2015-06-12 NOTE — Progress Notes (Signed)
Cardiology Office Note    Date:  06/13/2015   ID:  [name], DOB 06/30/1955, MRN [id number]  PCP:  Eustaquio Boyden, MD  Cardiologist:  Dr. [name]   Electrophysiologist:  n/a  Chief Complaint  Patient presents with  . Follow-up    HTN  . Coronary Artery Disease    History of Present Illness:  [name] is a 59 y.o. female with a hx of CAD status post PCI in [place] in 2004, diabetes, prior CVA. She was admitted 6/13 with confusion and memory deficits. MRI demonstrated multiple small bilateral infarcts. She had severe right ICA stenosis but this did not explain the bilateral infarcts. TEE demonstrated mobile calcified structure attached to the mitral valve with heavy mitral annular calcification. 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 It was thought that the structure could be a potential source of embolism and Plavix was started. Event monitor demonstrated no atrial fibrillation. LHC in 10/13 demonstrated 95% mid LAD stenosis which was treated with angioplasty (vessel was not large enough for stent). She underwent right CEA 11/13. Last seen by Dr. Shirlee Latch 3/14. Continued Plavix therapy was recommended.  I saw her 05/26/15 for a FU visit.  She had been admitted to [facility] in 05/2014 with TIA versus CVA.  Discharge summary indicated MRI was positive for CVA and carotid US demonstrated LICA 70-99% stenosis.  She had also had a recent laser surgery on her eye for vitreous hemorrhage.  I reviewed with her ophthalmologist and had her resume Plavix in addition to ASA (she had been off of Plavix for about a year).  BP was running high and I adjusted her medications.  I set her up for a stress test and an echo.  Nuclear study demonstrated no ischemia but her EF 46%. Echo demonstrated normal LVF, mod diastolic dysfunction, mild MR.  I referred her back to [facility]. Carotid US demonstrated critical L ICA stenosis and she needs L CEA with Dr. Myra Gianotti.      Returns for FU.  She is doing well. Denies chest pain, shortness of breath, syncope, orthopnea, PND or edema.     Past Medical History  Diagnosis Date  . CAD (coronary artery disease)     a. PCI [place] 2004. b. Angina - balloon angioplasty to LAD 04/21/12 (vessel too small for stent).  . Refusal of blood transfusions as patient is Jehovah's Witness   . HLD (hyperlipidemia)     a. takes Lipitor daily  . Asthma   . Migraine aura without headache     a. visual changes  . Chronic lower back pain   . Anxiety     a. with claustrophobia  . Carotid stenosis     a. 11/2011 critical R, ~30% L by angiogram;  b. 04/2012 s/p R CEA.  Marland Kitchen Hypertension, accelerated   . Peripheral neuropathy (HCC)   . Embolic stroke (HCC)     a. 11/2011 MRI: mult small b/l infarcts c/w emboli;  b. 11/2011 MRA neck: high grade RICA stenosis;  c. 6/20013 TEE: EF 55-60% with mild LVH, severe MAC, < 1 cm calc mobile structure on the MV annulus, ? 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 Thought was RICA stenosis did not explain the b/l small strokes; concern for emboli from mobile MV struct;  d. 01/2012 Event Mon:  no AFib  . History of kidney stones   . Diabetes mellitus type 2, uncontrolled (HCC)   .  Cataract     left  . Depression     a. occasionally takes [place] john's wart  . Arthritis     "right hand; maybe just starting in my left hand" (04/21/2012)  . History of echocardiogram     Echo 12/16: mild LVH, EF 55-60%, no RWMA, Gr 2 DD, trivial AI, MAC, mild MR, mild LAE  . History of nuclear stress test     a. Myoview 12/16: EF 46%, no ischemia, Intermediate Risk (low EF)  1. Diabetes mellitus  2. Hyperlipidemia 3. HTN 4. CVA: 6/13 admission with confusion and memory problems. MRI showed multiple small bilateral infarcts c/w emboli. MRA neck showed high grade RICA stenosis. 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 It was thought that the RICA stenosis could not explain the bilateral small strokes and concern was for emboli from the mobile calcified mitral annular structure. 3-week event monitor (8/13) showed no atrial fibrillation. Right CEA in 11/13.  5. Carotid stenosis: 85-90% RICA on 6/13 cerebral angiogram.  6. CAD s/p PCI x 2 in 2004 in [place]. Lexiscan myoview (7/13) showed small basal inferior scar with no ischemia, EF 53%, inferobasal hypokinesis. LHC (10/13) with 95% mLAD stenosis. The vessel was small caliber, she had PTCA only.  7. Asthma   Past Surgical History  Procedure Laterality Date  . Kidney stone surgery      several times  . Carpal tunnel release  ~ 2005    bilateral  . Cataract extraction w/ intraocular lens implant  2012    right  . Tee without cardioversion  12/14/2011    Procedure: TRANSESOPHAGEAL ECHOCARDIOGRAM (TEE);  Surgeon: Laurey Morale, MD;  Location: [facility] ENDOSCOPY;  Service: Cardiovascular;  Laterality: N/A;  . Ankle surgery  2005    after fall down stairs, left with screws and plate in place  . Holter monitor  2013    WNL  . US echocardiography  2002    normal LV fxn, mild LVH, borderline L atrial size  . Lithotripsy    . Coronary angioplasty with stent placement  2002    x2  . Cardiac catheterization  2004    WNL, no significant coronary disease  . Cardiac catheterization  12/19/11  . Coronary angioplasty  04/21/2012    95% stenosis mid LAD, vessel not large enough for stent, rec plavix/ASA for 1 mo  . Endarterectomy  05/09/2012    Procedure: ENDARTERECTOMY CAROTID;  Surgeon: Nada Libman, MD;  Location: [facility] OR;  Service: Vascular;  Laterality: Right;  Right carotd endarterectomy with bovine patch angioplasty  . Carotid endarterectomy    . Carotid angiogram Bilateral 12/19/2011    Procedure: CAROTID ANGIOGRAM;  Surgeon: Nada Libman, MD;  Location: [facility] CATH LAB;  Service: Cardiovascular;  Laterality:  Bilateral;  . Percutaneous coronary stent intervention (pci-s) N/A 04/21/2012    Procedure: PERCUTANEOUS CORONARY STENT INTERVENTION (PCI-S);  Surgeon: Kathleene Hazel, MD;  Location: [facility] CATH LAB;  Service: Cardiovascular;  Laterality: N/A;  . Pars plana vitrectomy Left 04/2015    [facility] Dr Chaney Born)    Current Outpatient Prescriptions  Medication Sig Dispense Refill  . Alogliptin Benzoate 25 MG TABS Take 1 tablet daily in am - GENERIC 30 tablet 2  . aspirin (ASPIRIN EC) 81 MG EC tablet Take 81 mg by mouth daily. Swallow whole.    Marland Kitchen atorvastatin (  LIPITOR) 40 MG tablet Take 1 tablet (40 mg total) by mouth daily. 30 tablet 11  . bisoprolol (ZEBETA) 5 MG tablet Take 1 tablet (5 mg total) by mouth daily. 30 tablet 11  . Insulin Glargine (LANTUS SOLOSTAR) 100 UNIT/ML Solostar Pen Inject 30 Units into the skin daily at 10 pm. 5 pen 2  . insulin NPH (HUMULIN N,NOVOLIN N) 100 UNIT/ML injection Inject 10-40 Units into the skin at bedtime. SLIDING SCALE    . Insulin Pen Needle (BD PEN NEEDLE NANO U/F) 32G X 4 MM MISC Use 1x a dau 100 each 11  . lisinopril (PRINIVIL,ZESTRIL) 20 MG tablet Take 1 tablet (20 mg total) by mouth daily. 90 tablet 3  . nitroGLYCERIN (NITROSTAT) 0.4 MG SL tablet Place 0.4 mg under the tongue every 5 (five) minutes as needed. For chest pain    . clopidogrel (PLAVIX) 75 MG tablet Take 1 tablet (75 mg total) by mouth daily. 30 tablet 11   No current facility-administered medications for this visit.    Allergies:   Isosorbide; Metformin and related; Penicillins; Toradol; and Other   Social History   Social History  . Marital Status: Married    Spouse Name: N/A  . Number of Children: N/A  . Years of Education: N/A   Social History Main Topics  . Smoking status: Former Smoker -- 1.00 packs/day for 3 years    Types: Cigarettes    Quit date: 06/26/1975  . Smokeless tobacco: Never Used  . Alcohol Use: No     Comment: none since 11/2011  . Drug Use: No  . Sexual  Activity: Yes    Birth Control/ Protection: Post-menopausal   Other Topics Concern  . None   Social History Narrative   Caffeine: 2 cups/day coffee   Lives alone, husband in [place].  4 children nearby, son to live with her.   Occupation: unemployed, prior worked as IT consultant   Activity: stays active on farm   Diet: vegan, no processed foods, good water daily, fruits/vegetables daily   jehovah's witness - no blood products.     Family History:  The patient's family history includes Diabetes in her father; Heart disease in her brother, mother, and sister; Hypertension in her father and mother; Stroke in her mother. There is no history of Cancer.   ROS:   Please see the history of present illness.    ROS All other systems reviewed and are negative.   PHYSICAL EXAM:   VS:  BP 178/98 mmHg  Pulse 76  Ht  (1.676 m)   GEN: Well nourished, well developed, in no acute distress HEENT: normal Neck: no JVD at 90 Cardiac: RRR; no murmurs, rubs, or gallops,no edema  Respiratory:  clear to auscultation bilaterally, normal work of breathing GI: soft, nontender, nondistended MS: no deformity or atrophy Skin: warm and dry, no rash Neuro:  Alert and Oriented x 3  Psych: euthymic mood   Wt Readings from Last 3 Encounters:  06/09/15 195 lb (88.451 kg)  06/03/15 200 lb (90.719 kg)  05/30/15 196 lb (88.905 kg)      Studies/Labs Reviewed:   EKG:  EKG is not ordered today.  The ekg ordered today demonstrates n/a  Recent Labs: 05/10/2015: ALT 16; BUN 20; Creatinine, Ser 0.88; Potassium 4.6; Sodium 139; TSH 1.22   Lipid Panel    Component Value Date/Time   CHOL 198 05/10/2015 1211   CHOL 220* 05/24/2014 0038   TRIG 86.0 05/10/2015 1211   TRIG 164 05/24/2014  0038   HDL 53.70 05/10/2015 1211   HDL 51 05/24/2014 0038   CHOLHDL 4 05/10/2015 1211   VLDL 17.2 05/10/2015 1211   VLDL 33 05/24/2014 0038   LDLCALC 128* 05/10/2015 1211   LDLCALC 136* 05/24/2014 0038    Additional  studies/ records that were reviewed today include:  Myoview 06/09/15 No evidence of ischemia by perfusion imaging.  EF 46%.  Intermediate risk  Echo 06/09/15 Mild LVH, EF 55-60%, normal wall motion, grade 2 diastolic dysfunction, MAC, mild MR, mild LAE  Carotid US 05/30/15 L 80-99%; R CEA patent  LHC 03/2012 LM: Minimal disease LAD: Proximal 30%, septal perforator severely diseased, mid LAD 95% LCx: Luminal irregularities RCA: Ostial 50%, proximal stent patent with 40-50% prior to stent Final Conclusions: 95% mid LAD stenosis is the likely cause of patient's symptoms. Reload 300 mg Plavix. Dr. Clifton James will intervene today. I spoke with Dr. Myra Gianotti, will delay carotid endarterectomy that had been scheduled for Thursday.  PCI: Successful PTCA with balloon angioplasty only of the distal LAD. (vessel not large enough for a stent).   Myoview 8/13 Low risk, mild inferior scar, no ischemia, EF 53%  Echo 6/13 Mild LVH, EF 55-60%, normal wall motion, MAC, <1 cm calcified mobile nodule attached mitral valve annulus, Mild LAE, normal RV function   ASSESSMENT:    1. Pre-operative cardiovascular examination   2. Coronary artery disease involving native coronary artery of native heart without angina pectoris   3. H/O: CVA (cerebrovascular accident)   4. Essential hypertension   5. Hyperlipidemia   6. Carotid stenosis, bilateral   7. 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 Surgical Clearance - The patient does not have any unstable cardiac conditions.  Recent Myoview was neg for ischemia and EF was normal on echo.  Upon evaluation today, she can achieve 4 METs or greater without anginal symptoms.  According to [facility] and AHA guidelines, she requires no further cardiac workup prior to her noncardiac surgery and  should be at acceptable risk.  Our service is available as necessary in the perioperative period.    2. CAD: Hx of RCA stenting in [place] in 2004 and PTCA only to LAD in 2013. Recent Myoview neg for ischemia.  She denies angina.  Continue ASA, statin, beta blocker.  3. Hx of CVA: Multiple bilateral small infarcts found in 6/13. This was not explained by RICA stenosis. It was felt that she may have had emboli from the mobile structure on the mitral valve annulus. 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 Blood cultures were negative. 3 week event monitor in 8/13 did not show any atrial fibrillation.  She should be on aspirin plus Plavix. Currently she is only taking aspirin. I have asked her to start Plavix 75 mg daily after her carotid endarterectomy when cleared by vascular surgery.    4. HTN: Blood pressure uncontrolled. She has not taken any medications at today. She tells me that her blood pressures at home have typically been optimal. I have asked her to continue to monitor her blood pressures.  5. Hyperlipidemia:  Statin recent resumed. FU Lipids pending.    6. Carotid Stenosis: S/p R CEA. L CEA with Dr.  Brabham is pending.   7.Diabetes Mellitus: Recent A1c 8.3. FU with PCP for aggressive glucose control given CAD, vascular disease and diabetic retinopathy.    Medication Adjustments/Labs and Tests Ordered: Current medicines are reviewed at length with the patient today.  Concerns regarding medicines are outlined above.  Medication changes, Labs and Tests ordered today are listed below. Patient Instructions  Medication Instructions:  DISCUSS WITH DR. Myra Gianotti; WHEN AFTER YOUR SURGERY  WILL BE IT OK FOR YOU TO START PLAVIX 75 MG DAILY. WE HAVE SENT IN A PRESCRIPTION TODAY FOR YOU  Labwork: NONE  Testing/Procedures: NONE  Follow-Up: 3-4 MONTHS WITH DR. Shirlee Latch; I  WILL HAVE DR. MCLEAN'S NURSE CALL YOU WITH AN APPT  Any Other Special Instructions Will Be Listed Below (If Applicable).   If you need a refill on your cardiac medications before your next appointment, please call your pharmacy.        Signed, Tereso Newcomer, PA-C  06/13/2015 3:31 PM    [facility] [address] [place], [place], [place]  [address] Phone: [phone]; Fax: [phone]

## 2015-06-13 ENCOUNTER — Encounter: Payer: Self-pay | Admitting: Physician Assistant

## 2015-06-13 ENCOUNTER — Telehealth (HOSPITAL_COMMUNITY): Payer: Self-pay | Admitting: *Deleted

## 2015-06-13 ENCOUNTER — Ambulatory Visit (INDEPENDENT_AMBULATORY_CARE_PROVIDER_SITE_OTHER): Payer: Managed Care, Other (non HMO) | Admitting: Physician Assistant

## 2015-06-13 ENCOUNTER — Encounter: Payer: Self-pay | Admitting: Surgery

## 2015-06-13 VITALS — BP 178/98 | HR 76 | Ht 66.0 in

## 2015-06-13 DIAGNOSIS — I2510 Atherosclerotic heart disease of native coronary artery without angina pectoris: Secondary | ICD-10-CM

## 2015-06-13 DIAGNOSIS — Z01810 Encounter for preprocedural cardiovascular examination: Secondary | ICD-10-CM

## 2015-06-13 DIAGNOSIS — I6523 Occlusion and stenosis of bilateral carotid arteries: Secondary | ICD-10-CM

## 2015-06-13 DIAGNOSIS — Z8673 Personal history of transient ischemic attack (TIA), and cerebral infarction without residual deficits: Secondary | ICD-10-CM | POA: Diagnosis not present

## 2015-06-13 DIAGNOSIS — I10 Essential (primary) hypertension: Secondary | ICD-10-CM | POA: Diagnosis not present

## 2015-06-13 DIAGNOSIS — E785 Hyperlipidemia, unspecified: Secondary | ICD-10-CM

## 2015-06-13 DIAGNOSIS — E11319 Type 2 diabetes mellitus with unspecified diabetic retinopathy without macular edema: Secondary | ICD-10-CM

## 2015-06-13 DIAGNOSIS — Z794 Long term (current) use of insulin: Secondary | ICD-10-CM

## 2015-06-13 MED ORDER — CLOPIDOGREL BISULFATE 75 MG PO TABS: 75.0000 mg | ORAL_TABLET | Freq: Every day | ORAL | Status: DC

## 2015-06-13 NOTE — Telephone Encounter (Signed)
Received form from [facility] pt needs clearance for L CEA w/Dr Myra Gianotti, per Dr Shirlee Latch "recent cardiolite w/o ischemia, pt ok to proceed, low risk.  Form faxed back to them at [phone]

## 2015-06-13 NOTE — Patient Instructions (Signed)
Medication Instructions:  DISCUSS WITH DR. Myra Gianotti; WHEN AFTER YOUR SURGERY  WILL BE IT OK FOR YOU TO START PLAVIX 75 MG DAILY. WE HAVE SENT IN A PRESCRIPTION TODAY FOR YOU  Labwork: NONE  Testing/Procedures: NONE  Follow-Up: 3-4 MONTHS WITH DR. Shirlee Latch; I WILL HAVE DR. MCLEAN'S NURSE CALL YOU WITH AN APPT  Any Other Special Instructions Will Be Listed Below (If Applicable).   If you need a refill on your cardiac medications before your next appointment, please call your pharmacy.

## 2015-06-15 NOTE — Pre-Procedure Instructions (Signed)
[name]  06/15/2015      [facility] [address] [address], [place] - [address] AT [facility] [address] [place] [address] Phone: [phone] Fax: [phone]  [facility] [address] [place] - [address] [place] [address] Phone: [phone] Fax: [phone]    Your procedure is scheduled on Thursday, June 23, 2015  Report to [facility] Admitting at 5:30 A.M.  Call this number if you have problems the morning of surgery:  [phone]   Remember:  Do not eat food or drink liquids after midnight Wednesday, June 22, 2015  Take these medicines the morning of surgery with A SIP OF WATER : if needed: nitroGLYCERIN (NITROSTAT) for chest pain.  Do not take any oral diabetes medicines ( pills) the morning of surgery such as Alogliptin Benzoate   Stop taking vitamins, fish oil and herbal medications such as Tumeric. Do not take any NSAIDs ie: Ibuprofen, Advil, Naproxen, BC's or Goody Powder; stop now.  How to Manage Your Diabetes Before Surgery Why is it important to control my blood sugar before and after surgery?   Improving blood sugar levels before and after surgery helps healing and can limit problems.  A way of improving blood sugar control is eating a healthy diet by:  - Eating less sugar and carbohydrates  - Increasing activity/exercise  - Talk with your doctor about reaching your blood sugar goals  High blood sugars (greater than 180 mg/dL) can raise your risk of infections and slow down your recovery so you will need to focus on controlling your diabetes during the weeks before surgery.  Make sure that the doctor who takes care of your diabetes knows about your planned surgery including the date and location.  How do I manage my blood sugars before surgery?   Check your blood sugar at least 4 times a day, 2 days before surgery to make sure that they are not  too high or low.   Check your blood sugar the morning of your surgery when you wake up and every 2 hours until you get to the Short-Stay unit.  If your blood sugar is less than 70 mg/dL, you will need to treat for low blood sugar by:  Treat a low blood sugar (less than 70 mg/dL) with 1/2 cup of clear juice (cranberry or apple), 4 glucose tablets, OR glucose gel.  Recheck blood sugar in 15 minutes after treatment (to make sure it is greater than 70 mg/dL).  If blood sugar is not greater than 70 mg/dL on re-check, call [phone] for further instructions.   Report your blood sugar to the Short-Stay nurse when you get to Short-Stay.  References:  [facility], 2007 "How to Manage your Diabetes Before and After Surgery".  What do I do about my diabetes medications?  THE NIGHT BEFORE SURGERY, take 24 units of  Insulin Glargine (LANTUS)     Do not wear jewelry, make-up or nail polish.  Do not wear lotions, powders, or perfumes.  You may not wear deodorant.  Do not shave 48 hours prior to surgery.    Do not bring valuables to the hospital.  [facility] is not responsible for any belongings or valuables.  Contacts, dentures or bridgework may not be worn into surgery.  Leave your suitcase in the car.  After surgery it may be brought to your room.  For  patients admitted to the hospital, discharge time will be determined by your treatment team.  Patients discharged the day of surgery will not be allowed to drive home.   Name and phone number of your driver:  Special instructions: Shower the night before surgery and the morning of surgery with CHG.  Please read over the following fact sheets that you were given. Pain Booklet, Coughing and Deep Breathing, Blood Transfusion Information, MRSA Information and Surgical Site Infection Prevention

## 2015-06-16 ENCOUNTER — Encounter (HOSPITAL_COMMUNITY): Payer: Self-pay

## 2015-06-16 ENCOUNTER — Encounter (HOSPITAL_COMMUNITY)
Admission: RE | Admit: 2015-06-16 | Discharge: 2015-06-16 | Disposition: A | Discharge status: Home / Self Care | Payer: Managed Care, Other (non HMO) | Source: Ambulatory Visit | Attending: Surgery | Admitting: Surgery

## 2015-06-16 ENCOUNTER — Telehealth: Payer: Self-pay | Admitting: *Deleted

## 2015-06-16 DIAGNOSIS — Z955 Presence of coronary angioplasty implant and graft: Secondary | ICD-10-CM | POA: Insufficient documentation

## 2015-06-16 DIAGNOSIS — N390 Urinary tract infection, site not specified: Secondary | ICD-10-CM

## 2015-06-16 DIAGNOSIS — Z01818 Encounter for other preprocedural examination: Secondary | ICD-10-CM | POA: Diagnosis not present

## 2015-06-16 DIAGNOSIS — Z01812 Encounter for preprocedural laboratory examination: Secondary | ICD-10-CM | POA: Diagnosis not present

## 2015-06-16 DIAGNOSIS — Z79899 Other long term (current) drug therapy: Secondary | ICD-10-CM | POA: Insufficient documentation

## 2015-06-16 DIAGNOSIS — Z794 Long term (current) use of insulin: Secondary | ICD-10-CM | POA: Diagnosis not present

## 2015-06-16 DIAGNOSIS — Z87891 Personal history of nicotine dependence: Secondary | ICD-10-CM | POA: Diagnosis not present

## 2015-06-16 DIAGNOSIS — I2510 Atherosclerotic heart disease of native coronary artery without angina pectoris: Secondary | ICD-10-CM | POA: Insufficient documentation

## 2015-06-16 DIAGNOSIS — E1142 Type 2 diabetes mellitus with diabetic polyneuropathy: Secondary | ICD-10-CM | POA: Diagnosis not present

## 2015-06-16 DIAGNOSIS — I10 Essential (primary) hypertension: Secondary | ICD-10-CM | POA: Insufficient documentation

## 2015-06-16 DIAGNOSIS — Z7982 Long term (current) use of aspirin: Secondary | ICD-10-CM | POA: Diagnosis not present

## 2015-06-16 DIAGNOSIS — I6522 Occlusion and stenosis of left carotid artery: Secondary | ICD-10-CM | POA: Diagnosis not present

## 2015-06-16 DIAGNOSIS — J45909 Unspecified asthma, uncomplicated: Secondary | ICD-10-CM | POA: Insufficient documentation

## 2015-06-16 DIAGNOSIS — E785 Hyperlipidemia, unspecified: Secondary | ICD-10-CM | POA: Diagnosis not present

## 2015-06-16 DIAGNOSIS — Z7902 Long term (current) use of antithrombotics/antiplatelets: Secondary | ICD-10-CM | POA: Diagnosis not present

## 2015-06-16 DIAGNOSIS — Z8673 Personal history of transient ischemic attack (TIA), and cerebral infarction without residual deficits: Secondary | ICD-10-CM | POA: Insufficient documentation

## 2015-06-16 HISTORY — DX: Cerebral infarction, unspecified: I63.9

## 2015-06-16 LAB — CBC
HCT: 37.8 % (ref 36.0–46.0)
HEMOGLOBIN: 12.5 g/dL (ref 12.0–15.0)
MCH: 28.3 pg (ref 26.0–34.0)
MCHC: 33.1 g/dL (ref 30.0–36.0)
MCV: 85.5 fL (ref 78.0–100.0)
Platelets: 340 10*3/uL (ref 150–400)
RBC: 4.42 MIL/uL (ref 3.87–5.11)
RDW: 13.1 % (ref 11.5–15.5)
WBC: 6 10*3/uL (ref 4.0–10.5)

## 2015-06-16 LAB — NO BLOOD PRODUCTS

## 2015-06-16 LAB — COMPREHENSIVE METABOLIC PANEL
ALK PHOS: 68 U/L (ref 38–126)
ALT: 22 U/L (ref 14–54)
AST: 25 U/L (ref 15–41)
Albumin: 3.8 g/dL (ref 3.5–5.0)
Anion gap: 10 (ref 5–15)
BUN: 17 mg/dL (ref 6–20)
CALCIUM: 9.7 mg/dL (ref 8.9–10.3)
CO2: 24 mmol/L (ref 22–32)
CREATININE: 0.93 mg/dL (ref 0.44–1.00)
Chloride: 108 mmol/L (ref 101–111)
GFR calc non Af Amer: >60 mL/min (ref >60)
GLUCOSE: 134 mg/dL — AB (ref 65–99)
Potassium: 4.2 mmol/L (ref 3.5–5.1)
SODIUM: 142 mmol/L (ref 135–145)
Total Bilirubin: 0.6 mg/dL (ref 0.3–1.2)
Total Protein: 6.4 g/dL — ABNORMAL LOW (ref 6.5–8.1)

## 2015-06-16 LAB — URINE MICROSCOPIC-ADD ON

## 2015-06-16 LAB — URINALYSIS, ROUTINE W REFLEX MICROSCOPIC
Bilirubin Urine: NEGATIVE
GLUCOSE, UA: NEGATIVE mg/dL
Ketones, ur: NEGATIVE mg/dL
Nitrite: POSITIVE — AB
PH: 6 (ref 5.0–8.0)
Protein, ur: NEGATIVE mg/dL
SPECIFIC GRAVITY, URINE: 1.013 (ref 1.005–1.030)

## 2015-06-16 LAB — APTT: aPTT: 31 seconds (ref 24–37)

## 2015-06-16 LAB — SURGICAL PCR SCREEN
MRSA, PCR: NEGATIVE
Staphylococcus aureus: NEGATIVE

## 2015-06-16 LAB — GLUCOSE, CAPILLARY: GLUCOSE-CAPILLARY: 111 mg/dL — AB (ref 65–99)

## 2015-06-16 LAB — PROTIME-INR
INR: 1.1 (ref 0.00–1.49)
Prothrombin Time: 14.4 seconds (ref 11.6–15.2)

## 2015-06-16 MED ORDER — CIPROFLOXACIN HCL 500 MG PO TABS: 500.0000 mg | ORAL_TABLET | Freq: Two times a day (BID) | ORAL | Status: DC

## 2015-06-16 NOTE — Progress Notes (Signed)
PCP: Dr.vGutierezz-Whitsett,Hollyvilla Burgess Cardiologist: Dr. [name] Endocrinologist: Dr. Elvera Lennox  Pt. States fasting blood sugars 50-100.  Pt. Refuses blood due to [place] witness. Notified Dr. Coralee Pesa office, spoke to carole pullins.

## 2015-06-16 NOTE — Telephone Encounter (Signed)
[name] at [facility] called to let us know this patient show positive nitrites in urine. I have faxed Rx for Cipro to [facility]. I tried to call patient but her mailbox was full.  [name] in preadm will inform patient to start taking tonight.

## 2015-06-17 NOTE — Progress Notes (Signed)
Anesthesia Chart Review: Patient is a 59 year old female scheduled for left carotid endarterectomy by Dr. Myra Gianotti on 06/23/2015. Recent CVA at [facility] with carotid duplex revealing 70-99% LICA stenosis.  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 Jehovah's Witness. BMI is consistent with obesity.  PCP is Dr. Genella Rife. Cardiologist is Dr. [name], last visit with Tereso Newcomer, PA-D on 06/13/15. She was referred to Dr. Myra Gianotti for left CEA with acceptable surgical risk.   Meds include alogliptin, ASA, Lipitor, Zebeta, Cipro, Plavix (on hold), Lantus, lisinopril, Nitro.   EKG 05/26/15: NSR, cannot rule out anterior infarct (age undetermined)  Myoview 06/09/15: No evidence of ischemia by perfusion imaging. EF 46%.No ST segment deviation during stress. Intermediate risk. Continued medical treatment recommended.  Echo 06/09/15: Mild LVH, EF 55-60%, normal wall motion, grade 2 diastolic dysfunction, trivial AR, MAC, mild MR, mild LAE  Carotid US 05/30/15: L 80-99%; R CEA patent  LHC 04/21/12: LM: Minimal disease LAD: Proximal 30%, septal perforator severely diseased, mid LAD 95% LCx: Luminal irregularities RCA: Ostial 50%, proximal stent patent with 40-50% prior to stent Final Conclusions: 95% mid LAD stenosis is the likely cause of patient's symptoms. Reload 300 mg Plavix. Dr. Clifton James will intervene today. I spoke with Dr. Myra Gianotti, will delay carotid endarterectomy that had been scheduled for Thursday.  PCI: Successful PTCA with balloon angioplasty only of the distal LAD. (vessel not large enough for a  stent).Continue ASA and Plavix for at least one month.    Event Monitor 12/26/11-01/15/12: SR, no afib  Preoperative labs noted. A1C was 8.3 on 05/10/15. [facility] is aware of UA results and is starting patient on Cipro. NO BLOOD PRODUCTS.  If no acute changes then I anticipate that she can proceed as planned.  Velna Ochs [facility] Short Stay Center/Anesthesiology Phone [phone] 06/17/2015 12:40 PM

## 2015-06-22 MED ORDER — SODIUM CHLORIDE 0.9 % IV SOLN: INTRAVENOUS | Status: DC

## 2015-06-22 MED ORDER — VANCOMYCIN HCL IN DEXTROSE 1-5 GM/200ML-% IV SOLN
1000.0000 mg | INTRAVENOUS | Status: AC
  Administered 2015-06-23: 1000 mg via INTRAVENOUS
  Filled 2015-06-22: qty 200

## 2015-06-22 MED ORDER — CHLORHEXIDINE GLUCONATE CLOTH 2 % EX PADS: 6.0000 | MEDICATED_PAD | Freq: Once | CUTANEOUS | Status: DC

## 2015-06-23 ENCOUNTER — Encounter (HOSPITAL_COMMUNITY)
Admission: RE | Disposition: A | Discharge status: Home / Self Care | Payer: Self-pay | Source: Ambulatory Visit | Attending: Surgery

## 2015-06-23 ENCOUNTER — Inpatient Hospital Stay (HOSPITAL_COMMUNITY): Payer: Managed Care, Other (non HMO) | Admitting: Certified Registered Nurse Anesthetist

## 2015-06-23 ENCOUNTER — Inpatient Hospital Stay (HOSPITAL_COMMUNITY): Payer: Managed Care, Other (non HMO) | Admitting: Vascular Surgery

## 2015-06-23 ENCOUNTER — Inpatient Hospital Stay (HOSPITAL_COMMUNITY)
Admission: RE | Admit: 2015-06-23 | Discharge: 2015-06-24 | DRG: 039 | Disposition: A | Discharge status: Home / Self Care | Payer: Managed Care, Other (non HMO) | Source: Ambulatory Visit | Attending: Surgery | Admitting: Surgery

## 2015-06-23 ENCOUNTER — Telehealth: Payer: Self-pay | Admitting: Surgery

## 2015-06-23 DIAGNOSIS — G629 Polyneuropathy, unspecified: Secondary | ICD-10-CM | POA: Diagnosis present

## 2015-06-23 DIAGNOSIS — Z794 Long term (current) use of insulin: Secondary | ICD-10-CM

## 2015-06-23 DIAGNOSIS — J45909 Unspecified asthma, uncomplicated: Secondary | ICD-10-CM | POA: Diagnosis present

## 2015-06-23 DIAGNOSIS — Z7982 Long term (current) use of aspirin: Secondary | ICD-10-CM

## 2015-06-23 DIAGNOSIS — Z888 Allergy status to other drugs, medicaments and biological substances status: Secondary | ICD-10-CM | POA: Diagnosis not present

## 2015-06-23 DIAGNOSIS — G8929 Other chronic pain: Secondary | ICD-10-CM | POA: Diagnosis present

## 2015-06-23 DIAGNOSIS — I252 Old myocardial infarction: Secondary | ICD-10-CM | POA: Diagnosis not present

## 2015-06-23 DIAGNOSIS — Z9841 Cataract extraction status, right eye: Secondary | ICD-10-CM | POA: Diagnosis not present

## 2015-06-23 DIAGNOSIS — Z87442 Personal history of urinary calculi: Secondary | ICD-10-CM | POA: Diagnosis not present

## 2015-06-23 DIAGNOSIS — H269 Unspecified cataract: Secondary | ICD-10-CM | POA: Diagnosis present

## 2015-06-23 DIAGNOSIS — I6522 Occlusion and stenosis of left carotid artery: Secondary | ICD-10-CM

## 2015-06-23 DIAGNOSIS — Z880 Allergy status to penicillin: Secondary | ICD-10-CM | POA: Diagnosis not present

## 2015-06-23 DIAGNOSIS — F329 Major depressive disorder, single episode, unspecified: Secondary | ICD-10-CM | POA: Diagnosis present

## 2015-06-23 DIAGNOSIS — Z79899 Other long term (current) drug therapy: Secondary | ICD-10-CM | POA: Diagnosis not present

## 2015-06-23 DIAGNOSIS — Z9861 Coronary angioplasty status: Secondary | ICD-10-CM | POA: Diagnosis not present

## 2015-06-23 DIAGNOSIS — Z87891 Personal history of nicotine dependence: Secondary | ICD-10-CM | POA: Diagnosis not present

## 2015-06-23 DIAGNOSIS — E785 Hyperlipidemia, unspecified: Secondary | ICD-10-CM | POA: Diagnosis present

## 2015-06-23 DIAGNOSIS — M545 Low back pain: Secondary | ICD-10-CM | POA: Diagnosis present

## 2015-06-23 DIAGNOSIS — Z8673 Personal history of transient ischemic attack (TIA), and cerebral infarction without residual deficits: Secondary | ICD-10-CM | POA: Diagnosis not present

## 2015-06-23 DIAGNOSIS — M1990 Unspecified osteoarthritis, unspecified site: Secondary | ICD-10-CM | POA: Diagnosis not present

## 2015-06-23 DIAGNOSIS — J069 Acute upper respiratory infection, unspecified: Secondary | ICD-10-CM | POA: Diagnosis not present

## 2015-06-23 DIAGNOSIS — Z7901 Long term (current) use of anticoagulants: Secondary | ICD-10-CM | POA: Diagnosis not present

## 2015-06-23 DIAGNOSIS — Z9889 Other specified postprocedural states: Secondary | ICD-10-CM | POA: Diagnosis not present

## 2015-06-23 DIAGNOSIS — I2510 Atherosclerotic heart disease of native coronary artery without angina pectoris: Secondary | ICD-10-CM | POA: Diagnosis present

## 2015-06-23 DIAGNOSIS — I6529 Occlusion and stenosis of unspecified carotid artery: Secondary | ICD-10-CM | POA: Diagnosis present

## 2015-06-23 DIAGNOSIS — N390 Urinary tract infection, site not specified: Secondary | ICD-10-CM

## 2015-06-23 DIAGNOSIS — E119 Type 2 diabetes mellitus without complications: Secondary | ICD-10-CM | POA: Diagnosis not present

## 2015-06-23 DIAGNOSIS — J45901 Unspecified asthma with (acute) exacerbation: Secondary | ICD-10-CM | POA: Diagnosis not present

## 2015-06-23 DIAGNOSIS — I10 Essential (primary) hypertension: Secondary | ICD-10-CM | POA: Diagnosis present

## 2015-06-23 DIAGNOSIS — R0602 Shortness of breath: Secondary | ICD-10-CM | POA: Diagnosis present

## 2015-06-23 HISTORY — PX: ENDARTERECTOMY: SHX5162

## 2015-06-23 HISTORY — PX: PATCH ANGIOPLASTY: SHX6230

## 2015-06-23 LAB — GLUCOSE, CAPILLARY
GLUCOSE-CAPILLARY: 136 mg/dL — AB (ref 65–99)
Glucose-Capillary: 134 mg/dL — ABNORMAL HIGH (ref 65–99)
Glucose-Capillary: 151 mg/dL — ABNORMAL HIGH (ref 65–99)
Glucose-Capillary: 133 mg/dL — ABNORMAL HIGH (ref 65–99)

## 2015-06-23 LAB — CREATININE, SERUM
Creatinine, Ser: 0.84 mg/dL (ref 0.44–1.00)
GFR calc Af Amer: >60 mL/min (ref >60)
GFR calc non Af Amer: >60 mL/min (ref >60)

## 2015-06-23 LAB — CBC
HCT: 31.3 % — ABNORMAL LOW (ref 36.0–46.0)
Hemoglobin: 10.4 g/dL — ABNORMAL LOW (ref 12.0–15.0)
MCH: 27.7 pg (ref 26.0–34.0)
MCHC: 33.2 g/dL (ref 30.0–36.0)
MCV: 83.2 fL (ref 78.0–100.0)
PLATELETS: 255 10*3/uL (ref 150–400)
RBC: 3.76 MIL/uL — AB (ref 3.87–5.11)
RDW: 13 % (ref 11.5–15.5)
WBC: 7.8 10*3/uL (ref 4.0–10.5)

## 2015-06-23 SURGERY — ENDARTERECTOMY, CAROTID
Anesthesia: General | Site: Neck | Laterality: Left

## 2015-06-23 MED ORDER — SODIUM CHLORIDE 0.45 % IV SOLN
INTRAVENOUS | Status: DC
  Administered 2015-06-23: 19:00:00 via INTRAVENOUS

## 2015-06-23 MED ORDER — ROCURONIUM BROMIDE 100 MG/10ML IV SOLN
INTRAVENOUS | Status: DC | PRN
  Administered 2015-06-23: 50 mg via INTRAVENOUS

## 2015-06-23 MED ORDER — SODIUM CHLORIDE 0.9 % IV SOLN
INTRAVENOUS | Status: DC | PRN
  Administered 2015-06-23: 08:00:00

## 2015-06-23 MED ORDER — LIDOCAINE HCL 4 % MT SOLN
OROMUCOSAL | Status: DC | PRN
  Administered 2015-06-23: 4 mL via TOPICAL

## 2015-06-23 MED ORDER — ARTIFICIAL TEARS OP OINT
TOPICAL_OINTMENT | OPHTHALMIC | Status: DC | PRN
  Administered 2015-06-23: 1 via OPHTHALMIC

## 2015-06-23 MED ORDER — LABETALOL HCL 5 MG/ML IV SOLN
INTRAVENOUS | Status: AC
  Filled 2015-06-23: qty 4

## 2015-06-23 MED ORDER — PROTAMINE SULFATE 10 MG/ML IV SOLN
INTRAVENOUS | Status: DC | PRN
  Administered 2015-06-23: 25 mg via INTRAVENOUS
  Administered 2015-06-23: 20 mg via INTRAVENOUS
  Administered 2015-06-23: 5 mg via INTRAVENOUS

## 2015-06-23 MED ORDER — SODIUM CHLORIDE 0.9 % IV SOLN
0.0125 ug/kg/min | INTRAVENOUS | Status: DC
  Filled 2015-06-23: qty 2000

## 2015-06-23 MED ORDER — ALUM & MAG HYDROXIDE-SIMETH 200-200-20 MG/5ML PO SUSP: 15.0000 mL | ORAL | Status: DC | PRN

## 2015-06-23 MED ORDER — INSULIN GLARGINE 100 UNIT/ML ~~LOC~~ SOLN
30.0000 [IU] | Freq: Every day | SUBCUTANEOUS | Status: DC
  Filled 2015-06-23 (×2): qty 0.3

## 2015-06-23 MED ORDER — OXYCODONE HCL 5 MG PO TABS
5.0000 mg | ORAL_TABLET | Freq: Once | ORAL | Status: AC | PRN
  Administered 2015-06-23: 5 mg via ORAL

## 2015-06-23 MED ORDER — PROPOFOL 10 MG/ML IV BOLUS
INTRAVENOUS | Status: AC
  Filled 2015-06-23: qty 20

## 2015-06-23 MED ORDER — LISINOPRIL 20 MG PO TABS
20.0000 mg | ORAL_TABLET | Freq: Every day | ORAL | Status: DC
  Administered 2015-06-23 – 2015-06-24 (×2): 20 mg via ORAL
  Filled 2015-06-23 (×2): qty 1

## 2015-06-23 MED ORDER — HEPARIN SODIUM (PORCINE) 1000 UNIT/ML IJ SOLN
INTRAMUSCULAR | Status: DC | PRN
  Administered 2015-06-23: 9 mL via INTRAVENOUS

## 2015-06-23 MED ORDER — ONDANSETRON HCL 4 MG/2ML IJ SOLN
INTRAMUSCULAR | Status: AC
  Filled 2015-06-23: qty 2

## 2015-06-23 MED ORDER — HEPARIN SODIUM (PORCINE) 1000 UNIT/ML IJ SOLN
INTRAMUSCULAR | Status: AC
  Filled 2015-06-23: qty 3

## 2015-06-23 MED ORDER — METOPROLOL TARTRATE 1 MG/ML IV SOLN: 2.0000 mg | INTRAVENOUS | Status: DC | PRN

## 2015-06-23 MED ORDER — OXYCODONE HCL 5 MG/5ML PO SOLN: 5.0000 mg | Freq: Once | ORAL | Status: AC | PRN

## 2015-06-23 MED ORDER — PHENOL 1.4 % MT LIQD: 1.0000 | OROMUCOSAL | Status: DC | PRN

## 2015-06-23 MED ORDER — ACETAMINOPHEN 325 MG PO TABS
325.0000 mg | ORAL_TABLET | ORAL | Status: DC | PRN
  Administered 2015-06-24: 650 mg via ORAL
  Filled 2015-06-23: qty 2

## 2015-06-23 MED ORDER — OXYCODONE-ACETAMINOPHEN 5-325 MG PO TABS: 1.0000 | ORAL_TABLET | ORAL | Status: DC | PRN

## 2015-06-23 MED ORDER — DOCUSATE SODIUM 100 MG PO CAPS
100.0000 mg | ORAL_CAPSULE | Freq: Every day | ORAL | Status: DC
  Filled 2015-06-23: qty 1

## 2015-06-23 MED ORDER — ASPIRIN EC 81 MG PO TBEC
81.0000 mg | DELAYED_RELEASE_TABLET | Freq: Every day | ORAL | Status: DC
  Administered 2015-06-24: 81 mg via ORAL
  Filled 2015-06-23 (×2): qty 1

## 2015-06-23 MED ORDER — LIDOCAINE HCL (CARDIAC) 20 MG/ML IV SOLN
INTRAVENOUS | Status: DC | PRN
  Administered 2015-06-23: 60 mg via INTRATRACHEAL

## 2015-06-23 MED ORDER — LACTATED RINGERS IV SOLN
INTRAVENOUS | Status: DC | PRN
  Administered 2015-06-23: 08:00:00 via INTRAVENOUS

## 2015-06-23 MED ORDER — HEMOSTATIC AGENTS (NO CHARGE) OPTIME
TOPICAL | Status: DC | PRN
  Administered 2015-06-23: 1 via TOPICAL

## 2015-06-23 MED ORDER — INSULIN ASPART 100 UNIT/ML ~~LOC~~ SOLN
0.0000 [IU] | Freq: Three times a day (TID) | SUBCUTANEOUS | Status: DC
  Administered 2015-06-23 – 2015-06-24 (×2): 2 [IU] via SUBCUTANEOUS

## 2015-06-23 MED ORDER — NEOSTIGMINE METHYLSULFATE 10 MG/10ML IV SOLN
INTRAVENOUS | Status: AC
  Filled 2015-06-23: qty 1

## 2015-06-23 MED ORDER — LIDOCAINE HCL (PF) 1 % IJ SOLN
INTRAMUSCULAR | Status: AC
  Filled 2015-06-23: qty 30

## 2015-06-23 MED ORDER — BISOPROLOL FUMARATE 5 MG PO TABS
5.0000 mg | ORAL_TABLET | Freq: Every day | ORAL | Status: DC
Start: 2015-06-23 — End: 2015-06-24
  Administered 2015-06-23 – 2015-06-24 (×2): 5 mg via ORAL
  Filled 2015-06-23 (×2): qty 1

## 2015-06-23 MED ORDER — ACETAMINOPHEN 160 MG/5ML PO SOLN
325.0000 mg | ORAL | Status: DC | PRN
  Filled 2015-06-23: qty 20.3

## 2015-06-23 MED ORDER — FENTANYL CITRATE (PF) 100 MCG/2ML IJ SOLN
25.0000 ug | INTRAMUSCULAR | Status: DC | PRN
  Administered 2015-06-23 (×4): 25 ug via INTRAVENOUS

## 2015-06-23 MED ORDER — HYDRALAZINE HCL 20 MG/ML IJ SOLN: 5.0000 mg | INTRAMUSCULAR | Status: DC | PRN

## 2015-06-23 MED ORDER — MIDAZOLAM HCL 2 MG/2ML IJ SOLN
INTRAMUSCULAR | Status: DC | PRN
  Administered 2015-06-23: 2 mg via INTRAVENOUS

## 2015-06-23 MED ORDER — ALOGLIPTIN BENZOATE 25 MG PO TABS: 25.0000 mg | ORAL_TABLET | Freq: Every day | ORAL | Status: DC

## 2015-06-23 MED ORDER — CLOPIDOGREL BISULFATE 75 MG PO TABS
75.0000 mg | ORAL_TABLET | Freq: Every day | ORAL | Status: DC
  Administered 2015-06-24: 75 mg via ORAL
  Filled 2015-06-23: qty 1

## 2015-06-23 MED ORDER — ACETAMINOPHEN 325 MG PO TABS: 325.0000 mg | ORAL_TABLET | ORAL | Status: DC | PRN

## 2015-06-23 MED ORDER — SODIUM CHLORIDE 0.9 % IV SOLN
0.0125 ug/kg/min | INTRAVENOUS | Status: AC
  Administered 2015-06-23: .3 ug/kg/min via INTRAVENOUS
  Filled 2015-06-23: qty 2000

## 2015-06-23 MED ORDER — MIDAZOLAM HCL 2 MG/2ML IJ SOLN
INTRAMUSCULAR | Status: AC
  Filled 2015-06-23: qty 2

## 2015-06-23 MED ORDER — ATORVASTATIN CALCIUM 40 MG PO TABS
40.0000 mg | ORAL_TABLET | Freq: Every day | ORAL | Status: DC
  Administered 2015-06-24: 40 mg via ORAL
  Filled 2015-06-23 (×2): qty 1

## 2015-06-23 MED ORDER — DEXTROSE 5 % IV SOLN
10.0000 mg | INTRAVENOUS | Status: DC | PRN
  Administered 2015-06-23: 25 ug/min via INTRAVENOUS

## 2015-06-23 MED ORDER — POTASSIUM CHLORIDE CRYS ER 20 MEQ PO TBCR: 20.0000 meq | EXTENDED_RELEASE_TABLET | Freq: Every day | ORAL | Status: DC | PRN

## 2015-06-23 MED ORDER — EPHEDRINE SULFATE 50 MG/ML IJ SOLN
INTRAMUSCULAR | Status: AC
  Filled 2015-06-23: qty 1

## 2015-06-23 MED ORDER — FENTANYL CITRATE (PF) 250 MCG/5ML IJ SOLN
INTRAMUSCULAR | Status: DC | PRN
  Administered 2015-06-23 (×2): 100 ug via INTRAVENOUS
  Administered 2015-06-23: 50 ug via INTRAVENOUS

## 2015-06-23 MED ORDER — 0.9 % SODIUM CHLORIDE (POUR BTL) OPTIME
TOPICAL | Status: DC | PRN
  Administered 2015-06-23: 1000 mL

## 2015-06-23 MED ORDER — PROPOFOL 10 MG/ML IV BOLUS
INTRAVENOUS | Status: DC | PRN
  Administered 2015-06-23: 20 mg via INTRAVENOUS
  Administered 2015-06-23: 50 mg via INTRAVENOUS

## 2015-06-23 MED ORDER — LABETALOL HCL 5 MG/ML IV SOLN
INTRAVENOUS | Status: DC | PRN
  Administered 2015-06-23: 5 mg via INTRAVENOUS
  Administered 2015-06-23: 10 mg via INTRAVENOUS
  Administered 2015-06-23: 5 mg via INTRAVENOUS

## 2015-06-23 MED ORDER — FENTANYL CITRATE (PF) 250 MCG/5ML IJ SOLN
INTRAMUSCULAR | Status: AC
  Filled 2015-06-23: qty 5

## 2015-06-23 MED ORDER — MAGNESIUM SULFATE 2 GM/50ML IV SOLN: 2.0000 g | Freq: Every day | INTRAVENOUS | Status: DC | PRN

## 2015-06-23 MED ORDER — GUAIFENESIN-DM 100-10 MG/5ML PO SYRP: 15.0000 mL | ORAL_SOLUTION | ORAL | Status: DC | PRN

## 2015-06-23 MED ORDER — NEOSTIGMINE METHYLSULFATE 10 MG/10ML IV SOLN
INTRAVENOUS | Status: DC | PRN
  Administered 2015-06-23: 4 mg via INTRAVENOUS

## 2015-06-23 MED ORDER — ONDANSETRON HCL 4 MG/2ML IJ SOLN: 4.0000 mg | Freq: Four times a day (QID) | INTRAMUSCULAR | Status: DC | PRN

## 2015-06-23 MED ORDER — ROCURONIUM BROMIDE 50 MG/5ML IV SOLN
INTRAVENOUS | Status: AC
  Filled 2015-06-23: qty 1

## 2015-06-23 MED ORDER — ARTIFICIAL TEARS OP OINT
TOPICAL_OINTMENT | OPHTHALMIC | Status: AC
  Filled 2015-06-23: qty 3.5

## 2015-06-23 MED ORDER — ENOXAPARIN SODIUM 40 MG/0.4ML ~~LOC~~ SOLN: 40.0000 mg | SUBCUTANEOUS | Status: DC

## 2015-06-23 MED ORDER — ESMOLOL HCL 100 MG/10ML IV SOLN
INTRAVENOUS | Status: AC
  Filled 2015-06-23: qty 10

## 2015-06-23 MED ORDER — GLYCOPYRROLATE 0.2 MG/ML IJ SOLN
INTRAMUSCULAR | Status: DC | PRN
  Administered 2015-06-23: 0.6 mg via INTRAVENOUS

## 2015-06-23 MED ORDER — MORPHINE SULFATE (PF) 2 MG/ML IV SOLN: 2.0000 mg | INTRAVENOUS | Status: DC | PRN

## 2015-06-23 MED ORDER — PROTAMINE SULFATE 10 MG/ML IV SOLN
INTRAVENOUS | Status: AC
  Filled 2015-06-23: qty 10

## 2015-06-23 MED ORDER — SODIUM CHLORIDE 0.9 % IJ SOLN
INTRAMUSCULAR | Status: AC
  Filled 2015-06-23: qty 10

## 2015-06-23 MED ORDER — SODIUM CHLORIDE 0.9 % IV SOLN: 500.0000 mL | Freq: Once | INTRAVENOUS | Status: DC | PRN

## 2015-06-23 MED ORDER — OXYCODONE HCL 5 MG PO TABS
ORAL_TABLET | ORAL | Status: AC
  Filled 2015-06-23: qty 1

## 2015-06-23 MED ORDER — FENTANYL CITRATE (PF) 100 MCG/2ML IJ SOLN
INTRAMUSCULAR | Status: AC
  Filled 2015-06-23: qty 2

## 2015-06-23 MED ORDER — LABETALOL HCL 5 MG/ML IV SOLN
10.0000 mg | INTRAVENOUS | Status: DC | PRN
  Administered 2015-06-23 (×2): 10 mg via INTRAVENOUS

## 2015-06-23 MED ORDER — PANTOPRAZOLE SODIUM 40 MG PO TBEC
40.0000 mg | DELAYED_RELEASE_TABLET | Freq: Every day | ORAL | Status: DC
  Filled 2015-06-23 (×2): qty 1

## 2015-06-23 MED ORDER — ACETAMINOPHEN 650 MG RE SUPP: 325.0000 mg | RECTAL | Status: DC | PRN

## 2015-06-23 MED ORDER — CIPROFLOXACIN HCL 500 MG PO TABS
500.0000 mg | ORAL_TABLET | Freq: Two times a day (BID) | ORAL | Status: DC
  Administered 2015-06-24: 500 mg via ORAL
  Filled 2015-06-23 (×3): qty 1

## 2015-06-23 MED ORDER — LIDOCAINE HCL (CARDIAC) 20 MG/ML IV SOLN
INTRAVENOUS | Status: AC
  Filled 2015-06-23: qty 5

## 2015-06-23 MED ORDER — GLYCOPYRROLATE 0.2 MG/ML IJ SOLN
INTRAMUSCULAR | Status: AC
  Filled 2015-06-23: qty 3

## 2015-06-23 MED ORDER — NITROGLYCERIN 0.4 MG SL SUBL: 0.4000 mg | SUBLINGUAL_TABLET | SUBLINGUAL | Status: DC | PRN

## 2015-06-23 MED ORDER — VANCOMYCIN HCL IN DEXTROSE 1-5 GM/200ML-% IV SOLN
1000.0000 mg | Freq: Two times a day (BID) | INTRAVENOUS | Status: AC
  Administered 2015-06-23 – 2015-06-24 (×2): 1000 mg via INTRAVENOUS
  Filled 2015-06-23 (×3): qty 200

## 2015-06-23 MED ORDER — ONDANSETRON HCL 4 MG/2ML IJ SOLN
INTRAMUSCULAR | Status: DC | PRN
  Administered 2015-06-23: 4 mg via INTRAVENOUS

## 2015-06-23 MED ORDER — SUCCINYLCHOLINE CHLORIDE 20 MG/ML IJ SOLN
INTRAMUSCULAR | Status: AC
  Filled 2015-06-23: qty 1

## 2015-06-23 SURGICAL SUPPLY — 49 items
CANISTER SUCTION 2500CC (MISCELLANEOUS) ×3 IMPLANT
CATH ROBINSON RED A/P 18FR (CATHETERS) ×3 IMPLANT
CATH SUCT 10FR WHISTLE TIP (CATHETERS) ×3 IMPLANT
CLIP TI MEDIUM 6 (CLIP) ×3 IMPLANT
CLIP TI WIDE RED SMALL 24 (CLIP) ×3 IMPLANT
CLIP TI WIDE RED SMALL 6 (CLIP) ×3 IMPLANT
CRADLE DONUT ADULT HEAD (MISCELLANEOUS) ×3 IMPLANT
DRAIN CHANNEL 15F RND FF W/TCR (WOUND CARE) IMPLANT
ELECT REM PT RETURN 9FT ADLT (ELECTROSURGICAL) ×3
ELECTRODE REM PT RTRN 9FT ADLT (ELECTROSURGICAL) ×1 IMPLANT
EVACUATOR SILICONE 100CC (DRAIN) IMPLANT
GAUZE SPONGE 4X4 12PLY STRL (GAUZE/BANDAGES/DRESSINGS) ×3 IMPLANT
GLOVE BIO SURGEON STRL SZ 6.5 (GLOVE) ×2 IMPLANT
GLOVE BIO SURGEONS STRL SZ 6.5 (GLOVE) ×1
GLOVE BIOGEL PI IND STRL 6.5 (GLOVE) ×1 IMPLANT
GLOVE BIOGEL PI IND STRL 7.5 (GLOVE) ×1 IMPLANT
GLOVE BIOGEL PI INDICATOR 6.5 (GLOVE) ×2
GLOVE BIOGEL PI INDICATOR 7.5 (GLOVE) ×2
GLOVE SS BIOGEL STRL SZ 7.5 (GLOVE) ×1 IMPLANT
GLOVE SUPERSENSE BIOGEL SZ 7.5 (GLOVE) ×2
GLOVE SURG SS PI 7.5 STRL IVOR (GLOVE) ×6 IMPLANT
GOWN STRL REUS W/ TWL LRG LVL3 (GOWN DISPOSABLE) ×2 IMPLANT
GOWN STRL REUS W/ TWL XL LVL3 (GOWN DISPOSABLE) ×1 IMPLANT
GOWN STRL REUS W/TWL LRG LVL3 (GOWN DISPOSABLE) ×4
GOWN STRL REUS W/TWL XL LVL3 (GOWN DISPOSABLE) ×2
HEMOSTAT SNOW SURGICEL 2X4 (HEMOSTASIS) ×3 IMPLANT
INSERT FOGARTY SM (MISCELLANEOUS) IMPLANT
KIT BASIN OR (CUSTOM PROCEDURE TRAY) ×3 IMPLANT
KIT ROOM TURNOVER OR (KITS) ×3 IMPLANT
LIQUID BAND (GAUZE/BANDAGES/DRESSINGS) ×3 IMPLANT
NEEDLE HYPO 25GX1X1/2 BEV (NEEDLE) IMPLANT
NS IRRIG 1000ML POUR BTL (IV SOLUTION) ×9 IMPLANT
PACK CAROTID (CUSTOM PROCEDURE TRAY) ×3 IMPLANT
PAD ARMBOARD 7.5X6 YLW CONV (MISCELLANEOUS) ×6 IMPLANT
PATCH VASC XENOSURE 1CMX6CM (Vascular Products) ×2 IMPLANT
PATCH VASC XENOSURE 1X6 (Vascular Products) ×1 IMPLANT
SHUNT CAROTID BYPASS 10 (VASCULAR PRODUCTS) IMPLANT
SHUNT CAROTID BYPASS 12FRX15.5 (VASCULAR PRODUCTS) IMPLANT
SPONGE INTESTINAL PEANUT (DISPOSABLE) ×3 IMPLANT
SUT ETHILON 3 0 PS 1 (SUTURE) IMPLANT
SUT PROLENE 6 0 BV (SUTURE) ×6 IMPLANT
SUT PROLENE 7 0 BV 1 (SUTURE) IMPLANT
SUT SILK 3 0 TIES 17X18 (SUTURE)
SUT SILK 3-0 18XBRD TIE BLK (SUTURE) IMPLANT
SUT VIC AB 3-0 SH 27 (SUTURE) ×4
SUT VIC AB 3-0 SH 27X BRD (SUTURE) ×2 IMPLANT
SUT VICRYL 4-0 PS2 18IN ABS (SUTURE) ×6 IMPLANT
SYR CONTROL 10ML LL (SYRINGE) IMPLANT
WATER STERILE IRR 1000ML POUR (IV SOLUTION) ×3 IMPLANT

## 2015-06-23 NOTE — Anesthesia Preprocedure Evaluation (Addendum)
Anesthesia Evaluation  Patient identified by MRN, date of birth, ID band Patient awake    Reviewed: Allergy & Precautions, NPO status , Patient's Chart, lab work & pertinent test results  History of Anesthesia Complications Negative for: history of anesthetic complications  Airway Mallampati: I  TM Distance: >3 FB Neck ROM: Full    Dental  (+) Teeth Intact   Pulmonary neg shortness of breath, neg sleep apnea, neg COPD, neg recent URI, former smoker,    breath sounds clear to auscultation       Cardiovascular hypertension, Pt. on medications and Pt. on home beta blockers + CAD, + Past MI and + Peripheral Vascular Disease  (-) CHF (-) dysrhythmias  Rhythm:Regular     Neuro/Psych  Headaches, PSYCHIATRIC DISORDERS Anxiety Depression TIA Neuromuscular disease CVA, No Residual Symptoms    GI/Hepatic negative GI ROS, Neg liver ROS,   Endo/Other  diabetes, Type 2, Insulin Dependent  Renal/GU negative Renal ROS     Musculoskeletal  (+) Arthritis ,   Abdominal   Peds  Hematology negative hematology ROS (+)   Anesthesia Other Findings   Reproductive/Obstetrics                            Anesthesia Physical Anesthesia Plan  ASA: III  Anesthesia Plan: General   Post-op Pain Management:    Induction: Intravenous  Airway Management Planned: Oral ETT  Additional Equipment: Arterial line  Intra-op Plan:   Post-operative Plan: Extubation in OR  Informed Consent: I have reviewed the patients History and Physical, chart, labs and discussed the procedure including the risks, benefits and alternatives for the proposed anesthesia with the patient or authorized representative who has indicated his/her understanding and acceptance.   Dental advisory given  Plan Discussed with: CRNA and Surgeon  Anesthesia Plan Comments:         Anesthesia Quick Evaluation

## 2015-06-23 NOTE — Progress Notes (Signed)
Utilization review completed. Erwin Nishiyama, RN, BSN. 

## 2015-06-23 NOTE — Anesthesia Postprocedure Evaluation (Signed)
Anesthesia Post Note  Patient: [name]  Procedure(s) Performed: Procedure(s) (LRB): ENDARTERECTOMY CAROTID (Left) PATCH ANGIOPLASTY USING 1cm x 4cm BOVINE PERICARDIAL PATCH (Left)  Patient location during evaluation: PACU Anesthesia Type: General Level of consciousness: awake Pain management: pain level controlled Vital Signs Assessment: post-procedure vital signs reviewed and stable Respiratory status: spontaneous breathing Cardiovascular status: stable Postop Assessment: no signs of nausea or vomiting Anesthetic complications: no    Last Vitals:  Filed Vitals:   06/23/15 1312 06/23/15 1315  BP: 160/78   Pulse:  76  Temp:    Resp:  12    Last Pain:  Filed Vitals:   06/23/15 1334  PainSc: Asleep                 Zaiden Ludlum

## 2015-06-23 NOTE — Op Note (Signed)
Patient name: [name] MRN: [id number] DOB: 08/25/1955 Sex: female  06/23/2015 Pre-operative Diagnosis: Asymptomatic   left carotid stenosis Post-operative diagnosis:  Same Surgeon:  Durene Cal Assistants:  Karsten Ro Procedure:    left carotid Endarterectomy with bovine pericardial patch angioplasty Anesthesia:  General Blood Loss:  See anesthesia record Specimens:  Carotid Plaque to pathology  Findings:  80 %stenosis; Thrombus:  none  Indications:  59 yo with history of R CEA secondary to CVA, now with >80% left carotid stenosis, asymptomatic  Procedure:  The patient was identified in the holding area and taken to [facility] OR ROOM 16  The patient was then placed supine on the table.   General endotrachial anesthesia was administered.  The patient was prepped and draped in the usual sterile fashion.  A time out was called and antibiotics were administered.  The incision was made along the anterior border of the left sternocleidomastoid muscle.  Cautery was used to dissect through the subcutaneous tissue.  The platysma muscle was divided with cautery.  The internal jugular vein was exposed along its anterior medial border.  The common facial vein was exposed and then divided between 2-0 silk ties and metal clips.  The common carotid artery was then circumferentially exposed and encircled with an umbilical tape.  The vagus nerve was identified and protected.  Next sharp dissection was used to expose the external carotid artery and the superior thyroid artery.  The were encircled with a blue vessel loop and a 2-0 silk tie respectively.  Finally, the internal carotid was carefully dissected free.  An umbilical tape was placed around the internal carotid artery distal to the diseased segment.  The hypoglossal nerve was visualized throughout and protected.  The patient was given systemic heparinization.  A bovine carotid patch was selected and prepared on the back table.  A 10 french shunt was  also prepared.  After blood pressure readings were appropriate and the heparin had been given time to circulate, the internal carotid artery was occluded with a baby Gregory clamp.  The external and common carotid arteries were then occluded with vascular clamps and the 2-0 tie tightened on the superior thyroid artery.  A #11 blade was used to make an arteriotomy in the common carotid artery.  This was extended with Potts scissors along the anterior and lateral border of the common and internal carotid artery.  Approximately 80% stenosis was identified.  There was no thrombus identified.  The 10 french shunt was not placed, as there was pulsatile backbleeding.  A kleiner kuntz elevator was used to perform endarterectomy.  An eversion endarterectomy was performed in the external carotid artery.  A good distal endpoint was obtained in the internal carotid artery.  The specimen was removed and sent to pathology.  Heparinized saline was used to irrigate the endarterectomized field.  All potential embolic debris was removed.  Bovine pericardial patch angioplasty was then performed using a running 6-0 Prolene.  The common internal and external carotid arteries were all appropriately flushed. The artery was again irrigated with heparin saline.  The anastomosis was then secured. The clamp was first released on the external carotid artery followed by the common carotid artery approximately 30 seconds later, bloodflow was reestablish through the internal carotid artery.  Next, a hand-held  Doppler was used to evaluate the signals in the common, external, and internal  carotid arteries, all of which had appropriate signals. I then administered  50 mg protamine. The wound was then  irrigated.  After hemostasis was achieved, the carotid sheath was reapproximated with 3-0 Vicryl. The  platysma muscle was reapproximated with running 3-0 Vicryl. The skin  was closed with 4-0 Vicryl. Dermabond was placed on the skin. The    patient was then successfully extubated. Her neurologic exam was  similar to his preprocedural exam. The patient was then taken to recovery room  in stable condition. There were no complications.     Disposition:  To PACU in stable condition.  Relevant Operative Details:  Normal anatomy.  No shunt required, pulsatile backbleeding  V. Durene Cal, M.D. [facility] [place] Office: [phone] Pager:  [phone]

## 2015-06-23 NOTE — H&P (View-Only) (Signed)
Patient name: [name] MRN: [id number] DOB: 12/13/1955 Sex: female     Chief Complaint  Patient presents with  . New Evaluation    Ref. by Dr. Einar Crow  C/O Carotid Stenosis  S Hx Right CEA  2013    HISTORY OF PRESENT ILLNESS: The patient is back for follow-up.  I have not seen her for several years.  We initially met in 2013 when she presented to the hospital with confusion and short-term memory loss.  She was amnestic to these events.  She had an MRI which showed multiple small areas of acute infarct, bilaterally.  She had a TEE which suggested a possible cardiac source.  In addition, I performed carotid angiography which revealed approximately 50% left carotid stenosis and 80% right carotid stenosis.  Ultimately, the patient underwent a right carotid endarterectomy on 05/09/2012.  Intraoperative findings were an 85% lesion.  The patient did not return for follow-up.  In December 2015, she was admitted to [facility] with a left facial droop.  Reportedly, she was diagnosed with a stroke.  She was seen by vascular surgery but elected not to have a left carotid intervention.  She comes in today for follow-up.  She has not had any symptoms consistent with left carotid stenosis.  Patient has a significant cardiac history.  She had a 95% mid LAD stenosis treated with angioplasty in 2013 which preceded her carotid endarterectomy.  She had an event monitor which did not demonstrate any atrial fibrillation.  The patient has recently undergone laser eye surgery for a vitreous hemorrhage.  She is now back on aspirin.  The patient has a history of diabetes which is poorly controlled.  She is a former smoker having quit in 1977.  She suffers from hypercholesterolemia which is managed with a statin.  Her blood pressure is managed with an ACE inhibitor.  Past Medical History  Diagnosis Date  . CAD (coronary artery disease)     a. PCI [place] 2004. b. Angina - balloon angioplasty  to LAD 04/21/12 (vessel too small for stent).  . Refusal of blood transfusions as patient is Jehovah's Witness   . HLD (hyperlipidemia)     a. takes Lipitor daily  . Asthma   . Migraine aura without headache     a. visual changes  . Chronic lower back pain   . Anxiety     a. with claustrophobia  . Carotid stenosis     a. 11/2011 critical R, ~30% L by angiogram;  b. 04/2012 s/p R CEA.  Marland Kitchen Hypertension, accelerated   . Peripheral neuropathy ([place])   . Embolic stroke ([place])     a. 11/2011 MRI: mult small b/l infarcts c/w emboli;  b. 11/2011 MRA neck: high grade RICA stenosis;  c. 6/20013 TEE: EF 55-60% with mild LVH, severe MAC, < 1 cm calc mobile structure on the MV annulus, ? 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 Thought was RICA stenosis did not explain the b/l small strokes; concern for emboli from mobile MV struct;  d. 01/2012 Event Mon:  no AFib  . History of kidney stones   . Diabetes mellitus type 2, uncontrolled ([place])   . Cataract     left  . Depression     a. occasionally takes st john's wart  . Arthritis     "right hand; maybe just starting in my left hand" (04/21/2012)    Past Surgical History  Procedure Laterality Date  .  Kidney stone surgery      several times  . Carpal tunnel release  ~ 2005    bilateral  . Cataract extraction w/ intraocular lens implant  2012    right  . Tee without cardioversion  12/14/2011    Procedure: TRANSESOPHAGEAL ECHOCARDIOGRAM (TEE);  Surgeon: Larey Dresser, MD;  Location: [place];  Service: Cardiovascular;  Laterality: N/A;  . Ankle surgery  2005    after fall down stairs, left with screws and plate in place  . Holter monitor  2013    WNL  . US echocardiography  2002    normal LV fxn, mild LVH, borderline L atrial size  . Lithotripsy    . Coronary angioplasty with stent placement  2002    x2  . Cardiac catheterization  2004    WNL, no significant coronary disease  . Cardiac catheterization  12/19/11  . Coronary  angioplasty  04/21/2012    95% stenosis mid LAD, vessel not large enough for stent, rec plavix/ASA for 1 mo  . Endarterectomy  05/09/2012    Procedure: ENDARTERECTOMY CAROTID;  Surgeon: Serafina Mitchell, MD;  Location[facility] OR;  Service: Vascular;  Laterality: Right;  Right carotd endarterectomy with bovine patch angioplasty  . Carotid endarterectomy    . Carotid angiogram Bilateral 12/19/2011    Procedure: CAROTID ANGIOGRAM;  Surgeon: Serafina Mitchell, MD;  Location: [facility] CATH LAB;  Service: Cardiovascular;  Laterality: Bilateral;  . Percutaneous coronary stent intervention (pci-s) N/A 04/21/2012    Procedure: PERCUTANEOUS CORONARY STENT INTERVENTION (PCI-S);  Surgeon: Burnell Blanks, MD;  Location: [facility] C[facility] LAB;  Service: Cardiovascular;  Laterality: N/A;  . Pars plana vitrectomy Left 04/2015    [facility])    Social History   Social History  . Marital Status: Married    Spouse Name: N/A  . Number of Children: N/A  . Years of Education: N/A   Occupational History  . Not on file.   Social History Main Topics  . Smoking status: Former Smoker -- 1.00 packs/day for 3 years    Types: Cigarettes    Quit date: 06/26/1975  . Smokeless tobacco: Never Used  . Alcohol Use: No     Comment: none since 11/2011  . Drug Use: No  . Sexual Activity: Yes    Birth Control/ Protection: Post-menopausal   Other Topics Concern  . Not on file   Social History Narrative   Caffeine: 2 cups/day coffee   Lives alone, husband in [place].  4 children nearby, son to live with her.   Occupation: unemployed, prior worked as Radio broadcast assistant   Activity: stays active on farm   Diet: vegan, no processed foods, good water daily, fruits/vegetables daily   jehovah's witness - no blood products.    Family History  Problem Relation Age of Onset  . Heart disease Mother     valve problems  . Stroke Mother   . Hypertension Mother   . Heart disease Sister     valve problems  . Heart disease Brother   .  Diabetes Father   . Hypertension Father   . Cancer Neg Hx     Allergies as of 05/30/2015 - Review Complete 05/30/2015  Allergen Reaction Noted  . Isosorbide Other (See Comments) 04/21/2012  . Metformin and related Diarrhea and Nausea And Vomiting 01/09/2012  . Penicillins Itching and Other (See Comments) 12/12/2011  . Toradol [ketorolac tromethamine] Other (See Comments) 04/14/2012  . Other  05/09/2012    Current Outpatient  Prescriptions on File Prior to Visit  Medication Sig Dispense Refill  . aspirin (ASPIRIN EC) 81 MG EC tablet Take 81 mg by mouth daily. Swallow whole.    Marland Kitchen atorvastatin (LIPITOR) 40 MG tablet Take 1 tablet (40 mg total) by mouth daily. 30 tablet 11  . bisoprolol (ZEBETA) 5 MG tablet Take 1 tablet (5 mg total) by mouth daily. 30 tablet 11  . insulin NPH (HUMULIN N,NOVOLIN N) 100 UNIT/ML injection Inject 10-40 Units into the skin at bedtime. SLIDING SCALE    . lisinopril (PRINIVIL,ZESTRIL) 20 MG tablet Take 1 tablet (20 mg total) by mouth daily. 90 tablet 3  . nitroGLYCERIN (NITROSTAT) 0.4 MG SL tablet Place 0.4 mg under the tongue every 5 (five) minutes as needed. For chest pain     No current facility-administered medications on file prior to visit.     REVIEW OF SYSTEMS: Cardiovascular: No chest pain, chest pressure, palpitations, orthopnea, or dyspnea on exertion. No claudication or rest pain,  No history of DVT or phlebitis. Pulmonary: No productive cough, asthma or wheezing. Neurologic: No weakness, paresthesias, aphasia, or amaurosis. No dizziness. Hematologic: No bleeding problems or clotting disorders. Musculoskeletal: No joint pain or joint swelling. Gastrointestinal: No blood in stool or hematemesis Genitourinary: No dysuria or hematuria. Psychiatric:: No history of major depression. Integumentary: No rashes or ulcers. Constitutional: No fever or chills.  PHYSICAL EXAMINATION:   Vital signs are  Filed Vitals:   05/30/15 1407 05/30/15 1408  05/30/15 1415 05/30/15 1416  BP: 174/99 177/101 179/110 181/108  Pulse: 74 72 71 73  Temp:  98.1 F (36.7 C)    TempSrc:  Oral    Resp:  16    Height:  5' 6.5" (1.689 m)    Weight:  196 lb (88.905 kg)    SpO2:  96%     Body mass index is 31.16 kg/(m^2). General: The patient appears their stated age. HEENT:  No gross abnormalities Pulmonary:  Non labored breathing Abdomen: Soft and non-tender Musculoskeletal: There are no major deformities. Neurologic: No focal weakness or paresthesias are detected, Skin: There are no ulcer or rashes noted. Psychiatric: The patient has normal affect. Cardiovascular: There is a regular rate and rhythm without significant murmur appreciated.  Positive left carotid bruit   Diagnostic Studies The patient underwent carotid Doppler study today.  This shows a less than 40% right carotid stenosis and a 80-99 percent stenosis on the left.  Her bifurcation was high.  I reviewed the angiogram from 2013.  I do not think her bifurcation is too high to preclude operating  Assessment: Asymptomatic left carotid stenosis Plan: Based on her ultrasound, I have recommended proceeding with left carotid endarterectomy.  I discussed the risks and benefits of the procedure with the patient which include but are not limited to the risk of stroke, the risk of nerve damage, cardia pulmonary compilations and bleeding.  I have asked that she stop her over-the-counter blood thinners 5 days prior to her operation.  She will also get a formal cardiology clearance.  I'm also going to have her evaluated by ear nose and throat to make sure her vocal cords are functioning properly.  Once she has formal clearance, we will schedule her left carotid endarterectomy.  Eldridge Abrahams, M.D. [facility] [place] Office: [phone] Pager:  [phone]

## 2015-06-23 NOTE — Progress Notes (Signed)
  [facility] Day of Surgery Note  Subjective:  Patient seen in PACU. Having pain with left neck incision  Filed Vitals:   06/23/15 1145 06/23/15 1200  BP: 148/63 142/68  Pulse: 74 74  Temp:    Resp: 12 14    Incisions:  Left neck incision without hematoma Neuro:Tongue midline. Smile symmetric. 5/5 grip strength upper and lower extremities bilaterally.    Assessment/Plan:  This is a 59 y.o. female who is s/p left carotid endarterectomy  Stable post-op.  Neuro exam intact.  Neck without hematoma.  To 3S when bed available.    Maris Berger, [place] Pager: [phone] 06/23/2015 12:58 PM

## 2015-06-23 NOTE — Telephone Encounter (Signed)
-----   Message from Sharee Pimple, RN sent at 06/23/2015 10:23 AM EST ----- Regarding: schedule   ----- Message -----    From: Raymond Gurney, PA-C    Sent: 06/23/2015   9:48 AM      To: [facility] Charge Pool  S/p left CEA 06/23/2015  F/u with [facility] in 2 weeks  Thanks Selena Batten

## 2015-06-23 NOTE — Transfer of Care (Signed)
Immediate Anesthesia Transfer of Care Note  Patient: [name]  Procedure(s) Performed: Procedure(s): ENDARTERECTOMY CAROTID (Left) PATCH ANGIOPLASTY USING 1cm x 4cm BOVINE PERICARDIAL PATCH (Left)  Patient Location: PACU  Anesthesia Type:General  Level of Consciousness: awake, alert  and oriented  Airway & Oxygen Therapy: Patient Spontanous Breathing and Patient connected to nasal cannula oxygen  Post-op Assessment: Report given to RN, Post -op Vital signs reviewed and stable and Patient moving all extremities  Post vital signs: Reviewed and stable  Last Vitals:  Filed Vitals:   06/23/15 0552 06/23/15 0958  BP: 212/73   Pulse: 63   Temp: 36.9 C 36.8 C    Complications: No apparent anesthesia complications

## 2015-06-23 NOTE — Anesthesia Procedure Notes (Signed)
Procedure Name: Intubation Date/Time: 06/23/2015 7:51 AM Performed by: Orvilla Fus A Pre-anesthesia Checklist: Patient identified, Emergency Drugs available, Suction available, Patient being monitored and Timeout performed Patient Re-evaluated:Patient Re-evaluated prior to inductionOxygen Delivery Method: Circle system utilized Preoxygenation: Pre-oxygenation with 100% oxygen Intubation Type: IV induction Ventilation: Mask ventilation without difficulty Laryngoscope Size: Mac and 3 Grade View: Grade I Tube type: Oral Tube size: 7.0 mm Number of attempts: 1 Airway Equipment and Method: Stylet Placement Confirmation: ETT inserted through vocal cords under direct vision,  positive ETCO2 and breath sounds checked- equal and bilateral Secured at: 22 cm Tube secured with: Tape Dental Injury: Teeth and Oropharynx as per pre-operative assessment

## 2015-06-23 NOTE — Interval H&P Note (Signed)
History and Physical Interval Note:  06/23/2015 7:19 AM  [name]  has presented today for surgery, with the diagnosis of Left carotid artery stenosis I65.22  The various methods of treatment have been discussed with the patient and family. After consideration of risks, benefits and other options for treatment, the patient has consented to  Procedure(s): ENDARTERECTOMY CAROTID (Left) as a surgical intervention .  The patient's history has been reviewed, patient examined, no change in status, stable for surgery.  I have reviewed the patient's chart and labs.  Questions were answered to the patient's satisfaction.     Brabham, [place]  No interval change CV:RRR Pulm: CTA Neuro Intact Plan L CEA  Wells Brabham

## 2015-06-23 NOTE — Telephone Encounter (Signed)
LM for pt re appt, dpm °

## 2015-06-24 ENCOUNTER — Encounter (HOSPITAL_COMMUNITY): Payer: Self-pay | Admitting: Emergency Medicine

## 2015-06-24 ENCOUNTER — Encounter (HOSPITAL_COMMUNITY): Payer: Self-pay

## 2015-06-24 DIAGNOSIS — J069 Acute upper respiratory infection, unspecified: Secondary | ICD-10-CM | POA: Insufficient documentation

## 2015-06-24 DIAGNOSIS — J45901 Unspecified asthma with (acute) exacerbation: Secondary | ICD-10-CM | POA: Insufficient documentation

## 2015-06-24 DIAGNOSIS — M1990 Unspecified osteoarthritis, unspecified site: Secondary | ICD-10-CM | POA: Insufficient documentation

## 2015-06-24 DIAGNOSIS — J45909 Unspecified asthma, uncomplicated: Secondary | ICD-10-CM | POA: Insufficient documentation

## 2015-06-24 DIAGNOSIS — I2510 Atherosclerotic heart disease of native coronary artery without angina pectoris: Secondary | ICD-10-CM | POA: Insufficient documentation

## 2015-06-24 DIAGNOSIS — E785 Hyperlipidemia, unspecified: Secondary | ICD-10-CM | POA: Insufficient documentation

## 2015-06-24 DIAGNOSIS — G8929 Other chronic pain: Secondary | ICD-10-CM | POA: Insufficient documentation

## 2015-06-24 DIAGNOSIS — Z79899 Other long term (current) drug therapy: Secondary | ICD-10-CM | POA: Insufficient documentation

## 2015-06-24 DIAGNOSIS — Z880 Allergy status to penicillin: Secondary | ICD-10-CM | POA: Insufficient documentation

## 2015-06-24 DIAGNOSIS — I10 Essential (primary) hypertension: Secondary | ICD-10-CM | POA: Insufficient documentation

## 2015-06-24 DIAGNOSIS — I252 Old myocardial infarction: Secondary | ICD-10-CM | POA: Insufficient documentation

## 2015-06-24 DIAGNOSIS — Z9889 Other specified postprocedural states: Secondary | ICD-10-CM | POA: Insufficient documentation

## 2015-06-24 DIAGNOSIS — Z8673 Personal history of transient ischemic attack (TIA), and cerebral infarction without residual deficits: Secondary | ICD-10-CM | POA: Insufficient documentation

## 2015-06-24 DIAGNOSIS — Z794 Long term (current) use of insulin: Secondary | ICD-10-CM | POA: Insufficient documentation

## 2015-06-24 DIAGNOSIS — E119 Type 2 diabetes mellitus without complications: Secondary | ICD-10-CM | POA: Insufficient documentation

## 2015-06-24 DIAGNOSIS — Z7982 Long term (current) use of aspirin: Secondary | ICD-10-CM | POA: Insufficient documentation

## 2015-06-24 DIAGNOSIS — Z87891 Personal history of nicotine dependence: Secondary | ICD-10-CM | POA: Insufficient documentation

## 2015-06-24 DIAGNOSIS — Z87442 Personal history of urinary calculi: Secondary | ICD-10-CM | POA: Insufficient documentation

## 2015-06-24 DIAGNOSIS — Z9841 Cataract extraction status, right eye: Secondary | ICD-10-CM | POA: Insufficient documentation

## 2015-06-24 DIAGNOSIS — F329 Major depressive disorder, single episode, unspecified: Secondary | ICD-10-CM | POA: Insufficient documentation

## 2015-06-24 DIAGNOSIS — Z9861 Coronary angioplasty status: Secondary | ICD-10-CM | POA: Insufficient documentation

## 2015-06-24 DIAGNOSIS — Z7901 Long term (current) use of anticoagulants: Secondary | ICD-10-CM | POA: Insufficient documentation

## 2015-06-24 LAB — BASIC METABOLIC PANEL
ANION GAP: 9 (ref 5–15)
BUN: 14 mg/dL (ref 6–20)
CALCIUM: 9.1 mg/dL (ref 8.9–10.3)
CO2: 25 mmol/L (ref 22–32)
CREATININE: 0.92 mg/dL (ref 0.44–1.00)
Chloride: 107 mmol/L (ref 101–111)
GFR calc Af Amer: >60 mL/min (ref >60)
GFR calc non Af Amer: >60 mL/min (ref >60)
GLUCOSE: 115 mg/dL — AB (ref 65–99)
Potassium: 3.8 mmol/L (ref 3.5–5.1)
Sodium: 141 mmol/L (ref 135–145)

## 2015-06-24 LAB — CBC
HEMATOCRIT: 31.9 % — AB (ref 36.0–46.0)
Hemoglobin: 10.3 g/dL — ABNORMAL LOW (ref 12.0–15.0)
MCH: 27.2 pg (ref 26.0–34.0)
MCHC: 32.3 g/dL (ref 30.0–36.0)
MCV: 84.2 fL (ref 78.0–100.0)
Platelets: 279 10*3/uL (ref 150–400)
RBC: 3.79 MIL/uL — ABNORMAL LOW (ref 3.87–5.11)
RDW: 13.2 % (ref 11.5–15.5)
WBC: 8.4 10*3/uL (ref 4.0–10.5)

## 2015-06-24 LAB — GLUCOSE, CAPILLARY: Glucose-Capillary: 145 mg/dL — ABNORMAL HIGH (ref 65–99)

## 2015-06-24 MED ORDER — LINAGLIPTIN 5 MG PO TABS
5.0000 mg | ORAL_TABLET | Freq: Every day | ORAL | Status: DC
  Administered 2015-06-24: 5 mg via ORAL
  Filled 2015-06-24: qty 1

## 2015-06-24 MED ORDER — ALOGLIPTIN BENZOATE 25 MG PO TABS: 25.0000 mg | ORAL_TABLET | Freq: Every day | ORAL | Status: DC

## 2015-06-24 MED ORDER — OXYCODONE-ACETAMINOPHEN 5-325 MG PO TABS: 1.0000 | ORAL_TABLET | ORAL | Status: DC | PRN

## 2015-06-24 MED ORDER — CIPROFLOXACIN HCL 500 MG PO TABS: 500.0000 mg | ORAL_TABLET | Freq: Two times a day (BID) | ORAL | Status: AC

## 2015-06-24 NOTE — ED Notes (Signed)
Pt. reports SOB onset this evening , denies cough or congestion , no fever or chills, she was just discharged today - Carotid Endarterectomy surgery yesterday .

## 2015-06-24 NOTE — Progress Notes (Signed)
  [facility] Progress Note  Subjective  - POD #1  Minor pain with neck incision. Ready to go home.   Objective Filed Vitals:   06/24/15 0000 06/24/15 0400  BP:  142/79  Pulse: 72 73  Temp:  98.3 F (36.8 C)  Resp: 12 15    Intake/Output Summary (Last 24 hours) at 06/24/15 0728 Last data filed at 06/24/15 0400  Gross per 24 hour  Intake 1066.67 ml  Output    550 ml  Net 516.67 ml   Left neck incision c/d/i; no hematoma.  No smile asymmetry. Tongue is midline. Moving all extremities equally.   Assessment/Planning: 59 y.o. female is s/p: left carotid endarterectomy 1 Day Post-Op   Neuro exam intact. Left neck incision without hematoma. Has voided.  Will need to ambulate.  Discharge home today.  Modified rankin: 0 VQI: on ASA and statin  Raymond Gurney 06/24/2015 7:28 AM --  Laboratory CBC    Component Value Date/Time   WBC 8.4 06/24/2015 0345   WBC 6.5 05/24/2014 0038   HGB 10.3* 06/24/2015 0345   HGB 12.4 05/24/2014 0038   HCT 31.9* 06/24/2015 0345   HCT 37.5 05/24/2014 0038   PLT 279 06/24/2015 0345   PLT 349 05/24/2014 0038    BMET    Component Value Date/Time   NA 141 06/24/2015 0345   NA 140 05/24/2014 0038   K 3.8 06/24/2015 0345   K 3.9 05/24/2014 0038   CL 107 06/24/2015 0345   CL 105 05/24/2014 0038   CO2 25 06/24/2015 0345   CO2 26 05/24/2014 0038   GLUCOSE 115* 06/24/2015 0345   GLUCOSE 212* 05/24/2014 0038   BUN 14 06/24/2015 0345   BUN 13 05/24/2014 0038   CREATININE 0.92 06/24/2015 0345   CREATININE 0.86 05/24/2014 0038   CALCIUM 9.1 06/24/2015 0345   CALCIUM 8.7 05/24/2014 0038   GFRNONAA >60 06/24/2015 0345   GFRNONAA >60 05/24/2014 0038   GFRAA >60 06/24/2015 0345   GFRAA >60 05/24/2014 0038    COAG Lab Results  Component Value Date   INR 1.10 06/16/2015   INR 0.95 05/09/2012   INR 1.0 04/15/2012   No results found for: PTT  Antibiotics Anti-infectives    Start     Dose/Rate Route Frequency  Ordered Stop   06/23/15 1930  vancomycin (VANCOCIN) IVPB 1000 mg/200 mL premix     1,000 mg 200 mL/hr over 60 Minutes Intravenous Every 12 hours 06/23/15 1514 06/24/15 0722   06/23/15 1530  ciprofloxacin (CIPRO) tablet 500 mg     500 mg Oral 2 times daily 06/23/15 1514     06/23/15 0700  vancomycin (VANCOCIN) IVPB 1000 mg/200 mL premix     1,000 mg 200 mL/hr over 60 Minutes Intravenous To ShortStay Surgical 06/22/15 0835 06/23/15 0837       Maris Berger, PA-C [facility] Office: [phone] Pager: [phone] 06/24/2015 7:28 AM

## 2015-06-24 NOTE — Progress Notes (Signed)
Pt going home with daughter, discussed and explained discharge instructions, f/u appt. And prescriptions given. No complaints at this time.

## 2015-06-25 ENCOUNTER — Encounter (HOSPITAL_COMMUNITY): Payer: Self-pay | Admitting: Radiology

## 2015-06-25 ENCOUNTER — Emergency Department (HOSPITAL_COMMUNITY): Payer: Managed Care, Other (non HMO)

## 2015-06-25 ENCOUNTER — Emergency Department (HOSPITAL_COMMUNITY)
Admission: EM | Admit: 2015-06-25 | Discharge: 2015-06-25 | Disposition: A | Discharge status: Home / Self Care | Payer: Managed Care, Other (non HMO) | Attending: Emergency Medicine | Admitting: Emergency Medicine

## 2015-06-25 DIAGNOSIS — J069 Acute upper respiratory infection, unspecified: Secondary | ICD-10-CM

## 2015-06-25 DIAGNOSIS — R0602 Shortness of breath: Secondary | ICD-10-CM

## 2015-06-25 LAB — CBC
HEMATOCRIT: 35 % — AB (ref 36.0–46.0)
HEMOGLOBIN: 11.3 g/dL — AB (ref 12.0–15.0)
MCH: 27.4 pg (ref 26.0–34.0)
MCHC: 32.3 g/dL (ref 30.0–36.0)
MCV: 85 fL (ref 78.0–100.0)
Platelets: 307 10*3/uL (ref 150–400)
RBC: 4.12 MIL/uL (ref 3.87–5.11)
RDW: 13.1 % (ref 11.5–15.5)
WBC: 8.1 10*3/uL (ref 4.0–10.5)

## 2015-06-25 LAB — I-STAT TROPONIN, ED: TROPONIN I, POC: 0.01 ng/mL (ref 0.00–0.08)

## 2015-06-25 LAB — BASIC METABOLIC PANEL
ANION GAP: 8 (ref 5–15)
BUN: 13 mg/dL (ref 6–20)
CO2: 29 mmol/L (ref 22–32)
Calcium: 9.6 mg/dL (ref 8.9–10.3)
Chloride: 104 mmol/L (ref 101–111)
Creatinine, Ser: 1.04 mg/dL — ABNORMAL HIGH (ref 0.44–1.00)
GFR, EST NON AFRICAN AMERICAN: 58 mL/min — AB (ref >60)
GLUCOSE: 156 mg/dL — AB (ref 65–99)
POTASSIUM: 4.3 mmol/L (ref 3.5–5.1)
Sodium: 141 mmol/L (ref 135–145)

## 2015-06-25 LAB — D-DIMER, QUANTITATIVE (NOT AT ARMC): D DIMER QUANT: 1.33 ug{FEU}/mL — AB (ref 0.00–0.50)

## 2015-06-25 MED ORDER — ALBUTEROL SULFATE HFA 108 (90 BASE) MCG/ACT IN AERS
2.0000 | INHALATION_SPRAY | Freq: Once | RESPIRATORY_TRACT | Status: AC
  Administered 2015-06-25: 2 via RESPIRATORY_TRACT
  Filled 2015-06-25: qty 6.7

## 2015-06-25 MED ORDER — ALBUTEROL SULFATE (2.5 MG/3ML) 0.083% IN NEBU
5.0000 mg | INHALATION_SOLUTION | Freq: Once | RESPIRATORY_TRACT | Status: AC
  Administered 2015-06-25: 5 mg via RESPIRATORY_TRACT
  Filled 2015-06-25: qty 6

## 2015-06-25 MED ORDER — IOHEXOL 350 MG/ML SOLN
100.0000 mL | Freq: Once | INTRAVENOUS | Status: AC | PRN
  Administered 2015-06-25: 100 mL via INTRAVENOUS

## 2015-06-25 NOTE — Discharge Instructions (Signed)
Use nasal saline (you can try [employer] Simply Saline) at least 4 times a day, use saline 5-10 minutes before using the fluticasone (flonase) nasal spray ° °Do not use Afrin (Oxymetazoline) ° °Rest, wash hands frequently  and drink plenty of water. ° °You may try counter medication such as Mucinex or Sudafed decongestant. ° °Please follow with your primary care doctor in the next 2 days for a check-up. They must obtain records for further management.  ° °Do not hesitate to return to the Emergency Department for any new, worsening or concerning symptoms.  ° ° ° °

## 2015-06-25 NOTE — ED Provider Notes (Signed)
CSN: [id number]     Arrival date & time 06/24/15  2347 History   First MD Initiated Contact with Patient 06/25/15 0134     Chief Complaint  Patient presents with  . Shortness of Breath     (Consider location/radiation/quality/duration/timing/severity/associated sxs/prior Treatment) HPI   Blood pressure 172/76, pulse 70, temperature 98.1 F (36.7 C), temperature source Oral, resp. rate 20, SpO2 96 %.  [name] is a 59 y.o. female who is status post left carotid [place] 2 days ago, discharge from the hospital yesterday complaining of shortness of breath onset this morning. She denies increasing pain and swelling to the surgical site, cough, fever, chills. She states that she has rhinorrhea, she took Alka-Seltzer with some relief. Feels that she has something in her throat and is not able to cough it up. SOB comes in waves.   Past Medical History  Diagnosis Date  . CAD (coronary artery disease)     a. PCI [place] 2004. b. Angina - balloon angioplasty to LAD 04/21/12 (vessel too small for stent).  . Refusal of blood transfusions as patient is Jehovah's Witness   . HLD (hyperlipidemia)     a. takes Lipitor daily  . Asthma   . Migraine aura without headache     a. visual changes  . Chronic lower back pain   . Anxiety     a. with claustrophobia  . Carotid stenosis     a. 11/2011 critical R, ~30% L by angiogram;  b. 04/2012 s/p R CEA.  Marland Kitchen Hypertension, accelerated   . Peripheral neuropathy (HCC)   . History of kidney stones   . Diabetes mellitus type 2, uncontrolled (HCC)   . Cataract     left  . Depression     a. occasionally takes [place] john's wart  . History of echocardiogram     Echo 12/16: mild LVH, EF 55-60%, no RWMA, Gr 2 DD, trivial AI, MAC, mild MR, mild LAE  . History of nuclear stress test     a. Myoview 12/16: EF 46%, no ischemia, Intermediate Risk (low EF)  . Myocardial infarction [facility])     prior to 2013  . Embolic stroke (HCC)     a. 11/2011 MRI: mult small b/l  infarcts c/w emboli;  b. 11/2011 MRA neck: high grade RICA stenosis;  c. 6/20013 TEE: EF 55-60% with mild LVH, severe MAC, < 1 cm calc mobile structure on the MV annulus, ? 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 Thought was RICA stenosis did not explain the b/l small strokes; concern for emboli from mobile MV struct;  d. 01/2012 Event Mon:  no AFib  . Stroke (HCC) 2015- dec    mini stroke  . Arthritis     "right hand; maybe just starting in my left hand" (04/21/2012)   Past Surgical History  Procedure Laterality Date  . Kidney stone surgery      several times  . Carpal tunnel release  ~ 2005    bilateral  . Cataract extraction w/ intraocular lens implant  2012    right  . Tee without cardioversion  12/14/2011    Procedure: TRANSESOPHAGEAL ECHOCARDIOGRAM (TEE);  Surgeon: Laurey Morale, MD;  Location: [facility] ENDOSCOPY;  Service: Cardiovascular;  Laterality: N/A;  . Ankle surgery  2005    after fall down stairs, left with screws and plate in place  . Holter monitor  2013    WNL  . US echocardiography  2002  normal LV fxn, mild LVH, borderline L atrial size  . Lithotripsy    . Coronary angioplasty with stent placement  2002    x2  . Cardiac catheterization  2004    WNL, no significant coronary disease  . Cardiac catheterization  12/19/11  . Coronary angioplasty  04/21/2012    95% stenosis mid LAD, vessel not large enough for stent, rec plavix/ASA for 1 mo  . Endarterectomy  05/09/2012    Procedure: ENDARTERECTOMY CAROTID;  Surgeon: Nada Libman, MD;  Location: [facility] OR;  Service: Vascular;  Laterality: Right;  Right carotd endarterectomy with bovine patch angioplasty  . Carotid endarterectomy    . Carotid angiogram Bilateral 12/19/2011    Procedure: CAROTID ANGIOGRAM;  Surgeon: Nada Libman, MD;  Location: [facility] CATH LAB;  Service: Cardiovascular;  Laterality: Bilateral;  . Percutaneous coronary stent intervention (pci-s) N/A 04/21/2012    Procedure: PERCUTANEOUS  CORONARY STENT INTERVENTION (PCI-S);  Surgeon: Kathleene Hazel, MD;  Location: [facility] CATH LAB;  Service: Cardiovascular;  Laterality: N/A;  . Pars plana vitrectomy Left 04/2015    [facility])  . Eye surgery Left 04/2015    laser surgery,blood vessels rupturing  . Endarterectomy Left 06/23/2015    Procedure: ENDARTERECTOMY CAROTID;  Surgeon: Nada Libman, MD;  Location: [facility] OR;  Service: Vascular;  Laterality: Left;  . Patch angioplasty Left 06/23/2015    Procedure: PATCH ANGIOPLASTY USING 1cm x 4cm BOVINE PERICARDIAL PATCH;  Surgeon: Nada Libman, MD;  Location: [facility] OR;  Service: Vascular;  Laterality: Left;   Family History  Problem Relation Age of Onset  . Heart disease Mother     valve problems  . Stroke Mother   . Hypertension Mother   . Heart disease Sister     valve problems  . Heart disease Brother   . Diabetes Father   . Hypertension Father   . Cancer Neg Hx    Social History  Substance Use Topics  . Smoking status: Former Smoker -- 0.00 packs/day for 3 years    Types: Cigarettes    Quit date: 06/26/1975  . Smokeless tobacco: Never Used  . Alcohol Use: No     Comment: none since 11/2011   OB History    No data available     Review of Systems  10 systems reviewed and found to be negative, except as noted in the HPI.   Allergies  Other; Penicillins; Toradol; Isosorbide; and Metformin and related  Home Medications   Prior to Admission medications   Medication Sig Start Date End Date Taking? Authorizing Provider  Alogliptin Benzoate 25 MG TABS Take 25 mg by mouth daily. 06/24/15  Yes Nada Libman, MD  aspirin (ASPIRIN EC) 81 MG EC tablet Take 81 mg by mouth daily. Swallow whole.   Yes Historical Provider, MD  atorvastatin (LIPITOR) 40 MG tablet Take 1 tablet (40 mg total) by mouth daily. 05/26/15  Yes Scott Moishe Spice, PA-C  bisoprolol (ZEBETA) 5 MG tablet Take 1 tablet (5 mg total) by mouth daily. 05/26/15  Yes Scott Moishe Spice, PA-C  ciprofloxacin  (CIPRO) 500 MG tablet Take 1 tablet (500 mg total) by mouth 2 (two) times daily. 06/24/15 06/30/15 Yes Raymond Gurney, PA-C  clopidogrel (PLAVIX) 75 MG tablet Take 1 tablet (75 mg total) by mouth daily. 06/13/15  Yes Beatrice Lecher, PA-C  Insulin Glargine (LANTUS SOLOSTAR) 100 UNIT/ML Solostar Pen Inject 30 Units into the skin daily at 10 pm. 06/03/15  Yes Carlus Pavlov,  MD  lisinopril (PRINIVIL,ZESTRIL) 20 MG tablet Take 1 tablet (20 mg total) by mouth daily. 05/10/15  Yes Eustaquio Boyden, MD  nitroGLYCERIN (NITROSTAT) 0.4 MG SL tablet Place 0.4 mg under the tongue every 5 (five) minutes as needed. For chest pain   Yes Historical Provider, MD  oxyCODONE-acetaminophen (PERCOCET/ROXICET) 5-325 MG tablet Take 1-2 tablets by mouth every 4 (four) hours as needed for moderate pain. 06/24/15  Yes Raymond Gurney, PA-C  Turmeric 500 MG CAPS Take 1 capsule by mouth daily.   Yes Historical Provider, MD  Alogliptin Benzoate 25 MG TABS Take 1 tablet daily in am - GENERIC Patient not taking: Reported on 06/25/2015 06/07/15   Carlus Pavlov, MD  Insulin Pen Needle (BD PEN NEEDLE NANO U/F) 32G X 4 MM MISC Use 1x a dau 06/03/15   Carlus Pavlov, MD   BP 122/95 mmHg  Pulse 74  Temp(Src) 98.4 F (36.9 C) (Oral)  Resp 17  SpO2 95% Physical Exam  Constitutional: She is oriented to person, place, and time. She appears well-developed and well-nourished. No distress.  HENT:  Head: Normocephalic.  Mouth/Throat: Oropharynx is clear and moist.  + rhinnorhea   Eyes: Conjunctivae and EOM are normal. Pupils are equal, round, and reactive to light.  Neck: Normal range of motion. Neck supple. No JVD present. No tracheal deviation present.  Incision to left neck C/D/I no significant swelling   Cardiovascular: Normal rate, regular rhythm and intact distal pulses.   Pulmonary/Chest: Effort normal and breath sounds normal. No stridor. No respiratory distress. She has no wheezes. She has no rales. She exhibits no  tenderness.  Abdominal: Soft. She exhibits no distension and no mass. There is no tenderness. There is no rebound and no guarding.  Musculoskeletal: Normal range of motion. She exhibits no edema or tenderness.  No calf asymmetry, superficial collaterals, palpable cords, edema, Homans sign negative bilaterally.    Neurological: She is alert and oriented to person, place, and time.  Skin: Skin is warm. She is not diaphoretic.  Psychiatric: She has a normal mood and affect.  Nursing note and vitals reviewed.   ED Course  Procedures (including critical care time) Labs Review Labs Reviewed  BASIC METABOLIC PANEL - Abnormal; Notable for the following:    Glucose, Bld 156 (*)    Creatinine, Ser 1.04 (*)    GFR calc non Af Amer 58 (*)    All other components within normal limits  CBC - Abnormal; Notable for the following:    Hemoglobin 11.3 (*)    HCT 35.0 (*)    All other components within normal limits  D-DIMER, QUANTITATIVE (NOT AT [facility]) - Abnormal; Notable for the following:    D-Dimer, Quant 1.33 (*)    All other components within normal limits  I-STAT TROPOININ, ED    Imaging Review Dg Chest 2 View  06/25/2015  CLINICAL DATA:  Acute onset of severe shortness of breath. Recent carotid endarterectomy. Initial encounter. EXAM: CHEST  2 VIEW COMPARISON:  Chest radiograph performed 05/24/2014 FINDINGS: The lungs are well-aerated. Mild peribronchial thickening is noted. There is no evidence of focal opacification, pleural effusion or pneumothorax. The heart is normal in size; the mediastinal contour is within normal limits. No acute osseous abnormalities are seen. IMPRESSION: Mild peribronchial thickening noted.  Lungs otherwise clear. Electronically Signed   By: Roanna Raider M.D.   On: 06/25/2015 00:41   Ct Angio Chest Pe W/cm &/or Wo Cm  06/25/2015  CLINICAL DATA:  Acute onset of shortness  of breath and chest tightness. Elevated D-dimer. Status post recent carotid endarterectomy.  Initial encounter. EXAM: CT ANGIOGRAPHY CHEST WITH CONTRAST TECHNIQUE: Multidetector CT imaging of the chest was performed using the standard protocol during bolus administration of intravenous contrast. Multiplanar CT image reconstructions and MIPs were obtained to evaluate the vascular anatomy. CONTRAST:  OMNIPAQUE IOHEXOL 350 MG/ML SOLN COMPARISON:  Chest radiograph performed earlier today at 12:21 a.m. FINDINGS: There is no evidence of pulmonary embolus. Bilateral hazy opacities and interstitial prominence are seen, raising question for mild interstitial edema. There is no evidence of pleural effusion or pneumothorax. No masses are identified; no abnormal focal contrast enhancement is seen. No mediastinal lymphadenopathy is seen. No pericardial effusion is identified. Scattered coronary artery calcifications are seen. Dense calcification is noted at the mitral valve. Mild calcification is noted at the proximal great vessels. No axillary lymphadenopathy is seen. The visualized portions of the thyroid gland are unremarkable in appearance. Mild postoperative soft tissue air and soft tissue edema is noted at the left side of the neck. The visualized portions of the liver and spleen are unremarkable. The visualized portions of the pancreas, gallbladder, stomach, adrenal glands and kidneys are within normal limits. No acute osseous abnormalities are seen. Review of the MIP images confirms the above findings. IMPRESSION: 1. No evidence of pulmonary embolus. 2. Bilateral hazy opacities and interstitial prominence, raising question for mild interstitial edema. 3. Scattered coronary artery calcifications seen. Dense calcification at the mitral valve. 4. Mild postoperative soft tissue air and soft tissue edema at the left side of the neck. Electronically Signed   By: Roanna Raider M.D.   On: 06/25/2015 04:16   I have personally reviewed and evaluated these images and lab results as part of my medical  decision-making.   EKG Interpretation None      MDM   Final diagnoses:  URI (upper respiratory infection)  SOB (shortness of breath)    Filed Vitals:   06/25/15 0007 06/25/15 0200 06/25/15 0315 06/25/15 0427  BP: 172/76 166/64 138/63 122/95  Pulse: 70 65 78 74  Temp: 98.1 F (36.7 C)   98.4 F (36.9 C)  TempSrc: Oral   Oral  Resp: 20 16 16 17   SpO2: 96% 96% 95% 95%    Medications  albuterol (PROVENTIL) (2.5 MG/3ML) 0.083% nebulizer solution 5 mg (5 mg Nebulization Given 06/25/15 0217)  iohexol (OMNIPAQUE) 350 MG/ML injection 100 mL (100 mLs Intravenous Contrast Given 06/25/15 0353)  albuterol (PROVENTIL HFA;VENTOLIN HFA) 108 (90 Base) MCG/ACT inhaler 2 puff (2 puffs Inhalation Given 06/25/15 0437)    NIMAH ILL is 59 y.o. female presenting with shortness of breath, coming over in waves, lung sounds clear to auscultation bilaterally, patient is saturating well on room air. Patient recently had a carotid endarterectomy, does not appear to be any expansion or swelling at the surgical site. Patient was seen by Dr. Imogene Burn and, as per note, cleared from vascular perspective. EKG with no acute changes, chest x-ray with no infiltrate, blood work including troponin reassuring. Patient does not appear clinically volume overloaded. Patient has rhinorrhea and is reporting nasal congestion, this may be related to URI. Dimer is elevated, will obtain CT to rule out PE.   Patient reports resolution of her symptoms and ability to produce mucus after nebulizer treatment. Patient states she would like to skip the CAT scan, after extensive discussion she agrees to stay.  CT negative, patient remains comfortable and would like to go home.  Evaluation does not show  pathology that would require ongoing emergent intervention or inpatient treatment. Pt is hemodynamically stable and mentating appropriately. Discussed findings and plan with patient/guardian, who agrees with care plan. All questions  answered. Return precautions discussed and outpatient follow up given.     Wynetta Emery, PA-C 06/25/15 0455  Gilda Crease, MD 06/25/15 [phone]

## 2015-06-25 NOTE — ED Notes (Signed)
Pt transported to CT ?

## 2015-06-25 NOTE — ED Notes (Signed)
Pt verbalized understanding of d/c instructions and follow-up care. No further questions/concerns, VSS, ambulatory w/ steady gait (refused wheelchair) 

## 2015-06-25 NOTE — Progress Notes (Signed)
   Daily Progress Note  Assessment/Planning: POD #2 s/p L CEA   Doubt the neck hematoma is the cause of this patient's SOB  Without history of R recurrent laryngeal nerve palsy, I doubt injury to the L recurrent laryngeal nerve is the cause of her SOB.  Her sx are more consistent with possible laryngeal spasm vs epiglottis vs URI  At this point, there are not findings consistent with acute airway compromise  Pt has scheduled follow up with Dr. Myra Gianotti already in place   Subjective   SOB earlier with audible upper airway sounds  Objective Filed Vitals:   06/25/15 0007  BP: 172/76  Pulse: 70  Temp: 98.1 F (36.7 C)  TempSrc: Oral  Resp: 20  SpO2: 96%   No intake or output data in the 24 hours ending 06/25/15 0039  NECK inc c/d/i, mild-mod amount hematoma without any tension PULM  CTAB, no stridor, mid-line trachea NEURO CN intact with sym motor  Laboratory CBC    Component Value Date/Time   WBC 8.1 06/25/2015 0014   WBC 6.5 05/24/2014 0038   HGB 11.3* 06/25/2015 0014   HGB 12.4 05/24/2014 0038   HCT 35.0* 06/25/2015 0014   HCT 37.5 05/24/2014 0038   PLT 307 06/25/2015 0014   PLT 349 05/24/2014 0038    BMET    Component Value Date/Time   NA 141 06/24/2015 0345   NA 140 05/24/2014 0038   K 3.8 06/24/2015 0345   K 3.9 05/24/2014 0038   CL 107 06/24/2015 0345   CL 105 05/24/2014 0038   CO2 25 06/24/2015 0345   CO2 26 05/24/2014 0038   GLUCOSE 115* 06/24/2015 0345   GLUCOSE 212* 05/24/2014 0038   BUN 14 06/24/2015 0345   BUN 13 05/24/2014 0038   CREATININE 0.92 06/24/2015 0345   CREATININE 0.86 05/24/2014 0038   CALCIUM 9.1 06/24/2015 0345   CALCIUM 8.7 05/24/2014 0038   GFRNONAA >60 06/24/2015 0345   GFRNONAA >60 05/24/2014 0038   GFRAA >60 06/24/2015 0345   GFRAA >60 05/24/2014 0038    Leonides Sake, MD [facility] Office: [phone] Pager: [phone]  06/25/2015, 12:39 AM

## 2015-06-25 NOTE — ED Notes (Signed)
Pt returned from CT °

## 2015-07-01 ENCOUNTER — Encounter: Payer: Self-pay | Admitting: Surgery

## 2015-07-04 NOTE — Discharge Summary (Signed)
[facility] Discharge Summary  [name] Oct 28, 1955 60 y.o. female  [id number]  Admission Date: 06/23/2015  Discharge Date: 06/24/2015  Physician: Harold Barban, MD  Admission Diagnosis: Left carotid artery stenosis I65.22  HPI:   This is a 60 y.o. female who Dr. Trula Slade initially met in 2013 when she presented to the hospital with confusion and short-term memory loss. She was amnestic to these events. She had an MRI which showed multiple small areas of acute infarct, bilaterally. She had a TEE which suggested a possible cardiac source. In addition, Dr. Trula Slade performed carotid angiography which revealed approximately 50% left carotid stenosis and 80% right carotid stenosis. Ultimately, the patient underwent a right carotid endarterectomy on 05/09/2012. Intraoperative findings were an 85% lesion. The patient did not return for follow-up.  In December 2015, she was admitted to [facility] with a left facial droop. Reportedly, she was diagnosed with a stroke. She was seen by vascular surgery but elected not to have a left carotid intervention. She comes in today for follow-up. She has not had any symptoms consistent with left carotid stenosis.  Patient has a significant cardiac history. She had a 95% mid LAD stenosis treated with angioplasty in 2013 which preceded her carotid endarterectomy. She had an event monitor which did not demonstrate any atrial fibrillation.  The patient has recently undergone laser eye surgery for a vitreous hemorrhage. She is now back on aspirin. The patient has a history of diabetes which is poorly controlled. She is a former smoker having quit in 1977. She suffers from hypercholesterolemia which is managed with a statin. Her blood pressure is managed with an ACE inhibitor.  Hospital Course:  The patient was admitted to the hospital and taken to the operating room on 06/23/2015 and underwent left  carotid endarterectomy.  The patient tolerated the procedure well and was transported to the PACU in stable condition.  By POD 1, the patient's neuro status was intact. Her left neck incision was clean and intact without evidence of swelling or hematoma. She had ambulated, voided and tolerated a diet without difficulty. She was discharged home on POD 1 in good condition.   Discharge Instructions:   The patient is discharged to home with extensive instructions on wound care and progressive ambulation.  They are instructed not to drive or perform any heavy lifting until returning to see the physician in his office.  Discharge Instructions    CAROTID Sugery: Call MD for difficulty swallowing or speaking; weakness in arms or legs that is a new symtom; severe headache.  If you have increased swelling in the neck and/or  are having difficulty breathing, CALL [phone]    Complete by:  As directed      Call MD for:  redness, tenderness, or signs of infection (pain, swelling, bleeding, redness, odor or green/yellow discharge around incision site)    Complete by:  As directed      Call MD for:  severe or increased pain, loss or decreased feeling  in affected limb(s)    Complete by:  As directed      Call MD for:  temperature >100.5    Complete by:  As directed      Discharge wound care:    Complete by:  As directed   Wash left neck wound daily with soap and water and pat dry.     Driving Restrictions    Complete by:  As directed   No driving for 2 weeks  Increase activity slowly    Complete by:  As directed   Walk with assistance use walker or cane as needed     Lifting restrictions    Complete by:  As directed   No lifting for 2 weeks     Resume previous diet    Complete by:  As directed            Discharge Diagnosis:  Left carotid artery stenosis I65.22  Secondary Diagnosis: Patient Active Problem List   Diagnosis Date Noted  . Carotid stenosis, asymptomatic 06/23/2015  . Mitral valve  annular calcification 05/11/2015  . Uncontrolled type 2 diabetes mellitus with circulatory disorder, with long-term current use of insulin ([place]) 05/10/2015  . Breast mass, left 01/10/2012  . Carotid stenosis 12/30/2011  . Hyperlipidemia 12/30/2011  . Embolic stroke ([place]) 40/98/1191  . Hypertension, accelerated 12/12/2011  . CAD (coronary artery disease) 12/12/2011   Past Medical History  Diagnosis Date  . CAD (coronary artery disease)     a. PCI [place] 2004. b. Angina - balloon angioplasty to LAD 04/21/12 (vessel too small for stent).  . Refusal of blood transfusions as patient is Jehovah's Witness   . HLD (hyperlipidemia)     a. takes Lipitor daily  . Asthma   . Migraine aura without headache     a. visual changes  . Chronic lower back pain   . Anxiety     a. with claustrophobia  . Carotid stenosis     a. 11/2011 critical R, ~30% L by angiogram;  b. 04/2012 s/p R CEA.  Marland Kitchen Hypertension, accelerated   . Peripheral neuropathy ([place])   . History of kidney stones   . Diabetes mellitus type 2, uncontrolled ([place])   . Cataract     left  . Depression     a. occasionally takes st john's wart  . History of echocardiogram     Echo 12/16: mild LVH, EF 55-60%, no RWMA, Gr 2 DD, trivial AI, MAC, mild MR, mild LAE  . History of nuclear stress test     a. Myoview 12/16: EF 46%, no ischemia, Intermediate Risk (low EF)  . Myocardial infarction [facility])     prior to 2013  . Embolic stroke ([place])     a. 11/2011 MRI: mult small b/l infarcts c/w emboli;  b. 11/2011 MRA neck: high grade RICA stenosis;  c. 6/20013 TEE: EF 55-60% with mild LVH, severe MAC, < 1 cm calc mobile structure on the MV annulus, ? 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 Thought was RICA stenosis did not explain the b/l small strokes; concern for emboli from mobile MV struct;  d. 01/2012 Event Mon:  no AFib  . Stroke ([place]) 2015- dec    mini stroke  . Arthritis     "right hand; maybe just starting in my left hand"  (04/21/2012)      Medication List    STOP taking these medications        ciprofloxacin 500 MG tablet  Commonly known as:  CIPRO      TAKE these medications        Alogliptin Benzoate 25 MG Tabs  Take 1 tablet daily in am - GENERIC     Alogliptin Benzoate 25 MG Tabs  Take 25 mg by mouth daily.     aspirin EC 81 MG EC tablet  Generic drug:  aspirin  Take 81 mg by mouth daily. Swallow whole.     atorvastatin 40 MG tablet  Commonly known as:  LIPITOR  Take 1 tablet (40 mg total) by mouth daily.     bisoprolol 5 MG tablet  Commonly known as:  ZEBETA  Take 1 tablet (5 mg total) by mouth daily.     clopidogrel 75 MG tablet  Commonly known as:  PLAVIX  Take 1 tablet (75 mg total) by mouth daily.     Insulin Glargine 100 UNIT/ML Solostar Pen  Commonly known as:  LANTUS SOLOSTAR  Inject 30 Units into the skin daily at 10 pm.     Insulin Pen Needle 32G X 4 MM Misc  Commonly known as:  BD PEN NEEDLE NANO U/F  Use 1x a dau     lisinopril 20 MG tablet  Commonly known as:  PRINIVIL,ZESTRIL  Take 1 tablet (20 mg total) by mouth daily.     nitroGLYCERIN 0.4 MG SL tablet  Commonly known as:  NITROSTAT  Place 0.4 mg under the tongue every 5 (five) minutes as needed. For chest pain     oxyCODONE-acetaminophen 5-325 MG tablet  Commonly known as:  PERCOCET/ROXICET  Take 1-2 tablets by mouth every 4 (four) hours as needed for moderate pain.     Turmeric 500 MG Caps  Take 1 capsule by mouth daily.        Percocet #20 No Refill  Disposition: Home  Patient's condition: is Good  Follow up: 1. Dr.  Trula Slade in 2 weeks.   Virgina Jock, PA-C [facility] [phone]  --- For [facility] use --- Instructions: Press F2 to tab through selections.  Delete question if not applicable.   Modified Rankin score at D/C (0-6): 0  IV medication needed for:  1. Hypertension: Yes 2. Hypotension: No  Post-op Complications: No  1. Post-op CVA or TIA:  No  2. CN injury: No  3. Myocardial infarction: No  4.  CHF: No  5.  Dysrhythmia (new): No  6. Wound infection: No  7. Reperfusion symptoms: No  8. Return to OR: No   Discharge medications: Statin use:  Yes If No: '[ ]'  For Medical reasons, '[ ]'  Non-compliant, '[ ]'  Not-indicated ASA use:  Yes  If No: '[ ]'  For Medical reasons, '[ ]'  Non-compliant, '[ ]'  Not-indicated Beta blocker use:  Yes If No: '[ ]'  For Medical reasons, '[ ]'  Non-compliant, '[ ]'  Not-indicated ACE-Inhibitor use:  Yes If No: '[ ]'  For Medical reasons, '[ ]'  Non-compliant, '[ ]'  Not-indicated P2Y12 Antagonist use: Yes, '[ ]'  Plavix, '[ ]'  Plasugrel, '[ ]'  Ticlopinine, '[ ]'  Ticagrelor, '[ ]'  Other, '[ ]'  No for medical reason, '[ ]'  Non-compliant, '[ ]'  Not-indicated Anti-coagulant use:  No, '[ ]'  Warfarin, '[ ]'  Rivaroxaban, '[ ]'  Dabigatran, '[ ]'  Other, '[ ]'  No for medical reason, '[ ]'  Non-compliant, [x ] Not-indicated

## 2015-07-11 ENCOUNTER — Encounter: Payer: Self-pay | Admitting: Surgery

## 2015-07-11 ENCOUNTER — Ambulatory Visit (INDEPENDENT_AMBULATORY_CARE_PROVIDER_SITE_OTHER): Payer: Managed Care, Other (non HMO) | Admitting: Surgery

## 2015-07-11 VITALS — BP 125/66 | HR 42 | Temp 97.8°F | Ht 66.0 in | Wt 199.2 lb

## 2015-07-11 DIAGNOSIS — I6522 Occlusion and stenosis of left carotid artery: Secondary | ICD-10-CM

## 2015-07-11 NOTE — Addendum Note (Signed)
Addended by: Adria Dill L on: 07/11/2015 12:15 PM   Modules accepted: Orders

## 2015-07-11 NOTE — Progress Notes (Signed)
Patient name: [name] MRN: [id number] DOB: 05/05/1956 Sex: female     Chief Complaint  Patient presents with  . Routine Post Op    2 wk f/u, s/p L CEA    HISTORY OF PRESENT ILLNESS: Patient is back for follow-up.  She is status post left carotid endarterectomy on 06/23/2015 for asymptomatic stenosis.  Intraoperative findings included an 80% stenosis.  She has been doing well without complaints.  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   We initially met in 2013 when she presented to the hospital with confusion and short-term memory loss. She was amnestic to these events. She had an MRI which showed multiple small areas of acute infarct, bilaterally. She had a TEE which suggested a possible cardiac source. In addition, I performed carotid angiography which revealed approximately 50% left carotid stenosis and 80% right carotid stenosis. Ultimately, the patient underwent a right carotid endarterectomy on 05/09/2012. Intraoperative findings were an 85% lesion. The patient did not return for follow-up.  In December 2015, she was admitted to [facility] with a left facial droop. Reportedly, she was diagnosed with a stroke. She was seen by vascular surgery but elected not to have a left carotid intervention. She comes in today for follow-up. She has not had any symptoms consistent with left carotid stenosis.  Patient has a significant cardiac history. She had a 95% mid LAD stenosis treated with angioplasty in 2013 which preceded her carotid endarterectomy. She had an event monitor which did not demonstrate any atrial fibrillation.  The patient has recently undergone laser eye surgery for a vitreous hemorrhage. She is now back on aspirin. The patient has a history of diabetes which is poorly controlled. She is a former smoker having quit in 1977. She suffers from hypercholesterolemia  which is managed with a statin. Her blood pressure is managed with an ACE inhibitor.  Past Medical History  Diagnosis Date  . CAD (coronary artery disease)     a. PCI [place] 2004. b. Angina - balloon angioplasty to LAD 04/21/12 (vessel too small for stent).  . Refusal of blood transfusions as patient is Jehovah's Witness   . HLD (hyperlipidemia)     a. takes Lipitor daily  . Asthma   . Migraine aura without headache     a. visual changes  . Chronic lower back pain   . Anxiety     a. with claustrophobia  . Carotid stenosis     a. 11/2011 critical R, ~30% L by angiogram;  b. 04/2012 s/p R CEA.  Marland Kitchen Hypertension, accelerated   . Peripheral neuropathy ([place])   . History of kidney stones   . Diabetes mellitus type 2, uncontrolled ([place])   . Cataract     left  . Depression     a. occasionally takes st john's wart  . History of echocardiogram     Echo 12/16: mild LVH, EF 55-60%, no RWMA, Gr 2 DD, trivial AI, MAC, mild MR, mild LAE  . History of nuclear stress test     a. Myoview 12/16: EF 46%, no ischemia, Intermediate Risk (low EF)  . Myocardial infarction [facility])     prior to 2013  . Embolic stroke ([place])     a. 11/2011 MRI: mult small b/l infarcts c/w emboli;  b. 11/2011 MRA neck: high grade RICA stenosis;  c. 6/20013 TEE: EF 55-60% with mild LVH, severe MAC, < 1  cm calc mobile structure on the MV annulus, ? 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 Thought was RICA stenosis did not explain the b/l small strokes; concern for emboli from mobile MV struct;  d. 01/2012 Event Mon:  no AFib  . Stroke ([place]) 2015- dec    mini stroke  . Arthritis     "right hand; maybe just starting in my left hand" (04/21/2012)    Past Surgical History  Procedure Laterality Date  . Kidney stone surgery      several times  . Carpal tunnel release  ~ 2005    bilateral  . Cataract extraction w/ intraocular lens implant  2012    right  . Tee without cardioversion  12/14/2011    Procedure:  TRANSESOPHAGEAL ECHOCARDIOGRAM (TEE);  Surgeon: Larey Dresser, MD;  Location: [place];  Service: Cardiovascular;  Laterality: N/A;  . Ankle surgery  2005    after fall down stairs, left with screws and plate in place  . Holter monitor  2013    WNL  . US echocardiography  2002    normal LV fxn, mild LVH, borderline L atrial size  . Lithotripsy    . Coronary angioplasty with stent placement  2002    x2  . Cardiac catheterization  2004    WNL, no significant coronary disease  . Cardiac catheterization  12/19/11  . Coronary angioplasty  04/21/2012    95% stenosis mid LAD, vessel not large enough for stent, rec plavix/ASA for 1 mo  . Endarterectomy  05/09/2012    Procedure: ENDARTERECTOMY CAROTID;  Surgeon: Serafina Mitchell, MD;  [facility] OR;  Service: Vascular;  Laterality: Right;  Right carotd endarterectomy with bovine patch angioplasty  . Carotid endarterectomy    . Carotid angiogram Bilateral 12/19/2011    Procedure: CAROTID ANGIOGRAM;  Surgeon: Serafina Mitchell, MD;  Location: [facility] CATH LAB;  Service: Cardiovascular;  Laterality: Bilateral;  . Percutaneous coronary stent intervention (pci-s) N/A 04/21/2012    Procedure: PERCUTANEOUS CORONARY STENT INTERVENTION (PCI-S);  Surgeon: Burnell Blanks, MD;  Location: [facility] CATH LAB;  Service: Cardiovascular;  Laterality: N/A;  . Pars plana vitrectomy Left 04/2015    [facility] Dr Danella Sensing)  . Eye surgery Left 04/2015    laser surgery,blood vessels rupturing  . Endarterectomy Left 06/23/2015    Procedure: ENDARTERECTOMY CAROTID;  Surgeon: Serafina Mitchell, MD;  Location: [place];  Service: Vascular;  Laterality: Left;  . Patch angioplasty Left 06/23/2015    Procedure: PATCH ANGIOPLASTY USING 1cm x 4cm BOVINE PERICARDIAL PATCH;  Surgeon: Serafina Mitchell, MD;  Location: [facility] OR;  Service: Vascular;  Laterality: Left;    Social History   Social History  . Marital Status: Married    Spouse Name: N/A  . Number of Children: N/A  . Years of  Education: N/A   Occupational History  . Not on file.   Social History Main Topics  . Smoking status: Former Smoker -- 0.00 packs/day for 3 years    Types: Cigarettes    Quit date: 06/26/1975  . Smokeless tobacco: Never Used  . Alcohol Use: No     Comment: none since 11/2011  . Drug Use: No  . Sexual Activity: Yes    Birth Control/ Protection: Post-menopausal   Other Topics Concern  . Not on file   Social History Narrative   Caffeine: 2 cups/day coffee   Lives alone, husband in [place].  4 children nearby, son to live with her.  Occupation: unemployed, prior worked as Radio broadcast assistant   Activity: stays active on farm   Diet: vegan, no processed foods, good water daily, fruits/vegetables daily   jehovah's witness - no blood products.    Family History  Problem Relation Age of Onset  . Heart disease Mother     valve problems  . Stroke Mother   . Hypertension Mother   . Heart disease Sister     valve problems  . Heart disease Brother   . Diabetes Father   . Hypertension Father   . AAA (abdominal aortic aneurysm) Father   . Cancer Neg Hx     Allergies as of 07/11/2015 - Review Complete 07/11/2015  Allergen Reaction Noted  . Other Shortness Of Breath 05/09/2012  . Penicillins Itching and Other (See Comments) 12/12/2011  . Toradol [ketorolac tromethamine] Other (See Comments) 04/14/2012  . Isosorbide Other (See Comments) 04/21/2012  . Metformin and related Diarrhea and Nausea And Vomiting 01/09/2012    Current Outpatient Prescriptions on File Prior to Visit  Medication Sig Dispense Refill  . Alogliptin Benzoate 25 MG TABS Take 1 tablet daily in am - GENERIC 30 tablet 2  . Alogliptin Benzoate 25 MG TABS Take 25 mg by mouth daily. 30 tablet 0  . aspirin (ASPIRIN EC) 81 MG EC tablet Take 81 mg by mouth daily. Swallow whole.    Marland Kitchen atorvastatin (LIPITOR) 40 MG tablet Take 1 tablet (40 mg total) by mouth daily. 30 tablet 11  . bisoprolol (ZEBETA) 5 MG tablet Take 1 tablet (5 mg  total) by mouth daily. 30 tablet 11  . clopidogrel (PLAVIX) 75 MG tablet Take 1 tablet (75 mg total) by mouth daily. 30 tablet 11  . Insulin Glargine (LANTUS SOLOSTAR) 100 UNIT/ML Solostar Pen Inject 30 Units into the skin daily at 10 pm. 5 pen 2  . Insulin Pen Needle (BD PEN NEEDLE NANO U/F) 32G X 4 MM MISC Use 1x a dau 100 each 11  . lisinopril (PRINIVIL,ZESTRIL) 20 MG tablet Take 1 tablet (20 mg total) by mouth daily. 90 tablet 3  . nitroGLYCERIN (NITROSTAT) 0.4 MG SL tablet Place 0.4 mg under the tongue every 5 (five) minutes as needed. For chest pain    . Turmeric 500 MG CAPS Take 1 capsule by mouth daily.    Marland Kitchen oxyCODONE-acetaminophen (PERCOCET/ROXICET) 5-325 MG tablet Take 1-2 tablets by mouth every 4 (four) hours as needed for moderate pain. (Patient not taking: Reported on 07/11/2015) 15 tablet 0   No current facility-administered medications on file prior to visit.     REVIEW OF SYSTEMS: No changes from prior visit  PHYSICAL EXAMINATION:   Vital signs are  Filed Vitals:   07/11/15 0851 07/11/15 0855  BP: 125/56 125/66  Pulse: 42   Temp: 97.8 F (36.6 C)   TempSrc: Oral   Height: '5\' 6"'  (1.676 m)   Weight: 199 lb 3.2 oz (90.357 kg)   SpO2: 98%    Body mass index is 32.17 kg/(m^2). General: The patient appears their stated age. Incision has healed nicely.  She is neurologically intact.  There is a mild neuropraxia on the left. Diagnostic Studies None  Assessment: Status post left carotid endarterectomy Plan: She continues to do very well.  I will have her follow up in 9 months with a carotid duplex  V. Leia Alf, M.D. [facility] [place] Office: [phone] Pager:  [phone]

## 2015-07-28 ENCOUNTER — Ambulatory Visit: Payer: Managed Care, Other (non HMO) | Admitting: Internal Medicine

## 2015-09-13 ENCOUNTER — Ambulatory Visit: Payer: Managed Care, Other (non HMO) | Admitting: Internal Medicine

## 2015-09-23 ENCOUNTER — Ambulatory Visit: Payer: Managed Care, Other (non HMO) | Admitting: Cardiology

## 2015-10-24 HISTORY — PX: CATARACT EXTRACTION W/PHACO: SHX586

## 2015-11-08 ENCOUNTER — Ambulatory Visit (INDEPENDENT_AMBULATORY_CARE_PROVIDER_SITE_OTHER): Payer: Managed Care, Other (non HMO) | Admitting: Internal Medicine

## 2015-11-08 ENCOUNTER — Other Ambulatory Visit (INDEPENDENT_AMBULATORY_CARE_PROVIDER_SITE_OTHER): Payer: Managed Care, Other (non HMO) | Admitting: *Deleted

## 2015-11-08 ENCOUNTER — Encounter: Payer: Self-pay | Admitting: Internal Medicine

## 2015-11-08 VITALS — BP 132/80 | HR 79 | Temp 99.3°F | Resp 12 | Wt 197.8 lb

## 2015-11-08 DIAGNOSIS — Z794 Long term (current) use of insulin: Secondary | ICD-10-CM

## 2015-11-08 DIAGNOSIS — E1159 Type 2 diabetes mellitus with other circulatory complications: Secondary | ICD-10-CM | POA: Diagnosis not present

## 2015-11-08 DIAGNOSIS — E1165 Type 2 diabetes mellitus with hyperglycemia: Secondary | ICD-10-CM

## 2015-11-08 DIAGNOSIS — IMO0002 Reserved for concepts with insufficient information to code with codable children: Secondary | ICD-10-CM

## 2015-11-08 LAB — POCT GLYCOSYLATED HEMOGLOBIN (HGB A1C): Hemoglobin A1C: 8.7

## 2015-11-08 MED ORDER — INSULIN GLARGINE 100 UNIT/ML SOLOSTAR PEN: 24.0000 [IU] | PEN_INJECTOR | Freq: Every day | SUBCUTANEOUS | Status: DC

## 2015-11-08 NOTE — Patient Instructions (Addendum)
Please decrease: - Lantus to 24 units at bedtime  Please continue: - Alogliptin 25 mg in am, before breakfast  Try to read Dr. Katherina Right book: "Program for Reversing Diabetes" for the vegan concept and other ideas for healthy eating.  Please return in 3 months with your sugar log.

## 2015-11-08 NOTE — Progress Notes (Signed)
Patient ID: [name], female   DOB: [birth year], 60 y.o.   MRN: [id number]  HPI: [name] is a 60 y.o.-year-old female, returning for f/u for DM2, dx in 1991, insulin-dependent since 1990s, uncontrolled, with complications (CAD, carotid stenosis, cerebrovascular disease, proliferative DR, PN). Last visit 5 mo ago.  She will start a raw vegan diet this week.  Last hemoglobin A1c was: Lab Results  Component Value Date   HGBA1C 8.3* 05/10/2015   HGBA1C 8.9* 05/24/2014   HGBA1C 9.9* 05/09/2012  Prev. 11.6% Prev. 12.3% She did not have insurance for 8-9 years >> she self medicated with over-the-counter insulin.  Pt was on a regimen of: - Humulin NPH (SSI!) 10-20 units in am 10-30 units at before dinner (+/- 10 units at bedtime) She is getting this OTC. She was on R insulin >> fluctuations. She was on NovoLog pens. She was on Metformin before >> severe diarrhea >> was off work. She would not want to retry even an extended-release formulation.  We changed to: - Lantus 30 units at bedtime - Alogliptin 25 mg in am, before breakfast  Pt checks her sugars 2-3x a day and they are: - am: 60-200 >> 78-98 - 2h after b'fast: n/c - before lunch: n/c >> 90s - 2h after lunch: n/c - before dinner: 150-170 >> up to 250 - 2h after dinner: n/c - bedtime: 70-130, 200 >> up to 300 - nighttime: n/c + lows. Lowest sugar was 43 >> 78; she has hypoglycemia awareness at 60.  Highest sugar was 400 (Thanksgiving) >> 300.   Glucometer: ReliOn  Pt's meals are vegetarian; she was raw vegan at one point! - Breakfast: banana + oatmeal - Lunch: salad - Dinner: salad, pasta  - no CKD, last BUN/creatinine:  Lab Results  Component Value Date   BUN 13 06/25/2015   CREATININE 1.04* 06/25/2015  On Lisinopril. - last set of lipids: Lab Results  Component Value Date   CHOL 198 05/10/2015   HDL 53.70 05/10/2015   LDLCALC 128* 05/10/2015   TRIG 86.0 05/10/2015   CHOLHDL 4 05/10/2015  On  Lipitor. - last eye exam was in 05/2015. + Proliferative DR, per her report.  - she had numbness and tingling in her feet. She was on Neurontin, now off, as her PN pain resolved.  She also has a history of HL, HTN, kidney stones, depression, cataracts.  ROS: Constitutional: + weight gain and loss, + fatigue, + poor sleep, + nocturia Eyes: no blurry vision, no xerophthalmia ENT: +sore throat, no nodules palpated in throat, no dysphagia/odynophagia, no hoarseness, + tinnitus, + hypoacusis Cardiovascular: + CP/no SOB/palpitations/leg swelling Respiratory: no cough/+ SOB Gastrointestinal: no N/V/+ D/no C/heartburn Musculoskeletal: no muscle aches/joint aches Skin: no rashes, no hair loss Neurological: no tremors/numbness/tingling/dizziness, no HA  I reviewed pt's medications, allergies, PMH, social hx, family hx, and changes were documented in the history of present illness. Otherwise, unchanged from my initial visit note.  Past Medical History  Diagnosis Date  . CAD (coronary artery disease)     a. PCI [place] 2004. b. Angina - balloon angioplasty to LAD 04/21/12 (vessel too small for stent).  . Refusal of blood transfusions as patient is Jehovah's Witness   . HLD (hyperlipidemia)     a. takes Lipitor daily  . Asthma   . Migraine aura without headache     a. visual changes  . Chronic lower back pain   . Anxiety     a. with claustrophobia  . Carotid  stenosis     a. 11/2011 critical R, ~30% L by angiogram;  b. 04/2012 s/p R CEA.  Marland Kitchen Hypertension, accelerated   . Peripheral neuropathy (HCC)   . History of kidney stones   . Diabetes mellitus type 2, uncontrolled (HCC)   . Cataract     left  . Depression     a. occasionally takes st john's wart  . History of echocardiogram     Echo 12/16: mild LVH, EF 55-60%, no RWMA, Gr 2 DD, trivial AI, MAC, mild MR, mild LAE  . History of nuclear stress test     a. Myoview 12/16: EF 46%, no ischemia, Intermediate Risk (low EF)  . Myocardial  infarction [facility])     prior to 2013  . Embolic stroke (HCC)     a. 11/2011 MRI: mult small b/l infarcts c/w emboli;  b. 11/2011 MRA neck: high grade RICA stenosis;  c. 6/20013 TEE: EF 55-60% with mild LVH, severe MAC, < 1 cm calc mobile structure on the MV annulus, ? 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 Thought was RICA stenosis did not explain the b/l small strokes; concern for emboli from mobile MV struct;  d. 01/2012 Event Mon:  no AFib  . Stroke (HCC) 2015- dec    mini stroke  . Arthritis     "right hand; maybe just starting in my left hand" (04/21/2012)  . Uncontrolled type 2 diabetes mellitus with circulatory disorder, with long-term current use of insulin (HCC) 05/10/2015   Past Surgical History  Procedure Laterality Date  . Kidney stone surgery      several times  . Carpal tunnel release  ~ 2005    bilateral  . Cataract extraction w/ intraocular lens implant  2012    right  . Tee without cardioversion  12/14/2011    Procedure: TRANSESOPHAGEAL ECHOCARDIOGRAM (TEE);  Surgeon: Laurey Morale, MD;  Location: [facility] ENDOSCOPY;  Service: Cardiovascular;  Laterality: N/A;  . Ankle surgery  2005    after fall down stairs, left with screws and plate in place  . Holter monitor  2013    WNL  . US echocardiography  2002    normal LV fxn, mild LVH, borderline L atrial size  . Lithotripsy    . Coronary angioplasty with stent placement  2002    x2  . Cardiac catheterization  2004    WNL, no significant coronary disease  . Cardiac catheterization  12/19/11  . Coronary angioplasty  04/21/2012    95% stenosis mid LAD, vessel not large enough for stent, rec plavix/ASA for 1 mo  . Endarterectomy  05/09/2012    Procedure: ENDARTERECTOMY CAROTID;  Surgeon: Nada Libman, MD;  Location: [facility] OR;  Service: Vascular;  Laterality: Right;  Right carotd endarterectomy with bovine patch angioplasty  . Carotid endarterectomy    . Carotid angiogram Bilateral 12/19/2011    Procedure: CAROTID  ANGIOGRAM;  Surgeon: Nada Libman, MD;  Location: [facility] CATH LAB;  Service: Cardiovascular;  Laterality: Bilateral;  . Percutaneous coronary stent intervention (pci-s) N/A 04/21/2012    Procedure: PERCUTANEOUS CORONARY STENT INTERVENTION (PCI-S);  Surgeon: Kathleene Hazel, MD;  Location: [facility] CATH LAB;  Service: Cardiovascular;  Laterality: N/A;  . Pars plana vitrectomy Left 04/2015    [facility] Dr [facility]aney [facility])  . Eye surgery Left 04/2015    laser surgery,blood vessels rupturing  . Endarterectomy Left 06/23/2015    Procedure: ENDARTERECTOMY CAROTID;  Surgeon: Nada Libman, MD;  Location: [facility] OR;  Service: Vascular;  Laterality: Left;  . Patch angioplasty Left 06/23/2015    Procedure: PATCH ANGIOPLASTY USING 1cm x 4cm BOVINE PERICARDIAL PATCH;  Surgeon: Nada Libman, MD;  Location: [facility] OR;  Service: Vascular;  Laterality: Left;   Social History   Social History  . Marital Status: Married    Spouse Name: N/A  . Number of Children: 4   Occupational History  .    Social History Main Topics  . Smoking status: Former Smoker -- 1.00 packs/day for 3 years    Types: Cigarettes    Quit date: 06/26/1975  . Smokeless tobacco: Never Used  . Alcohol Use: No     Comment: none since 11/2011  . Drug Use: No  . Sexual Activity: Yes    Birth Control/ Protection: Post-menopausal   Social History Narrative   Caffeine: 2 cups/day coffee   Lives alone, husband in [place].  4 children nearby, son to live with her.   Occupation: unemployed, prior worked as IT consultant   Activity: stays active on farm   Diet: vegan, no processed foods, good water daily, fruits/vegetables daily   jehovah's witness - no blood products.   Current Outpatient Prescriptions on File Prior to Visit  Medication Sig Dispense Refill  . Alogliptin Benzoate 25 MG TABS Take 1 tablet daily in am - GENERIC 30 tablet 2  . Alogliptin Benzoate 25 MG TABS Take 25 mg by mouth daily. 30 tablet 0  . aspirin (ASPIRIN EC) 81 MG EC tablet Take  81 mg by mouth daily. Swallow whole.    Marland Kitchen atorvastatin (LIPITOR) 40 MG tablet Take 1 tablet (40 mg total) by mouth daily. 30 tablet 11  . bisoprolol (ZEBETA) 5 MG tablet Take 1 tablet (5 mg total) by mouth daily. 30 tablet 11  . clopidogrel (PLAVIX) 75 MG tablet Take 1 tablet (75 mg total) by mouth daily. 30 tablet 11  . Insulin Glargine (LANTUS SOLOSTAR) 100 UNIT/ML Solostar Pen Inject 30 Units into the skin daily at 10 pm. 5 pen 2  . Insulin Pen Needle (BD PEN NEEDLE NANO U/F) 32G X 4 MM MISC Use 1x a dau 100 each 11  . lisinopril (PRINIVIL,ZESTRIL) 20 MG tablet Take 1 tablet (20 mg total) by mouth daily. 90 tablet 3  . nitroGLYCERIN (NITROSTAT) 0.4 MG SL tablet Place 0.4 mg under the tongue every 5 (five) minutes as needed. For chest pain    . oxyCODONE-acetaminophen (PERCOCET/ROXICET) 5-325 MG tablet Take 1-2 tablets by mouth every 4 (four) hours as needed for moderate pain. (Patient not taking: Reported on 07/11/2015) 15 tablet 0  . Turmeric 500 MG CAPS Take 1 capsule by mouth daily.     No current facility-administered medications on file prior to visit.   Allergies  Allergen Reactions  . Other Shortness Of Breath    No blood or blood products  . Penicillins Itching and Other (See Comments)    From when younger  . Toradol [Ketorolac Tromethamine] Other (See Comments)    Panic attack   . Isosorbide Nitrate Other (See Comments)    headaches  . Metformin And Related Diarrhea and Nausea And Vomiting   Family History  Problem Relation Age of Onset  . Heart disease Mother     valve problems  . Stroke Mother   . Hypertension Mother   . Heart disease Sister     valve problems  . Heart disease Brother   . Diabetes Father   . Hypertension Father   . AAA (abdominal  aortic aneurysm) Father   . Cancer Neg Hx    PE: BP 132/80 mmHg  Pulse 79  Temp(Src) 99.3 F (37.4 C) (Oral)  Resp 12  Wt 197 lb 12.8 oz (89.721 kg)  SpO2 97% Wt Readings from Last 3 Encounters:  11/08/15 197 lb  12.8 oz (89.721 kg)  07/11/15 199 lb 3.2 oz (90.357 kg)  06/23/15 199 lb 15.3 oz (90.7 kg)   Constitutional: overweight, truncal distribution, in NAD Eyes: PERRLA, EOMI, no exophthalmos ENT: moist mucous membranes, no thyromegaly, no cervical lymphadenopathy Cardiovascular: RRR, No MRG Respiratory: CTA B Gastrointestinal: abdomen soft, NT, ND, BS+ Musculoskeletal: no deformities, strength intact in all 4 Skin: moist, warm, no rashes Neurological: no tremor with outstretched hands, DTR normal in all 4  ASSESSMENT: 1. DM2, insulin-dependent, uncontrolled, with complications - CAD, s/p PCI x2 2004 - followed by Dr. Shirlee Latch - Severe right carotid stenosis, s/p CEA - Cerebrovascular disease - MRI showing multiple small bilateral infarcts; also s/p embolic stroke - Proliferative DR - PN  PLAN:  1. Patient with long-standing, uncontrolled diabetes, with many diabetes complications, now on long acting insulin and Alogliptin, with poor control as she eats few meals a day. She is planning to start a raw vegan diet >> will continue Alogliptin and will also decrease Lantus as she is dropping CBGs from 200-300 at night to <100 in am. -  At next visit, we can consider Victoza or Trulicity, if needed. - I suggested to:  Patient Instructions  Please decrease: - Lantus to 24 units at bedtime  Please continue: - Alogliptin 25 mg in am, before breakfast  Try to read Dr. Katherina Right book: "Program for Reversing Diabetes" for the vegan concept and other ideas for healthy eating.  Please return in 3 months with your sugar log.   - continue checking sugars at different times of the day - check 2 times a day, rotating checks - advised for yearly eye exams - She is up-to-date - checked HbA1c today >> 8.7% (higher) - Return to clinic in 3 mo with sugar log   CC:  Dr Shirlee Latch

## 2015-11-17 ENCOUNTER — Encounter: Payer: Self-pay | Admitting: Family Medicine

## 2015-11-23 ENCOUNTER — Encounter: Payer: Self-pay | Admitting: Cardiology

## 2015-11-23 ENCOUNTER — Ambulatory Visit (INDEPENDENT_AMBULATORY_CARE_PROVIDER_SITE_OTHER): Payer: Managed Care, Other (non HMO) | Admitting: Cardiology

## 2015-11-23 VITALS — BP 174/100 | HR 72 | Ht 65.0 in | Wt 199.0 lb

## 2015-11-23 DIAGNOSIS — I2510 Atherosclerotic heart disease of native coronary artery without angina pectoris: Secondary | ICD-10-CM | POA: Diagnosis not present

## 2015-11-23 DIAGNOSIS — I10 Essential (primary) hypertension: Secondary | ICD-10-CM | POA: Diagnosis not present

## 2015-11-23 DIAGNOSIS — E785 Hyperlipidemia, unspecified: Secondary | ICD-10-CM

## 2015-11-23 LAB — BASIC METABOLIC PANEL
BUN: 17 mg/dL (ref 7–25)
CALCIUM: 10.4 mg/dL (ref 8.6–10.4)
CO2: 28 mmol/L (ref 20–31)
Chloride: 101 mmol/L (ref 98–110)
Creat: 1.05 mg/dL — ABNORMAL HIGH (ref 0.50–0.99)
GLUCOSE: 174 mg/dL — AB (ref 65–99)
Potassium: 4.6 mmol/L (ref 3.5–5.3)
SODIUM: 139 mmol/L (ref 135–146)

## 2015-11-23 LAB — LIPID PANEL
CHOL/HDL RATIO: 3.2 ratio (ref <=5.0)
Cholesterol: 208 mg/dL — ABNORMAL HIGH (ref 125–200)
HDL: 66 mg/dL (ref >=46)
LDL Cholesterol: 113 mg/dL (ref <130)
Triglycerides: 143 mg/dL (ref <150)
VLDL: 29 mg/dL (ref <30)

## 2015-11-23 MED ORDER — LISINOPRIL 5 MG PO TABS: 5.0000 mg | ORAL_TABLET | Freq: Every day | ORAL | Status: DC

## 2015-11-23 MED ORDER — BISOPROLOL FUMARATE 5 MG PO TABS: 5.0000 mg | ORAL_TABLET | Freq: Every day | ORAL | Status: DC

## 2015-11-23 NOTE — Patient Instructions (Signed)
Medication Instructions:  Take lisinopril 5mg  daily.  Take bisoprolol 5mg  daily.  Labwork: Lipid profile/BMET today  Testing/Procedures: none  Follow-Up: Your physician wants you to follow-up in: 6 months with Dr Shirlee Latch. (November 2017). You will receive a reminder letter in the mail two months in advance. If you don't receive a letter, please call our office to schedule the follow-up appointment.        If you need a refill on your cardiac medications before your next appointment, please call your pharmacy.

## 2015-11-24 NOTE — Progress Notes (Signed)
Patient ID: [name], female   DOB: [birth year], 60 y.o.   MRN: [id number] PCP: Dr. Sharen Hones  60 yo with history of CAD s/p PCI in [place] in 2004, diabetes, and CVA now s/p bilateral CEAs presents for cardiology followup.  Patient was admitted in 6/13 with confusion and memory difficulty.  MRI showed multiple small bilateral infarcts.  There was severe right carotid stenosis, but this does not explain the bilateral infarcts.  TEE showed a mobile calcified structure attached to the mitral valve.  There was heavy mitral annular calcification.  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  It was thought that this structure could be a potential source of embolism and Plavix was started.  3 week event monitor showed no atrial fibrillation. LHC in 10/13 showed 95% mid LAD stenosis.  The vessel was small caliber so she only had PTCA.  She went on to have right CEA in 11/13.  She had TIA in 12/15 and was found to have progressive LICA stenosis.  Eventually, in 12/16, she had left CEA.  Last Cardiolite in 12/16 showed no ischemia or infarction and echo in 12/16 showed normal EF.   No exertional dyspnea or chest pain.  No claudication.  She takes bisoprolol daily but rarely takes lisinopril because her BP generally is not elevated at home and she has orthostatic symptoms when she takes lisinopril. BP 174/100 today but runs 120s/70s when she checks at home (fairly frequently).   Labs (6/13): lupus anticoagulant negative, anticardiolipin antibody negative, LDL 135, creatinine 0.77, blood cultures NGTD Labs (11/13): K 3.9, creatinine 0.76, LDL 87, HDL 49 Labs (11/16): LDL 128, HDL 54 Labs (12/16): K 4.3, creatinine 1.04  ECG: NSR, normal  PMH: 1. Diabetes mellitus  2. Hyperlipidemia 3. HTN 4. CVA: 6/13 admission with confusion and memory problems.  MRI showed multiple small bilateral infarcts c/w emboli.  MRA neck showed high grade RICA stenosis.  TEE showed EF 55-60% with  mild LVH, severe MAC with a < 1 cm calcified mobile structure on the MV annulus, ? 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  It was thought that the RICA stenosis could not explain the bilateral small strokes and concern was for emboli from the mobile calcified mitral annular structure.  3-week event monitor (8/13) showed no atrial fibrillation. Right CEA in 11/13. Left CEA in 12/16.  5. Carotid stenosis: Now s/p bilateral CEAs.    6. CAD s/p PCI x 2 in 2004 in [place].  Lexiscan myoview (7/13) showed small basal inferior scar with no ischemia, EF 53%, inferobasal hypokinesis.  LHC (10/13) with 95% mLAD stenosis.  The vessel was small caliber, she had PTCA only.   - Cardiolite (12/16) with EF 46%, no ischemia or infarction.  - Echo (12/16) with EF 55-60%, grade II diastolic dysfunction, MAC, mild MR 7. Asthma  SH: [place]  Moved here from [place] for husband's work.  Now unemployed.  Nonsmoker.  Jehovah's Witness.   FH: Mother with valvular heart disease.  Brother and sister with heart "trouble," she is not sure exactly what.   ROS: All systems reviewed and negative except as per HPI.   Current Outpatient Prescriptions  Medication Sig Dispense Refill  . Alogliptin Benzoate 25 MG TABS Take 25 mg by mouth daily. 30 tablet 0  . aspirin (ASPIRIN EC) 81 MG EC tablet Take 81 mg by mouth daily. Swallow whole.    Marland Kitchen  atorvastatin (LIPITOR) 40 MG tablet Take 1 tablet (40 mg total) by mouth daily. 30 tablet 11  . bisoprolol (ZEBETA) 5 MG tablet Take 1 tablet (5 mg total) by mouth daily. 90 tablet 3  . Insulin Glargine (LANTUS SOLOSTAR) 100 UNIT/ML Solostar Pen Inject 24 Units into the skin daily at 10 pm. 5 pen 2  . Insulin Pen Needle (BD PEN NEEDLE NANO U/F) 32G X 4 MM MISC Use 1x a dau 100 each 11  . nitroGLYCERIN (NITROSTAT) 0.4 MG SL tablet Place 0.4 mg under the tongue every 5 (five) minutes as needed. For chest pain    . Turmeric 500 MG CAPS Take 1 capsule  by mouth daily.    Marland Kitchen lisinopril (PRINIVIL,ZESTRIL) 5 MG tablet Take 1 tablet (5 mg total) by mouth daily. 90 tablet 3   No current facility-administered medications for this visit.    BP 174/100 mmHg  Pulse 72  Ht 5\' 5"  (1.651 m)  Wt 199 lb (90.266 kg)  BMI 33.12 kg/m2'. General: NAD Neck: No JVD, no thyromegaly or thyroid nodule.  Lungs: Clear to auscultation bilaterally with normal respiratory effort. CV: Nondisplaced PMI.  Heart regular S1/S2, no S3/S4, no murmur.  No peripheral edema.  No carotid bruit.  Normal pedal pulses.  Abdomen: Soft, nontender, no hepatosplenomegaly, no distention.   Neurologic: Alert and oriented x 3.  Psych: Normal affect. Extremities: No clubbing or cyanosis.   Assessment/Plan  1. CAD  Status post PTCA LAD in 10/13 with no exertional chest pain.  Normal Cardiolite in 12/16. Continue ASA 81, bisoprolol, statin.  2. Carotid stenosis  History of CVA.  Now s/p bilateral CEAs.   3. Hypertension BP very high today but says she gets orthostatic when she regularly takes lisinopril 20 mg daily.  She is on bisoprolol.  At home, SBP runs in 120s when she checks.  I will have her decrease lisinopril to 5 mg daily and she will take it regularly.  BMET today.   4.  Hyperlipidemia LDL high in 11/16 but was not taking statin.  She is taking it now.  Check lipids today.    [name] 11/24/2015 12:00 AM

## 2015-11-28 ENCOUNTER — Telehealth: Payer: Self-pay | Admitting: *Deleted

## 2015-11-28 DIAGNOSIS — I2510 Atherosclerotic heart disease of native coronary artery without angina pectoris: Secondary | ICD-10-CM

## 2015-11-28 DIAGNOSIS — E785 Hyperlipidemia, unspecified: Secondary | ICD-10-CM

## 2015-11-28 MED ORDER — ATORVASTATIN CALCIUM 80 MG PO TABS: 80.0000 mg | ORAL_TABLET | Freq: Every day | ORAL | Status: DC

## 2015-11-28 NOTE — Telephone Encounter (Signed)
Notes Recorded by Laurey Morale, MD on 11/25/2015 at 3:06 PM LDL too high, increase atorvastatin to 80 mg daily with lipids/LFTs in 2 months.

## 2016-01-31 ENCOUNTER — Other Ambulatory Visit: Payer: Managed Care, Other (non HMO)

## 2016-02-08 ENCOUNTER — Ambulatory Visit: Payer: Managed Care, Other (non HMO) | Admitting: Internal Medicine

## 2016-04-04 ENCOUNTER — Ambulatory Visit: Payer: Managed Care, Other (non HMO) | Admitting: Internal Medicine

## 2016-04-09 ENCOUNTER — Ambulatory Visit: Payer: Managed Care, Other (non HMO) | Admitting: Family

## 2016-04-09 ENCOUNTER — Encounter (HOSPITAL_COMMUNITY): Payer: Managed Care, Other (non HMO)

## 2016-05-01 ENCOUNTER — Encounter: Payer: Self-pay | Admitting: Family

## 2016-05-02 ENCOUNTER — Encounter: Payer: Self-pay | Admitting: Family

## 2016-05-02 ENCOUNTER — Encounter (HOSPITAL_COMMUNITY): Payer: Managed Care, Other (non HMO)

## 2016-05-02 ENCOUNTER — Ambulatory Visit: Payer: Managed Care, Other (non HMO) | Admitting: Family

## 2016-05-03 ENCOUNTER — Ambulatory Visit (INDEPENDENT_AMBULATORY_CARE_PROVIDER_SITE_OTHER): Payer: Managed Care, Other (non HMO) | Admitting: Family

## 2016-05-03 ENCOUNTER — Encounter: Payer: Self-pay | Admitting: Family

## 2016-05-03 ENCOUNTER — Ambulatory Visit (HOSPITAL_COMMUNITY)
Admission: RE | Admit: 2016-05-03 | Discharge: 2016-05-03 | Disposition: A | Discharge status: Home / Self Care | Payer: Managed Care, Other (non HMO) | Source: Ambulatory Visit | Attending: Family | Admitting: Family

## 2016-05-03 VITALS — BP 181/93 | HR 64 | Temp 97.2°F | Ht 66.0 in | Wt 207.0 lb

## 2016-05-03 DIAGNOSIS — I6523 Occlusion and stenosis of bilateral carotid arteries: Secondary | ICD-10-CM | POA: Diagnosis not present

## 2016-05-03 DIAGNOSIS — Z9889 Other specified postprocedural states: Secondary | ICD-10-CM

## 2016-05-03 DIAGNOSIS — I6522 Occlusion and stenosis of left carotid artery: Secondary | ICD-10-CM | POA: Diagnosis not present

## 2016-05-03 LAB — VAS US CAROTID
LCCADDIAS: 19 cm/s
LCCADSYS: 99 cm/s
LEFT ECA DIAS: -16 cm/s
LICADDIAS: -33 cm/s
LICAPDIAS: 34 cm/s
LICAPSYS: 120 cm/s
Left CCA prox dias: 21 cm/s
Left CCA prox sys: 110 cm/s
Left ICA dist sys: -102 cm/s
RCCAPSYS: 92 cm/s
RIGHT CCA MID DIAS: -15 cm/s
RIGHT ECA DIAS: -20 cm/s
Right CCA prox dias: 10 cm/s
Right cca dist sys: -80 cm/s

## 2016-05-03 NOTE — Patient Instructions (Signed)
Stroke Prevention Some medical conditions and behaviors are associated with an increased chance of having a stroke. You may prevent a stroke by making healthy choices and managing medical conditions. HOW CAN I REDUCE MY RISK OF HAVING A STROKE?   Stay physically active. Get at least 30 minutes of activity on most or all days.  Do not smoke. It may also be helpful to avoid exposure to secondhand smoke.  Limit alcohol use. Moderate alcohol use is considered to be:  No more than 2 drinks per day for men.  No more than 1 drink per day for nonpregnant women.  Eat healthy foods. This involves:  Eating 5 or more servings of fruits and vegetables a day.  Making dietary changes that address high blood pressure (hypertension), high cholesterol, diabetes, or obesity.  Manage your cholesterol levels.  Making food choices that are high in fiber and low in saturated fat, trans fat, and cholesterol may control cholesterol levels.  Take any prescribed medicines to control cholesterol as directed by your health care provider.  Manage your diabetes.  Controlling your carbohydrate and sugar intake is recommended to manage diabetes.  Take any prescribed medicines to control diabetes as directed by your health care provider.  Control your hypertension.  Making food choices that are low in salt (sodium), saturated fat, trans fat, and cholesterol is recommended to manage hypertension.  Ask your health care provider if you need treatment to lower your blood pressure. Take any prescribed medicines to control hypertension as directed by your health care provider.  If you are 18-39 years of age, have your blood pressure checked every 3-5 years. If you are 40 years of age or older, have your blood pressure checked every year.  Maintain a healthy weight.  Reducing calorie intake and making food choices that are low in sodium, saturated fat, trans fat, and cholesterol are recommended to manage  weight.  Stop drug abuse.  Avoid taking birth control pills.  Talk to your health care provider about the risks of taking birth control pills if you are over 35 years old, smoke, get migraines, or have ever had a blood clot.  Get evaluated for sleep disorders (sleep apnea).  Talk to your health care provider about getting a sleep evaluation if you snore a lot or have excessive sleepiness.  Take medicines only as directed by your health care provider.  For some people, aspirin or blood thinners (anticoagulants) are helpful in reducing the risk of forming abnormal blood clots that can lead to stroke. If you have the irregular heart rhythm of atrial fibrillation, you should be on a blood thinner unless there is a good reason you cannot take them.  Understand all your medicine instructions.  Make sure that other conditions (such as anemia or atherosclerosis) are addressed. SEEK IMMEDIATE MEDICAL CARE IF:   You have sudden weakness or numbness of the face, arm, or leg, especially on one side of the body.  Your face or eyelid droops to one side.  You have sudden confusion.  You have trouble speaking (aphasia) or understanding.  You have sudden trouble seeing in one or both eyes.  You have sudden trouble walking.  You have dizziness.  You have a loss of balance or coordination.  You have a sudden, severe headache with no known cause.  You have new chest pain or an irregular heartbeat. Any of these symptoms may represent a serious problem that is an emergency. Do not wait to see if the symptoms will   go away. Get medical help at once. Call your local emergency services (911 in [place] Do not drive yourself to the hospital.   This information is not intended to replace advice given to you by your health care provider. Make sure you discuss any questions you have with your health care provider.   Document Released: 07/19/2004 Document Revised: 07/02/2014 Document Reviewed:  12/12/2012 Elsevier Interactive Patient Education 2016 [employer]  

## 2016-05-03 NOTE — Progress Notes (Signed)
Chief Complaint: Follow up Extracranial Carotid Artery Stenosis   History of Present Illness  [name] is a 60 y.o. female patient of Dr. Trula Slade who is status post left carotid endarterectomy on 06/23/2015 for asymptomatic stenosis.  Intraoperative findings included an 80% stenosis.  She has been doing well without complaints.  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   Dr. Trula Slade initially met pt in 2013 when she presented to the hospital with confusion and short-term memory loss. She was amnestic to these events. She had an MRI which showed multiple small areas of acute infarct, bilaterally. She had a TEE which suggested a possible cardiac source. In addition, I performed carotid angiography which revealed approximately 50% left carotid stenosis and 80% right carotid stenosis. Ultimately, the patient underwent a right carotid endarterectomy on 05/09/2012. Intraoperative findings were an 85% lesion. The patient did not return for follow-up.  In December 2015, she was admitted to [facility] with a left facial droop. Reportedly, she was diagnosed with a stroke. She was seen by vascular surgery but elected not to have a left carotid intervention. She comes in today for follow-up. She has not had any symptoms consistent with left carotid stenosis.  Patient has a significant cardiac history. She had a 95% mid LAD stenosis treated with angioplasty in 2013 which preceded her carotid endarterectomy. She had an event monitor which did not demonstrate any atrial fibrillation.  The patient has recently undergone laser eye surgery for a vitreous hemorrhage. She is now back on aspirin. The patient has a history of diabetes which is poorly controlled. She is a former smoker having quit in 1977. She suffers from hypercholesterolemia which is managed with a statin. Her blood pressure is  managed with an ACE inhibitor.  Dr. Trula Slade last saw pt in January 2017. At that time she was doing well and was to follow up in 9 months.   Pt Diabetic: yes, 8.7 A1C in May 2017 (review of records). She sees Dr. Letta Median  Pt smoker: former smoker, quit in 1978, started at age 18 years  Pt meds include: Statin : yes ASA: yes Other anticoagulants/antiplatelets: no   Past Medical History:  Diagnosis Date  . Anxiety    a. with claustrophobia  . Arthritis    "right hand; maybe just starting in my left hand" (04/21/2012)  . Asthma   . CAD (coronary artery disease)    a. PCI [place] 2004. b. Angina - balloon angioplasty to LAD 04/21/12 (vessel too small for stent).  . Carotid stenosis    a. 11/2011 critical R, ~30% L by angiogram;  b. 04/2012 s/p R CEA.  . Cataract    left  . Chronic lower back pain   . Depression    a. occasionally takes st john's wart  . Diabetes mellitus type 2, uncontrolled ([name])   . Embolic stroke ([place])    a. 11/2011 MRI: mult small b/l infarcts c/w emboli;  b. 11/2011 MRA neck: high grade RICA stenosis;  c. 6/20013 TEE: EF 55-60% with mild LVH, severe MAC, < 1 cm calc mobile structure on the MV annulus, ? 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 Thought was RICA stenosis did not explain the b/l small strokes; concern for emboli from mobile MV struct;  d. 01/2012 Event Mon:  no AFib  . History of echocardiogram    Echo 12/16: mild LVH, EF 55-60%, no  RWMA, Gr 2 DD, trivial AI, MAC, mild MR, mild LAE  . History of kidney stones   . History of nuclear stress test    a. Myoview 12/16: EF 46%, no ischemia, Intermediate Risk (low EF)  . HLD (hyperlipidemia)    a. takes Lipitor daily  . Hypertension, accelerated   . 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 visual changes  . Myocardial infarction    prior to 2013  . Peripheral neuropathy ([place])   . Refusal of blood transfusions as patient is Jehovah's Witness   . Stroke ([place]) 2015- dec   mini stroke  .  Uncontrolled type 2 diabetes mellitus with circulatory disorder, with long-term current use of insulin ([place]) 05/10/2015    Social History Social History  Substance Use Topics  . Smoking status: Former Smoker    Packs/day: 0.00    Years: 3.00    Types: Cigarettes    Quit date: 06/26/1975  . Smokeless tobacco: Never Used  . Alcohol use No     Comment: none since 11/2011    Family History Family History  Problem Relation Age of Onset  . Heart disease Mother     valve problems  . Stroke Mother   . Hypertension Mother   . Heart disease Sister     valve problems  . Heart disease Brother   . Diabetes Father   . Hypertension Father   . AAA (abdominal aortic aneurysm) Father   . Cancer Neg Hx     Surgical History Past Surgical History:  Procedure Laterality Date  . ANKLE SURGERY  2005   after fall down stairs, left with screws and plate in place  . CARDIAC CATHETERIZATION  2004   WNL, no significant coronary disease  . CARDIAC CATHETERIZATION  12/19/11  . CAROTID ANGIOGRAM Bilateral 12/19/2011   Procedure: CAROTID ANGIOGRAM;  Surgeon: Serafina Mitchell, MD;  Location: [facility] CATH LAB;  Service: Cardiovascular;  Laterality: Bilateral;  . CAROTID ENDARTERECTOMY    . CARPAL TUNNEL RELEASE  ~ 2005   bilateral  . CATARACT EXTRACTION W/ INTRAOCULAR LENS IMPLANT  2012   right  . CATARACT EXTRACTION W/PHACO Left 10/2015   Dr Patrice Paradise at [facility]  . CORONARY ANGIOPLASTY  04/21/2012   95% stenosis mid LAD, vessel not large enough for stent, rec plavix/ASA for 1 mo  . CORONARY ANGIOPLASTY WITH STENT PLACEMENT  2002   x2  . ENDARTERECTOMY  05/09/2012   Procedure: ENDARTERECTOMY CAROTID;  Surgeon: Serafina Mitchell, MD;  Location: [facility] OR;  Service: Vascular;  Laterality: Right;  Right carotd endarterectomy with bovine patch angioplasty  . ENDARTERECTOMY Left 06/23/2015   Procedure: ENDARTERECTOMY CAROTID;  Surgeon: Serafina Mitchell, MD;  Location: [place];  Service: Vascular;  Laterality: Left;  . EYE  SURGERY Left 04/2015   laser surgery,blood vessels rupturing  . holter monitor  2013   WNL  . KIDNEY STONE SURGERY     several times  . LITHOTRIPSY    . PARS PLANA VITRECTOMY Left 04/2015   [facility] Dr Danella Sensing)  . PATCH ANGIOPLASTY Left 06/23/2015   Procedure: PATCH ANGIOPLASTY USING 1cm x 4cm BOVINE PERICARDIAL PATCH;  Surgeon: Serafina Mitchell, MD;  Location: [place];  Service: Vascular;  Laterality: Left;  . PERCUTANEOUS CORONARY STENT INTERVENTION (PCI-S) N/A 04/21/2012   Procedure: PERCUTANEOUS CORONARY STENT INTERVENTION (PCI-S);  Surgeon: Burnell Blanks, MD;  Location: [facility] CATH LAB;  Service: Cardiovascular;  Laterality: N/A;  . TEE WITHOUT CARDIOVERSION  12/14/2011   Procedure:  TRANSESOPHAGEAL ECHOCARDIOGRAM (TEE);  Surgeon: Larey Dresser, MD;  Location: [facility] ENDOSCOPY;  Service: Cardiovascular;  Laterality: N/A;  . US ECHOCARDIOGRAPHY  2002   normal LV fxn, mild LVH, borderline L atrial size    Allergies  Allergen Reactions  . Other Shortness Of Breath    No blood or blood products  . Penicillins Itching and Other (See Comments)    From when younger  . Toradol [Ketorolac Tromethamine] Other (See Comments)    Panic attack   . Isosorbide Nitrate Other (See Comments)    headaches  . Metformin And Related Diarrhea and Nausea And Vomiting    Current Outpatient Prescriptions  Medication Sig Dispense Refill  . Alogliptin Benzoate 25 MG TABS Take 25 mg by mouth daily. 30 tablet 0  . aspirin (ASPIRIN EC) 81 MG EC tablet Take 81 mg by mouth daily. Swallow whole.    Marland Kitchen atorvastatin (LIPITOR) 80 MG tablet Take 1 tablet (80 mg total) by mouth daily. 90 tablet 0  . bisoprolol (ZEBETA) 5 MG tablet Take 1 tablet (5 mg total) by mouth daily. 90 tablet 3  . Insulin Glargine (LANTUS SOLOSTAR) 100 UNIT/ML Solostar Pen Inject 24 Units into the skin daily at 10 pm. 5 pen 2  . Insulin Pen Needle (BD PEN NEEDLE NANO U/F) 32G X 4 MM MISC Use 1x a dau 100 each 11  . lisinopril  (PRINIVIL,ZESTRIL) 5 MG tablet Take 1 tablet (5 mg total) by mouth daily. 90 tablet 3  . nitroGLYCERIN (NITROSTAT) 0.4 MG SL tablet Place 0.4 mg under the tongue every 5 (five) minutes as needed. For chest pain    . Turmeric 500 MG CAPS Take 1 capsule by mouth daily.     No current facility-administered medications for this visit.     Review of Systems : See HPI for pertinent positives and negatives.  Physical Examination  Vitals:   05/03/16 0925 05/03/16 0928  BP: (!) 184/97 (!) 181/93  Pulse: 67 64  Temp: 97.2 F (36.2 C)   SpO2: 97%   Weight: 207 lb (93.9 kg)   Height: '5\' 6"'  (1.676 m)    Body mass index is 33.41 kg/m.  General: WDWN obese female in NAD GAIT: normal Eyes: PERRLA Pulmonary:  Respirations are non-labored, good air movement, CTAB, nor rales, rhonchi, or wheezing.  Cardiac: regular rhythm, no detected murmur.  VASCULAR EXAM Carotid Bruits Right Left   Negative Negative    Aorta is not palpable. Radial pulses are 2+ palpable and equal.                                                                                                                            LE Pulses Right Left       POPLITEAL  not palpable   not palpable       POSTERIOR TIBIAL  1+ palpable   not palpable        DORSALIS PEDIS      ANTERIOR  TIBIAL 1+ palpable  faintly palpable     Gastrointestinal: soft, nontender, BS WNL, no r/g,  negative palpable masses.  Musculoskeletal: No muscle atrophy/wasting. M/S 5/5 throughout, extremities without ischemic changes.  Neurologic: A&O X 3; Appropriate Affect, Speech is normal CN 2-12 intact, pain and light touch intact in extremities, Motor exam as listed above.     Assessment: [name] is a 60 y.o. female who is status post left carotid endarterectomy on 06/23/2015 for asymptomatic stenosis, and right carotid endarterectomy on 05/09/2012. Her last stroke was in December 2015, no subsequent stroke or TIA.  Her  atherosclerotic risk factors include uncontrolled DM, former smoker, CAD, dyslipidemia, and obesity.   DATA Today's carotid duplex suggests no restenosis at the bilateral CEA sites.  No significant stenosis of the bilateral ECA or CCA.  Bilateral vertebral artery flow is antegrade. Bilateral subclavian artery waveforms are normal. Significant improvement in the left ICA, stable in the right ICA compared to 05/30/15 exam.   Plan: Follow-up in 1 year with Carotid Duplex scan.   I discussed in depth with the patient the nature of atherosclerosis, and emphasized the importance of maximal medical management including strict control of blood pressure, blood glucose, and lipid levels, obtaining regular exercise, and continued cessation of smoking.  The patient is aware that without maximal medical management the underlying atherosclerotic disease process will progress, limiting the benefit of any interventions. The patient was given information about stroke prevention and what symptoms should prompt the patient to seek immediate medical care. Thank you for allowing [place] to participate in this patient's care.  Clemon Chambers, RN, MSN, FNP-C [facility] [place] Office: [phone]  Clinic Physician: Oneida Alar  05/03/16 9:35 AM

## 2016-05-08 NOTE — Addendum Note (Signed)
Addended by: Sharee Pimple on: 05/08/2016 04:49 PM   Modules accepted: Orders

## 2016-05-20 ENCOUNTER — Other Ambulatory Visit: Payer: Self-pay | Admitting: Internal Medicine

## 2016-05-22 ENCOUNTER — Other Ambulatory Visit: Payer: Self-pay | Admitting: Internal Medicine

## 2016-10-17 ENCOUNTER — Encounter: Payer: Self-pay | Admitting: Cardiology

## 2017-05-07 ENCOUNTER — Ambulatory Visit: Payer: Managed Care, Other (non HMO) | Admitting: Family

## 2017-05-07 ENCOUNTER — Encounter (HOSPITAL_COMMUNITY): Payer: Managed Care, Other (non HMO)

## 2017-05-14 DIAGNOSIS — M1990 Unspecified osteoarthritis, unspecified site: Secondary | ICD-10-CM | POA: Insufficient documentation

## 2017-05-14 DIAGNOSIS — Z9289 Personal history of other medical treatment: Secondary | ICD-10-CM | POA: Insufficient documentation

## 2017-05-14 DIAGNOSIS — F32A Depression, unspecified: Secondary | ICD-10-CM | POA: Insufficient documentation

## 2017-05-14 DIAGNOSIS — Z531 Procedure and treatment not carried out because of patient's decision for reasons of belief and group pressure: Secondary | ICD-10-CM | POA: Insufficient documentation

## 2017-05-14 DIAGNOSIS — E1165 Type 2 diabetes mellitus with hyperglycemia: Secondary | ICD-10-CM | POA: Insufficient documentation

## 2017-05-14 DIAGNOSIS — F419 Anxiety disorder, unspecified: Secondary | ICD-10-CM | POA: Insufficient documentation

## 2017-05-14 DIAGNOSIS — I219 Acute myocardial infarction, unspecified: Secondary | ICD-10-CM | POA: Insufficient documentation

## 2017-05-14 DIAGNOSIS — J45909 Unspecified asthma, uncomplicated: Secondary | ICD-10-CM | POA: Insufficient documentation

## 2017-05-14 DIAGNOSIS — M545 Low back pain: Secondary | ICD-10-CM

## 2017-05-14 DIAGNOSIS — G43109 Migraine with aura, not intractable, without status migrainosus: Secondary | ICD-10-CM | POA: Insufficient documentation

## 2017-05-14 DIAGNOSIS — IMO0002 Reserved for concepts with insufficient information to code with codable children: Secondary | ICD-10-CM | POA: Insufficient documentation

## 2017-05-14 DIAGNOSIS — F329 Major depressive disorder, single episode, unspecified: Secondary | ICD-10-CM | POA: Insufficient documentation

## 2017-05-14 DIAGNOSIS — G629 Polyneuropathy, unspecified: Secondary | ICD-10-CM | POA: Insufficient documentation

## 2017-05-14 DIAGNOSIS — I639 Cerebral infarction, unspecified: Secondary | ICD-10-CM | POA: Insufficient documentation

## 2017-05-14 DIAGNOSIS — H269 Unspecified cataract: Secondary | ICD-10-CM | POA: Insufficient documentation

## 2017-05-14 DIAGNOSIS — Z87442 Personal history of urinary calculi: Secondary | ICD-10-CM | POA: Insufficient documentation

## 2017-05-14 DIAGNOSIS — G8929 Other chronic pain: Secondary | ICD-10-CM | POA: Insufficient documentation

## 2017-05-14 DIAGNOSIS — E785 Hyperlipidemia, unspecified: Secondary | ICD-10-CM | POA: Insufficient documentation

## 2017-05-21 ENCOUNTER — Encounter (HOSPITAL_COMMUNITY): Payer: Managed Care, Other (non HMO)

## 2017-05-21 ENCOUNTER — Ambulatory Visit: Payer: Managed Care, Other (non HMO) | Admitting: Family

## 2017-05-27 ENCOUNTER — Ambulatory Visit: Payer: Managed Care, Other (non HMO) | Admitting: Internal Medicine

## 2017-07-04 ENCOUNTER — Ambulatory Visit: Payer: Managed Care, Other (non HMO) | Admitting: Internal Medicine

## 2017-07-30 ENCOUNTER — Ambulatory Visit (HOSPITAL_COMMUNITY)
Admission: RE | Admit: 2017-07-30 | Discharge: 2017-07-30 | Disposition: A | Discharge status: Home / Self Care | Payer: Managed Care, Other (non HMO) | Source: Ambulatory Visit | Attending: Family | Admitting: Family

## 2017-07-30 ENCOUNTER — Ambulatory Visit: Payer: Managed Care, Other (non HMO) | Admitting: Family

## 2017-07-30 ENCOUNTER — Encounter: Payer: Self-pay | Admitting: Family

## 2017-07-30 VITALS — BP 178/85 | HR 55 | Temp 98.2°F | Resp 16 | Ht 66.0 in | Wt 193.0 lb

## 2017-07-30 DIAGNOSIS — Z9889 Other specified postprocedural states: Secondary | ICD-10-CM | POA: Diagnosis not present

## 2017-07-30 DIAGNOSIS — I6523 Occlusion and stenosis of bilateral carotid arteries: Secondary | ICD-10-CM

## 2017-07-30 DIAGNOSIS — E785 Hyperlipidemia, unspecified: Secondary | ICD-10-CM | POA: Insufficient documentation

## 2017-07-30 DIAGNOSIS — Z794 Long term (current) use of insulin: Secondary | ICD-10-CM | POA: Diagnosis not present

## 2017-07-30 DIAGNOSIS — I2510 Atherosclerotic heart disease of native coronary artery without angina pectoris: Secondary | ICD-10-CM | POA: Diagnosis not present

## 2017-07-30 DIAGNOSIS — E119 Type 2 diabetes mellitus without complications: Secondary | ICD-10-CM | POA: Diagnosis not present

## 2017-07-30 DIAGNOSIS — I10 Essential (primary) hypertension: Secondary | ICD-10-CM | POA: Insufficient documentation

## 2017-07-30 DIAGNOSIS — Z48812 Encounter for surgical aftercare following surgery on the circulatory system: Secondary | ICD-10-CM | POA: Insufficient documentation

## 2017-07-30 NOTE — Patient Instructions (Signed)

## 2017-07-30 NOTE — Progress Notes (Signed)
Chief Complaint: Follow up Extracranial Carotid Artery Stenosis   History of Present Illness  [name] is a 62 y.o. female patient of Dr. Trula Slade who is status post left carotid endarterectomy on 06/23/2015 for asymptomatic stenosis. Intraoperative findings included an 80% stenosis. She is also s/p right carotid endarterectomy on 05/09/2012.   Dr. Trula Slade initially met pt in Aug 29, 2011 when she presented to the hospital with confusion and short-term memory loss. She was amnestic to these events. She had an MRI which showed multiple small areas of acute infarct, bilaterally. She had a TEE which suggested a possible cardiac source. In addition, Dr. Trula Slade performed carotid angiography which revealed approximately 50% left carotid stenosis and 80% right carotid stenosis. Ultimately, the patient underwent a right carotid endarterectomy on 05/09/2012. Intraoperative findings were an 85% lesion. The patient did not return for follow-up.  In December 2015, she was admitted to [facility] with a left facial droop. Reportedly, she was diagnosed with a stroke. She was seen by vascular surgery but elected not to have a left carotid intervention. She comes in today for follow-up. She has not had any symptoms consistent with left carotid stenosis.  Dr. Trula Slade last saw pt in January 2017. At that time she was doing well and was to follow up in 9 months.   Patient has a significant cardiac history. She had a 95% mid LAD stenosis treated with angioplasty in 29-Aug-2011 which preceded her carotid endarterectomy. She had an event monitor which did not demonstrate any atrial fibrillation.  The patient has undergone laser eye surgery for a vitreous hemorrhage. She is back on aspirin. The patient has a history of diabetes which is poorly controlled.  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  Her mother  died in 08/28/2016.  Pt Diabetic: yes, 8.7 A1C in May 2017 (review of records). She sees Dr. Letta Median  Pt smoker: former smoker, quit in 08-28-1976, started at age 71 years  Pt meds include: Statin : no, she ran out, did not call her cardiologist office to request refill  ASA: yes Other anticoagulants/antiplatelets: no    Past Medical History:  Diagnosis Date  . Anxiety    a. with claustrophobia  . Arthritis    "right hand; maybe just starting in my left hand" (04/21/2012)  . Asthma   . CAD (coronary artery disease)    a. PCI [place] 28-Aug-2002. b. Angina - balloon angioplasty to LAD 04/21/12 (vessel too small for stent).  . Carotid stenosis    a. 11/2011 critical R, ~30% L by angiogram;  b. 04/2012 s/p R CEA.  . Cataract    left  . Chronic lower back pain   . Depression    a. occasionally takes st john's wart  . Diabetes mellitus type 2, uncontrolled ([place])   . Embolic stroke ([place])    a. 11/2011 MRI: mult small b/l infarcts c/w emboli;  b. 11/2011 MRA neck: high grade RICA stenosis;  c. 6/20013 TEE: EF 55-60% with mild LVH, severe MAC, < 1 cm calc mobile structure on the MV annulus, ? 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 Thought was RICA stenosis did not explain the b/l small strokes; concern for emboli from mobile MV struct;  d. 01/2012 Event Mon:  no AFib  . History of echocardiogram    Echo 12/16: mild LVH, EF 55-60%, no RWMA, Gr 2 DD, trivial AI,  MAC, mild MR, mild LAE  . History of kidney stones   . History of nuclear stress test    a. Myoview 12/16: EF 46%, no ischemia, Intermediate Risk (low EF)  . HLD (hyperlipidemia)    a. takes Lipitor daily  . Hypertension, accelerated   . 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 visual changes  . Myocardial infarction [facility])    prior to 2013  . Peripheral neuropathy   . Refusal of blood transfusions as patient is Jehovah's Witness   . Stroke ([place]) 2015- dec   mini stroke  . Uncontrolled type 2 diabetes mellitus with circulatory  disorder, with long-term current use of insulin ([place]) 05/10/2015    Social History Social History   Tobacco Use  . Smoking status: Former Smoker    Packs/day: 0.00    Years: 3.00    Pack years: 0.00    Types: Cigarettes    Last attempt to quit: 06/26/1975    Years since quitting: 42.1  . Smokeless tobacco: Never Used  Substance Use Topics  . Alcohol use: No    Comment: none since 11/2011  . Drug use: No    Family History Family History  Problem Relation Age of Onset  . Heart disease Mother        valve problems  . Stroke Mother   . Hypertension Mother   . Heart disease Sister        valve problems  . Heart disease Brother   . Diabetes Father   . Hypertension Father   . AAA (abdominal aortic aneurysm) Father   . Cancer Neg Hx     Surgical History Past Surgical History:  Procedure Laterality Date  . ANKLE SURGERY  2005   after fall down stairs, left with screws and plate in place  . CARDIAC CATHETERIZATION  2004   WNL, no significant coronary disease  . CARDIAC CATHETERIZATION  12/19/11  . CAROTID ANGIOGRAM Bilateral 12/19/2011   Procedure: CAROTID ANGIOGRAM;  Surgeon: Serafina Mitchell, MD;  Location: [facility] CATH LAB;  Service: Cardiovascular;  Laterality: Bilateral;  . CAROTID ENDARTERECTOMY    . CARPAL TUNNEL RELEASE  ~ 2005   bilateral  . CATARACT EXTRACTION W/ INTRAOCULAR LENS IMPLANT  2012   right  . CATARACT EXTRACTION W/PHACO Left 10/2015   Dr Patrice Paradise at [facility]  . CORONARY ANGIOPLASTY  04/21/2012   95% stenosis mid LAD, vessel not large enough for stent, rec plavix/ASA for 1 mo  . CORONARY ANGIOPLASTY WITH STENT PLACEMENT  2002   x2  . ENDARTERECTOMY  05/09/2012   Procedure: ENDARTERECTOMY CAROTID;  Surgeon: Serafina Mitchell, MD;  Location: [facility] OR;  Service: Vascular;  Laterality: Right;  Right carotd endarterectomy with bovine patch angioplasty  . ENDARTERECTOMY Left 06/23/2015   Procedure: ENDARTERECTOMY CAROTID;  Surgeon: Serafina Mitchell, MD;  Location: [place];   Service: Vascular;  Laterality: Left;  . EYE SURGERY Left 04/2015   laser surgery,blood vessels rupturing  . holter monitor  2013   WNL  . KIDNEY STONE SURGERY     several times  . LITHOTRIPSY    . PARS PLANA VITRECTOMY Left 04/2015   [facility] Dr Danella Sensing)  . PATCH ANGIOPLASTY Left 06/23/2015   Procedure: PATCH ANGIOPLASTY USING 1cm x 4cm BOVINE PERICARDIAL PATCH;  Surgeon: Serafina Mitchell, MD;  Location: [place];  Service: Vascular;  Laterality: Left;  . PERCUTANEOUS CORONARY STENT INTERVENTION (PCI-S) N/A 04/21/2012   Procedure: PERCUTANEOUS CORONARY STENT INTERVENTION (PCI-S);  Surgeon: Annita Brod  Angelena Form, MD;  Location: [place] CATH LAB;  Service: Cardiovascular;  Laterality: N/A;  . TEE WITHOUT CARDIOVERSION  12/14/2011   Procedure: TRANSESOPHAGEAL ECHOCARDIOGRAM (TEE);  Surgeon: Larey Dresser, MD;  Location: [facility] ENDOSCOPY;  Service: Cardiovascular;  Laterality: N/A;  . US ECHOCARDIOGRAPHY  2002   normal LV fxn, mild LVH, borderline L atrial size    Allergies  Allergen Reactions  . Other Shortness Of Breath    No blood or blood products  . Penicillins Itching and Other (See Comments)    From when younger  . Toradol [Ketorolac Tromethamine] Other (See Comments)    Panic attack   . Isosorbide Nitrate Other (See Comments)    headaches  . Metformin And Related Diarrhea and Nausea And Vomiting    Current Outpatient Medications  Medication Sig Dispense Refill  . aspirin (ASPIRIN EC) 81 MG EC tablet Take 81 mg by mouth daily. Swallow whole.    . insulin NPH Human (HUMULIN N,NOVOLIN N) 100 UNIT/ML injection Inject 30 Units into the skin. Before bedtime.    Marland Kitchen lisinopril (PRINIVIL,ZESTRIL) 5 MG tablet Take 1 tablet (5 mg total) by mouth daily. 90 tablet 3  . nitroGLYCERIN (NITROSTAT) 0.4 MG SL tablet Place 0.4 mg under the tongue every 5 (five) minutes as needed. For chest pain     No current facility-administered medications for this visit.     Review of Systems : See HPI for  pertinent positives and negatives.  Physical Examination  Vitals:   07/30/17 1154 07/30/17 1158  BP: (!) 189/88 (!) 178/85  Pulse: (!) 55   Resp: 16   Temp: 98.2 F (36.8 C)   TempSrc: Oral   SpO2: 96%   Weight: 193 lb (87.5 kg)   Height: _0  (1.676 m)    Body mass index is 31.15 kg/m.  General: WDWN obese female in NAD GAIT: normal HENT: No gross abnormalities  Eyes: PERRLA Pulmonary:  Respirations are non-labored, good air movement in all fields, CTAB, nor rales, rhonchi, or wheezing. Cardiac: regular rhythm, no detected murmur.  VASCULAR EXAM Carotid Bruits Right Left   Negative Negative    Abdominal aortic pulse is not palpable. Radial pulses are 2+ palpable and equal.                                                                                                                                          LE Pulses Right Left       POPLITEAL  not palpable   not palpable       POSTERIOR TIBIAL  not palpable   not palpable        DORSALIS PEDIS      ANTERIOR TIBIAL 1+ palpable  1+ palpable     Gastrointestinal: soft, nontender, BS WNL, no r/g, no palpable masses. Musculoskeletal: No muscle atrophy/wasting. M/S 5/5 throughout, extremities without ischemic changes. Skin: No rash, no cellulitis, no  ulcers noted.  Neurologic: A&O X 3; appropriate affect, speech is normal, CN 2-12 intact, pain and light touch intact in extremities, Motor exam as listed above. Psychiatric: Normal thought content, mood appropriate to clinical situation.    Assessment: ABRIELLE FINCK is a 62 y.o. female who is status post left carotid endarterectomy on 06/23/2015 for asymptomatic stenosis, and right carotid endarterectomy on 05/09/2012. Her last stroke was in December 2015, no subsequent stroke or TIA.  Her atherosclerotic risk factors include uncontrolled DM, former smoker, CAD, dyslipidemia, and obesity.   DATA Carotid Duplex (07/30/17): No restenosis at the  bilateral CEA sites.  No significant stenosis of the bilateral ECA or CCA.  Bilateral vertebral artery flow is antegrade. Bilateral subclavian artery waveforms are normal. No change compared to the exam on 05-03-16.   Plan: Follow-up in 18 months with Carotid Duplex scan.   I discussed in depth with the patient the nature of atherosclerosis, and emphasized the importance of maximal medical management including strict control of blood pressure, blood glucose, and lipid levels, obtaining regular exercise, and continued cessation of smoking.  The patient is aware that without maximal medical management the underlying atherosclerotic disease process will progress, limiting the benefit of any interventions. The patient was given information about stroke prevention and what symptoms should prompt the patient to seek immediate medical care. Thank you for allowing [place] to participate in this patient's care.  Clemon Chambers, RN, MSN, FNP-C [facility] [place] Office: [phone]  Clinic Physician: Early  07/30/17 12:06 PM

## 2017-07-31 LAB — VAS US CAROTID
LCCADDIAS: -21 cm/s
LCCADSYS: -100 cm/s
LCCAPDIAS: 16 cm/s
LEFT ECA DIAS: -10 cm/s
LEFT VERTEBRAL DIAS: 11 cm/s
LICADDIAS: -27 cm/s
LICADSYS: -87 cm/s
LICAPDIAS: -28 cm/s
Left CCA prox sys: 68 cm/s
Left ICA prox sys: -94 cm/s
RIGHT CCA MID DIAS: -13 cm/s
RIGHT ECA DIAS: -19 cm/s
RIGHT VERTEBRAL DIAS: 13 cm/s
Right CCA prox dias: -10 cm/s
Right CCA prox sys: -52 cm/s
Right cca dist sys: -84 cm/s

## 2017-11-11 DIAGNOSIS — Z9861 Coronary angioplasty status: Secondary | ICD-10-CM | POA: Insufficient documentation

## 2017-11-14 ENCOUNTER — Other Ambulatory Visit: Payer: Self-pay | Admitting: Neurology

## 2017-11-14 DIAGNOSIS — G459 Transient cerebral ischemic attack, unspecified: Secondary | ICD-10-CM

## 2017-11-20 DIAGNOSIS — I6523 Occlusion and stenosis of bilateral carotid arteries: Secondary | ICD-10-CM | POA: Insufficient documentation

## 2017-11-27 ENCOUNTER — Ambulatory Visit: Payer: Managed Care, Other (non HMO)

## 2017-12-10 ENCOUNTER — Ambulatory Visit: Payer: Managed Care, Other (non HMO)

## 2017-12-10 ENCOUNTER — Encounter: Admission: RE | Payer: Self-pay | Source: Ambulatory Visit

## 2017-12-10 ENCOUNTER — Ambulatory Visit
Admission: RE | Admit: 2017-12-10 | Payer: Managed Care, Other (non HMO) | Source: Ambulatory Visit | Admitting: Cardiology

## 2017-12-10 SURGERY — LOOP RECORDER INSERTION
Anesthesia: LOCAL

## 2017-12-24 ENCOUNTER — Ambulatory Visit
Admission: RE | Admit: 2017-12-24 | Discharge: 2017-12-24 | Disposition: A | Discharge status: Home / Self Care | Payer: Managed Care, Other (non HMO) | Source: Ambulatory Visit | Attending: Neurology | Admitting: Neurology

## 2017-12-24 DIAGNOSIS — I6523 Occlusion and stenosis of bilateral carotid arteries: Secondary | ICD-10-CM | POA: Diagnosis not present

## 2017-12-24 DIAGNOSIS — G459 Transient cerebral ischemic attack, unspecified: Secondary | ICD-10-CM | POA: Diagnosis not present

## 2019-03-17 ENCOUNTER — Emergency Department: Payer: Self-pay

## 2019-03-17 ENCOUNTER — Other Ambulatory Visit: Payer: Self-pay

## 2019-03-17 ENCOUNTER — Encounter: Payer: Self-pay | Admitting: Emergency Medicine

## 2019-03-17 ENCOUNTER — Inpatient Hospital Stay
Admission: EM | Admit: 2019-03-17 | Discharge: 2019-03-18 | DRG: 066 | Disposition: A | Discharge status: Home / Self Care | Payer: Self-pay | Attending: Internal Medicine | Admitting: Internal Medicine

## 2019-03-17 DIAGNOSIS — Z8673 Personal history of transient ischemic attack (TIA), and cerebral infarction without residual deficits: Secondary | ICD-10-CM

## 2019-03-17 DIAGNOSIS — Z955 Presence of coronary angioplasty implant and graft: Secondary | ICD-10-CM

## 2019-03-17 DIAGNOSIS — I2510 Atherosclerotic heart disease of native coronary artery without angina pectoris: Secondary | ICD-10-CM | POA: Diagnosis present

## 2019-03-17 DIAGNOSIS — F419 Anxiety disorder, unspecified: Secondary | ICD-10-CM | POA: Diagnosis present

## 2019-03-17 DIAGNOSIS — Z833 Family history of diabetes mellitus: Secondary | ICD-10-CM

## 2019-03-17 DIAGNOSIS — I252 Old myocardial infarction: Secondary | ICD-10-CM

## 2019-03-17 DIAGNOSIS — Z8249 Family history of ischemic heart disease and other diseases of the circulatory system: Secondary | ICD-10-CM

## 2019-03-17 DIAGNOSIS — Z7982 Long term (current) use of aspirin: Secondary | ICD-10-CM

## 2019-03-17 DIAGNOSIS — Z7902 Long term (current) use of antithrombotics/antiplatelets: Secondary | ICD-10-CM

## 2019-03-17 DIAGNOSIS — R4701 Aphasia: Secondary | ICD-10-CM | POA: Diagnosis present

## 2019-03-17 DIAGNOSIS — J45909 Unspecified asthma, uncomplicated: Secondary | ICD-10-CM | POA: Diagnosis present

## 2019-03-17 DIAGNOSIS — Z961 Presence of intraocular lens: Secondary | ICD-10-CM | POA: Diagnosis present

## 2019-03-17 DIAGNOSIS — Z91040 Latex allergy status: Secondary | ICD-10-CM

## 2019-03-17 DIAGNOSIS — E785 Hyperlipidemia, unspecified: Secondary | ICD-10-CM | POA: Diagnosis present

## 2019-03-17 DIAGNOSIS — I4891 Unspecified atrial fibrillation: Secondary | ICD-10-CM | POA: Diagnosis present

## 2019-03-17 DIAGNOSIS — Z20828 Contact with and (suspected) exposure to other viral communicable diseases: Secondary | ICD-10-CM | POA: Diagnosis present

## 2019-03-17 DIAGNOSIS — IMO0002 Reserved for concepts with insufficient information to code with codable children: Secondary | ICD-10-CM | POA: Diagnosis present

## 2019-03-17 DIAGNOSIS — G8929 Other chronic pain: Secondary | ICD-10-CM | POA: Diagnosis present

## 2019-03-17 DIAGNOSIS — Z87891 Personal history of nicotine dependence: Secondary | ICD-10-CM

## 2019-03-17 DIAGNOSIS — Z794 Long term (current) use of insulin: Secondary | ICD-10-CM

## 2019-03-17 DIAGNOSIS — Z888 Allergy status to other drugs, medicaments and biological substances status: Secondary | ICD-10-CM

## 2019-03-17 DIAGNOSIS — I6782 Cerebral ischemia: Secondary | ICD-10-CM | POA: Diagnosis present

## 2019-03-17 DIAGNOSIS — Z823 Family history of stroke: Secondary | ICD-10-CM

## 2019-03-17 DIAGNOSIS — Z9841 Cataract extraction status, right eye: Secondary | ICD-10-CM

## 2019-03-17 DIAGNOSIS — Z87442 Personal history of urinary calculi: Secondary | ICD-10-CM

## 2019-03-17 DIAGNOSIS — I639 Cerebral infarction, unspecified: Principal | ICD-10-CM | POA: Diagnosis present

## 2019-03-17 DIAGNOSIS — F40240 Claustrophobia: Secondary | ICD-10-CM | POA: Diagnosis present

## 2019-03-17 DIAGNOSIS — R471 Dysarthria and anarthria: Secondary | ICD-10-CM

## 2019-03-17 DIAGNOSIS — E1165 Type 2 diabetes mellitus with hyperglycemia: Secondary | ICD-10-CM | POA: Diagnosis present

## 2019-03-17 DIAGNOSIS — Z79899 Other long term (current) drug therapy: Secondary | ICD-10-CM

## 2019-03-17 DIAGNOSIS — I10 Essential (primary) hypertension: Secondary | ICD-10-CM | POA: Diagnosis present

## 2019-03-17 DIAGNOSIS — R29700 NIHSS score 0: Secondary | ICD-10-CM | POA: Diagnosis present

## 2019-03-17 DIAGNOSIS — E1151 Type 2 diabetes mellitus with diabetic peripheral angiopathy without gangrene: Secondary | ICD-10-CM | POA: Diagnosis present

## 2019-03-17 DIAGNOSIS — Z880 Allergy status to penicillin: Secondary | ICD-10-CM

## 2019-03-17 LAB — CBC
HCT: 40.8 % (ref 36.0–46.0)
Hemoglobin: 13.7 g/dL (ref 12.0–15.0)
MCH: 28.3 pg (ref 26.0–34.0)
MCHC: 33.6 g/dL (ref 30.0–36.0)
MCV: 84.3 fL (ref 80.0–100.0)
Platelets: 374 10*3/uL (ref 150–400)
RBC: 4.84 MIL/uL (ref 3.87–5.11)
RDW: 12.3 % (ref 11.5–15.5)
WBC: 6.9 10*3/uL (ref 4.0–10.5)
nRBC: 0 % (ref 0.0–0.2)

## 2019-03-17 LAB — DIFFERENTIAL
Abs Immature Granulocytes: 0.01 10*3/uL (ref 0.00–0.07)
Basophils Absolute: 0.1 10*3/uL (ref 0.0–0.1)
Basophils Relative: 1 %
Eosinophils Absolute: 0.3 10*3/uL (ref 0.0–0.5)
Eosinophils Relative: 4 %
Immature Granulocytes: 0 %
Lymphocytes Relative: 45 %
Lymphs Abs: 3.1 10*3/uL (ref 0.7–4.0)
Monocytes Absolute: 0.5 10*3/uL (ref 0.1–1.0)
Monocytes Relative: 7 %
Neutro Abs: 2.9 10*3/uL (ref 1.7–7.7)
Neutrophils Relative %: 43 %

## 2019-03-17 LAB — COMPREHENSIVE METABOLIC PANEL
ALT: 19 U/L (ref 0–44)
AST: 24 U/L (ref 15–41)
Albumin: 4.3 g/dL (ref 3.5–5.0)
Alkaline Phosphatase: 82 U/L (ref 38–126)
Anion gap: 14 (ref 5–15)
BUN: 19 mg/dL (ref 8–23)
CO2: 26 mmol/L (ref 22–32)
Calcium: 9.9 mg/dL (ref 8.9–10.3)
Chloride: 96 mmol/L — ABNORMAL LOW (ref 98–111)
Creatinine, Ser: 0.96 mg/dL (ref 0.44–1.00)
GFR calc Af Amer: >60 mL/min (ref >60)
GFR calc non Af Amer: >60 mL/min (ref >60)
Glucose, Bld: 359 mg/dL — ABNORMAL HIGH (ref 70–99)
Potassium: 4.2 mmol/L (ref 3.5–5.1)
Sodium: 136 mmol/L (ref 135–145)
Total Bilirubin: 0.7 mg/dL (ref 0.3–1.2)
Total Protein: 7.3 g/dL (ref 6.5–8.1)

## 2019-03-17 LAB — PROTIME-INR
INR: 1 (ref 0.8–1.2)
Prothrombin Time: 12.8 seconds (ref 11.4–15.2)

## 2019-03-17 LAB — APTT: aPTT: 30 seconds (ref 24–36)

## 2019-03-17 MED ORDER — SODIUM CHLORIDE 0.9 % IV BOLUS
1000.0000 mL | Freq: Once | INTRAVENOUS | Status: AC
  Administered 2019-03-17: 1000 mL via INTRAVENOUS

## 2019-03-17 MED ORDER — ASPIRIN 81 MG PO CHEW
324.0000 mg | CHEWABLE_TABLET | Freq: Once | ORAL | Status: AC
  Administered 2019-03-17: 23:00:00 324 mg via ORAL
  Filled 2019-03-17: qty 4

## 2019-03-17 MED ORDER — SODIUM CHLORIDE 0.9 % IV BOLUS: 1000.0000 mL | Freq: Once | INTRAVENOUS | Status: DC

## 2019-03-17 NOTE — H&P (Signed)
[facility] NAME: [name]    MR#:  [id number]  DATE OF BIRTH:  03/19/1956  DATE OF ADMISSION:  03/17/2019  PRIMARY CARE PHYSICIAN: [place] Pink, MD   REQUESTING/REFERRING PHYSICIAN: Ellender Hose, MD  CHIEF COMPLAINT:   Chief Complaint  Patient presents with  . Aphasia    HISTORY OF PRESENT ILLNESS:  [name]  is a 63 y.o. female who presents with chief complaint as above.  Patient presents the ED with a complaint of dysarthria.  She states that she woke up with slurred speech and it persisted throughout most of the day.  It has slowly resolved.  She has prior history of multiple strokes.  CT imaging tonight questions new stroke.  Hospitalist called for admission. Of note, patient states she does have a history of A. fib, as well as significant other vascular disease including heart disease.  She states that she recently started a new diet and has lost quite a bit of weight.  Given the fact she felt that maybe she could stop taking some of her medicines, and has not been on any of her medicines recently.  She is in process of transitioning to a new cardiologist.  She states that her prior cardiologist was with [facility] but that she received a letter stating she would need a new one.  PAST MEDICAL HISTORY:   Past Medical History:  Diagnosis Date  . Anxiety    a. with claustrophobia  . Arthritis    "right hand; maybe just starting in my left hand" (04/21/2012)  . Asthma   . CAD (coronary artery disease)    a. PCI [place] 2004. b. Angina - balloon angioplasty to LAD 04/21/12 (vessel too small for stent).  . Carotid stenosis    a. 11/2011 critical R, ~30% L by angiogram;  b. 04/2012 s/p R CEA.  . Cataract    left  . Chronic lower back pain   . Depression    a. occasionally takes st john's wart  . Diabetes mellitus type 2, uncontrolled ([place])   . Embolic stroke ([place])    a. 11/2011 MRI: mult small b/l  infarcts c/w emboli;  b. 11/2011 MRA neck: high grade RICA stenosis;  c. 6/20013 TEE: EF 55-60% with mild LVH, severe MAC, < 1 cm calc mobile structure on the MV annulus, ? 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 Thought was RICA stenosis did not explain the b/l small strokes; concern for emboli from mobile MV struct;  d. 01/2012 Event Mon:  no AFib  . History of echocardiogram    Echo 12/16: mild LVH, EF 55-60%, no RWMA, Gr 2 DD, trivial AI, MAC, mild MR, mild LAE  . History of kidney stones   . History of nuclear stress test    a. Myoview 12/16: EF 46%, no ischemia, Intermediate Risk (low EF)  . HLD (hyperlipidemia)    a. takes Lipitor daily  . Hypertension, accelerated   . 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 visual changes  . Myocardial infarction [facility])    prior to 2013  . Peripheral neuropathy   . Refusal of blood transfusions as patient is Jehovah's Witness   . Stroke ([place]) 2015- dec   mini stroke  . Uncontrolled type 2 diabetes mellitus with circulatory disorder, with long-term current use of insulin ([place]) 05/10/2015     PAST SURGICAL HISTORY:   Past Surgical History:  Procedure Laterality  Date  . ANKLE SURGERY  2005   after fall down stairs, left with screws and plate in place  . CARDIAC CATHETERIZATION  2004   WNL, no significant coronary disease  . CARDIAC CATHETERIZATION  12/19/11  . CAROTID ANGIOGRAM Bilateral 12/19/2011   Procedure: CAROTID ANGIOGRAM;  Surgeon: Serafina Mitchell, MD;  Location: [facility] CATH LAB;  Service: Cardiovascular;  Laterality: Bilateral;  . CAROTID ENDARTERECTOMY    . CARPAL TUNNEL RELEASE  ~ 2005   bilateral  . CATARACT EXTRACTION W/ INTRAOCULAR LENS IMPLANT  2012   right  . CATARACT EXTRACTION W/PHACO Left 10/2015   Dr Patrice Paradise at [facility]  . CORONARY ANGIOPLASTY  04/21/2012   95% stenosis mid LAD, vessel not large enough for stent, rec plavix/ASA for 1 mo  . CORONARY ANGIOPLASTY WITH STENT PLACEMENT  2002   x2  . ENDARTERECTOMY   05/09/2012   Procedure: ENDARTERECTOMY CAROTID;  Surgeon: Serafina Mitchell, MD;  Location: [facility] OR;  Service: Vascular;  Laterality: Right;  Right carotd endarterectomy with bovine patch angioplasty  . ENDARTERECTOMY Left 06/23/2015   Procedure: ENDARTERECTOMY CAROTID;  Surgeon: Serafina Mitchell, MD;  Location: [place];  Service: Vascular;  Laterality: Left;  . EYE SURGERY Left 04/2015   laser surgery,blood vessels rupturing  . holter monitor  2013   WNL  . KIDNEY STONE SURGERY     several times  . LITHOTRIPSY    . PARS PLANA VITRECTOMY Left 04/2015   [facility] Dr Danella Sensing)  . PATCH ANGIOPLASTY Left 06/23/2015   Procedure: PATCH ANGIOPLASTY USING 1cm x 4cm BOVINE PERICARDIAL PATCH;  Surgeon: Serafina Mitchell, MD;  Location: [place];  Service: Vascular;  Laterality: Left;  . PERCUTANEOUS CORONARY STENT INTERVENTION (PCI-S) N/A 04/21/2012   Procedure: PERCUTANEOUS CORONARY STENT INTERVENTION (PCI-S);  Surgeon: Burnell Blanks, MD;  Location: [facility] CATH LAB;  Service: Cardiovascular;  Laterality: N/A;  . TEE WITHOUT CARDIOVERSION  12/14/2011   Procedure: TRANSESOPHAGEAL ECHOCARDIOGRAM (TEE);  Surgeon: Larey Dresser, MD;  Location: [place];  Service: Cardiovascular;  Laterality: N/A;  . US ECHOCARDIOGRAPHY  2002   normal LV fxn, mild LVH, borderline L atrial size     SOCIAL HISTORY:   Social History   Tobacco Use  . Smoking status: Former Smoker    Packs/day: 0.00    Years: 3.00    Pack years: 0.00    Types: Cigarettes    Quit date: 06/26/1975    Years since quitting: 43.7  . Smokeless tobacco: Never Used  Substance Use Topics  . Alcohol use: No    Comment: none since 11/2011     FAMILY HISTORY:   Family History  Problem Relation Age of Onset  . Heart disease Mother        valve problems  . Stroke Mother   . Hypertension Mother   . Heart disease Sister        valve problems  . Heart disease Brother   . Diabetes Father   . Hypertension Father   . AAA (abdominal aortic  aneurysm) Father   . Cancer Neg Hx      DRUG ALLERGIES:   Allergies  Allergen Reactions  . Other Shortness Of Breath    No blood or blood products  . Penicillins Itching and Rash    From when younger Has patient had a PCN reaction causing immediate rash, facial/tongue/throat swelling, SOB or lightheadedness with hypotension: No Has patient had a PCN reaction causing severe rash involving mucus membranes or skin necrosis:  No Has patient had a PCN reaction that required hospitalization: No Has patient had a PCN reaction occurring within the last 10 years: No If all of the above answers are "NO", then may proceed with Cephalosporin use.    . Toradol [Ketorolac Tromethamine] Other (See Comments)    Panic attack   . Isosorbide Nitrate Other (See Comments)    headaches  . Latex Rash    Rash on all points of contact (thick, heavy itching rash)  . Metformin And Related Diarrhea and Nausea And Vomiting    MEDICATIONS AT HOME:   Prior to Admission medications   Medication Sig Start Date End Date Taking? Authorizing Provider  albuterol (PROVENTIL HFA;VENTOLIN HFA) 108 (90 Base) MCG/ACT inhaler Inhale 2 puffs into the lungs every 6 (six) hours as needed for wheezing.  12/03/17   [provider]  aspirin (ASPIRIN EC) 81 MG EC tablet Take 81 mg by mouth daily.     [provider]  aspirin-acetaminophen-caffeine (EXCEDRIN MIGRAINE) [phone] MG tablet Take 2 tablets by mouth every 6 (six) hours as needed for headache.    [provider]  benzonatate (TESSALON) 200 MG capsule Take 200 mg by mouth 3 (three) times daily. 12/03/17   [provider]  bisoprolol (ZEBETA) 5 MG tablet Take 5 mg by mouth daily. 11/18/17   [provider]  clopidogrel (PLAVIX) 75 MG tablet Take 75 mg by mouth daily. 11/14/17   [provider]  doxycycline (VIBRAMYCIN) 100 MG capsule Take 100 mg by mouth 2 (two) times daily. 12/03/17   [provider]  insulin  NPH Human (HUMULIN N,NOVOLIN N) 100 UNIT/ML injection Inject 30 Units into the skin at bedtime.     [provider]  lisinopril (PRINIVIL,ZESTRIL) 5 MG tablet Take 1 tablet (5 mg total) by mouth daily. Patient not taking: Reported on 12/04/2017 11/23/15   Larey Dresser, MD  OIL OF [place]ANO PO Take 3 capsules by mouth daily.    [provider]    REVIEW OF SYSTEMS:  Review of Systems  Constitutional: Negative for chills, fever, malaise/fatigue and weight loss.  HENT: Negative for ear pain, hearing loss and tinnitus.   Eyes: Negative for blurred vision, double vision, pain and redness.  Respiratory: Negative for cough, hemoptysis and shortness of breath.   Cardiovascular: Negative for chest pain, palpitations, orthopnea and leg swelling.  Gastrointestinal: Negative for abdominal pain, constipation, diarrhea, nausea and vomiting.  Genitourinary: Negative for dysuria, frequency and hematuria.  Musculoskeletal: Negative for back pain, joint pain and neck pain.  Skin:       No acne, rash, or lesions  Neurological: Positive for speech change. Negative for dizziness, tremors, focal weakness and weakness.  Endo/Heme/Allergies: Negative for polydipsia. Does not bruise/bleed easily.  Psychiatric/Behavioral: Negative for depression. The patient is not nervous/anxious and does not have insomnia.      VITAL SIGNS:   Vitals:   03/17/19 2130 03/17/19 2200 03/17/19 2230 03/17/19 2245  BP: (!) 75/65 94/60 (!) 84/74   Pulse: 83 78  78  Resp: 10 13  (!) 8  Temp:      TempSrc:      SpO2: 97% 97%  99%   Wt Readings from Last 3 Encounters:  07/30/17 87.5 kg  05/03/16 93.9 kg  11/23/15 90.3 kg    PHYSICAL EXAMINATION:  Physical Exam  Vitals reviewed. Constitutional: She is oriented to person, place, and time. She appears well-developed and well-nourished. No distress.  HENT:  Head: Normocephalic and atraumatic.  Mouth/Throat: Oropharynx is clear and moist.  Eyes: Pupils are  equal, round, and reactive to light. Conjunctivae and EOM are normal. No scleral icterus.  Neck: Normal range of motion. Neck supple. No JVD present. No thyromegaly present.  Cardiovascular: Normal rate, regular rhythm and intact distal pulses. Exam reveals no gallop and no friction rub.  No murmur heard. Respiratory: Effort normal and breath sounds normal. No respiratory distress. She has no wheezes. She has no rales.  GI: Soft. Bowel sounds are normal. She exhibits no distension. There is no abdominal tenderness.  Musculoskeletal: Normal range of motion.        General: No edema.     Comments: No arthritis, no gout  Lymphadenopathy:    She has no cervical adenopathy.  Neurological: She is alert and oriented to person, place, and time. No cranial nerve deficit.  Neurologic: Cranial nerves II-XII intact, Sensation intact to light touch/pinprick, 5/5 strength in all extremities, no dysarthria, no aphasia, no dysphagia, memory intact, finger to nose testing showed no abnormality, no pronator drift  Skin: Skin is warm and dry. No rash noted. No erythema.  Psychiatric: She has a normal mood and affect. Her behavior is normal. Judgment and thought content normal.    LABORATORY PANEL:   CBC Recent Labs  Lab 03/17/19 2016  WBC 6.9  HGB 13.7  HCT 40.8  PLT 374   ------------------------------------------------------------------------------------------------------------------  Chemistries  Recent Labs  Lab 03/17/19 2016  NA 136  K 4.2  CL 96*  CO2 26  GLUCOSE 359*  BUN 19  CREATININE 0.96  CALCIUM 9.9  AST 24  ALT 19  ALKPHOS 82  BILITOT 0.7   ------------------------------------------------------------------------------------------------------------------  Cardiac Enzymes No results for input(s): TROPONINI in the last 168 hours. ------------------------------------------------------------------------------------------------------------------  RADIOLOGY:  Ct Head Wo  Contrast  Result Date: 03/17/2019 CLINICAL DATA:  Slurred speech and right facial droop EXAM: CT HEAD WITHOUT CONTRAST TECHNIQUE: Contiguous axial images were obtained from the base of the skull through the vertex without intravenous contrast. COMPARISON:  May 24, 2014 FINDINGS: Brain: Age related volume loss is stable. There is no intracranial mass, hemorrhage, extra-axial fluid collection, or midline shift. There is evidence of a prior infarct in the right occipital lobe, not present on the 2015 study. There is evidence of a prior infarct in the right mid thalamus. There is evidence of a prior small infarct in the posterior most aspect of the head of the caudate nucleus on the left, stable. There is small vessel disease in the centra semiovale bilaterally. Several areas of decreased attenuation in the left centrum semiovale potentially could represent recent and possibly acute white matter infarcts. Elsewhere brain parenchyma appears unremarkable. Vascular: There is no appreciable hyperdense vessel. There is calcification in each carotid siphon region. Skull: The bony calvarium appears intact. Sinuses/Orbits: There is opacification in a posterior ethmoid air cell on the left. Other visualized paranasal sinuses are clear. Visualized orbits appear symmetric bilaterally. Other: Visualized mastoid air cells are clear. IMPRESSION: 1. Age related volume loss. Several prior infarcts including infarcts in the right occipital lobe, right thalamus, and posterior aspect of the left head of caudate nucleus. There is periventricular small vessel disease in the centra semiovale. Question potential recent and possibly acute infarct or infarcts in the left centrum semiovale somewhat inferiorly. 2.  No mass or hemorrhage evident. 3.  Multiple foci of arterial vascular calcification. 4.  Opacification of a posterior left ethmoid air cell. Electronically Signed   By: Lowella Grip III M.D.  On: 03/17/2019 21:05     EKG:   Orders placed or performed during the hospital encounter of 03/17/19  . EKG 12-Lead  . EKG 12-Lead  . ED EKG  . ED EKG  . ED EKG  . ED EKG    IMPRESSION AND PLAN:  Principal Problem:   Stroke [facility]) -admit per stroke admission order set with appropriate imaging, consults including neurology consult, labs Active Problems:   CAD (coronary artery disease) -cardiology consult with see [facility] for patient to be seen regarding benefit of anticoagulation and restarting other meds for her heart disease   Diabetes mellitus type 2, uncontrolled ([place]) -sliding scale insulin coverage   HLD (hyperlipidemia) -patient would likely need to restart antilipid medication  Chart review performed and case discussed with ED provider. Labs, imaging and/or ECG reviewed by provider and discussed with patient/family. Management plans discussed with the patient and/or family.  COVID-19 status: Pending  DVT PROPHYLAXIS: SubQ lovenox   GI PROPHYLAXIS:  None  ADMISSION STATUS: Inpatient     CODE STATUS: Full Code Status History    Date Active Date Inactive Code Status Order ID Comments User Context   06/23/2015 1514 06/24/2015 1329 Full Code [id number]  Ansel Bong Inpatient   05/09/2012 1306 05/10/2012 1318 Full Code [id number]  Shona Simpson, RN Inpatient   12/12/2011 1558 12/14/2011 2126 Full Code [id number]  Karilyn Cota, RN Inpatient   Advance Care Planning Activity      TOTAL TIME TAKING CARE OF THIS PATIENT: 45 minutes.   This patient was evaluated in the context of the global COVID-19 pandemic, which necessitated consideration that the patient might be at risk for infection with the SARS-CoV-2 virus that causes COVID-19. Institutional protocols and algorithms that pertain to the evaluation of patients at risk for COVID-19 are in a state of rapid change based on information released by regulatory bodies including the CDC and federal and state organizations. These  policies and algorithms were followed to the best of this provider's knowledge to date during the patient's care at this facility.  Ethlyn Daniels 03/17/2019, 11:57 PM  [facility]  Office  [phone]  CC: Primary care physician; [place] Pink, MD  Note:  This document was prepared using Dragon voice recognition software and may include unintentional dictation errors.

## 2019-03-17 NOTE — ED Triage Notes (Signed)
Patient to ER for c/o slurred speech and right facial droop since this morning. Patient states she went to bed at approx 0100 last night (this am), woke up this am with symptoms present. Patient reports history of CVA. Patient reports speech still sounds slurred to her, but perhaps has improved.

## 2019-03-17 NOTE — ED Provider Notes (Signed)
[facility] Emergency Department Provider Note  ____________________________________________   First MD Initiated Contact with Patient 03/17/19 2030     (approximate)  I have reviewed the triage vital signs and the nursing notes.   HISTORY  Chief Complaint Aphasia    HPI [name] is a 63 y.o. female  With h/o CAD, carotid stenosis, embolic strokes, here with slurred speech. Pt states she last felt normal at around 1 AM. She went to bed feeling fine. Woke up once to use the restroom and felt like her vision was "moving around sideways." Went back to bed. Awoke this AM and felt like her tongue was "thick" and she had difficulty correctly announcing her words. She has had a mild HA as well. No focal weakness or numbness. No vision changes currently. Slurred speech has persisted since onset. No other deficits. No fever, chills. No recent med changes. She has been taking an ASA but no other blood thinners.       Past Medical History:  Diagnosis Date   Anxiety    a. with claustrophobia   Arthritis    "right hand; maybe just starting in my left hand" (04/21/2012)   Asthma    CAD (coronary artery disease)    a. PCI [place] 2004. b. Angina - balloon angioplasty to LAD 04/21/12 (vessel too small for stent).   Carotid stenosis    a. 11/2011 critical R, ~30% L by angiogram;  b. 04/2012 s/p R CEA.   Cataract    left   Chronic lower back pain    Depression    a. occasionally takes st john's wart   Diabetes mellitus type 2, uncontrolled ([place])    Embolic stroke ([place])    a. 11/2011 MRI: mult small b/l infarcts c/w emboli;  b. 11/2011 MRA neck: high grade RICA stenosis;  c. 6/20013 TEE: EF 55-60% with mild LVH, severe MAC, < 1 cm calc mobile structure on the MV annulus, ? 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 Thought was RICA stenosis did not explain the b/l small strokes; concern for emboli from mobile MV struct;  d. 01/2012 Event Mon:   no AFib   History of echocardiogram    Echo 12/16: mild LVH, EF 55-60%, no RWMA, Gr 2 DD, trivial AI, MAC, mild MR, mild LAE   History of kidney stones    History of nuclear stress test    a. Myoview 12/16: EF 46%, no ischemia, Intermediate Risk (low EF)   HLD (hyperlipidemia)    a. 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 visual changes   Myocardial infarction [facility])    prior to 2013   Peripheral neuropathy    Refusal of blood transfusions as patient is Jehovah's Witness    Stroke ([employer]) 2015- dec   mini stroke   Uncontrolled type 2 diabetes mellitus with circulatory disorder, with long-term current use of insulin ([place]) 05/10/2015    Patient Active Problem List   Diagnosis Date Noted   Stroke [facility])    Refusal of blood transfusions as patient is Jehovah's Witness    Peripheral neuropathy    Myocardial infarction ([place])    Migraine aura without headache    HLD (hyperlipidemia)    History of nuclear stress test    History of kidney stones    History of echocardiogram    Diabetes mellitus type 2, uncontrolled ([place])    Depression  Cataract    Chronic lower back pain    Asthma    Arthritis    Anxiety    Carotid stenosis, asymptomatic 06/23/2015   Mitral valve annular calcification 05/11/2015   Uncontrolled type 2 diabetes mellitus with circulatory disorder, with long-term current use of insulin ([place]) 05/10/2015   Breast mass, left 01/10/2012   Carotid stenosis 24/81/8590   Embolic stroke ([place]) 93/04/2161   Hypertension, accelerated 12/12/2011   CAD (coronary artery disease) 12/12/2011    Past Surgical History:  Procedure Laterality Date   ANKLE SURGERY  2005   after fall down stairs, left with screws and plate in place   CARDIAC CATHETERIZATION  2004   WNL, no significant coronary disease   CARDIAC CATHETERIZATION  12/19/11   CAROTID ANGIOGRAM Bilateral 12/19/2011   Procedure: CAROTID  ANGIOGRAM;  Surgeon: Serafina Mitchell, MD;  Location[facility] C[facility] LAB;  Service: Cardiovascular;  Laterality: Bilateral;   CAROTID ENDARTERECTOMY     CARPAL TUNNEL RELEASE  ~ 2005   bilateral   CATARACT EXTRACTION W/ INTRAOCULAR LENS IMPLANT  2012   right   CATARACT EXTRACTION W/PHACO Left 10/2015   Dr Patrice Paradise at [place]  04/21/2012   95% stenosis mid LAD, vessel not large enough for stent, rec plavix/ASA for 1 mo   CORONARY ANGIOPLASTY WITH STENT PLACEMENT  2002   x2   ENDARTERECTOMY  05/09/2012   Procedure: ENDARTERECTOMY CAROTID;  Surgeon: Serafina Mitchell, MD;  Location: [place];  Service: Vascular;  Laterality: Right;  Right carotd endarterectomy with bovine patch angioplasty   ENDARTERECTOMY Left 06/23/2015   Procedure: ENDARTERECTOMY CAROTID;  Surgeon: Serafina Mitchell, MD;  Location: [place];  Service: Vascular;  Laterality: Left;   EYE SURGERY Left 04/2015   laser surgery,blood vessels rupturing   holter monitor  2013   WNL   KIDNEY STONE SURGERY     several times   LITHOTRIPSY     PARS PLANA VITRECTOMY Left 04/2015   ([facility] Dr Danella Sensing)   [place] Left 06/23/2015   Procedure: PATCH ANGIOPLASTY USING 1cm x 4cm BOVINE PERICARDIAL PATCH;  Surgeon: Serafina Mitchell, MD;  Location: [place];  Service: Vascular;  Laterality: Left;   PERCUTANEOUS CORONARY STENT INTERVENTION (PCI-S) N/A 04/21/2012   Procedure: PERCUTANEOUS CORONARY STENT INTERVENTION (PCI-S);  Surgeon: Burnell Blanks, MD;  Location: [facility] CATH LAB;  Service: Cardiovascular;  Laterality: N/A;   TEE WITHOUT CARDIOVERSION  12/14/2011   Procedure: TRANSESOPHAGEAL ECHOCARDIOGRAM (TEE);  Surgeon: Larey Dresser, MD;  Location: [place];  Service: Cardiovascular;  Laterality: N/A;   US ECHOCARDIOGRAPHY  2002   normal LV fxn, mild LVH, borderline L atrial size    Prior to Admission medications   Not on File    Allergies Other, Penicillins, Toradol [ketorolac tromethamine],  Isosorbide nitrate, Latex, and Metformin and related  Family History  Problem Relation Age of Onset   Heart disease Mother        valve problems   Stroke Mother    Hypertension Mother    Heart disease Sister        valve problems   Heart disease Brother    Diabetes Father    Hypertension Father    AAA (abdominal aortic aneurysm) Father    Cancer Neg Hx     Social History Social History   Tobacco Use   Smoking status: Former Smoker    Packs/day: 0.00    Years: 3.00    Pack years: 0.00  Types: Cigarettes    Quit date: 06/26/1975    Years since quitting: 43.7   Smokeless tobacco: Never Used  Substance Use Topics   Alcohol use: No    Comment: none since 11/2011   Drug use: No    Review of Systems  Review of Systems  Constitutional: Positive for fatigue. Negative for fever.  HENT: Negative for congestion and sore throat.   Eyes: Negative for visual disturbance.  Respiratory: Negative for cough and shortness of breath.   Cardiovascular: Negative for chest pain.  Gastrointestinal: Negative for abdominal pain, diarrhea, nausea and vomiting.  Genitourinary: Negative for flank pain.  Musculoskeletal: Negative for back pain and neck pain.  Skin: Negative for rash and wound.  Neurological: Positive for dizziness, speech difficulty and headaches. Negative for weakness.     ____________________________________________  PHYSICAL EXAM:      VITAL SIGNS: ED Triage Vitals  Enc Vitals Group     BP 03/17/19 2013 (!) 186/106     Pulse Rate 03/17/19 2013 (!) 102     Resp 03/17/19 2013 20     Temp 03/17/19 2013 98.5 F (36.9 C)     Temp Source 03/17/19 2013 Oral     SpO2 03/17/19 2013 99 %     Weight --      Height --      Head Circumference --      Peak Flow --      Pain Score 03/17/19 2014 0     Pain Loc --      Pain Edu? --      Excl. in [place]? --      Physical Exam Vitals signs and nursing note reviewed.  Constitutional:      General: She is not in  acute distress.    Appearance: She is well-developed.  HENT:     Head: Normocephalic and atraumatic.  Eyes:     Conjunctiva/sclera: Conjunctivae normal.  Neck:     Musculoskeletal: Neck supple.  Cardiovascular:     Rate and Rhythm: Normal rate and regular rhythm.     Heart sounds: Normal heart sounds. No murmur. No friction rub.  Pulmonary:     Effort: Pulmonary effort is normal. No respiratory distress.     Breath sounds: Normal breath sounds. No wheezing or rales.  Abdominal:     General: There is no distension.     Palpations: Abdomen is soft.     Tenderness: There is no abdominal tenderness.  Skin:    General: Skin is warm.     Capillary Refill: Capillary refill takes less than 2 seconds.  Neurological:     Mental Status: She is alert and oriented to person, place, and time.     Motor: No abnormal muscle tone.     Neurological Exam:  Mental Status: Alert and oriented to person, place, and time. Attention and concentration normal. Speech with moderate dysarthria. Recent memory is intact. Cranial Nerves: Visual fields grossly intact. EOMI and PERRLA. No nystagmus noted. Facial sensation intact at forehead, maxillary cheek, and chin/mandible bilaterally. No facial asymmetry or weakness. Hearing grossly normal. Uvula is midline, and palate elevates symmetrically. Normal SCM and trapezius strength. Tongue midline without fasciculations. Motor: Muscle strength 5/5 in proximal and distal UE and LE bilaterally. No pronator drift. Muscle tone normal. Reflexes: 2+ and symmetrical in all four extremities.  Sensation: Intact to light touch in upper and lower extremities distally bilaterally.  Gait: Normal without ataxia. Coordination: Normal FTN bilaterally.     ____________________________________________  LABS (all labs ordered are listed, but only abnormal results are displayed)  Labs Reviewed  COMPREHENSIVE METABOLIC PANEL - Abnormal; Notable for the following components:       Result Value   Chloride 96 (*)    Glucose, Bld 359 (*)    All other components within normal limits  SARS CORONAVIRUS 2 (TAT 6-24 HRS)  PROTIME-INR  APTT  CBC  DIFFERENTIAL  LACTIC ACID, PLASMA  LACTIC ACID, PLASMA  URINALYSIS, COMPLETE (UACMP) WITH MICROSCOPIC    ____________________________________________  EKG: Sinus tachycardia, VR 103. PR 152, QRS 106. Evidence of old inferior infarct on EKG. No acute ST-t segment changes. ________________________________________  RADIOLOGY All imaging, including plain films, CT scans, and ultrasounds, independently reviewed by me, and interpretations confirmed via formal radiology reads.  ED MD interpretation:   None  Official radiology report(s): Ct Head Wo Contrast  Result Date: 03/17/2019 CLINICAL DATA:  Slurred speech and right facial droop EXAM: CT HEAD WITHOUT CONTRAST TECHNIQUE: Contiguous axial images were obtained from the base of the skull through the vertex without intravenous contrast. COMPARISON:  May 24, 2014 FINDINGS: Brain: Age related volume loss is stable. There is no intracranial mass, hemorrhage, extra-axial fluid collection, or midline shift. There is evidence of a prior infarct in the right occipital lobe, not present on the 2015 study. There is evidence of a prior infarct in the right mid thalamus. There is evidence of a prior small infarct in the posterior most aspect of the head of the caudate nucleus on the left, stable. There is small vessel disease in the centra semiovale bilaterally. Several areas of decreased attenuation in the left centrum semiovale potentially could represent recent and possibly acute white matter infarcts. Elsewhere brain parenchyma appears unremarkable. Vascular: There is no appreciable hyperdense vessel. There is calcification in each carotid siphon region. Skull: The bony calvarium appears intact. Sinuses/Orbits: There is opacification in a posterior ethmoid air cell on the left. Other  visualized paranasal sinuses are clear. Visualized orbits appear symmetric bilaterally. Other: Visualized mastoid air cells are clear. IMPRESSION: 1. Age related volume loss. Several prior infarcts including infarcts in the right occipital lobe, right thalamus, and posterior aspect of the left head of caudate nucleus. There is periventricular small vessel disease in the centra semiovale. Question potential recent and possibly acute infarct or infarcts in the left centrum semiovale somewhat inferiorly. 2.  No mass or hemorrhage evident. 3.  Multiple foci of arterial vascular calcification. 4.  Opacification of a posterior left ethmoid air cell. Electronically Signed   By: Lowella Grip III M.D.   On: 03/17/2019 21:05   Dg Chest Portable 1 View  Result Date: 03/18/2019 CLINICAL DATA:  Altered mental status. Slurred speech and right facial droop. EXAM: PORTABLE CHEST 1 VIEW COMPARISON:  Radiograph and CT 06/25/2015 FINDINGS: The cardiomediastinal contours are normal. Subsegmental atelectasis at the bases. Pulmonary vasculature is normal. No consolidation, pleural effusion, or pneumothorax. No acute osseous abnormalities are seen. Surgical clips in the right neck soft tissues. IMPRESSION: Subsegmental atelectasis at the bases. Otherwise no acute abnormality. Electronically Signed   By: Keith Rake M.D.   On: 03/18/2019 00:35    ____________________________________________  PROCEDURES   Procedure(s) performed (including Critical Care):  Procedures  ____________________________________________  INITIAL IMPRESSION / MDM / [place] / ED COURSE  As part of my medical decision making, I reviewed the following data within the electronic MEDICAL RECORD NUMBER Notes from prior ED visits and [place] Controlled Substance Database      *  [name] was evaluated in Emergency Department on 03/18/2019 for the symptoms described in the history of present illness. She was evaluated in the context  of the global COVID-19 pandemic, which necessitated consideration that the patient might be at risk for infection with the SARS-CoV-2 virus that causes COVID-19. Institutional protocols and algorithms that pertain to the evaluation of patients at risk for COVID-19 are in a state of rapid change based on information released by regulatory bodies including the CDC and federal and state organizations. These policies and algorithms were followed during the patient's care in the ED.  Some ED evaluations and interventions may be delayed as a result of limited staffing during the pandemic.*      Medical Decision Making: 63 yo F here with slurred speech. Extensive h/o vascular disease. She is outside the tPA window with no signs of LVO. CT scan shows likely new small infarcts but no bleed. ASA, admit for stroke work-up and treatment. Of note, pt took her husbands BP med prior to arrival and became hypotensive - improved with fluids.  ____________________________________________  FINAL CLINICAL IMPRESSION(S) / ED DIAGNOSES  Final diagnoses:  Dysarthria  Cerebrovascular accident (CVA), unspecified mechanism ([place])     MEDICATIONS GIVEN DURING THIS VISIT:  Medications  sodium chloride 0.9 % bolus 1,000 mL (has no administration in time range)  aspirin chewable tablet 324 mg (324 mg Oral Given 03/17/19 2232)  sodium chloride 0.9 % bolus 1,000 mL (0 mLs Intravenous Stopped 03/18/19 0053)     ED Discharge Orders    None       Note:  This document was prepared using Dragon voice recognition software and may include unintentional dictation errors.   Duffy Bruce, MD 03/18/19 [phone]

## 2019-03-17 NOTE — ED Notes (Signed)
Pt reports some difficulty swallowing. Pt passed stroke swallow screen. Pt reports vision is off and is hard to describe.

## 2019-03-18 ENCOUNTER — Inpatient Hospital Stay: Admit: 2019-03-18 | Payer: Self-pay

## 2019-03-18 ENCOUNTER — Other Ambulatory Visit: Payer: Self-pay

## 2019-03-18 ENCOUNTER — Inpatient Hospital Stay
Admit: 2019-03-18 | Discharge: 2019-03-18 | Disposition: A | Discharge status: Home / Self Care | Payer: Self-pay | Attending: Internal Medicine | Admitting: Internal Medicine

## 2019-03-18 ENCOUNTER — Inpatient Hospital Stay: Payer: Self-pay

## 2019-03-18 DIAGNOSIS — I639 Cerebral infarction, unspecified: Principal | ICD-10-CM

## 2019-03-18 LAB — LIPID PANEL
Cholesterol: 213 mg/dL — ABNORMAL HIGH (ref 0–200)
HDL: 51 mg/dL (ref >40)
LDL Cholesterol: 145 mg/dL — ABNORMAL HIGH (ref 0–99)
Total CHOL/HDL Ratio: 4.2 RATIO
Triglycerides: 85 mg/dL (ref <150)
VLDL: 17 mg/dL (ref 0–40)

## 2019-03-18 LAB — ECHOCARDIOGRAM COMPLETE
Height: 66 in
Weight: 2666.68 oz

## 2019-03-18 LAB — SARS CORONAVIRUS 2 (TAT 6-24 HRS): SARS Coronavirus 2: NEGATIVE

## 2019-03-18 LAB — LACTIC ACID, PLASMA
Lactic Acid, Venous: 1.4 mmol/L (ref 0.5–1.9)
Lactic Acid, Venous: 1 mmol/L (ref 0.5–1.9)

## 2019-03-18 LAB — GLUCOSE, CAPILLARY
Glucose-Capillary: 243 mg/dL — ABNORMAL HIGH (ref 70–99)
Glucose-Capillary: 390 mg/dL — ABNORMAL HIGH (ref 70–99)

## 2019-03-18 MED ORDER — INSULIN ASPART 100 UNIT/ML ~~LOC~~ SOLN: 0.0000 [IU] | Freq: Every day | SUBCUTANEOUS | Status: DC

## 2019-03-18 MED ORDER — CLOPIDOGREL BISULFATE 75 MG PO TABS: 75.0000 mg | ORAL_TABLET | Freq: Every day | ORAL | 0 refills | Status: AC

## 2019-03-18 MED ORDER — INSULIN ASPART 100 UNIT/ML ~~LOC~~ SOLN
0.0000 [IU] | Freq: Three times a day (TID) | SUBCUTANEOUS | Status: DC
  Administered 2019-03-18: 9 [IU] via SUBCUTANEOUS
  Administered 2019-03-18: 08:00:00 3 [IU] via SUBCUTANEOUS
  Filled 2019-03-18 (×2): qty 1

## 2019-03-18 MED ORDER — ACETAMINOPHEN 160 MG/5ML PO SOLN
650.0000 mg | ORAL | Status: DC | PRN
  Filled 2019-03-18: qty 20.3

## 2019-03-18 MED ORDER — ATORVASTATIN CALCIUM 20 MG PO TABS: 40.0000 mg | ORAL_TABLET | Freq: Every day | ORAL | Status: DC

## 2019-03-18 MED ORDER — ATORVASTATIN CALCIUM 40 MG PO TABS: 40.0000 mg | ORAL_TABLET | Freq: Every day | ORAL | 0 refills | Status: DC

## 2019-03-18 MED ORDER — ASPIRIN EC 81 MG PO TBEC
81.0000 mg | DELAYED_RELEASE_TABLET | Freq: Every day | ORAL | Status: DC
  Administered 2019-03-18: 81 mg via ORAL
  Filled 2019-03-18: qty 1

## 2019-03-18 MED ORDER — CLOPIDOGREL BISULFATE 75 MG PO TABS
75.0000 mg | ORAL_TABLET | Freq: Every day | ORAL | Status: DC
  Administered 2019-03-18: 15:00:00 75 mg via ORAL
  Filled 2019-03-18: qty 1

## 2019-03-18 MED ORDER — STROKE: EARLY STAGES OF RECOVERY BOOK
Freq: Once | Status: AC
  Administered 2019-03-18: 04:00:00

## 2019-03-18 MED ORDER — ENOXAPARIN SODIUM 40 MG/0.4ML ~~LOC~~ SOLN
40.0000 mg | SUBCUTANEOUS | Status: DC
  Administered 2019-03-18: 04:00:00 40 mg via SUBCUTANEOUS
  Filled 2019-03-18: qty 0.4

## 2019-03-18 MED ORDER — ACETAMINOPHEN 650 MG RE SUPP: 650.0000 mg | RECTAL | Status: DC | PRN

## 2019-03-18 MED ORDER — ASPIRIN 81 MG PO TBEC: 81.0000 mg | DELAYED_RELEASE_TABLET | Freq: Every day | ORAL | 0 refills | Status: AC

## 2019-03-18 MED ORDER — ACETAMINOPHEN 325 MG PO TABS: 650.0000 mg | ORAL_TABLET | ORAL | Status: DC | PRN

## 2019-03-18 NOTE — Progress Notes (Signed)
0220 Pt arrived via stretcher from ED. NADN No c/o needs met. Will continue to monitor

## 2019-03-18 NOTE — Progress Notes (Signed)
   Consulted on patient regarding [place]. Per Neurology, [facility] not recommended until ophthalmology consult performed despite [id number] score as the risks associated with [facility] currently outweigh those of benefits. From Neurology documentation, patient should first be cleared by ophthalmology 2/2 multiple retinal hemorrhages. Per review of EMR, it appears that the patient might be discharged soon. Recommendation at this current time is that follow-up be arranged with patient's primary cardiologist to prevent gaps in care / treatment / handoff. Contacted internal medicine to also verify need for consult. They will reach out if needed.   Signed, Arvil Chaco, PA-C 03/18/2019, 4:39 PM Pager [phone]

## 2019-03-18 NOTE — Discharge Summary (Signed)
[name]    MR#:  [id number]  DATE OF BIRTH:  09-06-1955  DATE OF ADMISSION:  03/17/2019 ADMITTING PHYSICIAN: Lance Coon, MD  DATE OF DISCHARGE: 03/18/2019  PRIMARY CARE PHYSICIAN: [place] Pink, MD   ADMISSION DIAGNOSIS:  Dysarthria [R47.1] Cerebrovascular accident (CVA), unspecified mechanism ([place]) [I63.9]  DISCHARGE DIAGNOSIS:  Principal Problem:   Stroke [facility]) Active Problems:   CAD (coronary artery disease)   HLD (hyperlipidemia)   Diabetes mellitus type 2, uncontrolled ([place])   Asthma Acute CVA Atrial fibrillation Left basal ganglia ischemic CVA SECONDARY DIAGNOSIS:   Past Medical History:  Diagnosis Date  . Anxiety    a. with claustrophobia  . Arthritis    "right hand; maybe just starting in my left hand" (04/21/2012)  . Asthma   . CAD (coronary artery disease)    a. PCI [place] 2004. b. Angina - balloon angioplasty to LAD 04/21/12 (vessel too small for stent).  . Carotid stenosis    a. 11/2011 critical R, ~30% L by angiogram;  b. 04/2012 s/p R CEA.  . Cataract    left  . Chronic lower back pain   . Depression    a. occasionally takes st john's wart  . Diabetes mellitus type 2, uncontrolled ([place])   . Embolic stroke ([place])    a. 11/2011 MRI: mult small b/l infarcts c/w emboli;  b. 11/2011 MRA neck: high grade RICA stenosis;  c. 6/20013 TEE: EF 55-60% with mild LVH, severe MAC, < 1 cm calc mobile structure on the MV annulus, ? 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 Thought was RICA stenosis did not explain the b/l small strokes; concern for emboli from mobile MV struct;  d. 01/2012 Event Mon:  no AFib  . History of echocardiogram    Echo 12/16: mild LVH, EF 55-60%, no RWMA, Gr 2 DD, trivial AI, MAC, mild MR, mild LAE  . History of kidney stones   . History of nuclear stress test    a. Myoview 12/16: EF 46%, no ischemia, Intermediate Risk (low EF)  . HLD (hyperlipidemia)     a. takes Lipitor daily  . Hypertension, accelerated   . 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 visual changes  . Myocardial infarction [facility])    prior to 2013  . Peripheral neuropathy   . Refusal of blood transfusions as patient is Jehovah's Witness   . Stroke ([place]) 2015- dec   mini stroke  . Uncontrolled type 2 diabetes mellitus with circulatory disorder, with long-term current use of insulin ([place]) 05/10/2015     ADMITTING HISTORY [name]  is a 63 y.o. female who presents with chief complaint as above.  Patient presents the ED with a complaint of dysarthria.  She states that she woke up with slurred speech and it persisted throughout most of the day.  It has slowly resolved.  She has prior history of multiple strokes.  CT imaging tonight questions new stroke.  Hospitalist called for admission.Of note, patient states she does have a history of A. fib, as well as significant other vascular disease including heart disease.  She states that she recently started a new diet and has lost quite a bit of weight.  Given the fact she felt that maybe she could stop taking some of her medicines, and has not been on any of her medicines recently.  She is in process of transitioning to a new cardiologist.  She states that her prior cardiologist was with [facility] but that she received a letter stating she would need a new one  HOSPITAL COURSE:  And admitted to medical floor worked up with CT head, MRI brain.  MRI brain revealed CVA.  Patient received physical therapy occupational therapy during hospitalization.  Neurology evaluated the patient start patient on aspirin and Plavix for 3 weeks and then aspirin alone.  Patient started on statin medication.  Echocardiogram revealed EF of 60 to 65%.  Anticoagulation with Eliquis could not be started for atrial fibrillation because patient has history of retinal hemorrhages.  Needs ophthalmology evaluation and clearance as per neurology.  MRI brain  revealed left basal ganglia ischemic CVA.  Patient was worked up with carotid ultrasound which showed no hemodynamically significant stenosis.  CONSULTS OBTAINED:  Treatment Team:  Catarina Hartshorn, MD  DRUG ALLERGIES:   Allergies  Allergen Reactions  . Other Shortness Of Breath    No blood or blood products  . Penicillins Itching and Rash    From when younger Has patient had a PCN reaction causing immediate rash, facial/tongue/throat swelling, SOB or lightheadedness with hypotension: No Has patient had a PCN reaction causing severe rash involving mucus membranes or skin necrosis: No Has patient had a PCN reaction that required hospitalization: No Has patient had a PCN reaction occurring within the last 10 years: No If all of the above answers are "NO", then may proceed with Cephalosporin use.    . Toradol [Ketorolac Tromethamine] Other (See Comments)    Panic attack   . Isosorbide Nitrate Other (See Comments)    headaches  . Latex Rash    Rash on all points of contact (thick, heavy itching rash)  . Metformin And Related Diarrhea and Nausea And Vomiting    DISCHARGE MEDICATIONS:   Allergies as of 03/18/2019      Reactions   Other Shortness Of Breath   No blood or blood products   Penicillins Itching, Rash   From when younger Has patient had a PCN reaction causing immediate rash, facial/tongue/throat swelling, SOB or lightheadedness with hypotension: No Has patient had a PCN reaction causing severe rash involving mucus membranes or skin necrosis: No Has patient had a PCN reaction that required hospitalization: No Has patient had a PCN reaction occurring within the last 10 years: No If all of the above answers are "NO", then may proceed with Cephalosporin use.   Toradol [ketorolac Tromethamine] Other (See Comments)   Panic attack    Isosorbide Nitrate Other (See Comments)   headaches   Latex Rash   Rash on all points of contact (thick, heavy itching rash)   Metformin  And Related Diarrhea, Nausea And Vomiting      Medication List    TAKE these medications   aspirin 81 MG EC tablet Take 1 tablet (81 mg total) by mouth daily.   atorvastatin 40 MG tablet Commonly known as: LIPITOR Take 1 tablet (40 mg total) by mouth daily at 6 PM.   clopidogrel 75 MG tablet Commonly known as: PLAVIX Take 1 tablet (75 mg total) by mouth daily for 21 days.       Today  Patient seen and evaluated today No tingling or numbness No focal deficits Hemodynamically stable Tolerating diet okay VITAL SIGNS:  Blood pressure 94/79, pulse 78, temperature 97.7 F (36.5 C), temperature source Oral, resp. rate 16, height _0  (1.676 m), weight 75.6 kg, SpO2 99 %.  I/O:    Intake/Output  Summary (Last 24 hours) at 03/18/2019 1408 Last data filed at 03/18/2019 0400 Gross per 24 hour  Intake 0 ml  Output -  Net 0 ml    PHYSICAL EXAMINATION:  Physical Exam  GENERAL:  63 y.o.-year-old patient lying in the bed with no acute distress.  LUNGS: Normal breath sounds bilaterally, no wheezing, rales,rhonchi or crepitation. No use of accessory muscles of respiration.  CARDIOVASCULAR: S1, S2 normal. No murmurs, rubs, or gallops.  ABDOMEN: Soft, non-tender, non-distended. Bowel sounds present. No organomegaly or mass.  NEUROLOGIC: Moves all 4 extremities. PSYCHIATRIC: The patient is alert and oriented x 3.  SKIN: No obvious rash, lesion, or ulcer.   DATA REVIEW:   CBC Recent Labs  Lab 03/17/19 2016  WBC 6.9  HGB 13.7  HCT 40.8  PLT 374    Chemistries  Recent Labs  Lab 03/17/19 2016  NA 136  K 4.2  CL 96*  CO2 26  GLUCOSE 359*  BUN 19  CREATININE 0.96  CALCIUM 9.9  AST 24  ALT 19  ALKPHOS 82  BILITOT 0.7    Cardiac Enzymes No results for input(s): TROPONINI in the last 168 hours.  Microbiology Results  Results for orders placed or performed during the hospital encounter of 03/17/19  SARS CORONAVIRUS 2 (TAT 6-24 HRS) Nasopharyngeal Nasopharyngeal  Swab     Status: None   Collection Time: 03/18/19  1:06 AM   Specimen: Nasopharyngeal Swab  Result Value Ref Range Status   SARS Coronavirus 2 NEGATIVE NEGATIVE Final    Comment: (NOTE) SARS-CoV-2 target nucleic acids are NOT DETECTED. The SARS-CoV-2 RNA is generally detectable in upper and lower respiratory specimens during the acute phase of infection. Negative results do not preclude SARS-CoV-2 infection, do not rule out co-infections with other pathogens, and should not be used as the sole basis for treatment or other patient management decisions. Negative results must be combined with clinical observations, patient history, and epidemiological information. The expected result is Negative. Fact Sheet for Patients: SugarRoll.be Fact Sheet for Healthcare Providers: https://www.woods-mathews.com/ This test is not yet approved or cleared by the [place] FDA and  has been authorized for detection and/or diagnosis of SARS-CoV-2 by FDA under an Emergency Use Authorization (EUA). This EUA will remain  in effect (meaning this test can be used) for the duration of the COVID-19 declaration under Section 56 4(b)(1) of the Act, 21 U.S.C. section 360bbb-3(b)(1), unless the authorization is terminated or revoked sooner. Performed at [facility], [place] [address], [place], [place] [address]     RADIOLOGY:  Ct Head Wo Contrast  Result Date: 03/17/2019 CLINICAL DATA:  Slurred speech and right facial droop EXAM: CT HEAD WITHOUT CONTRAST TECHNIQUE: Contiguous axial images were obtained from the base of the skull through the vertex without intravenous contrast. COMPARISON:  May 24, 2014 FINDINGS: Brain: Age related volume loss is stable. There is no intracranial mass, hemorrhage, extra-axial fluid collection, or midline shift. There is evidence of a prior infarct in the right occipital lobe, not present on the 2015 study. There is evidence of a  prior infarct in the right mid thalamus. There is evidence of a prior small infarct in the posterior most aspect of the head of the caudate nucleus on the left, stable. There is small vessel disease in the centra semiovale bilaterally. Several areas of decreased attenuation in the left centrum semiovale potentially could represent recent and possibly acute white matter infarcts. Elsewhere brain parenchyma appears unremarkable. Vascular: There is no appreciable  hyperdense vessel. There is calcification in each carotid siphon region. Skull: The bony calvarium appears intact. Sinuses/Orbits: There is opacification in a posterior ethmoid air cell on the left. Other visualized paranasal sinuses are clear. Visualized orbits appear symmetric bilaterally. Other: Visualized mastoid air cells are clear. IMPRESSION: 1. Age related volume loss. Several prior infarcts including infarcts in the right occipital lobe, right thalamus, and posterior aspect of the left head of caudate nucleus. There is periventricular small vessel disease in the centra semiovale. Question potential recent and possibly acute infarct or infarcts in the left centrum semiovale somewhat inferiorly. 2.  No mass or hemorrhage evident. 3.  Multiple foci of arterial vascular calcification. 4.  Opacification of a posterior left ethmoid air cell. Electronically Signed   By: Lowella Grip III M.D.   On: 03/17/2019 21:05   Mr Angio Head Wo Contrast  Result Date: 03/18/2019 CLINICAL DATA:  Initial evaluation for acute stroke, speech difficulty. EXAM: MRI HEAD WITHOUT CONTRAST MRA HEAD WITHOUT CONTRAST TECHNIQUE: Multiplanar, multiecho pulse sequences of the brain and surrounding structures were obtained without intravenous contrast. Angiographic images of the head were obtained using MRA technique without contrast. COMPARISON:  Prior head CT from 03/17/2019. FINDINGS: MRI HEAD FINDINGS Brain: Generalized age-related cerebral atrophy. Moderate chronic  microvascular ischemic changes present within the periventricular and deep white matter both cerebral hemispheres. Multiple scattered remote lacunar infarcts present within the bilateral basal ganglia, thalami, and hemispheric cerebral white matter. Few small remote bilateral cerebellar infarcts. Patchy involvement of the pons noted. Chronic right PCA territory infarct involving the right temporal occipital region with associated encephalomalacia, gliosis, and mild hemosiderin staining. 17 mm acute ischemic infarct extending from the left lentiform nucleus into the left caudate, compatible with acute ischemic perforator infarct. No associated hemorrhage or mass effect. No other evidence for acute or subacute ischemia. Gray-white matter differentiation otherwise maintained. No evidence for acute intracranial hemorrhage. Additional small chronic microhemorrhage noted within the pons, small vessel related. No mass lesion, midline shift or mass effect. No hydrocephalus. No extra-axial fluid collection. Vascular: Major intracranial vascular flow voids are maintained. Skull and upper cervical spine: Craniocervical junction within normal limits. Bone marrow signal intensity normal. No scalp soft tissue abnormality. Sinuses/Orbits: Patient status post bilateral ocular lens replacement. Paranasal sinuses are clear. No mastoid effusion. Inner ear structures normal. Other: None. MRA HEAD FINDINGS ANTERIOR CIRCULATION: Petrous segments widely patent bilaterally. Moderate atherosclerotic change within the carotid siphons without high-grade stenosis. Left A1 patent. Right A1 hypoplastic and/or absent, accounting for the diminutive right ICA is compared to the left. Normal anterior communicating artery. Anterior cerebral arteries demonstrate scattered atheromatous irregularity but are patent to their distal aspects without stenosis. No M1 stenosis or occlusion. Left M1 bifurcates early. Distal MCA branches well perfused and  symmetric. Distal small vessel atheromatous irregularity. POSTERIOR CIRCULATION: Vertebral arteries grossly patent prior to the vertebrobasilar junction, otherwise not well assessed. Basilar patent to its distal aspect without stenosis. Superior cerebral arteries patent bilaterally. Both of the posterior cerebral arteries primarily supplied via the basilar. Moderate atherosclerotic change within the PCAs bilaterally without high-grade stenosis. No intracranial aneurysm. IMPRESSION: MRI HEAD IMPRESSION: 1. 17 mm acute ischemic nonhemorrhagic perforator type infarct involving the left basal ganglia. 2. Underlying age-related cerebral atrophy with moderate chronic small vessel ischemic disease and multiple remote ischemic infarcts as above. MRA HEAD IMPRESSION: 1. Negative intracranial MRA for large vessel occlusion. 2. Moderate atherosclerotic change throughout the intracranial circulation without hemodynamically significant or correctable stenosis. Electronically Signed   By:  Jeannine Boga M.D.   On: 03/18/2019 05:05   Mr Brain Wo Contrast  Result Date: 03/18/2019 CLINICAL DATA:  Initial evaluation for acute stroke, speech difficulty. EXAM: MRI HEAD WITHOUT CONTRAST MRA HEAD WITHOUT CONTRAST TECHNIQUE: Multiplanar, multiecho pulse sequences of the brain and surrounding structures were obtained without intravenous contrast. Angiographic images of the head were obtained using MRA technique without contrast. COMPARISON:  Prior head CT from 03/17/2019. FINDINGS: MRI HEAD FINDINGS Brain: Generalized age-related cerebral atrophy. Moderate chronic microvascular ischemic changes present within the periventricular and deep white matter both cerebral hemispheres. Multiple scattered remote lacunar infarcts present within the bilateral basal ganglia, thalami, and hemispheric cerebral white matter. Few small remote bilateral cerebellar infarcts. Patchy involvement of the pons noted. Chronic right PCA territory infarct  involving the right temporal occipital region with associated encephalomalacia, gliosis, and mild hemosiderin staining. 17 mm acute ischemic infarct extending from the left lentiform nucleus into the left caudate, compatible with acute ischemic perforator infarct. No associated hemorrhage or mass effect. No other evidence for acute or subacute ischemia. Gray-white matter differentiation otherwise maintained. No evidence for acute intracranial hemorrhage. Additional small chronic microhemorrhage noted within the pons, small vessel related. No mass lesion, midline shift or mass effect. No hydrocephalus. No extra-axial fluid collection. Vascular: Major intracranial vascular flow voids are maintained. Skull and upper cervical spine: Craniocervical junction within normal limits. Bone marrow signal intensity normal. No scalp soft tissue abnormality. Sinuses/Orbits: Patient status post bilateral ocular lens replacement. Paranasal sinuses are clear. No mastoid effusion. Inner ear structures normal. Other: None. MRA HEAD FINDINGS ANTERIOR CIRCULATION: Petrous segments widely patent bilaterally. Moderate atherosclerotic change within the carotid siphons without high-grade stenosis. Left A1 patent. Right A1 hypoplastic and/or absent, accounting for the diminutive right ICA is compared to the left. Normal anterior communicating artery. Anterior cerebral arteries demonstrate scattered atheromatous irregularity but are patent to their distal aspects without stenosis. No M1 stenosis or occlusion. Left M1 bifurcates early. Distal MCA branches well perfused and symmetric. Distal small vessel atheromatous irregularity. POSTERIOR CIRCULATION: Vertebral arteries grossly patent prior to the vertebrobasilar junction, otherwise not well assessed. Basilar patent to its distal aspect without stenosis. Superior cerebral arteries patent bilaterally. Both of the posterior cerebral arteries primarily supplied via the basilar. Moderate  atherosclerotic change within the PCAs bilaterally without high-grade stenosis. No intracranial aneurysm. IMPRESSION: MRI HEAD IMPRESSION: 1. 17 mm acute ischemic nonhemorrhagic perforator type infarct involving the left basal ganglia. 2. Underlying age-related cerebral atrophy with moderate chronic small vessel ischemic disease and multiple remote ischemic infarcts as above. MRA HEAD IMPRESSION: 1. Negative intracranial MRA for large vessel occlusion. 2. Moderate atherosclerotic change throughout the intracranial circulation without hemodynamically significant or correctable stenosis. Electronically Signed   By: Jeannine Boga M.D.   On: 03/18/2019 05:05   US Carotid Bilateral (at [facility] And [facility] Only)  Result Date: 03/18/2019 CLINICAL DATA:  Small acute left basal ganglia infarct EXAM: BILATERAL CAROTID DUPLEX ULTRASOUND TECHNIQUE: Pearline Cables scale imaging, color Doppler and duplex ultrasound were performed of bilateral carotid and vertebral arteries in the neck. COMPARISON:  05/25/2014 CTA neck FINDINGS: Criteria: Quantification of carotid stenosis is based on velocity parameters that correlate the residual internal carotid diameter with NASCET-based stenosis levels, using the diameter of the distal internal carotid lumen as the denominator for stenosis measurement. The following velocity measurements were obtained: RIGHT ICA: 82/23 cm/sec CCA: 41/28 cm/sec SYSTOLIC ICA/CCA RATIO:  0.9 ECA: 175 cm/sec LEFT ICA: 96/26 cm/sec CCA: 78/67 cm/sec SYSTOLIC ICA/CCA RATIO:  1.0 ECA:  168 cm/sec RIGHT CAROTID ARTERY: Moderate heterogeneous mixed echogenicity carotid bifurcation atherosclerosis. Despite this, no hemodynamically significant right ICA stenosis, velocity elevation, or turbulent flow. Degree of narrowing estimated at less than 50%. RIGHT VERTEBRAL ARTERY:  Antegrade LEFT CAROTID ARTERY: Similar moderate heterogeneous left carotid bifurcation atherosclerosis. No hemodynamically significant left ICA stenosis,  velocity elevation, turbulent flow. Degree of narrowing also less than 50%. LEFT VERTEBRAL ARTERY:  Antegrade IMPRESSION: Bilateral carotid atherosclerosis. No hemodynamically significant ICA stenosis. Degree of narrowing less than 50% bilaterally by ultrasound criteria. Patent antegrade vertebral flow bilaterally Electronically Signed   By: Jerilynn Mages.  Shick M.D.   On: 03/18/2019 09:40   Dg Chest Portable 1 View  Result Date: 03/18/2019 CLINICAL DATA:  Altered mental status. Slurred speech and right facial droop. EXAM: PORTABLE CHEST 1 VIEW COMPARISON:  Radiograph and CT 06/25/2015 FINDINGS: The cardiomediastinal contours are normal. Subsegmental atelectasis at the bases. Pulmonary vasculature is normal. No consolidation, pleural effusion, or pneumothorax. No acute osseous abnormalities are seen. Surgical clips in the right neck soft tissues. IMPRESSION: Subsegmental atelectasis at the bases. Otherwise no acute abnormality. Electronically Signed   By: Keith Rake M.D.   On: 03/18/2019 00:35    Follow up with PCP in 1 week.  Management plans discussed with the patient, family and they are in agreement.  CODE STATUS: Full code    Code Status Orders  (From admission, onward)         Start     Ordered   03/18/19 0236  Full code  Continuous     03/18/19 0235        Code Status History    Date Active Date Inactive Code Status Order ID Comments User Context   06/23/2015 1514 06/24/2015 1329 Full Code [id number]  Ansel Bong Inpatient   05/09/2012 1306 05/10/2012 1318 Full Code [id number]  Shona Simpson, RN Inpatient   12/12/2011 1558 12/14/2011 2126 Full Code [id number]  Karilyn Cota, RN Inpatient   Advance Care Planning Activity      TOTAL TIME TAKING CARE OF THIS PATIENT ON DAY OF DISCHARGE: more than 35 minutes.   Saundra Shelling M.D on 03/18/2019 at 2:08 PM  Between 7am to 6pm - Pager - [phone]  After 6pm go to [email or web address] - password [email or web address] [facility]  Office  [phone]  CC: Primary care physician; [place] Pink, MD  Note: This dictation was prepared with Dragon dictation along with smaller phrase technology. Any transcriptional errors that result from this process are unintentional.

## 2019-03-18 NOTE — ED Notes (Signed)
Patient is resting comfortably. 

## 2019-03-18 NOTE — ED Notes (Signed)
Report called and pt to MRI

## 2019-03-18 NOTE — Evaluation (Addendum)
Speech Language Pathology Evaluation Patient Details Name: [name] MRN: [id number] DOB: [birth year] Today's Date: 03/18/2019 Time: 7829-5621 SLP Time Calculation (min) (ACUTE ONLY): 50 min  Problem List:  Patient Active Problem List   Diagnosis Date Noted  . Stroke ([place])   . Refusal of blood transfusions as patient is Jehovah's Witness   . Peripheral neuropathy   . Myocardial infarction ([place])   . Migraine aura without headache   . HLD (hyperlipidemia)   . History of nuclear stress test   . History of kidney stones   . History of echocardiogram   . Diabetes mellitus type 2, uncontrolled ([place])   . Depression   . Cataract   . Chronic lower back pain   . Asthma   . Arthritis   . Anxiety   . Carotid stenosis, asymptomatic 06/23/2015  . Mitral valve annular calcification 05/11/2015  . Uncontrolled type 2 diabetes mellitus with circulatory disorder, with long-term current use of insulin ([place]) 05/10/2015  . Breast mass, left 01/10/2012  . Carotid stenosis 12/30/2011  . Embolic stroke ([place]) 30/86/5784  . Hypertension, accelerated 12/12/2011  . CAD (coronary artery disease) 12/12/2011   Past Medical History:  Past Medical History:  Diagnosis Date  . Anxiety    a. with claustrophobia  . Arthritis    "right hand; maybe just starting in my left hand" (04/21/2012)  . Asthma   . CAD (coronary artery disease)    a. PCI [place] 2004. b. Angina - balloon angioplasty to LAD 04/21/12 (vessel too small for stent).  . Carotid stenosis    a. 11/2011 critical R, ~30% L by angiogram;  b. 04/2012 s/p R CEA.  . Cataract    left  . Chronic lower back pain   . Depression    a. occasionally takes st john's wart  . Diabetes mellitus type 2, uncontrolled ([place])   . Embolic stroke ([place])    a. 11/2011 MRI: mult small b/l infarcts c/w emboli;  b. 11/2011 MRA neck: high grade RICA stenosis;  c. 6/20013 TEE: EF 55-60% with mild LVH, severe MAC, < 1 cm calc mobile structure on the MV annulus, ? Part  of MAC vs old healed endocarditis (only trivial MR). Thought was RICA stenosis did not explain the b/l small strokes; concern for emboli from mobile MV struct;  d. 01/2012 Event Mon:  no AFib  . History of echocardiogram    Echo 12/16: mild LVH, EF 55-60%, no RWMA, Gr 2 DD, trivial AI, MAC, mild MR, mild LAE  . History of kidney stones   . History of nuclear stress test    a. Myoview 12/16: EF 46%, no ischemia, Intermediate Risk (low EF)  . HLD (hyperlipidemia)    a. takes Lipitor daily  . Hypertension, accelerated   . 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 visual changes  . Myocardial infarction [facility])    prior to 2013  . Peripheral neuropathy   . Refusal of blood transfusions as patient is Jehovah's Witness   . Stroke ([place]) 2015- dec   mini stroke  . Uncontrolled type 2 diabetes mellitus with circulatory disorder, with long-term current use of insulin ([place]) 05/10/2015   Past Surgical History:  Past Surgical History:  Procedure Laterality Date  . ANKLE SURGERY  2005   after fall down stairs, left with screws and plate in place  . CARDIAC CATHETERIZATION  2004   WNL, no significant coronary disease  . CARDIAC CATHETERIZATION  12/19/11  . CAROTID ANGIOGRAM Bilateral 12/19/2011  Procedure: CAROTID ANGIOGRAM;  Surgeon: Serafina Mitchell, MD;  Location: [facility] CATH LAB;  Service: Cardiovascular;  Laterality: Bilateral;  . CAROTID ENDARTERECTOMY    . CARPAL TUNNEL RELEASE  ~ 2005   bilateral  . CATARACT EXTRACTION W/ INTRAOCULAR LENS IMPLANT  2012   right  . CATARACT EXTRACTION W/PHACO Left 10/2015   Dr Patrice Paradise at [facility]  . CORONARY ANGIOPLASTY  04/21/2012   95% stenosis mid LAD, vessel not large enough for stent, rec plavix/ASA for 1 mo  . CORONARY ANGIOPLASTY WITH STENT PLACEMENT  2002   x2  . ENDARTERECTOMY  05/09/2012   Procedure: ENDARTERECTOMY CAROTID;  Surgeon: Serafina Mitchell, MD;  Location: [facility] OR;  Service: Vascular;  Laterality: Right;  Right carotd endarterectomy with bovine patch  angioplasty  . ENDARTERECTOMY Left 06/23/2015   Procedure: ENDARTERECTOMY CAROTID;  Surgeon: Serafina Mitchell, MD;  Location: [place];  Service: Vascular;  Laterality: Left;  . EYE SURGERY Left 04/2015   laser surgery,blood vessels rupturing  . holter monitor  2013   WNL  . KIDNEY STONE SURGERY     several times  . LITHOTRIPSY    . PARS PLANA VITRECTOMY Left 04/2015   [facility] Dr Danella Sensing)  . PATCH ANGIOPLASTY Left 06/23/2015   Procedure: PATCH ANGIOPLASTY USING 1cm x 4cm BOVINE PERICARDIAL PATCH;  Surgeon: Serafina Mitchell, MD;  Location: [place];  Service: Vascular;  Laterality: Left;  . PERCUTANEOUS CORONARY STENT INTERVENTION (PCI-S) N/A 04/21/2012   Procedure: PERCUTANEOUS CORONARY STENT INTERVENTION (PCI-S);  Surgeon: Burnell Blanks, MD;  Location: [facility] CATH LAB;  Service: Cardiovascular;  Laterality: N/A;  . TEE WITHOUT CARDIOVERSION  12/14/2011   Procedure: TRANSESOPHAGEAL ECHOCARDIOGRAM (TEE);  Surgeon: Larey Dresser, MD;  Location: [facility] ENDOSCOPY;  Service: Cardiovascular;  Laterality: N/A;  . US ECHOCARDIOGRAPHY  2002   normal LV fxn, mild LVH, borderline L atrial size   HPI:  [name] is a 63 y.o. female presenting to hospital 03/17/19 with slurred speech, R facial droop, and mild HA; also reporting vision off.  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  PMH includes h/o CVA (embolic), anxiety with claustrophobia, CAD, chronic LBP, depression, DM, htn, MI, peripheral neuropathy, h/o L ankle surgery, carotid endarterectomy, and CTR. Mild Dyarthria persist but has improved per [name] report. [name] endorsed a high degree of stress at home; encouraged her to seek support, counseling to help manage stress.  Assessment / Plan / Recommendation Clinical Impression  [name] appeared to present w/ Mild Motor Speech deficits c/b decreased, precise Articulation of speech which minimally impacted speech intelligibility inconsistently. [name] was aware of  articulation errors and self-corrected. When she utilized articulation strategies to include Slowing Rate of speech, Over-articulating -- adding inflection and effort, and increasing Volume of speech, she fully improved any decreased articulation during conversation. [name] was able to ID and follow through w/ strategies; Education and Handouts on Dysarthria and strategies provided for take home. During the informal assessment at bedside, NO receptive or expressive language deficits (aphasia) noted; no cognitive deficits noted. [name] felt she could follow the recommendations and information provided while admitted then f/u w/ her PCP at discharge if she felt she needed further more formal assessment through Outpatient services. NSG/cm updated.     SLP Assessment  SLP Recommendation/Assessment: All further Speech Lanaguage Pathology  needs can be addressed in the next venue of care(if [name] desires) SLP Visit Diagnosis: Dysarthria and anarthria (R47.1)(mild)    Follow Up Recommendations  None(currently)    Frequency and Duration  n/a        SLP Evaluation Cognition  Overall Cognitive Status: Within Functional Limits for tasks assessed Arousal/Alertness: Awake/alert Orientation Level: Oriented X4       Comprehension  Auditory Comprehension Overall Auditory Comprehension: Appears within functional limits for tasks assessed Visual Recognition/Discrimination Discrimination: Not tested Reading Comprehension Reading Status: Not tested    Expression Expression Primary Mode of Expression: Verbal Verbal Expression Overall Verbal Expression: Appears within functional limits for tasks assessed Non-Verbal Means of Communication: Not applicable Written Expression Dominant Hand: Left Written Expression: Not tested   Oral / Motor  Oral Motor/Sensory Function Overall Oral Motor/Sensory Function: Mild impairment(slight but [name] denies) Facial ROM: Reduced right(slight decreased tone) Facial Symmetry:  Abnormal symmetry right(slight ) Facial Strength: Within Functional Limits Facial Sensation: Within Functional Limits Lingual ROM: Within Functional Limits Lingual Symmetry: Within Functional Limits Lingual Strength: Within Functional Limits Lingual Sensation: Within Functional Limits Velum: Within Functional Limits Mandible: Within Functional Limits Motor Speech Overall Motor Speech: Impaired Respiration: Within functional limits Phonation: Normal Resonance: Within functional limits Articulation: Impaired(slight-min) Level of Impairment: Conversation Intelligibility: Intelligible Motor Planning: Impaired Motor Speech Errors: Aware Interfering Components: (n/a) Effective Techniques: Slow rate;Increased vocal intensity;Over-articulate   GO                       Orinda Kenner, MS, CCC-SLP Watson,Katherine 03/18/2019, 4:19 PM

## 2019-03-18 NOTE — Evaluation (Signed)
Occupational Therapy Evaluation Patient Details Name: [name] MRN: [id number] DOB: 1956-03-22 Today's Date: 03/18/2019    History of Present Illness Pt is a 63 y.o. female presenting to hospital 03/17/19 with slurred speech, R facial droop, and mild HA; also reporting vision off.  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  PMH includes h/o CVA (embolic), anxiety with claustrophobia, CAD, chronic LBP, depression, DM, htn, MI, peripheral neuropathy, h/o L ankle surgery, carotid endarterectomy, and CTR.   Clinical Impression   Patient seen for OT evaluation this date.  Patient appears back to baseline level of care with basic self care tasks, no deficits in UE strength or coordination. Able to complete lower body dressing, toileting and grooming at the sink independently. She is able to complete handwriting samples of name and address with 100% legibility with her dominant left hand. Denies any numbness or tingling in UEs.  She does still have some dysarthria but reports this is the only change since admission.  Patient does not have any skilled OT needs at this time, please reconsult if needs arise.   Recommend patient have someone with her for the next couple days when transitioning home and have someone else feed the horses.      Follow Up Recommendations  No OT follow up    Equipment Recommendations       Recommendations for Other Services       Precautions / Restrictions Precautions Precautions: None Restrictions Weight Bearing Restrictions: No      Mobility Bed Mobility Overal bed mobility: Independent             General bed mobility comments: Semi-supine to/from sit without any noted difficulties  Transfers Overall transfer level: Independent Equipment used: None             General transfer comment: steady safe transfers noted    Balance Overall balance assessment: Needs  assistance Sitting-balance support: No upper extremity supported;Feet supported Sitting balance-[name] Scale: Normal Sitting balance - Comments: steady sitting reaching outside BOS   Standing balance support: During functional activity Standing balance-[name] Scale: Good Standing balance comment: pt appearing a little more cautious but no loss of balance noted with ambulation                           ADL either performed or assessed with clinical judgement   ADL Overall ADL's : Independent                                       General ADL Comments: Patient was independent prior to hospitalization with basic self care tasks, she does the cleaning at home, husband does the cooking and shopping.  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 She no longer drives secondary to visual deficits from past issues and lacks peripheral vision bilaterally, has had multiple laser surgeries in the past.  She was able to perform lower body dressing, toileting and grooming at the sink today without assist.     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     Perception     Praxis      Pertinent Vitals/Pain  Pain Assessment: No/denies pain Faces Pain Scale: No hurt Pain Intervention(s): Limited activity within patient's tolerance;Monitored during session;Repositioned     Hand Dominance Left   Extremity/Trunk Assessment Upper Extremity Assessment Upper Extremity Assessment: Overall WFL for tasks assessed   Lower Extremity Assessment Lower Extremity Assessment: Defer to PT evaluation RLE Deficits / Details: hip flexion 4+/5; knee flexion/extension 4+/5; DF 4+/5 LLE Deficits / Details: hip flexion 4+/5; knee flexion 4+/5; knee extension at least 3/5  (deferred MMT d/t L lower leg sensitivity chronic d/t h/o injury); DF 4+/5   Cervical / Trunk Assessment Cervical / Trunk Assessment: Normal   Communication Communication Communication: Other (comment)(Dysarthria noted)   Cognition Arousal/Alertness: Awake/alert Behavior During Therapy: WFL for tasks assessed/performed Overall Cognitive Status: Within Functional Limits for tasks assessed                                     General Comments       Exercises     Shoulder Instructions      Home Living Family/patient expects to be discharged to:: Private residence Living Arrangements: Spouse/significant other Available Help at Discharge: Family Type of Home: House Home Access: Stairs to enter;Level entry;Other (comment) Entrance Stairs-Number of Steps: through garage 1 step down to mud room then 1 step up Entrance Stairs-Rails: None Home Layout: Two level;Able to live on main level with bedroom/bathroom   Alternate Level Stairs-Rails: Can reach both Bathroom Shower/Tub: Tub/shower unit;Curtain   Bathroom Toilet: Standard Bathroom Accessibility: Yes   Home Equipment: None          Prior Functioning/Environment Level of Independence: Independent        Comments: [place] farm.  Active.  No h/o falls in past 6 months.        OT Problem List:        OT Treatment/Interventions:      OT Goals(Current goals can be found in the care plan section) Acute Rehab OT Goals Patient Stated Goal: to go home OT Goal Formulation: With patient Time For Goal Achievement: 03/28/19 Potential to Achieve Goals: Good  OT Frequency:     Barriers to D/C:            Co-evaluation              AM-PAC OT "6 Clicks" Daily Activity     Outcome Measure Help from another person eating meals?: None Help from another person taking care of personal grooming?: None Help from another person toileting, which includes using toliet, bedpan, or urinal?: None Help from  another person bathing (including washing, rinsing, drying)?: None Help from another person to put on and taking off regular upper body clothing?: None Help from another person to put on and taking off regular lower body clothing?: None 6 Click Score: 24   End of Session    Activity Tolerance: Patient tolerated treatment well Patient left: in bed;with nursing/sitter in room  OT Visit Diagnosis: Muscle weakness (generalized) (M62.81)                Time: 1740-8144 OT Time Calculation (min): 29 min Charges:  OT General Charges $OT Visit: 1 Visit OT Evaluation $OT Eval Low Complexity: 1 Low  [name], OTR/L, CLT  [name] 03/18/2019, 12:50 PM

## 2019-03-18 NOTE — Consult Note (Signed)
Referring Physician: Pyreddy    Chief Complaint: Dysarthria  HPI: [name] is an 63 y.o. female with a history of multiple medical problems including TIA/stroke and atrial fibrillation on ASA who on yesterday awakened with dysarthria.  Symptoms persisted throughout much of the day therefore patient presented for evaluation.  Initial NIHSS of 0.    Date last known well: Date: 03/17/2019 Time last known well: Time: 00:30 tPA Given: No: Outside time window  Past Medical History:  Diagnosis Date  . Anxiety    a. with claustrophobia  . Arthritis    "right hand; maybe just starting in my left hand" (04/21/2012)  . Asthma   . CAD (coronary artery disease)    a. PCI [place] 2004. b. Angina - balloon angioplasty to LAD 04/21/12 (vessel too small for stent).  . Carotid stenosis    a. 11/2011 critical R, ~30% L by angiogram;  b. 04/2012 s/p R CEA.  . Cataract    left  . Chronic lower back pain   . Depression    a. occasionally takes st john's wart  . Diabetes mellitus type 2, uncontrolled ([place])   . Embolic stroke ([place])    a. 11/2011 MRI: mult small b/l infarcts c/w emboli;  b. 11/2011 MRA neck: high grade RICA stenosis;  c. 6/20013 TEE: EF 55-60% with mild LVH, severe MAC, < 1 cm calc mobile structure on the MV annulus, ? 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 Thought was RICA stenosis did not explain the b/l small strokes; concern for emboli from mobile MV struct;  d. 01/2012 Event Mon:  no AFib  . History of echocardiogram    Echo 12/16: mild LVH, EF 55-60%, no RWMA, Gr 2 DD, trivial AI, MAC, mild MR, mild LAE  . History of kidney stones   . History of nuclear stress test    a. Myoview 12/16: EF 46%, no ischemia, Intermediate Risk (low EF)  . HLD (hyperlipidemia)    a. takes Lipitor daily  . Hypertension, accelerated   . 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 visual changes  . Myocardial infarction [facility])    prior to 2013  . Peripheral neuropathy   . Refusal of blood  transfusions as patient is Jehovah's Witness   . Stroke ([place]) 2015- dec   mini stroke  . Uncontrolled type 2 diabetes mellitus with circulatory disorder, with long-term current use of insulin ([place]) 05/10/2015    Past Surgical History:  Procedure Laterality Date  . ANKLE SURGERY  2005   after fall down stairs, left with screws and plate in place  . CARDIAC CATHETERIZATION  2004   WNL, no significant coronary disease  . CARDIAC CATHETERIZATION  12/19/11  . CAROTID ANGIOGRAM Bilateral 12/19/2011   Procedure: CAROTID ANGIOGRAM;  Surgeon: Serafina Mitchell, MD;  Location: [facility] CATH LAB;  Service: Cardiovascular;  Laterality: Bilateral;  . CAROTID ENDARTERECTOMY    . CARPAL TUNNEL RELEASE  ~ 2005   bilateral  . CATARACT EXTRACTION W/ INTRAOCULAR LENS IMPLANT  2012   right  . CATARACT EXTRACTION W/PHACO Left 10/2015   Dr Patrice Paradise at [facility]  . CORONARY ANGIOPLASTY  04/21/2012   95% stenosis mid LAD, vessel not large enough for stent, rec plavix/ASA for 1 mo  . CORONARY ANGIOPLASTY WITH STENT PLACEMENT  2002   x2  . ENDARTERECTOMY  05/09/2012   Procedure: ENDARTERECTOMY CAROTID;  Surgeon: Serafina Mitchell, MD;  Location: [facility];  Service: Vascular;  Laterality: Right;  Right carotd endarterectomy with bovine patch angioplasty  . ENDARTERECTOMY Left 06/23/2015   Procedure: ENDARTERECTOMY CAROTID;  Surgeon: Serafina Mitchell, MD;  Location: [facility];  Service: Vascular;  Laterality: Left;  . EYE SURGERY Left 04/2015   laser surgery,blood vessels rupturing  . holter monitor  2013   WNL  . KIDNEY STONE SURGERY     several times  . LITHOTRIPSY    . PARS PLANA VITRECTOMY Left 04/2015   [facility] Dr Danella Sensing)  . PATCH ANGIOPLASTY Left 06/23/2015   Procedure: PATCH ANGIOPLASTY USING 1cm x 4cm BOVINE PERICARDIAL PATCH;  Surgeon: Serafina Mitchell, MD;  Location: [facility];  Service: Vascular;  Laterality: Left;  . PERCUTANEOUS CORONARY STENT INTERVENTION (PCI-S) N/A 04/21/2012   Procedure: PERCUTANEOUS CORONARY STENT  INTERVENTION (PCI-S);  Surgeon: Burnell Blanks, MD;  Location: [facility] CATH LAB;  Service: Cardiovascular;  Laterality: N/A;  . TEE WITHOUT CARDIOVERSION  12/14/2011   Procedure: TRANSESOPHAGEAL ECHOCARDIOGRAM (TEE);  Surgeon: Larey Dresser, MD;  Location: [facility] ENDOSCOPY;  Service: Cardiovascular;  Laterality: N/A;  . US ECHOCARDIOGRAPHY  2002   normal LV fxn, mild LVH, borderline L atrial size    Family History  Problem Relation Age of Onset  . Heart disease Mother        valve problems  . Stroke Mother   . Hypertension Mother   . Heart disease Sister        valve problems  . Heart disease Brother   . Diabetes Father   . Hypertension Father   . AAA (abdominal aortic aneurysm) Father   . Cancer Neg Hx    Social History:  reports that she quit smoking about 43 years ago. Her smoking use included cigarettes. She smoked 0.00 packs per day for 3.00 years. She has never used smokeless tobacco. She reports that she does not drink alcohol or use drugs.  Allergies:  Allergies  Allergen Reactions  . Other Shortness Of Breath    No blood or blood products  . Penicillins Itching and Rash    From when younger Has patient had a PCN reaction causing immediate rash, facial/tongue/throat swelling, SOB or lightheadedness with hypotension: No Has patient had a PCN reaction causing severe rash involving mucus membranes or skin necrosis: No Has patient had a PCN reaction that required hospitalization: No Has patient had a PCN reaction occurring within the last 10 years: No If all of the above answers are "NO", then may proceed with Cephalosporin use.    . Toradol [Ketorolac Tromethamine] Other (See Comments)    Panic attack   . Isosorbide Nitrate Other (See Comments)    headaches  . Latex Rash    Rash on all points of contact (thick, heavy itching rash)  . Metformin And Related Diarrhea and Nausea And Vomiting    Medications:  I have reviewed the patient's current medications. Prior to  Admission:  No medications prior to admission.   Scheduled: . enoxaparin (LOVENOX) injection  40 mg Subcutaneous Q24H  . insulin aspart  0-5 Units Subcutaneous QHS  . insulin aspart  0-9 Units Subcutaneous TID WC    ROS: History obtained from the patient  General ROS: negative for - chills, fatigue, fever, night sweats, weight gain or weight loss Psychological ROS: negative for - behavioral disorder, hallucinations, memory difficulties, mood swings or suicidal ideation Ophthalmic ROS: visual problems ENT ROS: negative for - epistaxis, nasal discharge, oral lesions, sore throat, tinnitus or vertigo Allergy and Immunology ROS: negative for - hives or itchy/watery  eyes Hematological and Lymphatic ROS: negative for - bleeding problems, bruising or swollen lymph nodes Endocrine ROS: negative for - galactorrhea, hair pattern changes, polydipsia/polyuria or temperature intolerance Respiratory ROS: negative for - cough, hemoptysis, shortness of breath or wheezing Cardiovascular ROS: negative for - chest pain, dyspnea on exertion, edema or irregular heartbeat Gastrointestinal ROS: negative for - abdominal pain, diarrhea, hematemesis, nausea/vomiting or stool incontinence Genito-Urinary ROS: negative for - dysuria, hematuria, incontinence or urinary frequency/urgency Musculoskeletal ROS: LLE pain Neurological ROS: as noted in HPI Dermatological ROS: negative for rash and skin lesion changes  Physical Examination: Blood pressure 94/79, pulse 78, temperature 97.7 F (36.5 C), temperature source Oral, resp. rate 16, height _0  (1.676 m), weight 75.6 kg, SpO2 99 %.  HEENT-  Normocephalic, no lesions, without obvious abnormality.  Normal external eye and conjunctiva.  Normal TM's bilaterally.  Normal auditory canals and external ears. Normal external nose, mucus membranes and septum.  Normal pharynx. Cardiovascular- S1, S2 normal, pulses palpable throughout   Lungs- chest clear, no wheezing,  rales, normal symmetric air entry Abdomen- soft, non-tender; bowel sounds normal; no masses,  no organomegaly Extremities- no edema Lymph-no adenopathy palpable Musculoskeletal-no joint tenderness, deformity or swelling Skin-warm and dry, no hyperpigmentation, vitiligo, or suspicious lesions  Neurological Examination   Mental Status: Alert, oriented, thought content appropriate.  Speech fluent without evidence of aphasia.  Able to follow 3 step commands without difficulty. Cranial Nerves: II: Discs flat bilaterally; Visual fields grossly normal III,IV, VI: ptosis not present, extra-ocular motions intact bilaterally V,VII: right facial droop, facial light touch sensation normal bilaterally VIII: hearing normal bilaterally IX,X: gag reflex present XI: bilateral shoulder shrug XII: midline tongue extension Motor: Right : Upper extremity   5/5 with 5-/5 hand grip  Left:     Upper extremity   5/5  Lower extremity   5/5                 Lower extremity   5/5 Tone and bulk:normal tone throughout; no atrophy noted Sensory: Pinprick and light touch intact throughout, bilaterally Deep Tendon Reflexes: Symmetric throughout Plantars: Right: mute   Left: mute Cerebellar: Normal finger-to-nose and normal heel-to-shin testing bilaterally Gait: normal gait and station    Laboratory Studies:  Basic Metabolic Panel: Recent Labs  Lab 03/17/19 2016  NA 136  K 4.2  CL 96*  CO2 26  GLUCOSE 359*  BUN 19  CREATININE 0.96  CALCIUM 9.9    Liver Function Tests: Recent Labs  Lab 03/17/19 2016  AST 24  ALT 19  ALKPHOS 82  BILITOT 0.7  PROT 7.3  ALBUMIN 4.3   No results for input(s): LIPASE, AMYLASE in the last 168 hours. No results for input(s): AMMONIA in the last 168 hours.  CBC: Recent Labs  Lab 03/17/19 2016  WBC 6.9  NEUTROABS 2.9  HGB 13.7  HCT 40.8  MCV 84.3  PLT 374    Cardiac Enzymes: No results for input(s): CKTOTAL, CKMB, CKMBINDEX, TROPONINI in the last 168  hours.  BNP: Invalid input(s): POCBNP  CBG: Recent Labs  Lab 03/18/19 0731  GLUCAP 243*    Microbiology: Results for orders placed or performed during the hospital encounter of 06/16/15  Surgical pcr screen     Status: None   Collection Time: 06/16/15  9:59 AM   Specimen: Nasal Mucosa; Nasal Swab  Result Value Ref Range Status   MRSA, PCR NEGATIVE NEGATIVE Final   Staphylococcus aureus NEGATIVE NEGATIVE Final    Comment:  The Xpert SA Assay (FDA approved for NASAL specimens in patients over 55 years of age), is one component of a comprehensive surveillance program.  Test performance has been validated by [facility] for patients greater than or equal to 59 year old. It is not intended to diagnose infection nor to guide or monitor treatment.     Coagulation Studies: Recent Labs    03/17/19 August 28, 2014  LABPROT 12.8  INR 1.0    Urinalysis: No results for input(s): COLORURINE, LABSPEC, PHURINE, GLUCOSEU, HGBUR, BILIRUBINUR, KETONESUR, PROTEINUR, UROBILINOGEN, NITRITE, LEUKOCYTESUR in the last 168 hours.  Invalid input(s): APPERANCEUR  Lipid Panel:    Component Value Date/Time   CHOL 213 (H) 03/18/2019 0238   CHOL 220 (H) 05/24/2014 0038   TRIG 85 03/18/2019 0238   TRIG 164 05/24/2014 0038   HDL 51 03/18/2019 0238   HDL 51 05/24/2014 0038   CHOLHDL 4.2 03/18/2019 0238   VLDL 17 03/18/2019 0238   VLDL 33 05/24/2014 0038   LDLCALC 145 (H) 03/18/2019 0238   LDLCALC 136 (H) 05/24/2014 0038    HgbA1C:  Lab Results  Component Value Date   HGBA1C 8.7 11/08/2015    Urine Drug Screen:      Component Value Date/Time   LABOPIA NONE DETECTED 12/12/2011 0936   COCAINSCRNUR NONE DETECTED 12/12/2011 0936   LABBENZ NONE DETECTED 12/12/2011 0936   AMPHETMU NONE DETECTED 12/12/2011 0936   THCU NONE DETECTED 12/12/2011 0936   LABBARB NONE DETECTED 12/12/2011 0936    Alcohol Level: No results for input(s): ETH in the last 168 hours.  Other results: EKG: sinus  tachycardia at 103 bpm.  Imaging: Ct Head Wo Contrast  Result Date: 03/17/2019 CLINICAL DATA:  Slurred speech and right facial droop EXAM: CT HEAD WITHOUT CONTRAST TECHNIQUE: Contiguous axial images were obtained from the base of the skull through the vertex without intravenous contrast. COMPARISON:  May 24, 2014 FINDINGS: Brain: Age related volume loss is stable. There is no intracranial mass, hemorrhage, extra-axial fluid collection, or midline shift. There is evidence of a prior infarct in the right occipital lobe, not present on the 2013/08/28 study. There is evidence of a prior infarct in the right mid thalamus. There is evidence of a prior small infarct in the posterior most aspect of the head of the caudate nucleus on the left, stable. There is small vessel disease in the centra semiovale bilaterally. Several areas of decreased attenuation in the left centrum semiovale potentially could represent recent and possibly acute white matter infarcts. Elsewhere brain parenchyma appears unremarkable. Vascular: There is no appreciable hyperdense vessel. There is calcification in each carotid siphon region. Skull: The bony calvarium appears intact. Sinuses/Orbits: There is opacification in a posterior ethmoid air cell on the left. Other visualized paranasal sinuses are clear. Visualized orbits appear symmetric bilaterally. Other: Visualized mastoid air cells are clear. IMPRESSION: 1. Age related volume loss. Several prior infarcts including infarcts in the right occipital lobe, right thalamus, and posterior aspect of the left head of caudate nucleus. There is periventricular small vessel disease in the centra semiovale. Question potential recent and possibly acute infarct or infarcts in the left centrum semiovale somewhat inferiorly. 2.  No mass or hemorrhage evident. 3.  Multiple foci of arterial vascular calcification. 4.  Opacification of a posterior left ethmoid air cell. Electronically Signed   By: Lowella Grip III M.D.   On: 03/17/2019 21:05   Mr Angio Head Wo Contrast  Result Date: 03/18/2019 CLINICAL DATA:  Initial evaluation for acute  stroke, speech difficulty. EXAM: MRI HEAD WITHOUT CONTRAST MRA HEAD WITHOUT CONTRAST TECHNIQUE: Multiplanar, multiecho pulse sequences of the brain and surrounding structures were obtained without intravenous contrast. Angiographic images of the head were obtained using MRA technique without contrast. COMPARISON:  Prior head CT from 03/17/2019. FINDINGS: MRI HEAD FINDINGS Brain: Generalized age-related cerebral atrophy. Moderate chronic microvascular ischemic changes present within the periventricular and deep white matter both cerebral hemispheres. Multiple scattered remote lacunar infarcts present within the bilateral basal ganglia, thalami, and hemispheric cerebral white matter. Few small remote bilateral cerebellar infarcts. Patchy involvement of the pons noted. Chronic right PCA territory infarct involving the right temporal occipital region with associated encephalomalacia, gliosis, and mild hemosiderin staining. 17 mm acute ischemic infarct extending from the left lentiform nucleus into the left caudate, compatible with acute ischemic perforator infarct. No associated hemorrhage or mass effect. No other evidence for acute or subacute ischemia. Gray-white matter differentiation otherwise maintained. No evidence for acute intracranial hemorrhage. Additional small chronic microhemorrhage noted within the pons, small vessel related. No mass lesion, midline shift or mass effect. No hydrocephalus. No extra-axial fluid collection. Vascular: Major intracranial vascular flow voids are maintained. Skull and upper cervical spine: Craniocervical junction within normal limits. Bone marrow signal intensity normal. No scalp soft tissue abnormality. Sinuses/Orbits: Patient status post bilateral ocular lens replacement. Paranasal sinuses are clear. No mastoid effusion. Inner ear  structures normal. Other: None. MRA HEAD FINDINGS ANTERIOR CIRCULATION: Petrous segments widely patent bilaterally. Moderate atherosclerotic change within the carotid siphons without high-grade stenosis. Left A1 patent. Right A1 hypoplastic and/or absent, accounting for the diminutive right ICA is compared to the left. Normal anterior communicating artery. Anterior cerebral arteries demonstrate scattered atheromatous irregularity but are patent to their distal aspects without stenosis. No M1 stenosis or occlusion. Left M1 bifurcates early. Distal MCA branches well perfused and symmetric. Distal small vessel atheromatous irregularity. POSTERIOR CIRCULATION: Vertebral arteries grossly patent prior to the vertebrobasilar junction, otherwise not well assessed. Basilar patent to its distal aspect without stenosis. Superior cerebral arteries patent bilaterally. Both of the posterior cerebral arteries primarily supplied via the basilar. Moderate atherosclerotic change within the PCAs bilaterally without high-grade stenosis. No intracranial aneurysm. IMPRESSION: MRI HEAD IMPRESSION: 1. 17 mm acute ischemic nonhemorrhagic perforator type infarct involving the left basal ganglia. 2. Underlying age-related cerebral atrophy with moderate chronic small vessel ischemic disease and multiple remote ischemic infarcts as above. MRA HEAD IMPRESSION: 1. Negative intracranial MRA for large vessel occlusion. 2. Moderate atherosclerotic change throughout the intracranial circulation without hemodynamically significant or correctable stenosis. Electronically Signed   By: Jeannine Boga M.D.   On: 03/18/2019 05:05   Mr Brain Wo Contrast  Result Date: 03/18/2019 CLINICAL DATA:  Initial evaluation for acute stroke, speech difficulty. EXAM: MRI HEAD WITHOUT CONTRAST MRA HEAD WITHOUT CONTRAST TECHNIQUE: Multiplanar, multiecho pulse sequences of the brain and surrounding structures were obtained without intravenous contrast.  Angiographic images of the head were obtained using MRA technique without contrast. COMPARISON:  Prior head CT from 03/17/2019. FINDINGS: MRI HEAD FINDINGS Brain: Generalized age-related cerebral atrophy. Moderate chronic microvascular ischemic changes present within the periventricular and deep white matter both cerebral hemispheres. Multiple scattered remote lacunar infarcts present within the bilateral basal ganglia, thalami, and hemispheric cerebral white matter. Few small remote bilateral cerebellar infarcts. Patchy involvement of the pons noted. Chronic right PCA territory infarct involving the right temporal occipital region with associated encephalomalacia, gliosis, and mild hemosiderin staining. 17 mm acute ischemic infarct extending from the left lentiform nucleus into the  left caudate, compatible with acute ischemic perforator infarct. No associated hemorrhage or mass effect. No other evidence for acute or subacute ischemia. Gray-white matter differentiation otherwise maintained. No evidence for acute intracranial hemorrhage. Additional small chronic microhemorrhage noted within the pons, small vessel related. No mass lesion, midline shift or mass effect. No hydrocephalus. No extra-axial fluid collection. Vascular: Major intracranial vascular flow voids are maintained. Skull and upper cervical spine: Craniocervical junction within normal limits. Bone marrow signal intensity normal. No scalp soft tissue abnormality. Sinuses/Orbits: Patient status post bilateral ocular lens replacement. Paranasal sinuses are clear. No mastoid effusion. Inner ear structures normal. Other: None. MRA HEAD FINDINGS ANTERIOR CIRCULATION: Petrous segments widely patent bilaterally. Moderate atherosclerotic change within the carotid siphons without high-grade stenosis. Left A1 patent. Right A1 hypoplastic and/or absent, accounting for the diminutive right ICA is compared to the left. Normal anterior communicating artery. Anterior  cerebral arteries demonstrate scattered atheromatous irregularity but are patent to their distal aspects without stenosis. No M1 stenosis or occlusion. Left M1 bifurcates early. Distal MCA branches well perfused and symmetric. Distal small vessel atheromatous irregularity. POSTERIOR CIRCULATION: Vertebral arteries grossly patent prior to the vertebrobasilar junction, otherwise not well assessed. Basilar patent to its distal aspect without stenosis. Superior cerebral arteries patent bilaterally. Both of the posterior cerebral arteries primarily supplied via the basilar. Moderate atherosclerotic change within the PCAs bilaterally without high-grade stenosis. No intracranial aneurysm. IMPRESSION: MRI HEAD IMPRESSION: 1. 17 mm acute ischemic nonhemorrhagic perforator type infarct involving the left basal ganglia. 2. Underlying age-related cerebral atrophy with moderate chronic small vessel ischemic disease and multiple remote ischemic infarcts as above. MRA HEAD IMPRESSION: 1. Negative intracranial MRA for large vessel occlusion. 2. Moderate atherosclerotic change throughout the intracranial circulation without hemodynamically significant or correctable stenosis. Electronically Signed   By: Jeannine Boga M.D.   On: 03/18/2019 05:05   US Carotid Bilateral (at [facility] And [facility] Only)  Result Date: 03/18/2019 CLINICAL DATA:  Small acute left basal ganglia infarct EXAM: BILATERAL CAROTID DUPLEX ULTRASOUND TECHNIQUE: Pearline Cables scale imaging, color Doppler and duplex ultrasound were performed of bilateral carotid and vertebral arteries in the neck. COMPARISON:  05/25/2014 CTA neck FINDINGS: Criteria: Quantification of carotid stenosis is based on velocity parameters that correlate the residual internal carotid diameter with NASCET-based stenosis levels, using the diameter of the distal internal carotid lumen as the denominator for stenosis measurement. The following velocity measurements were obtained: RIGHT ICA: 82/23 cm/sec  CCA: 09/38 cm/sec SYSTOLIC ICA/CCA RATIO:  0.9 ECA: 175 cm/sec LEFT ICA: 96/26 cm/sec CCA: 18/29 cm/sec SYSTOLIC ICA/CCA RATIO:  1.0 ECA: 168 cm/sec RIGHT CAROTID ARTERY: Moderate heterogeneous mixed echogenicity carotid bifurcation atherosclerosis. Despite this, no hemodynamically significant right ICA stenosis, velocity elevation, or turbulent flow. Degree of narrowing estimated at less than 50%. RIGHT VERTEBRAL ARTERY:  Antegrade LEFT CAROTID ARTERY: Similar moderate heterogeneous left carotid bifurcation atherosclerosis. No hemodynamically significant left ICA stenosis, velocity elevation, turbulent flow. Degree of narrowing also less than 50%. LEFT VERTEBRAL ARTERY:  Antegrade IMPRESSION: Bilateral carotid atherosclerosis. No hemodynamically significant ICA stenosis. Degree of narrowing less than 50% bilaterally by ultrasound criteria. Patent antegrade vertebral flow bilaterally Electronically Signed   By: Jerilynn Mages.  Shick M.D.   On: 03/18/2019 09:40   Dg Chest Portable 1 View  Result Date: 03/18/2019 CLINICAL DATA:  Altered mental status. Slurred speech and right facial droop. EXAM: PORTABLE CHEST 1 VIEW COMPARISON:  Radiograph and CT 06/25/2015 FINDINGS: The cardiomediastinal contours are normal. Subsegmental atelectasis at the bases. Pulmonary vasculature is normal. No consolidation,  pleural effusion, or pneumothorax. No acute osseous abnormalities are seen. Surgical clips in the right neck soft tissues. IMPRESSION: Subsegmental atelectasis at the bases. Otherwise no acute abnormality. Electronically Signed   By: Keith Rake M.D.   On: 03/18/2019 00:35    Assessment: 63 y.o. female with a history of stroke/TIA and atrial fibrillation on ASA who presents with dysarthria that has improved.  MRI of the brain reviewed and shows an acute left BG infarct.  Although patient with a history of atrial fibrillation, location concerning for small vessel etiology.  MRA of the brain was unremarkable.  Carotid  dopplers show no evidence of hemodynamically significant stenosis.  Echocardiogram pending.  A1c pending, LDL 145.  Stroke Risk Factors - atrial fibrillation, diabetes mellitus, hyperlipidemia and hypertension  Plan: 1. HgbA1c pending 2. PT consult, OT consult, Speech consult 3. Echocardiogram pending 4. Prophylactic therapy-Patient with a history of stroke and atrial fibrillation.  Although this would traditionally lead to start of anticoagulation, location moe typical for small vessel disease etiology and patient with multiple small vessel risk factors.  Patient also with recent history of multiple retinal hemorrhages.  Would not like to start anticoagulation until cleared by ophthalmology.  Patient to be evaluated on an outpatient basis.  In the meantime Dual antiplatelet therapy with ASA 48m and Plavix 740mfor three weeks with change to ASA 8160mlone as monotherapy after that time. 5. Statin for lipid management with target LDL<70. 6. Telemetry monitoring 7. Frequent neuro checks   LesAlexis GoodellD Neurology [phone]/2020, 11:11 AM

## 2019-03-18 NOTE — Progress Notes (Signed)
Inpatient Diabetes Program Recommendations  AACE/ADA: New Consensus Statement on Inpatient Glycemic Control (2015)  Target Ranges:  Prepandial:   less than 140 mg/dL      Peak postprandial:   less than 180 mg/dL (1-2 hours)      Critically ill patients:  140 - 180 mg/dL   Lab Results  Component Value Date   GLUCAP 390 (H) 03/18/2019   HGBA1C 8.7 11/08/2015    Review of Glycemic Control Results for [name], [name] (MRN [id number]) as of 03/18/2019 12:10  Ref. Range 03/18/2019 07:31 03/18/2019 11:41  Glucose-Capillary Latest Ref Range: 70 - 99 mg/dL 243 (H) 390 (H)   Diabetes history: DM2 Outpatient Diabetes medications: Novolin N 30 units qhs Current orders for Inpatient glycemic control: Novolog sensitive correction tid + hs 0-5  Inpatient Diabetes Program Recommendations:   Spoke with patient regarding if she has been taking insulin . States she has been taking insulin but unsure how much she is taking. Records list Novolin N 30 units q hs.  -Start Lantus 15 units qd (50% home basal insulin dose)  Thank you, Bethena Roys E. Aidan Caloca, RN, MSN, CDE  Diabetes Coordinator Inpatient Glycemic Control Team Team Pager [phone] (8am-5pm) 03/18/2019 12:24 PM

## 2019-03-18 NOTE — Progress Notes (Signed)
Advanced care plan. Purpose of the Encounter: CODE STATUS Parties in Attendance: Patient Patient's Decision Capacity: Good Subjective/Patient's story: [name]  is a 63 y.o. female who presents with chief complaint as above.  Patient presents the ED with a complaint of dysarthria.  She states that she woke up with slurred speech and it persisted throughout most of the day.  It has slowly resolved.  She has prior history of multiple strokes.  CT imaging tonight questions new stroke. Hospitalist called for admission.Of note, patient states she does have a history of A. fib, as well as significant other vascular disease including heart disease.  She states that she recently started a new diet and has lost quite a bit of weight.  Given the fact she felt that maybe she could stop taking some of her medicines, and has not been on any of her medicines recently.  She is in process of transitioning to a new cardiologist.  She states that her prior cardiologist was with [facility] but that she received a letter stating she would need a new one. Objective/Medical story Patient has cva on mri brain. Needs work up with carotid USD and echocardiogram and neurology evaluation.  Needs physical therapy Goals of care determination:  Advance care directives goals of care treatment plan discussed with patient in detail She is full resuscitation CODE STATUS: Full code Time spent discussing advanced care planning: 16 minutes

## 2019-03-18 NOTE — ED Notes (Signed)
ED TO INPATIENT HANDOFF REPORT  ED Nurse Name and Phone #:  Dorothyann Gibbs [phone]  S Name/Age/Gender [name] 63 y.o. female Room/Bed: ED03A/ED03A  Code Status   Code Status: Prior  Home/SNF/Other Home Patient oriented to: self, place, time and situation Is this baseline? Yes   Triage Complete: Triage complete  Chief Complaint poss TIA  Triage Note Patient to ER for c/o slurred speech and right facial droop since this morning. Patient states she went to bed at approx 0100 last night (this am), woke up this am with symptoms present. Patient reports history of CVA. Patient reports speech still sounds slurred to her, but perhaps has improved.    Allergies Allergies  Allergen Reactions  . Other Shortness Of Breath    No blood or blood products  . Penicillins Itching and Rash    From when younger Has patient had a PCN reaction causing immediate rash, facial/tongue/throat swelling, SOB or lightheadedness with hypotension: No Has patient had a PCN reaction causing severe rash involving mucus membranes or skin necrosis: No Has patient had a PCN reaction that required hospitalization: No Has patient had a PCN reaction occurring within the last 10 years: No If all of the above answers are "NO", then may proceed with Cephalosporin use.    . Toradol [Ketorolac Tromethamine] Other (See Comments)    Panic attack   . Isosorbide Nitrate Other (See Comments)    headaches  . Latex Rash    Rash on all points of contact (thick, heavy itching rash)  . Metformin And Related Diarrhea and Nausea And Vomiting    Level of Care/Admitting Diagnosis ED Disposition    ED Disposition Condition [facility] Area: [place] [100120]  Level of Care: Med-Surg [16]  Covid Evaluation: Asymptomatic Screening Protocol (No Symptoms)  Diagnosis: Stroke [facility]) [[id number]]  Admitting Physician: Lance Coon [[id number]]  Attending Physician: Lance Coon [phone]  Estimated length of stay: past midnight tomorrow  Certification:: I certify this patient will need inpatient services for at least 2 midnights  PT Class (Do Not Modify): Inpatient [101]  PT Acc Code (Do Not Modify): Private [1]       B Medical/Surgery History Past Medical History:  Diagnosis Date  . Anxiety    a. with claustrophobia  . Arthritis    "right hand; maybe just starting in my left hand" (04/21/2012)  . Asthma   . CAD (coronary artery disease)    a. PCI [place] 2004. b. Angina - balloon angioplasty to LAD 04/21/12 (vessel too small for stent).  . Carotid stenosis    a. 11/2011 critical R, ~30% L by angiogram;  b. 04/2012 s/p R CEA.  . Cataract    left  . Chronic lower back pain   . Depression    a. occasionally takes st john's wart  . Diabetes mellitus type 2, uncontrolled ([place])   . Embolic stroke ([place])    a. 11/2011 MRI: mult small b/l infarcts c/w emboli;  b. 11/2011 MRA neck: high grade RICA stenosis;  c. 6/20013 TEE: EF 55-60% with mild LVH, severe MAC, < 1 cm calc mobile structure on the MV annulus, ? 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 Thought was RICA stenosis did not explain the b/l small strokes; concern for emboli from mobile MV struct;  d. 01/2012 Event Mon:  no AFib  . History of echocardiogram    Echo 12/16: mild LVH, EF 55-60%, no RWMA, Gr 2 DD, trivial  AI, MAC, mild MR, mild LAE  . History of kidney stones   . History of nuclear stress test    a. Myoview 12/16: EF 46%, no ischemia, Intermediate Risk (low EF)  . HLD (hyperlipidemia)    a. takes Lipitor daily  . Hypertension, accelerated   . 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 visual changes  . Myocardial infarction [facility])    prior to 2013  . Peripheral neuropathy   . Refusal of blood transfusions as patient is Jehovah's Witness   . Stroke ([place]) 2015- dec   mini stroke  . Uncontrolled type 2 diabetes mellitus with circulatory disorder, with long-term current use of insulin ([place])  05/10/2015   Past Surgical History:  Procedure Laterality Date  . ANKLE SURGERY  2005   after fall down stairs, left with screws and plate in place  . CARDIAC CATHETERIZATION  2004   WNL, no significant coronary disease  . CARDIAC CATHETERIZATION  12/19/11  . CAROTID ANGIOGRAM Bilateral 12/19/2011   Procedure: CAROTID ANGIOGRAM;  Surgeon: Serafina Mitchell, MD;  Location: [facility] CATH LAB;  Service: Cardiovascular;  Laterality: Bilateral;  . CAROTID ENDARTERECTOMY    . CARPAL TUNNEL RELEASE  ~ 2005   bilateral  . CATARACT EXTRACTION W/ INTRAOCULAR LENS IMPLANT  2012   right  . CATARACT EXTRACTION W/PHACO Left 10/2015   Dr Patrice Paradise at [facility]  . CORONARY ANGIOPLASTY  04/21/2012   95% stenosis mid LAD, vessel not large enough for stent, rec plavix/ASA for 1 mo  . CORONARY ANGIOPLASTY WITH STENT PLACEMENT  2002   x2  . ENDARTERECTOMY  05/09/2012   Procedure: ENDARTERECTOMY CAROTID;  Surgeon: Serafina Mitchell, MD;  Location: [facility] OR;  Service: Vascular;  Laterality: Right;  Right carotd endarterectomy with bovine patch angioplasty  . ENDARTERECTOMY Left 06/23/2015   Procedure: ENDARTERECTOMY CAROTID;  Surgeon: Serafina Mitchell, MD;  Location: [place];  Service: Vascular;  Laterality: Left;  . EYE SURGERY Left 04/2015   laser surgery,blood vessels rupturing  . holter monitor  2013   WNL  . KIDNEY STONE SURGERY     several times  . LITHOTRIPSY    . PARS PLANA VITRECTOMY Left 04/2015   [facility] Dr Danella Sensing)  . PATCH ANGIOPLASTY Left 06/23/2015   Procedure: PATCH ANGIOPLASTY USING 1cm x 4cm BOVINE PERICARDIAL PATCH;  Surgeon: Serafina Mitchell, MD;  Location: [place];  Service: Vascular;  Laterality: Left;  . PERCUTANEOUS CORONARY STENT INTERVENTION (PCI-S) N/A 04/21/2012   Procedure: PERCUTANEOUS CORONARY STENT INTERVENTION (PCI-S);  Surgeon: Burnell Blanks, MD;  Location: [facility] CATH LAB;  Service: Cardiovascular;  Laterality: N/A;  . TEE WITHOUT CARDIOVERSION  12/14/2011   Procedure: TRANSESOPHAGEAL  ECHOCARDIOGRAM (TEE);  Surgeon: Larey Dresser, MD;  Location: [place];  Service: Cardiovascular;  Laterality: N/A;  . US ECHOCARDIOGRAPHY  2002   normal LV fxn, mild LVH, borderline L atrial size     A IV Location/Drains/Wounds Patient Lines/Drains/Airways Status   Active Line/Drains/Airways    Name:   Placement date:   Placement time:   Site:   Days:   Peripheral IV 03/17/19 Right Forearm   03/17/19    2235    Forearm   1   Incision (Closed) 06/23/15 Neck Left   06/23/15    0733     1364          Intake/Output Last 24 hours No intake or output data in the 24 hours ending 03/18/19 0128  Labs/Imaging Results for orders placed or  performed during the hospital encounter of 03/17/19 (from the past 48 hour(s))  Protime-INR     Status: None   Collection Time: 03/17/19  8:16 PM  Result Value Ref Range   Prothrombin Time 12.8 11.4 - 15.2 seconds   INR 1.0 0.8 - 1.2    Comment: (NOTE) INR goal varies based on device and disease states. Performed at [facility], [address] [place], [place] [address]   APTT     Status: None   Collection Time: 03/17/19  8:16 PM  Result Value Ref Range   aPTT 30 24 - 36 seconds    Comment: Performed at [facility], [address] [place], [place] [address]  CBC     Status: None   Collection Time: 03/17/19  8:16 PM  Result Value Ref Range   WBC 6.9 4.0 - 10.5 K/uL   RBC 4.84 3.87 - 5.11 MIL/uL   Hemoglobin 13.7 12.0 - 15.0 g/dL   HCT 40.8 36.0 - 46.0 %   MCV 84.3 80.0 - 100.0 fL   MCH 28.3 26.0 - 34.0 pg   MCHC 33.6 30.0 - 36.0 g/dL   RDW 12.3 11.5 - 15.5 %   Platelets 374 150 - 400 K/uL   nRBC 0.0 0.0 - 0.2 %    Comment: Performed at [facility], [address] [place], [place] [address]  Differential     Status: None   Collection Time: 03/17/19  8:16 PM  Result Value Ref Range   Neutrophils Relative % 43 %   Neutro Abs 2.9 1.7 - 7.7 K/uL   Lymphocytes Relative 45 %   Lymphs Abs 3.1 0.7 - 4.0  K/uL   Monocytes Relative 7 %   Monocytes Absolute 0.5 0.1 - 1.0 K/uL   Eosinophils Relative 4 %   Eosinophils Absolute 0.3 0.0 - 0.5 K/uL   Basophils Relative 1 %   Basophils Absolute 0.1 0.0 - 0.1 K/uL   Immature Granulocytes 0 %   Abs Immature Granulocytes 0.01 0.00 - 0.07 K/uL    Comment: Performed at [facility], [address] [place], [place] [address]  Comprehensive metabolic panel     Status: Abnormal   Collection Time: 03/17/19  8:16 PM  Result Value Ref Range   Sodium 136 135 - 145 mmol/L   Potassium 4.2 3.5 - 5.1 mmol/L   Chloride 96 (L) 98 - 111 mmol/L   CO2 26 22 - 32 mmol/L   Glucose, Bld 359 (H) 70 - 99 mg/dL   BUN 19 8 - 23 mg/dL   Creatinine, Ser 0.96 0.44 - 1.00 mg/dL   Calcium 9.9 8.9 - 10.3 mg/dL   Total Protein 7.3 6.5 - 8.1 g/dL   Albumin 4.3 3.5 - 5.0 g/dL   AST 24 15 - 41 U/L   ALT 19 0 - 44 U/L   Alkaline Phosphatase 82 38 - 126 U/L   Total Bilirubin 0.7 0.3 - 1.2 mg/dL   GFR calc non Af Amer >60 >60 mL/min   GFR calc Af Amer >60 >60 mL/min   Anion gap 14 5 - 15    Comment: Performed at [facility], [address] [place], [place] [address]   Ct Head Wo Contrast  Result Date: 03/17/2019 CLINICAL DATA:  Slurred speech and right facial droop EXAM: CT HEAD WITHOUT CONTRAST TECHNIQUE: Contiguous axial images were obtained from the base of the skull through the vertex without intravenous contrast. COMPARISON:  May 24, 2014  FINDINGS: Brain: Age related volume loss is stable. There is no intracranial mass, hemorrhage, extra-axial fluid collection, or midline shift. There is evidence of a prior infarct in the right occipital lobe, not present on the 2015 study. There is evidence of a prior infarct in the right mid thalamus. There is evidence of a prior small infarct in the posterior most aspect of the head of the caudate nucleus on the left, stable. There is small vessel disease in the centra semiovale bilaterally. Several areas of  decreased attenuation in the left centrum semiovale potentially could represent recent and possibly acute white matter infarcts. Elsewhere brain parenchyma appears unremarkable. Vascular: There is no appreciable hyperdense vessel. There is calcification in each carotid siphon region. Skull: The bony calvarium appears intact. Sinuses/Orbits: There is opacification in a posterior ethmoid air cell on the left. Other visualized paranasal sinuses are clear. Visualized orbits appear symmetric bilaterally. Other: Visualized mastoid air cells are clear. IMPRESSION: 1. Age related volume loss. Several prior infarcts including infarcts in the right occipital lobe, right thalamus, and posterior aspect of the left head of caudate nucleus. There is periventricular small vessel disease in the centra semiovale. Question potential recent and possibly acute infarct or infarcts in the left centrum semiovale somewhat inferiorly. 2.  No mass or hemorrhage evident. 3.  Multiple foci of arterial vascular calcification. 4.  Opacification of a posterior left ethmoid air cell. Electronically Signed   By: Lowella Grip III M.D.   On: 03/17/2019 21:05   Dg Chest Portable 1 View  Result Date: 03/18/2019 CLINICAL DATA:  Altered mental status. Slurred speech and right facial droop. EXAM: PORTABLE CHEST 1 VIEW COMPARISON:  Radiograph and CT 06/25/2015 FINDINGS: The cardiomediastinal contours are normal. Subsegmental atelectasis at the bases. Pulmonary vasculature is normal. No consolidation, pleural effusion, or pneumothorax. No acute osseous abnormalities are seen. Surgical clips in the right neck soft tissues. IMPRESSION: Subsegmental atelectasis at the bases. Otherwise no acute abnormality. Electronically Signed   By: Keith Rake M.D.   On: 03/18/2019 00:35    Pending Labs Unresulted Labs (From admission, onward)    Start     Ordered   03/17/19 2337  Lactic acid, plasma  Now then every 2 hours,   STAT     03/17/19 2337    03/17/19 2337  Urinalysis, Complete w Microscopic  Once,   STAT     03/17/19 2337   03/17/19 2230  SARS CORONAVIRUS 2 (TAT 6-24 HRS) Nasopharyngeal Nasopharyngeal Swab  (Asymptomatic/Tier 2 Patients Labs)  Once,   STAT    Question Answer Comment  Is this test for diagnosis or screening Screening   Symptomatic for COVID-19 as defined by CDC No   Hospitalized for COVID-19 No   Admitted to ICU for COVID-19 No   Previously tested for COVID-19 No   Resident in a congregate (group) care setting No   Employed in healthcare setting No   Pregnant No      03/17/19 2229   Signed and Held  HIV antibody (Routine Testing)  Once,   R     Signed and Held   Signed and Held  Hemoglobin A1c  Tomorrow morning,   R     Signed and Held   Signed and Held  Lipid panel  Tomorrow morning,   R    Comments: Fasting    Signed and Held   Signed and Held  Hemoglobin A1c  Once,   R    Comments: To assess prior glycemic control  Signed and Held   Signed and Held  CBC  (enoxaparin (LOVENOX)    CrCl >/= 30 ml/min)  Once,   R    Comments: Baseline for enoxaparin therapy IF NOT ALREADY DRAWN.  Notify MD if PLT < 100 K.    Signed and Held   Signed and Held  Creatinine, serum  (enoxaparin (LOVENOX)    CrCl >/= 30 ml/min)  Once,   R    Comments: Baseline for enoxaparin therapy IF NOT ALREADY DRAWN.    Signed and Held   Signed and Held  Creatinine, serum  (enoxaparin (LOVENOX)    CrCl >/= 30 ml/min)  Weekly,   R    Comments: while on enoxaparin therapy    Signed and Held          Vitals/Pain Today's Vitals   03/17/19 2330 03/18/19 0000 03/18/19 0030 03/18/19 0100  BP: (!) 141/89 116/79 (!) 152/95 (!) 128/92  Pulse:  77 81 82  Resp: 16 18 (!) 22 16  Temp:      TempSrc:      SpO2:  90% 92% 97%  PainSc:        Isolation Precautions No active isolations  Medications Medications  sodium chloride 0.9 % bolus 1,000 mL (has no administration in time range)  aspirin chewable tablet 324 mg (324 mg Oral  Given 03/17/19 2232)  sodium chloride 0.9 % bolus 1,000 mL (0 mLs Intravenous Stopped 03/18/19 0053)    Mobility walks Low fall risk   Focused Assessments n/.a   R Recommendations: See Admitting Provider Note  Report given to:   Additional Notes: n/a

## 2019-03-18 NOTE — TOC Transition Note (Addendum)
Transition of Care [facility]) - CM/SW Discharge Note   Patient Details  Name: [name] MRN: [id number] Date of Birth: Nov 04, 1955  [facility] CM/SW Contact:  Candie Chroman, LCSW Phone Number: 03/18/2019, 2:25 PM   Clinical Narrative: CSW met with patient, introduced role, and explained that discharge planning would be discussed. Patient confirmed her PCP is Dr. Kary Kos. She has not seen him since last fall. She has no insurance and is unemployed. New medications prescribed this admission are Aspirin, Lipitor, and Plavix. Patient said if she is unable to afford her medications on her own, her son will help her pay for them. CSW confirmed with RNCM that these are the generic version. Both Plavix and Lipitor are $15 at [facility] (They were sent to [employer]) for a 30-day supply. Patient's husband will pick her up today. No further concerns. CSW signing off.   Final next level of care: Home/Self Care Barriers to Discharge: Barriers Resolved   Patient Goals and CMS Choice     Choice offered to / list presented to : NA  Discharge Placement                Patient to be transferred to facility by: Husband will pick her up.   Patient and family notified of of transfer: 03/18/19  Discharge Plan and Services                DME Arranged: N/A         HH Arranged: NA          Social Determinants of Health (SDOH) Interventions     Readmission Risk Interventions No flowsheet data found.

## 2019-03-18 NOTE — Care Plan (Signed)
Pt will return to baseline. VS remain stable and free from falls and injury throughout shift.

## 2019-03-18 NOTE — Plan of Care (Signed)
Pt is d/ced home.  Will start on aspirin and plavix.  Will f/u with neurology and opthomologist b/c of previous retinal hemorrhages.  Pt's NIH = 0 despite being positive for stroke.  Pt's husband is present to take her home. IV removed.  D/c instructions will be reviewed.

## 2019-03-18 NOTE — Progress Notes (Signed)
*  PRELIMINARY RESULTS* Echocardiogram 2D Echocardiogram has been performed.  [name] 03/18/2019, 11:53 AM

## 2019-03-18 NOTE — Evaluation (Signed)
Physical Therapy Evaluation Patient Details Name: [name] MRN: [id number] DOB: [birth year] Today's Date: 03/18/2019   History of Present Illness  Pt is a 63 y.o. female presenting to hospital 03/17/19 with slurred speech, R facial droop, and mild HA; also reporting vision off.  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  PMH includes h/o CVA (embolic), anxiety with claustrophobia, CAD, chronic LBP, depression, DM, htn, MI, peripheral neuropathy, h/o L ankle surgery, carotid endarterectomy, and CTR.  Clinical Impression  Prior to hospital admission, pt was independent.  [place]; lives on main level of home and can access home with level entry or steps.  Initial mild R facial droop noted upon therapist entering room and when pt smiled very mild R facial droop noted--pt reporting this was her "normal face" and did not see a difference.  Currently pt is independent with bed mobility; independent with transfers; and SBA with ambulation in room.  Pt appearing more cautious and reported generalized B LE weakness with walking from not doing much in the hospital but both improved with increased distance ambulating.  No loss of balance noted during session's activities.  Pt would benefit from skilled PT to address noted impairments and functional limitations during hospital stay (see below for any additional details).  Upon hospital discharge, anticipate no further PT needs.    Follow Up Recommendations No PT follow up    Equipment Recommendations  None recommended by PT    Recommendations for Other Services       Precautions / Restrictions Precautions Precautions: Fall Restrictions Weight Bearing Restrictions: No      Mobility  Bed Mobility Overal bed mobility: Independent             General bed mobility comments: Semi-supine to/from sit without any noted difficulties  Transfers Overall transfer  level: Independent Equipment used: None             General transfer comment: steady safe transfers noted  Ambulation/Gait Ambulation/Gait assistance: Supervision Gait Distance (Feet): 180 Feet(in pt's room) Assistive device: None   Gait velocity: mildly decreased   General Gait Details: pt appearing more cautious initially and reporting B LE's feeling "weak" d/t not doing much in the hospital but improved confidence and feeling of improved overall LE strength noted with increased distance ambulating in room  Stairs            Wheelchair Mobility    Modified Rankin (Stroke Patients Only)       Balance Overall balance assessment: Needs assistance Sitting-balance support: No upper extremity supported;Feet supported Sitting balance-[name] Scale: Normal Sitting balance - Comments: steady sitting reaching outside BOS   Standing balance support: During functional activity Standing balance-[name] Scale: Good Standing balance comment: pt appearing a little more cautious but no loss of balance noted with ambulation                             Pertinent Vitals/Pain Pain Assessment: Faces Pain Intervention(s): Limited activity within patient's tolerance;Monitored during session;Repositioned  HR 92 bpm at rest and increased up to 112 bpm with activity.    Home Living Family/patient expects to be discharged to:: Private residence Living Arrangements: Spouse/significant other Available Help at Discharge: Family Type of Home: House Home Access: Stairs to enter;Level entry;Other (comment)(can enter no stairs if needed) Entrance Stairs-Rails: None Entrance Stairs-Number of Steps: through garage 1 step down  to mud room then 1 step up Home Layout: Two level;Able to live on main level with bedroom/bathroom Home Equipment: None      Prior Function Level of Independence: Independent         Comments: [place].  Active.  No h/o falls in past 6 months.      Hand Dominance        Extremity/Trunk Assessment   Upper Extremity Assessment Upper Extremity Assessment: Overall WFL for tasks assessed(at least 4+/5 B shoulder flexion and elbow flexion/extension; strong B hand grip strength; no pronator drift noted; no coordination deficts noted; pt denies any tingling/numbness B UE's)    Lower Extremity Assessment Lower Extremity Assessment: RLE deficits/detail;LLE deficits/detail(intact B LE coordination (heel to shin), tone, and proprioception.  Baseline decreased sensation L lower leg d/t h/o injury falling down stairs and s/p L ankle surgery.) RLE Deficits / Details: hip flexion 4+/5; knee flexion/extension 4+/5; DF 4+/5 LLE Deficits / Details: hip flexion 4+/5; knee flexion 4+/5; knee extension at least 3/5 (deferred MMT d/t L lower leg sensitivity chronic d/t h/o injury); DF 4+/5    Cervical / Trunk Assessment Cervical / Trunk Assessment: Normal  Communication   Communication: Other (comment)(Dysarthria noted)  Cognition Arousal/Alertness: Awake/alert Behavior During Therapy: WFL for tasks assessed/performed Overall Cognitive Status: Within Functional Limits for tasks assessed                                        General Comments   Nursing cleared pt for participation in physical therapy.  Pt agreeable to PT session.    Exercises     Assessment/Plan    PT Assessment Patient needs continued PT services  PT Problem List Decreased strength;Decreased balance;Decreased mobility       PT Treatment Interventions DME instruction;Gait training;Stair training;Functional mobility training;Therapeutic activities;Therapeutic exercise;Balance training;Patient/family education    PT Goals (Current goals can be found in the Care Plan section)  Acute Rehab PT Goals Patient Stated Goal: to go home PT Goal Formulation: With patient Time For Goal Achievement: 04/01/19 Potential to Achieve Goals: Good    Frequency  7X/week   Barriers to discharge        Co-evaluation               AM-PAC PT "6 Clicks" Mobility  Outcome Measure Help needed turning from your back to your side while in a flat bed without using bedrails?: None Help needed moving from lying on your back to sitting on the side of a flat bed without using bedrails?: None Help needed moving to and from a bed to a chair (including a wheelchair)?: None Help needed standing up from a chair using your arms (e.g., wheelchair or bedside chair)?: None Help needed to walk in hospital room?: A Little Help needed climbing 3-5 steps with a railing? : A Little 6 Click Score: 22    End of Session   Activity Tolerance: Patient tolerated treatment well Patient left: in bed;with call bell/phone within reach;Other (comment)(no bed alarm set d/t pt fall risk score of 3) Nurse Communication: Mobility status PT Visit Diagnosis: Unsteadiness on feet (R26.81);Muscle weakness (generalized) (M62.81)    Time: 5361-4431 PT Time Calculation (min) (ACUTE ONLY): 25 min   Charges:   PT Evaluation $PT Eval Low Complexity: 1 Low         Corretta Munce, PT 03/18/19, 11:44 AM [phone]

## 2019-03-19 LAB — HEMOGLOBIN A1C
Hgb A1c MFr Bld: 12.8 % — ABNORMAL HIGH (ref 4.8–5.6)
Mean Plasma Glucose: 321 mg/dL

## 2019-03-19 LAB — HIV ANTIBODY (ROUTINE TESTING W REFLEX): HIV Screen 4th Generation wRfx: NONREACTIVE

## 2019-07-17 ENCOUNTER — Other Ambulatory Visit
Admission: RE | Admit: 2019-07-17 | Discharge: 2019-07-17 | Disposition: A | Discharge status: Home / Self Care | Payer: Medicaid Other | Source: Ambulatory Visit | Attending: Cardiology | Admitting: Cardiology

## 2019-07-17 ENCOUNTER — Other Ambulatory Visit: Payer: Self-pay

## 2019-07-17 DIAGNOSIS — Z01812 Encounter for preprocedural laboratory examination: Secondary | ICD-10-CM | POA: Insufficient documentation

## 2019-07-17 DIAGNOSIS — Z20822 Contact with and (suspected) exposure to covid-19: Secondary | ICD-10-CM | POA: Insufficient documentation

## 2019-07-18 LAB — SARS CORONAVIRUS 2 (TAT 6-24 HRS): SARS Coronavirus 2: NEGATIVE

## 2019-07-21 MED ORDER — VANCOMYCIN HCL IN DEXTROSE 1-5 GM/200ML-% IV SOLN
1000.0000 mg | INTRAVENOUS | Status: DC
  Filled 2019-07-21: qty 200

## 2019-07-22 ENCOUNTER — Encounter
Admission: RE | Disposition: A | Discharge status: Home / Self Care | Payer: Self-pay | Source: Home / Self Care | Attending: Cardiology

## 2019-07-22 ENCOUNTER — Other Ambulatory Visit: Payer: Self-pay

## 2019-07-22 ENCOUNTER — Encounter: Payer: Self-pay | Admitting: Cardiology

## 2019-07-22 ENCOUNTER — Ambulatory Visit
Admission: RE | Admit: 2019-07-22 | Discharge: 2019-07-22 | Disposition: A | Discharge status: Home / Self Care | Payer: Medicaid Other | Attending: Cardiology | Admitting: Cardiology

## 2019-07-22 DIAGNOSIS — Z87891 Personal history of nicotine dependence: Secondary | ICD-10-CM | POA: Insufficient documentation

## 2019-07-22 DIAGNOSIS — E1140 Type 2 diabetes mellitus with diabetic neuropathy, unspecified: Secondary | ICD-10-CM | POA: Insufficient documentation

## 2019-07-22 DIAGNOSIS — Z7902 Long term (current) use of antithrombotics/antiplatelets: Secondary | ICD-10-CM | POA: Insufficient documentation

## 2019-07-22 DIAGNOSIS — Z955 Presence of coronary angioplasty implant and graft: Secondary | ICD-10-CM | POA: Insufficient documentation

## 2019-07-22 DIAGNOSIS — D689 Coagulation defect, unspecified: Secondary | ICD-10-CM | POA: Insufficient documentation

## 2019-07-22 DIAGNOSIS — I509 Heart failure, unspecified: Secondary | ICD-10-CM | POA: Insufficient documentation

## 2019-07-22 DIAGNOSIS — I4891 Unspecified atrial fibrillation: Secondary | ICD-10-CM | POA: Insufficient documentation

## 2019-07-22 DIAGNOSIS — Z79899 Other long term (current) drug therapy: Secondary | ICD-10-CM | POA: Insufficient documentation

## 2019-07-22 DIAGNOSIS — I252 Old myocardial infarction: Secondary | ICD-10-CM | POA: Insufficient documentation

## 2019-07-22 DIAGNOSIS — Z794 Long term (current) use of insulin: Secondary | ICD-10-CM | POA: Insufficient documentation

## 2019-07-22 DIAGNOSIS — G459 Transient cerebral ischemic attack, unspecified: Secondary | ICD-10-CM | POA: Insufficient documentation

## 2019-07-22 DIAGNOSIS — F419 Anxiety disorder, unspecified: Secondary | ICD-10-CM | POA: Insufficient documentation

## 2019-07-22 DIAGNOSIS — E785 Hyperlipidemia, unspecified: Secondary | ICD-10-CM | POA: Insufficient documentation

## 2019-07-22 DIAGNOSIS — I110 Hypertensive heart disease with heart failure: Secondary | ICD-10-CM | POA: Insufficient documentation

## 2019-07-22 DIAGNOSIS — R002 Palpitations: Secondary | ICD-10-CM | POA: Insufficient documentation

## 2019-07-22 DIAGNOSIS — I2510 Atherosclerotic heart disease of native coronary artery without angina pectoris: Secondary | ICD-10-CM | POA: Insufficient documentation

## 2019-07-22 DIAGNOSIS — M1990 Unspecified osteoarthritis, unspecified site: Secondary | ICD-10-CM | POA: Insufficient documentation

## 2019-07-22 DIAGNOSIS — I6523 Occlusion and stenosis of bilateral carotid arteries: Secondary | ICD-10-CM | POA: Insufficient documentation

## 2019-07-22 DIAGNOSIS — Z7982 Long term (current) use of aspirin: Secondary | ICD-10-CM | POA: Insufficient documentation

## 2019-07-22 DIAGNOSIS — Z8673 Personal history of transient ischemic attack (TIA), and cerebral infarction without residual deficits: Secondary | ICD-10-CM | POA: Insufficient documentation

## 2019-07-22 DIAGNOSIS — F329 Major depressive disorder, single episode, unspecified: Secondary | ICD-10-CM | POA: Insufficient documentation

## 2019-07-22 HISTORY — PX: LOOP RECORDER INSERTION: EP1214

## 2019-07-22 HISTORY — DX: Cardiac arrhythmia, unspecified: I49.9

## 2019-07-22 SURGERY — LOOP RECORDER INSERTION
Anesthesia: LOCAL

## 2019-07-22 MED ORDER — SODIUM CHLORIDE 0.9 % IV SOLN
80.0000 mg | INTRAVENOUS | Status: DC
  Filled 2019-07-22: qty 2

## 2019-07-22 MED ORDER — LIDOCAINE-EPINEPHRINE (PF) 1 %-1:200000 IJ SOLN
INTRAMUSCULAR | Status: DC | PRN
  Administered 2019-07-22: 20 mL

## 2019-07-22 MED ORDER — SODIUM CHLORIDE 0.9 % IV SOLN: INTRAVENOUS | Status: DC

## 2019-07-22 MED ORDER — LIDOCAINE-EPINEPHRINE (PF) 1 %-1:200000 IJ SOLN
INTRAMUSCULAR | Status: AC
  Filled 2019-07-22: qty 10

## 2019-07-22 SURGICAL SUPPLY — 2 items
LOOP REVEAL LINQSYS (Prosthesis & Implant Heart) ×2 IMPLANT
PACK LOOP INSERTION (CUSTOM PROCEDURE TRAY) ×2 IMPLANT

## 2019-07-22 NOTE — Discharge Instructions (Signed)
Implantable Loop Recorder Placement, Care After This sheet gives you information about how to care for yourself after your procedure. Your health care provider may also give you more specific instructions. If you have problems or questions, contact your health care provider. What can I expect after the procedure? After the procedure, it is common to have:  Soreness or discomfort near the incision.  Some swelling or bruising near the incision. Follow these instructions at home: Incision care   Follow instructions from your health care provider about how to take care of your incision. Make sure you: ? Wash your hands with soap and water before you change your bandage (dressing). If soap and water are not available, use hand sanitizer. ? Change your dressing as told by your health care provider. ? Keep your dressing dry. ? Leave stitches (sutures), skin glue, or adhesive strips in place. These skin closures may need to stay in place for 2 weeks or longer. If adhesive strip edges start to loosen and curl up, you may trim the loose edges. Do not remove adhesive strips completely unless your health care provider tells you to do that.  Check your incision area every day for signs of infection. Check for: ? Redness, swelling, or pain. ? Fluid or blood. ? Warmth. ? Pus or a bad smell.  Do not take baths, swim, or use a hot tub until your health care provider approves. Ask your health care provider if you can take showers. Activity   Return to your normal activities as told by your health care provider. Ask your health care provider what activities are safe for you.  Do not drive for 24 hours if you were given a sedative during your procedure. General instructions  Follow instructions from your health care provider about how to manage your implantable loop recorder and transmit the information. Learn how to activate a recording if this is necessary for your type of device.  Do not go through  a metal detection gate, and do not let someone hold a metal detector over your chest. Show your ID card.  Do not have an MRI unless you check with your health care provider first.  Take over-the-counter and prescription medicines only as told by your health care provider.  Keep all follow-up visits as told by your health care provider. This is important. Contact a health care provider if:  You have redness, swelling, or pain around your incision.  You have a fever.  You have pain that is not relieved by your pain medicine.  You have triggered your device because of fainting (syncope) or because of a heartbeat that feels like it is racing, slow, fluttering, or skipping (palpitations). Get help right away if you have:  Chest pain.  Difficulty breathing. Summary  After the procedure, it is common to have soreness or discomfort near the incision.  Change your dressing as told by your health care provider.  Follow instructions from your health care provider about how to manage your implantable loop recorder and transmit the information.  Keep all follow-up visits as told by your health care provider. This is important. This information is not intended to replace advice given to you by your health care provider. Make sure you discuss any questions you have with your health care provider. Document Revised: 07/27/2017 Document Reviewed: 07/27/2017 Elsevier Patient Education  2020 [employer]  

## 2019-07-29 DIAGNOSIS — Z95818 Presence of other cardiac implants and grafts: Secondary | ICD-10-CM | POA: Insufficient documentation

## 2022-09-03 LAB — COLOGUARD: COLOGUARD: POSITIVE — AB

## 2023-02-15 ENCOUNTER — Inpatient Hospital Stay (HOSPITAL_COMMUNITY): Payer: Medicare (Managed Care)

## 2023-02-15 ENCOUNTER — Other Ambulatory Visit: Payer: Self-pay

## 2023-02-15 ENCOUNTER — Inpatient Hospital Stay (HOSPITAL_COMMUNITY)
Admission: EM | Admit: 2023-02-15 | Discharge: 2023-02-18 | DRG: 481 | Disposition: A | Discharge status: Home Health | Payer: Medicare (Managed Care) | Attending: Internal Medicine | Admitting: Internal Medicine

## 2023-02-15 ENCOUNTER — Observation Stay (HOSPITAL_COMMUNITY): Payer: Medicare (Managed Care)

## 2023-02-15 ENCOUNTER — Encounter (HOSPITAL_COMMUNITY)
Admission: EM | Disposition: A | Discharge status: Home Health | Payer: Self-pay | Source: Home / Self Care | Attending: Internal Medicine

## 2023-02-15 ENCOUNTER — Inpatient Hospital Stay (HOSPITAL_COMMUNITY): Payer: Medicare (Managed Care) | Admitting: Certified Registered"

## 2023-02-15 ENCOUNTER — Emergency Department (HOSPITAL_COMMUNITY): Payer: Medicare (Managed Care)

## 2023-02-15 ENCOUNTER — Encounter (HOSPITAL_COMMUNITY): Payer: Self-pay | Admitting: Internal Medicine

## 2023-02-15 DIAGNOSIS — D62 Acute posthemorrhagic anemia: Secondary | ICD-10-CM | POA: Diagnosis not present

## 2023-02-15 DIAGNOSIS — E1159 Type 2 diabetes mellitus with other circulatory complications: Secondary | ICD-10-CM | POA: Diagnosis present

## 2023-02-15 DIAGNOSIS — R42 Dizziness and giddiness: Secondary | ICD-10-CM

## 2023-02-15 DIAGNOSIS — Z9842 Cataract extraction status, left eye: Secondary | ICD-10-CM | POA: Diagnosis not present

## 2023-02-15 DIAGNOSIS — Z91014 Allergy to mammalian meats: Secondary | ICD-10-CM

## 2023-02-15 DIAGNOSIS — Z833 Family history of diabetes mellitus: Secondary | ICD-10-CM

## 2023-02-15 DIAGNOSIS — Z794 Long term (current) use of insulin: Secondary | ICD-10-CM | POA: Diagnosis not present

## 2023-02-15 DIAGNOSIS — Z87891 Personal history of nicotine dependence: Secondary | ICD-10-CM

## 2023-02-15 DIAGNOSIS — F32A Depression, unspecified: Secondary | ICD-10-CM | POA: Diagnosis present

## 2023-02-15 DIAGNOSIS — I480 Paroxysmal atrial fibrillation: Secondary | ICD-10-CM | POA: Diagnosis present

## 2023-02-15 DIAGNOSIS — Z87442 Personal history of urinary calculi: Secondary | ICD-10-CM | POA: Diagnosis not present

## 2023-02-15 DIAGNOSIS — Z9841 Cataract extraction status, right eye: Secondary | ICD-10-CM

## 2023-02-15 DIAGNOSIS — E1142 Type 2 diabetes mellitus with diabetic polyneuropathy: Secondary | ICD-10-CM | POA: Diagnosis present

## 2023-02-15 DIAGNOSIS — I252 Old myocardial infarction: Secondary | ICD-10-CM

## 2023-02-15 DIAGNOSIS — Z880 Allergy status to penicillin: Secondary | ICD-10-CM

## 2023-02-15 DIAGNOSIS — I959 Hypotension, unspecified: Secondary | ICD-10-CM | POA: Diagnosis not present

## 2023-02-15 DIAGNOSIS — Z888 Allergy status to other drugs, medicaments and biological substances status: Secondary | ICD-10-CM | POA: Diagnosis not present

## 2023-02-15 DIAGNOSIS — Z961 Presence of intraocular lens: Secondary | ICD-10-CM | POA: Diagnosis present

## 2023-02-15 DIAGNOSIS — Z79899 Other long term (current) drug therapy: Secondary | ICD-10-CM

## 2023-02-15 DIAGNOSIS — S72141A Displaced intertrochanteric fracture of right femur, initial encounter for closed fracture: Secondary | ICD-10-CM | POA: Diagnosis present

## 2023-02-15 DIAGNOSIS — I10 Essential (primary) hypertension: Secondary | ICD-10-CM | POA: Diagnosis present

## 2023-02-15 DIAGNOSIS — J45909 Unspecified asthma, uncomplicated: Secondary | ICD-10-CM | POA: Diagnosis present

## 2023-02-15 DIAGNOSIS — G8929 Other chronic pain: Secondary | ICD-10-CM | POA: Diagnosis present

## 2023-02-15 DIAGNOSIS — Y92009 Unspecified place in unspecified non-institutional (private) residence as the place of occurrence of the external cause: Secondary | ICD-10-CM | POA: Diagnosis not present

## 2023-02-15 DIAGNOSIS — Z531 Procedure and treatment not carried out because of patient's decision for reasons of belief and group pressure: Secondary | ICD-10-CM | POA: Diagnosis present

## 2023-02-15 DIAGNOSIS — S72001A Fracture of unspecified part of neck of right femur, initial encounter for closed fracture: Secondary | ICD-10-CM

## 2023-02-15 DIAGNOSIS — S72009A Fracture of unspecified part of neck of unspecified femur, initial encounter for closed fracture: Secondary | ICD-10-CM | POA: Diagnosis present

## 2023-02-15 DIAGNOSIS — D638 Anemia in other chronic diseases classified elsewhere: Secondary | ICD-10-CM | POA: Diagnosis present

## 2023-02-15 DIAGNOSIS — W19XXXA Unspecified fall, initial encounter: Secondary | ICD-10-CM

## 2023-02-15 DIAGNOSIS — N390 Urinary tract infection, site not specified: Secondary | ICD-10-CM | POA: Diagnosis present

## 2023-02-15 DIAGNOSIS — E785 Hyperlipidemia, unspecified: Secondary | ICD-10-CM | POA: Diagnosis present

## 2023-02-15 DIAGNOSIS — W1830XA Fall on same level, unspecified, initial encounter: Secondary | ICD-10-CM | POA: Diagnosis present

## 2023-02-15 DIAGNOSIS — Z87440 Personal history of urinary (tract) infections: Secondary | ICD-10-CM

## 2023-02-15 DIAGNOSIS — Z955 Presence of coronary angioplasty implant and graft: Secondary | ICD-10-CM

## 2023-02-15 DIAGNOSIS — Z823 Family history of stroke: Secondary | ICD-10-CM

## 2023-02-15 DIAGNOSIS — N3000 Acute cystitis without hematuria: Secondary | ICD-10-CM | POA: Diagnosis not present

## 2023-02-15 DIAGNOSIS — Z7982 Long term (current) use of aspirin: Secondary | ICD-10-CM

## 2023-02-15 DIAGNOSIS — F40240 Claustrophobia: Secondary | ICD-10-CM | POA: Diagnosis present

## 2023-02-15 DIAGNOSIS — Z91040 Latex allergy status: Secondary | ICD-10-CM

## 2023-02-15 DIAGNOSIS — Z8673 Personal history of transient ischemic attack (TIA), and cerebral infarction without residual deficits: Secondary | ICD-10-CM | POA: Diagnosis not present

## 2023-02-15 DIAGNOSIS — Z8249 Family history of ischemic heart disease and other diseases of the circulatory system: Secondary | ICD-10-CM

## 2023-02-15 DIAGNOSIS — S0083XA Contusion of other part of head, initial encounter: Secondary | ICD-10-CM | POA: Diagnosis present

## 2023-02-15 DIAGNOSIS — E119 Type 2 diabetes mellitus without complications: Secondary | ICD-10-CM

## 2023-02-15 DIAGNOSIS — Z7902 Long term (current) use of antithrombotics/antiplatelets: Secondary | ICD-10-CM

## 2023-02-15 DIAGNOSIS — I2510 Atherosclerotic heart disease of native coronary artery without angina pectoris: Secondary | ICD-10-CM | POA: Diagnosis present

## 2023-02-15 HISTORY — PX: INTRAMEDULLARY (IM) NAIL INTERTROCHANTERIC: SHX5875

## 2023-02-15 LAB — CBC WITH DIFFERENTIAL/PLATELET
Abs Immature Granulocytes: 0.05 10*3/uL (ref 0.00–0.07)
Basophils Absolute: 0 10*3/uL (ref 0.0–0.1)
Basophils Relative: 0 %
Eosinophils Absolute: 0.1 10*3/uL (ref 0.0–0.5)
Eosinophils Relative: 1 %
HCT: 33.4 % — ABNORMAL LOW (ref 36.0–46.0)
Hemoglobin: 10.8 g/dL — ABNORMAL LOW (ref 12.0–15.0)
Immature Granulocytes: 1 %
Lymphocytes Relative: 29 %
Lymphs Abs: 2.9 10*3/uL (ref 0.7–4.0)
MCH: 27.9 pg (ref 26.0–34.0)
MCHC: 32.3 g/dL (ref 30.0–36.0)
MCV: 86.3 fL (ref 80.0–100.0)
Monocytes Absolute: 0.9 10*3/uL (ref 0.1–1.0)
Monocytes Relative: 9 %
Neutro Abs: 5.9 10*3/uL (ref 1.7–7.7)
Neutrophils Relative %: 60 %
Platelets: 268 10*3/uL (ref 150–400)
RBC: 3.87 MIL/uL (ref 3.87–5.11)
RDW: 12.3 % (ref 11.5–15.5)
WBC: 9.8 10*3/uL (ref 4.0–10.5)
nRBC: 0 % (ref 0.0–0.2)

## 2023-02-15 LAB — COMPREHENSIVE METABOLIC PANEL
ALT: 16 U/L (ref 0–44)
AST: 16 U/L (ref 15–41)
Albumin: 3.6 g/dL (ref 3.5–5.0)
Alkaline Phosphatase: 76 U/L (ref 38–126)
Anion gap: 9 (ref 5–15)
BUN: 16 mg/dL (ref 8–23)
CO2: 24 mmol/L (ref 22–32)
Calcium: 9.7 mg/dL (ref 8.9–10.3)
Chloride: 103 mmol/L (ref 98–111)
Creatinine, Ser: 0.9 mg/dL (ref 0.44–1.00)
GFR, Estimated: >60 mL/min (ref >60)
Glucose, Bld: 175 mg/dL — ABNORMAL HIGH (ref 70–99)
Potassium: 3.8 mmol/L (ref 3.5–5.1)
Sodium: 136 mmol/L (ref 135–145)
Total Bilirubin: 0.8 mg/dL (ref 0.3–1.2)
Total Protein: 6.5 g/dL (ref 6.5–8.1)

## 2023-02-15 LAB — URINALYSIS, ROUTINE W REFLEX MICROSCOPIC
Bilirubin Urine: NEGATIVE
Glucose, UA: 50 mg/dL — AB
Hgb urine dipstick: NEGATIVE
Ketones, ur: NEGATIVE mg/dL
Nitrite: NEGATIVE
Protein, ur: NEGATIVE mg/dL
Specific Gravity, Urine: 1.013 (ref 1.005–1.030)
pH: 6 (ref 5.0–8.0)

## 2023-02-15 LAB — GLUCOSE, CAPILLARY
Glucose-Capillary: 119 mg/dL — ABNORMAL HIGH (ref 70–99)
Glucose-Capillary: 134 mg/dL — ABNORMAL HIGH (ref 70–99)
Glucose-Capillary: 138 mg/dL — ABNORMAL HIGH (ref 70–99)
Glucose-Capillary: 153 mg/dL — ABNORMAL HIGH (ref 70–99)
Glucose-Capillary: 159 mg/dL — ABNORMAL HIGH (ref 70–99)
Glucose-Capillary: 166 mg/dL — ABNORMAL HIGH (ref 70–99)

## 2023-02-15 LAB — HEMOGLOBIN A1C
Hgb A1c MFr Bld: 6.8 % — ABNORMAL HIGH (ref 4.8–5.6)
Mean Plasma Glucose: 148.46 mg/dL

## 2023-02-15 LAB — SURGICAL PCR SCREEN
MRSA, PCR: NEGATIVE
Staphylococcus aureus: NEGATIVE

## 2023-02-15 LAB — HIV ANTIBODY (ROUTINE TESTING W REFLEX): HIV Screen 4th Generation wRfx: NONREACTIVE

## 2023-02-15 LAB — PROTIME-INR
INR: 1.1 (ref 0.8–1.2)
Prothrombin Time: 14 seconds (ref 11.4–15.2)

## 2023-02-15 LAB — NO BLOOD PRODUCTS

## 2023-02-15 SURGERY — FIXATION, FRACTURE, INTERTROCHANTERIC, WITH INTRAMEDULLARY ROD
Anesthesia: General | Site: Hip | Laterality: Right

## 2023-02-15 MED ORDER — DEXAMETHASONE SODIUM PHOSPHATE 10 MG/ML IJ SOLN
INTRAMUSCULAR | Status: AC
  Filled 2023-02-15: qty 1

## 2023-02-15 MED ORDER — EPHEDRINE 5 MG/ML INJ
INTRAVENOUS | Status: AC
  Filled 2023-02-15: qty 5

## 2023-02-15 MED ORDER — METHOCARBAMOL 500 MG PO TABS: 500.0000 mg | ORAL_TABLET | Freq: Four times a day (QID) | ORAL | Status: DC | PRN

## 2023-02-15 MED ORDER — ASPIRIN 81 MG PO TBEC
81.0000 mg | DELAYED_RELEASE_TABLET | Freq: Every day | ORAL | Status: DC
  Administered 2023-02-16 – 2023-02-18 (×3): 81 mg via ORAL
  Filled 2023-02-15 (×4): qty 1

## 2023-02-15 MED ORDER — MORPHINE SULFATE (PF) 2 MG/ML IV SOLN: 0.5000 mg | INTRAVENOUS | Status: DC | PRN

## 2023-02-15 MED ORDER — ACETAMINOPHEN 650 MG RE SUPP: 650.0000 mg | Freq: Four times a day (QID) | RECTAL | Status: DC | PRN

## 2023-02-15 MED ORDER — LACTATED RINGERS IV SOLN: INTRAVENOUS | Status: DC

## 2023-02-15 MED ORDER — KETOROLAC TROMETHAMINE 15 MG/ML IJ SOLN
15.0000 mg | Freq: Four times a day (QID) | INTRAMUSCULAR | Status: DC
  Administered 2023-02-15 – 2023-02-16 (×4): 15 mg via INTRAVENOUS
  Filled 2023-02-15 (×4): qty 1

## 2023-02-15 MED ORDER — CHLORHEXIDINE GLUCONATE 0.12 % MT SOLN
15.0000 mL | Freq: Once | OROMUCOSAL | Status: AC
  Administered 2023-02-15: 15 mL via OROMUCOSAL
  Filled 2023-02-15: qty 15

## 2023-02-15 MED ORDER — GLUCERNA SHAKE PO LIQD
237.0000 mL | Freq: Three times a day (TID) | ORAL | Status: DC
  Administered 2023-02-16 – 2023-02-17 (×2): 237 mL via ORAL

## 2023-02-15 MED ORDER — FENTANYL CITRATE (PF) 100 MCG/2ML IJ SOLN
INTRAMUSCULAR | Status: AC
  Filled 2023-02-15: qty 2

## 2023-02-15 MED ORDER — ACETAMINOPHEN 500 MG PO TABS
500.0000 mg | ORAL_TABLET | Freq: Four times a day (QID) | ORAL | Status: DC
  Administered 2023-02-15 – 2023-02-18 (×12): 500 mg via ORAL
  Filled 2023-02-15 (×13): qty 1

## 2023-02-15 MED ORDER — ACETAMINOPHEN 325 MG PO TABS
650.0000 mg | ORAL_TABLET | Freq: Four times a day (QID) | ORAL | Status: DC | PRN
  Administered 2023-02-15: 650 mg via ORAL
  Filled 2023-02-15: qty 2

## 2023-02-15 MED ORDER — FENTANYL CITRATE PF 50 MCG/ML IJ SOSY
50.0000 ug | PREFILLED_SYRINGE | INTRAMUSCULAR | Status: DC | PRN
  Administered 2023-02-15: 50 ug via INTRAVENOUS
  Filled 2023-02-15 (×2): qty 1

## 2023-02-15 MED ORDER — OXYCODONE HCL 5 MG PO TABS: 5.0000 mg | ORAL_TABLET | ORAL | Status: DC | PRN

## 2023-02-15 MED ORDER — INSULIN ASPART 100 UNIT/ML IJ SOLN
0.0000 [IU] | INTRAMUSCULAR | Status: DC | PRN
  Administered 2023-02-15: 2 [IU] via SUBCUTANEOUS

## 2023-02-15 MED ORDER — INSULIN GLARGINE-YFGN 100 UNIT/ML ~~LOC~~ SOLN
15.0000 [IU] | Freq: Every day | SUBCUTANEOUS | Status: DC
  Administered 2023-02-15 – 2023-02-17 (×3): 15 [IU] via SUBCUTANEOUS
  Filled 2023-02-15 (×4): qty 0.15

## 2023-02-15 MED ORDER — 0.9 % SODIUM CHLORIDE (POUR BTL) OPTIME
TOPICAL | Status: DC | PRN
  Administered 2023-02-15: 1000 mL

## 2023-02-15 MED ORDER — ONDANSETRON HCL 4 MG/2ML IJ SOLN
INTRAMUSCULAR | Status: DC | PRN
  Administered 2023-02-15: 4 mg via INTRAVENOUS

## 2023-02-15 MED ORDER — ATORVASTATIN CALCIUM 40 MG PO TABS
40.0000 mg | ORAL_TABLET | Freq: Every day | ORAL | Status: DC
  Administered 2023-02-15 – 2023-02-18 (×4): 40 mg via ORAL
  Filled 2023-02-15 (×4): qty 1

## 2023-02-15 MED ORDER — TRANEXAMIC ACID-NACL 1000-0.7 MG/100ML-% IV SOLN
1000.0000 mg | INTRAVENOUS | Status: AC
  Filled 2023-02-15: qty 100

## 2023-02-15 MED ORDER — ROCURONIUM BROMIDE 10 MG/ML (PF) SYRINGE: PREFILLED_SYRINGE | INTRAVENOUS | Status: DC | PRN

## 2023-02-15 MED ORDER — ROCURONIUM BROMIDE 100 MG/10ML IV SOLN
INTRAVENOUS | Status: DC | PRN
  Administered 2023-02-15: 40 mg via INTRAVENOUS

## 2023-02-15 MED ORDER — POVIDONE-IODINE 10 % EX SWAB: 2.0000 | Freq: Once | CUTANEOUS | Status: DC

## 2023-02-15 MED ORDER — ONDANSETRON HCL 4 MG PO TABS: 4.0000 mg | ORAL_TABLET | Freq: Four times a day (QID) | ORAL | Status: DC | PRN

## 2023-02-15 MED ORDER — ROCURONIUM BROMIDE 10 MG/ML (PF) SYRINGE
PREFILLED_SYRINGE | INTRAVENOUS | Status: AC
  Filled 2023-02-15: qty 10

## 2023-02-15 MED ORDER — INSULIN DEGLUDEC 100 UNIT/ML ~~LOC~~ SOPN
15.0000 [IU] | PEN_INJECTOR | Freq: Every day | SUBCUTANEOUS | Status: DC
  Filled 2023-02-15: qty 3

## 2023-02-15 MED ORDER — METOCLOPRAMIDE HCL 5 MG PO TABS: 5.0000 mg | ORAL_TABLET | Freq: Three times a day (TID) | ORAL | Status: DC | PRN

## 2023-02-15 MED ORDER — CHLORHEXIDINE GLUCONATE 4 % EX SOLN
60.0000 mL | Freq: Once | CUTANEOUS | Status: AC
  Administered 2023-02-15: 4 via TOPICAL

## 2023-02-15 MED ORDER — ONDANSETRON HCL 4 MG/2ML IJ SOLN
INTRAMUSCULAR | Status: AC
  Filled 2023-02-15: qty 2

## 2023-02-15 MED ORDER — FENTANYL CITRATE (PF) 250 MCG/5ML IJ SOLN
INTRAMUSCULAR | Status: AC
  Filled 2023-02-15: qty 5

## 2023-02-15 MED ORDER — PHENYLEPHRINE 80 MCG/ML (10ML) SYRINGE FOR IV PUSH (FOR BLOOD PRESSURE SUPPORT)
PREFILLED_SYRINGE | INTRAVENOUS | Status: AC
  Filled 2023-02-15: qty 10

## 2023-02-15 MED ORDER — DEXAMETHASONE SODIUM PHOSPHATE 10 MG/ML IJ SOLN
INTRAMUSCULAR | Status: DC | PRN
  Administered 2023-02-15: 4 mg via INTRAVENOUS

## 2023-02-15 MED ORDER — PROPOFOL 10 MG/ML IV BOLUS
INTRAVENOUS | Status: DC | PRN
  Administered 2023-02-15: 30 mg via INTRAVENOUS
  Administered 2023-02-15: 60 mg via INTRAVENOUS

## 2023-02-15 MED ORDER — SUGAMMADEX SODIUM 200 MG/2ML IV SOLN
INTRAVENOUS | Status: DC | PRN
  Administered 2023-02-15: 150 mg via INTRAVENOUS

## 2023-02-15 MED ORDER — SODIUM CHLORIDE 0.9 % IV SOLN: INTRAVENOUS | Status: DC

## 2023-02-15 MED ORDER — INSULIN ASPART 100 UNIT/ML IJ SOLN
INTRAMUSCULAR | Status: AC
  Filled 2023-02-15: qty 1

## 2023-02-15 MED ORDER — FENTANYL CITRATE (PF) 250 MCG/5ML IJ SOLN
INTRAMUSCULAR | Status: DC | PRN
  Administered 2023-02-15: 50 ug via INTRAVENOUS
  Administered 2023-02-15: 100 ug via INTRAVENOUS

## 2023-02-15 MED ORDER — TRANEXAMIC ACID-NACL 1000-0.7 MG/100ML-% IV SOLN
1000.0000 mg | Freq: Once | INTRAVENOUS | Status: AC
  Administered 2023-02-15: 1000 mg via INTRAVENOUS
  Filled 2023-02-15: qty 100

## 2023-02-15 MED ORDER — DIPHENHYDRAMINE HCL 12.5 MG/5ML PO ELIX: 12.5000 mg | ORAL_SOLUTION | ORAL | Status: DC | PRN

## 2023-02-15 MED ORDER — METHOCARBAMOL 1000 MG/10ML IJ SOLN: 500.0000 mg | Freq: Four times a day (QID) | INTRAVENOUS | Status: DC | PRN

## 2023-02-15 MED ORDER — CEFAZOLIN SODIUM-DEXTROSE 2-4 GM/100ML-% IV SOLN
2.0000 g | INTRAVENOUS | Status: AC
  Administered 2023-02-15: 2 g via INTRAVENOUS
  Filled 2023-02-15: qty 100

## 2023-02-15 MED ORDER — PROPOFOL 10 MG/ML IV BOLUS
INTRAVENOUS | Status: AC
  Filled 2023-02-15: qty 20

## 2023-02-15 MED ORDER — MORPHINE SULFATE (PF) 2 MG/ML IV SOLN: 2.0000 mg | INTRAVENOUS | Status: DC | PRN

## 2023-02-15 MED ORDER — ONDANSETRON HCL 4 MG/2ML IJ SOLN: 4.0000 mg | Freq: Four times a day (QID) | INTRAMUSCULAR | Status: DC | PRN

## 2023-02-15 MED ORDER — CEFAZOLIN SODIUM-DEXTROSE 2-4 GM/100ML-% IV SOLN
2.0000 g | Freq: Three times a day (TID) | INTRAVENOUS | Status: AC
  Administered 2023-02-15 – 2023-02-16 (×3): 2 g via INTRAVENOUS
  Filled 2023-02-15 (×3): qty 100

## 2023-02-15 MED ORDER — CLOPIDOGREL BISULFATE 75 MG PO TABS
75.0000 mg | ORAL_TABLET | Freq: Every day | ORAL | Status: DC
  Administered 2023-02-16 – 2023-02-18 (×3): 75 mg via ORAL
  Filled 2023-02-15 (×3): qty 1

## 2023-02-15 MED ORDER — OXYCODONE HCL 5 MG/5ML PO SOLN
5.0000 mg | Freq: Once | ORAL | Status: AC | PRN
  Administered 2023-02-15: 5 mg via ORAL

## 2023-02-15 MED ORDER — OXYCODONE HCL 5 MG/5ML PO SOLN
ORAL | Status: AC
  Filled 2023-02-15: qty 5

## 2023-02-15 MED ORDER — SULFAMETHOXAZOLE-TRIMETHOPRIM 800-160 MG PO TABS
1.0000 | ORAL_TABLET | Freq: Two times a day (BID) | ORAL | Status: DC
  Administered 2023-02-15 – 2023-02-16 (×2): 1 via ORAL
  Filled 2023-02-15 (×2): qty 1

## 2023-02-15 MED ORDER — PHENYLEPHRINE 80 MCG/ML (10ML) SYRINGE FOR IV PUSH (FOR BLOOD PRESSURE SUPPORT)
PREFILLED_SYRINGE | INTRAVENOUS | Status: DC | PRN
  Administered 2023-02-15: 80 ug via INTRAVENOUS
  Administered 2023-02-15: 160 ug via INTRAVENOUS

## 2023-02-15 MED ORDER — LIDOCAINE 2% (20 MG/ML) 5 ML SYRINGE
INTRAMUSCULAR | Status: DC | PRN
  Administered 2023-02-15: 60 mg via INTRAVENOUS

## 2023-02-15 MED ORDER — HYDROCODONE-ACETAMINOPHEN 5-325 MG PO TABS: 1.0000 | ORAL_TABLET | Freq: Four times a day (QID) | ORAL | Status: DC | PRN

## 2023-02-15 MED ORDER — CITALOPRAM HYDROBROMIDE 10 MG PO TABS: 10.0000 mg | ORAL_TABLET | Freq: Every evening | ORAL | Status: DC

## 2023-02-15 MED ORDER — MIDAZOLAM HCL 2 MG/2ML IJ SOLN
INTRAMUSCULAR | Status: AC
  Filled 2023-02-15: qty 2

## 2023-02-15 MED ORDER — POLYETHYLENE GLYCOL 3350 17 G PO PACK: 17.0000 g | PACK | Freq: Every day | ORAL | Status: DC | PRN

## 2023-02-15 MED ORDER — TRANEXAMIC ACID-NACL 1000-0.7 MG/100ML-% IV SOLN
INTRAVENOUS | Status: DC | PRN
Start: 2023-02-15 — End: 2023-02-15
  Administered 2023-02-15: 1000 mg via INTRAVENOUS

## 2023-02-15 MED ORDER — LOSARTAN POTASSIUM 50 MG PO TABS
50.0000 mg | ORAL_TABLET | Freq: Every evening | ORAL | Status: DC
  Administered 2023-02-15 – 2023-02-18 (×4): 50 mg via ORAL
  Filled 2023-02-15 (×4): qty 1

## 2023-02-15 MED ORDER — INSULIN ASPART 100 UNIT/ML IJ SOLN
0.0000 [IU] | INTRAMUSCULAR | Status: DC
  Administered 2023-02-15 (×2): 3 [IU] via SUBCUTANEOUS
  Administered 2023-02-16: 2 [IU] via SUBCUTANEOUS
  Administered 2023-02-16: 3 [IU] via SUBCUTANEOUS
  Administered 2023-02-16: 2 [IU] via SUBCUTANEOUS

## 2023-02-15 MED ORDER — ACETAMINOPHEN 500 MG PO TABS: 1000.0000 mg | ORAL_TABLET | Freq: Once | ORAL | Status: DC | PRN

## 2023-02-15 MED ORDER — OXYCODONE HCL 5 MG PO TABS
5.0000 mg | ORAL_TABLET | ORAL | Status: DC | PRN
  Administered 2023-02-15 (×2): 5 mg via ORAL
  Filled 2023-02-15 (×2): qty 1

## 2023-02-15 MED ORDER — METOCLOPRAMIDE HCL 5 MG/ML IJ SOLN: 5.0000 mg | Freq: Three times a day (TID) | INTRAMUSCULAR | Status: DC | PRN

## 2023-02-15 MED ORDER — ACETAMINOPHEN 160 MG/5ML PO SOLN: 1000.0000 mg | Freq: Once | ORAL | Status: DC | PRN

## 2023-02-15 MED ORDER — EPHEDRINE SULFATE-NACL 50-0.9 MG/10ML-% IV SOSY
PREFILLED_SYRINGE | INTRAVENOUS | Status: DC | PRN
  Administered 2023-02-15 (×2): 10 mg via INTRAVENOUS

## 2023-02-15 MED ORDER — HEPARIN SODIUM (PORCINE) 5000 UNIT/ML IJ SOLN: 5000.0000 [IU] | Freq: Three times a day (TID) | INTRAMUSCULAR | Status: DC

## 2023-02-15 MED ORDER — PHENYLEPHRINE HCL-NACL 20-0.9 MG/250ML-% IV SOLN
INTRAVENOUS | Status: DC | PRN
  Administered 2023-02-15: 50 ug/min via INTRAVENOUS

## 2023-02-15 MED ORDER — DOCUSATE SODIUM 100 MG PO CAPS
100.0000 mg | ORAL_CAPSULE | Freq: Two times a day (BID) | ORAL | Status: DC
  Administered 2023-02-15 – 2023-02-18 (×6): 100 mg via ORAL
  Filled 2023-02-15 (×6): qty 1

## 2023-02-15 MED ORDER — FENTANYL CITRATE (PF) 100 MCG/2ML IJ SOLN
25.0000 ug | INTRAMUSCULAR | Status: DC | PRN
  Administered 2023-02-15: 50 ug via INTRAVENOUS
  Administered 2023-02-15: 25 ug via INTRAVENOUS

## 2023-02-15 MED ORDER — VANCOMYCIN HCL 1000 MG IV SOLR
INTRAVENOUS | Status: AC
  Filled 2023-02-15: qty 20

## 2023-02-15 MED ORDER — OXYCODONE HCL 5 MG PO TABS: 5.0000 mg | ORAL_TABLET | Freq: Once | ORAL | Status: AC | PRN

## 2023-02-15 MED ORDER — ACETAMINOPHEN 10 MG/ML IV SOLN
1000.0000 mg | Freq: Once | INTRAVENOUS | Status: DC | PRN
  Administered 2023-02-15: 1000 mg via INTRAVENOUS

## 2023-02-15 MED ORDER — ORAL CARE MOUTH RINSE: 15.0000 mL | Freq: Once | OROMUCOSAL | Status: AC

## 2023-02-15 MED ORDER — ONDANSETRON HCL 4 MG/2ML IJ SOLN
4.0000 mg | Freq: Four times a day (QID) | INTRAMUSCULAR | Status: DC | PRN
  Administered 2023-02-16 (×2): 4 mg via INTRAVENOUS
  Filled 2023-02-15 (×2): qty 2

## 2023-02-15 SURGICAL SUPPLY — 50 items
ADH SKN CLS APL DERMABOND .7 (GAUZE/BANDAGES/DRESSINGS) ×1
APL PRP STRL LF DISP 70% ISPRP (MISCELLANEOUS)
BAG COUNTER SPONGE SURGICOUNT (BAG) IMPLANT
BAG SPNG CNTER NS LX DISP (BAG)
BIT DRILL INTERTAN LAG SCREW (BIT) IMPLANT
BIT DRILL LONG 4.0 (BIT) IMPLANT
BRUSH SCRUB EZ PLAIN DRY (MISCELLANEOUS) ×2 IMPLANT
CHLORAPREP W/TINT 26 (MISCELLANEOUS) ×1 IMPLANT
COVER PERINEAL POST (MISCELLANEOUS) ×1 IMPLANT
COVER SURGICAL LIGHT HANDLE (MISCELLANEOUS) ×1 IMPLANT
DERMABOND ADVANCED .7 DNX12 (GAUZE/BANDAGES/DRESSINGS) ×1 IMPLANT
DRAPE C-ARM 35X43 STRL (DRAPES) ×1 IMPLANT
DRAPE IMP U-DRAPE 54X76 (DRAPES) ×2 IMPLANT
DRAPE INCISE IOBAN 66X45 STRL (DRAPES) ×1 IMPLANT
DRAPE STERI IOBAN 125X83 (DRAPES) ×1 IMPLANT
DRAPE SURG 17X23 STRL (DRAPES) ×2 IMPLANT
DRAPE U-SHAPE 47X51 STRL (DRAPES) ×1 IMPLANT
DRESSING MEPILEX FLEX 4X4 (GAUZE/BANDAGES/DRESSINGS) ×1 IMPLANT
DRILL BIT LONG 4.0 (BIT) ×1
DRSG MEPILEX FLEX 4X4 (GAUZE/BANDAGES/DRESSINGS) ×1
DRSG MEPILEX POST OP 4X8 (GAUZE/BANDAGES/DRESSINGS) ×1 IMPLANT
ELECT REM PT RETURN 9FT ADLT (ELECTROSURGICAL)
ELECTRODE REM PT RTRN 9FT ADLT (ELECTROSURGICAL) ×1 IMPLANT
GLOVE BIOGEL PI IND STRL 6.5 (GLOVE) IMPLANT
GLOVE BIOGEL PI IND STRL 7.5 (GLOVE) ×1 IMPLANT
GLOVE SURG SS PI 6.5 STRL IVOR (GLOVE) IMPLANT
GLOVE SURG SS PI 7.5 STRL IVOR (GLOVE) IMPLANT
GOWN STRL REUS W/ TWL LRG LVL3 (GOWN DISPOSABLE) ×1 IMPLANT
GOWN STRL REUS W/TWL LRG LVL3 (GOWN DISPOSABLE) ×1
GUIDE PIN 3.2X343 (PIN) ×2
GUIDE PIN 3.2X343MM (PIN) ×2
KIT BASIN OR (CUSTOM PROCEDURE TRAY) ×1 IMPLANT
KIT TURNOVER KIT B (KITS) ×1 IMPLANT
MANIFOLD NEPTUNE II (INSTRUMENTS) ×1 IMPLANT
NAIL INTERTAN 10X18 130D 10S (Nail) IMPLANT
NS IRRIG 1000ML POUR BTL (IV SOLUTION) ×1 IMPLANT
PACK GENERAL/GYN (CUSTOM PROCEDURE TRAY) ×1 IMPLANT
PAD ARMBOARD 7.5X6 YLW CONV (MISCELLANEOUS) ×2 IMPLANT
PIN GUIDE 3.2X343MM (PIN) IMPLANT
SCREW LAG COMPR KIT 90/85 (Screw) IMPLANT
SCREW TRIGEN LOW PROF 5.0X35 (Screw) IMPLANT
SUT MNCRL AB 3-0 PS2 18 (SUTURE) ×1 IMPLANT
SUT MON AB 2-0 CT1 36 (SUTURE) IMPLANT
SUT VIC AB 0 CT1 27 (SUTURE)
SUT VIC AB 0 CT1 27XBRD ANBCTR (SUTURE) IMPLANT
SUT VIC AB 2-0 CT1 27 (SUTURE) ×1
SUT VIC AB 2-0 CT1 TAPERPNT 27 (SUTURE) ×2 IMPLANT
SUT VIC AB CT1 27XBRD ANBCTRL (SUTURE)
TOWEL GREEN STERILE (TOWEL DISPOSABLE) ×2 IMPLANT
WATER STERILE IRR 1000ML POUR (IV SOLUTION) ×1 IMPLANT

## 2023-02-15 NOTE — Inpatient Diabetes Management (Signed)
Inpatient Diabetes Program Recommendations  AACE/ADA: New Consensus Statement on Inpatient Glycemic Control (2015)  Target Ranges:  Prepandial:   less than 140 mg/dL      Peak postprandial:   less than 180 mg/dL (1-2 hours)      Critically ill patients:  140 - 180 mg/dL   Lab Results  Component Value Date   GLUCAP 119 (H) 02/15/2023   HGBA1C 12.8 (H) 03/18/2019    Review of Glycemic Control  Diabetes history: type 2 Outpatient Diabetes medications: Tresiba 20 units at [facility] Current orders for Inpatient glycemic control: Tresiba 15 units daily, Novolog 0-15 units every 4 hours  Inpatient Diabetes Program Recommendations:   Received diabetes coordinator consult. Unable to talk with patient. In surgery now. Spoke with staff RN to discuss with the patient to make sure that she is taking her [place] every day and to follow up with her PCP for glucose control.   Smith Mince RN BSN CDE Diabetes Coordinator Pager: [phone]  8am-5pm

## 2023-02-15 NOTE — Progress Notes (Signed)
BRIEF PHARMACY MONITORING NOTE  67 yo female presented with R intertrochanteric femur fracture. S/p cephalomedullary nailing of fracture 8/23. Ok to resume clopidogrel on POD1 per ortho consult  Plan: -Clopidogrel 75mg  PO daily starting 8/24  Rexford Maus, PharmD, BCPS 02/15/2023 2:32 PM

## 2023-02-15 NOTE — ED Triage Notes (Signed)
Pt BIB [facility] from home. Pt was ambulating to the bathroom and tripped. Pt denies LOC. Pts R leg shorter than left, crepitus noted to R hip, hematoma to R side of head. Pt takes Plavix.   fentanyl given in route

## 2023-02-15 NOTE — Transfer of Care (Signed)
Immediate Anesthesia Transfer of Care Note  Patient: JADENE MENTER  Procedure(s) Performed: INTRAMEDULLARY NAILING RIGHT HIP (Right: Hip)  Patient Location: PACU  Anesthesia Type:General  Level of Consciousness: awake and alert   Airway & Oxygen Therapy: Patient Spontanous Breathing and Patient connected to nasal cannula oxygen  Post-op Assessment: Report given to RN and Post -op Vital signs reviewed and stable  Post vital signs: Reviewed and stable  Last Vitals:  Vitals Value Taken Time  BP 142/89 02/15/23 1310  Temp 36.9 C 02/15/23 1310  Pulse 95 02/15/23 1313  Resp 12 02/15/23 1313  SpO2 100 % 02/15/23 1313  Vitals shown include unfiled device data.  Last Pain:  Vitals:   02/15/23 1058  TempSrc:   PainSc: 0-No pain      Patients Stated Pain Goal: 0 (02/15/23 1058)  Complications: No notable events documented.

## 2023-02-15 NOTE — Anesthesia Procedure Notes (Signed)
Procedure Name: Intubation Date/Time: 02/15/2023 12:04 PM  Performed by: Alwyn Ren, CRNAPre-anesthesia Checklist: Patient identified, Emergency Drugs available, Suction available and Patient being monitored Patient Re-evaluated:Patient Re-evaluated prior to induction Oxygen Delivery Method: Circle system utilized Preoxygenation: Pre-oxygenation with 100% oxygen Induction Type: IV induction Ventilation: Mask ventilation without difficulty Laryngoscope Size: Miller and 2 Grade View: Grade I Tube type: Oral Tube size: 6.5 mm Number of attempts: 3 Airway Equipment and Method: Stylet and Oral airway Placement Confirmation: ETT inserted through vocal cords under direct vision, positive ETCO2 and breath sounds checked- equal and bilateral Secured at: 20 cm Tube secured with: Tape Dental Injury: Teeth and Oropharynx as per pre-operative assessment  Comments: Unable to pass 7.0 through cords. Dr. Maple Hudson attempted 7.0 but needed 6.5 to intubate patient.

## 2023-02-15 NOTE — Progress Notes (Signed)
Orthopedic Tech Progress Note Patient Details:  [name] 1956/03/09 [id number]  Patient ID: [name], female   DOB: [birth year], 66 y.o.   MRN: [id number] Level II; not currently needed. Darleen Crocker 02/15/2023, 2:07 AM

## 2023-02-15 NOTE — Assessment & Plan Note (Signed)
BP elevated at admission - 2/2 acute pain.  Plan Continue home regimen ARB

## 2023-02-15 NOTE — Consult Note (Signed)
Orthopaedic Trauma Service (OTS) Consult   Patient ID: [name] MRN: [id number] DOB/AGE: 1956/05/29 67 y.o.  Reason for Consult:Right intertrochanteric femur fracture Referring Physician: Dr. Tilden Fossa, MD [facility]  HPI: [name] is an 68 y.o. female who is being seen in consultation at the request of Dr. Madilyn Hook for evaluation of right intertrochanteric femur fracture.  Patient sustained a ground-level fall and sustained a right intertrochanteric femur fracture.  She was subsequently transferred to [facility] and the x-ray showed the above fracture.  Orthopedics was consulted.  Patient has a history of diabetes as well as hypertension.  She is on Plavix and aspirin.  She refuses all blood products.  She lives at home with her husband.  They have no steps to enter she has a downstairs bedroom and bathroom.  Denies any other injuries other than a sore neck.  Past Medical History:  Diagnosis Date   Anxiety    a. with claustrophobia   Arthritis    "right hand; maybe just starting in my left hand" (04/21/2012)   Asthma    CAD (coronary artery disease)    a. PCI [place] 2004. b. Angina - balloon angioplasty to LAD 04/21/12 (vessel too small for stent).   Carotid stenosis    a. 11/2011 critical R, ~30% L by angiogram;  b. 04/2012 s/p R CEA.   Cataract    left   Chronic lower back pain    Depression    a. occasionally takes st john's wart   Diabetes mellitus type 2, uncontrolled    Dysrhythmia    Embolic stroke (HCC)    a. 11/2011 MRI: mult small b/l infarcts c/w emboli;  b. 11/2011 MRA neck: high grade RICA stenosis;  c. 6/20013 TEE: EF 55-60% with mild LVH, severe MAC, < 1 cm calc mobile structure on the MV annulus, ? 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 Thought was RICA stenosis did not explain the b/l small strokes; concern for emboli from mobile MV struct;  d. 01/2012 Event Mon:  no AFib   History of echocardiogram    Echo 12/16: mild LVH, EF  55-60%, no RWMA, Gr 2 DD, trivial AI, MAC, mild MR, mild LAE   History of kidney stones    History of nuclear stress test    a. Myoview 12/16: EF 46%, no ischemia, Intermediate Risk (low EF)   HLD (hyperlipidemia)    a. 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 visual changes   Myocardial infarction [facility])    prior to 2013   Peripheral neuropathy    Refusal of blood transfusions as patient is Jehovah's Witness    Stroke (HCC) 2015- dec   mini stroke   Uncontrolled type 2 diabetes mellitus with circulatory disorder, with long-term current use of insulin 05/10/2015    Past Surgical History:  Procedure Laterality Date   ANKLE SURGERY  2005   after fall down stairs, left with screws and plate in place   CARDIAC CATHETERIZATION  2004   WNL, no significant coronary disease   CARDIAC CATHETERIZATION  12/19/11   CAROTID ANGIOGRAM Bilateral 12/19/2011   Procedure: CAROTID ANGIOGRAM;  Surgeon: Nada Libman, MD;  Location: [facility] CATH LAB;  Service: Cardiovascular;  Laterality: Bilateral;   CAROTID ENDARTERECTOMY     CARPAL TUNNEL RELEASE  ~ 2005   bilateral   CATARACT EXTRACTION W/ INTRAOCULAR LENS IMPLANT  2012  right   CATARACT EXTRACTION W/PHACO Left 10/2015   Dr Noel Gerold at [facility]   CORONARY ANGIOPLASTY  04/21/2012   95% stenosis mid LAD, vessel not large enough for stent, rec plavix/ASA for 1 mo   CORONARY ANGIOPLASTY WITH STENT PLACEMENT  2002   x2   ENDARTERECTOMY  05/09/2012   Procedure: ENDARTERECTOMY CAROTID;  Surgeon: Nada Libman, MD;  Location: [facility] OR;  Service: Vascular;  Laterality: Right;  Right carotd endarterectomy with bovine patch angioplasty   ENDARTERECTOMY Left 06/23/2015   Procedure: ENDARTERECTOMY CAROTID;  Surgeon: Nada Libman, MD;  Location: [facility] OR;  Service: Vascular;  Laterality: Left;   EYE SURGERY Left 04/2015   laser surgery,blood vessels rupturing   holter monitor  2013   WNL   KIDNEY STONE SURGERY      several times   LITHOTRIPSY     LOOP RECORDER INSERTION N/A 07/22/2019   Procedure: LOOP RECORDER INSERTION;  Surgeon: Marcina Millard, MD;  Location: [facility] INVASIVE CV LAB;  Service: Cardiovascular;  Laterality: N/A;   PARS PLANA VITRECTOMY Left 04/2015   [facility])   [facility] ANGIOPLASTY Left 06/23/2015   Procedure: PATCH ANGIOPLASTY USING 1cm x 4cm BOVINE PERICARDIAL PATCH;  Surgeon: Nada Libman, MD;  Location: [facility] OR;  Service: Vascular;  Laterality: Left;   PERCUTANEOUS CORONARY STENT INTERVENTION (PCI-S) N/A 04/21/2012   Procedure: PERCUTANEOUS CORONARY STENT INTERVENTION (PCI-S);  Surgeon: Kathleene Hazel, MD;  Location: [facility] CATH LAB;  Service: Cardiovascular;  Laterality: N/A;   TEE WITHOUT CARDIOVERSION  12/14/2011   Procedure: TRANSESOPHAGEAL ECHOCARDIOGRAM (TEE);  Surgeon: Laurey Morale, MD;  Location: [facility] ENDOSCOPY;  Service: Cardiovascular;  Laterality: N/A;   US ECHOCARDIOGRAPHY  2002   normal LV fxn, mild LVH, borderline L atrial size    Family History  Problem Relation Age of Onset   Heart disease Mother        valve problems   Stroke Mother    Hypertension Mother    Heart disease Sister        valve problems   Heart disease Brother    Diabetes Father    Hypertension Father    AAA (abdominal aortic aneurysm) Father    Cancer Neg Hx     Social History:  reports that she quit smoking about 50 years ago. Her smoking use included cigarettes. She has never used smokeless tobacco. She reports that she does not drink alcohol and does not use drugs.  Allergies:  Allergies  Allergen Reactions   Other Shortness Of Breath    No blood or blood products   Penicillins Itching and Rash   Toradol [Ketorolac Tromethamine] Other (See Comments)    Panic attack    Imdur [Isosorbide Nitrate] Other (See Comments)    Headaches    Latex Rash    Rash on all points of contact (thick, heavy itching rash)   Metformin And Related Diarrhea and Nausea And Vomiting     Medications:  No current facility-administered medications on file prior to encounter.   Current Outpatient Medications on File Prior to Encounter  Medication Sig Dispense Refill   ALPHA LIPOIC ACID PO Take 1 capsule by mouth daily.     aspirin EC 81 MG tablet Take 81 mg by mouth daily.     atorvastatin (LIPITOR) 40 MG tablet Take 1 tablet (40 mg total) by mouth daily at 6 PM. (Patient taking differently: Take 20 mg by mouth daily.) 30 tablet 0   clopidogrel (PLAVIX) 75 MG tablet Take  75 mg by mouth daily.     FENUGREEK PO Take 2 tablets by mouth daily as needed (muscle stiffness).     losartan (COZAAR) 100 MG tablet Take 100 mg by mouth daily.     Vitamin D-Vitamin K (D3 + K2 PO) Take 1 tablet by mouth daily.       ROS: Constitutional: No fever or chills Vision: No changes in vision ENT: No difficulty swallowing CV: No chest pain Pulm: No SOB or wheezing GI: No nausea or vomiting GU: No urgency or inability to hold urine Skin: No poor wound healing Neurologic: No numbness or tingling Psychiatric: No depression or anxiety Heme: No bruising Allergic: No reaction to medications or food   Exam: Blood pressure 123/78, pulse 89, temperature 98.5 F (36.9 C), temperature source Oral, resp. rate 18, height 5\' 6"  (1.676 m), weight 66.5 kg, SpO2 95%. General: No acute distress Orientation: Awake alert and oriented x 3 Mood and Affect: Cooperative and pleasant Gait: Unable to assess due to her fracture Coordination and balance: Within normal limits  Right lower extremity: Leg is shortened and externally rotated.  No skin lesions noted.  She has warm well-perfused foot with 2+ DP pulses.  She has active dorsiflexion plantarflexion of her foot and ankle.  She has a no deformity through the knee or ankle.  Left lower extremity: Skin without lesions. No tenderness to palpation. Full painless ROM, full strength in each muscle groups without evidence of instability.   Medical Decision  Making: Data: Imaging: X-rays of the right hip show a comminuted right intertrochanteric femur fracture with varus angulation.  Labs:  Results for orders placed or performed during the hospital encounter of 02/15/23 (from the past 24 hour(s))  Comprehensive metabolic panel     Status: Abnormal   Collection Time: 02/15/23  2:00 AM  Result Value Ref Range   Sodium 136 135 - 145 mmol/L   Potassium 3.8 3.5 - 5.1 mmol/L   Chloride 103 98 - 111 mmol/L   CO2 24 22 - 32 mmol/L   Glucose, Bld 175 (H) 70 - 99 mg/dL   BUN 16 8 - 23 mg/dL   Creatinine, Ser 4.01 0.44 - 1.00 mg/dL   Calcium 9.7 8.9 - 02.7 mg/dL   Total Protein 6.5 6.5 - 8.1 g/dL   Albumin 3.6 3.5 - 5.0 g/dL   AST 16 15 - 41 U/L   ALT 16 0 - 44 U/L   Alkaline Phosphatase 76 38 - 126 U/L   Total Bilirubin 0.8 0.3 - 1.2 mg/dL   GFR, Estimated >25 >36 mL/min   Anion gap 9 5 - 15  CBC with Differential     Status: Abnormal   Collection Time: 02/15/23  2:00 AM  Result Value Ref Range   WBC 9.8 4.0 - 10.5 K/uL   RBC 3.87 3.87 - 5.11 MIL/uL   Hemoglobin 10.8 (L) 12.0 - 15.0 g/dL   HCT 64.4 (L) 03.4 - 74.2 %   MCV 86.3 80.0 - 100.0 fL   MCH 27.9 26.0 - 34.0 pg   MCHC 32.3 30.0 - 36.0 g/dL   RDW 59.5 63.8 - 75.6 %   Platelets 268 150 - 400 K/uL   nRBC 0.0 0.0 - 0.2 %   Neutrophils Relative % 60 %   Neutro Abs 5.9 1.7 - 7.7 K/uL   Lymphocytes Relative 29 %   Lymphs Abs 2.9 0.7 - 4.0 K/uL   Monocytes Relative 9 %   Monocytes Absolute 0.9 0.1 -  1.0 K/uL   Eosinophils Relative 1 %   Eosinophils Absolute 0.1 0.0 - 0.5 K/uL   Basophils Relative 0 %   Basophils Absolute 0.0 0.0 - 0.1 K/uL   Immature Granulocytes 1 %   Abs Immature Granulocytes 0.05 0.00 - 0.07 K/uL  Protime-INR     Status: None   Collection Time: 02/15/23  2:00 AM  Result Value Ref Range   Prothrombin Time 14.0 11.4 - 15.2 seconds   INR 1.1 0.8 - 1.2  Urinalysis, Routine w reflex microscopic -Urine, Clean Catch     Status: Abnormal   Collection Time:  02/15/23  2:04 AM  Result Value Ref Range   Color, Urine YELLOW YELLOW   APPearance HAZY (A) CLEAR   Specific Gravity, Urine 1.013 1.005 - 1.030   pH 6.0 5.0 - 8.0   Glucose, UA 50 (A) NEGATIVE mg/dL   Hgb urine dipstick NEGATIVE NEGATIVE   Bilirubin Urine NEGATIVE NEGATIVE   Ketones, ur NEGATIVE NEGATIVE mg/dL   Protein, ur NEGATIVE NEGATIVE mg/dL   Nitrite NEGATIVE NEGATIVE   Leukocytes,Ua MODERATE (A) NEGATIVE   RBC / HPF 0-5 0 - 5 RBC/hpf   WBC, UA 21-50 0 - 5 WBC/hpf   Bacteria, UA RARE (A) NONE SEEN   Squamous Epithelial / HPF 0-5 0 - 5 /HPF  HIV Antibody (routine testing w rflx)     Status: None   Collection Time: 02/15/23  6:14 AM  Result Value Ref Range   HIV Screen 4th Generation wRfx Non Reactive Non Reactive  Glucose, capillary     Status: Abnormal   Collection Time: 02/15/23 10:22 AM  Result Value Ref Range   Glucose-Capillary 166 (H) 70 - 99 mg/dL     Imaging or Labs ordered: None  Medical history and chart was reviewed and case discussed with medical provider.  Assessment/Plan: 67 year old female with a ground-level fall with right intertrochanteric femur fracture.  Due to the unstable nature of her injury I recommend proceeding with intramedullary nailing of the right hip.  Risks and benefits were discussed with the patient and her husband.  Risks include but not limited to bleeding, infection, malunion, nonunion, hardware failure, hardware irritation, nerve or blood vessel injury, DVT, even the possibility anesthetic complications.  They agreed to proceed with surgery and consent was obtained.  Roby Lofts, MD [facility] [phone] (office) [email or web address]

## 2023-02-15 NOTE — ED Notes (Signed)
Trauma Response Nurse Documentation   [name] is a 67 y.o. female arriving to [facility] via [facility]  On Eliquis (apixaban) daily. Trauma was activated as a Level 2 by Rande Brunt based on the following trauma criteria Elderly patients > 65 with head trauma on anti-coagulation (excluding ASA).  Patient cleared for CT by Dr. Madilyn Hook. Pt transported to CT with trauma response nurse present to monitor. RN remained with the patient throughout their absence from the department for clinical observation.   GCS 15.  History   Past Medical History:  Diagnosis Date   Anxiety    a. with claustrophobia   Arthritis    "right hand; maybe just starting in my left hand" (04/21/2012)   Asthma    CAD (coronary artery disease)    a. PCI [place] 2004. b. Angina - balloon angioplasty to LAD 04/21/12 (vessel too small for stent).   Carotid stenosis    a. 11/2011 critical R, ~30% L by angiogram;  b. 04/2012 s/p R CEA.   Cataract    left   Chronic lower back pain    Depression    a. occasionally takes st john's wart   Diabetes mellitus type 2, uncontrolled (HCC)    Dysrhythmia    Embolic stroke (HCC)    a. 11/2011 MRI: mult small b/l infarcts c/w emboli;  b. 11/2011 MRA neck: high grade RICA stenosis;  c. 6/20013 TEE: EF 55-60% with mild LVH, severe MAC, < 1 cm calc mobile structure on the MV annulus, ? 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 Thought was RICA stenosis did not explain the b/l small strokes; concern for emboli from mobile MV struct;  d. 01/2012 Event Mon:  no AFib   History of echocardiogram    Echo 12/16: mild LVH, EF 55-60%, no RWMA, Gr 2 DD, trivial AI, MAC, mild MR, mild LAE   History of kidney stones    History of nuclear stress test    a. Myoview 12/16: EF 46%, no ischemia, Intermediate Risk (low EF)   HLD (hyperlipidemia)    a. 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 visual changes   Myocardial  infarction [facility])    prior to 2013   Peripheral neuropathy    Refusal of blood transfusions as patient is Jehovah's Witness    Stroke (HCC) 2015- dec   mini stroke   Uncontrolled type 2 diabetes mellitus with circulatory disorder, with long-term current use of insulin (HCC) 05/10/2015     Past Surgical History:  Procedure Laterality Date   ANKLE SURGERY  2005   after fall down stairs, left with screws and plate in place   CARDIAC CATHETERIZATION  2004   WNL, no significant coronary disease   CARDIAC CATHETERIZATION  12/19/11   CAROTID ANGIOGRAM Bilateral 12/19/2011   Procedure: CAROTID ANGIOGRAM;  Surgeon: Nada Libman, MD;  Location: [facility] CATH LAB;  Service: Cardiovascular;  Laterality: Bilateral;   CAROTID ENDARTERECTOMY     CARPAL TUNNEL RELEASE  ~ 2005   bilateral   CATARACT EXTRACTION W/ INTRAOCULAR LENS IMPLANT  2012   right   CATARACT EXTRACTION W/PHACO Left 10/2015   Dr Noel Gerold at [facility]   CORONARY ANGIOPLASTY  04/21/2012   95% stenosis mid LAD, vessel not large enough for stent, rec plavix/ASA for 1 mo   CORONARY ANGIOPLASTY WITH STENT PLACEMENT  2002   x2   ENDARTERECTOMY  05/09/2012   Procedure: ENDARTERECTOMY CAROTID;  Surgeon: Nada Libman, MD;  Location: [facility] OR;  Service: Vascular;  Laterality: Right;  Right carotd endarterectomy with bovine patch angioplasty   ENDARTERECTOMY Left 06/23/2015   Procedure: ENDARTERECTOMY CAROTID;  Surgeon: Nada Libman, MD;  Location: [facility] OR;  Service: Vascular;  Laterality: Left;   EYE SURGERY Left 04/2015   laser surgery,blood vessels rupturing   holter monitor  2013   WNL   KIDNEY STONE SURGERY     several times   LITHOTRIPSY     LOOP RECORDER INSERTION N/A 07/22/2019   Procedure: LOOP RECORDER INSERTION;  Surgeon: Marcina Millard, MD;  Location: [facility] INVASIVE CV LAB;  Service: Cardiovascular;  Laterality: N/A;   PARS PLANA VITRECTOMY Left 04/2015   [facility])   [facility] ANGIOPLASTY Left 06/23/2015   Procedure: PATCH  ANGIOPLASTY USING 1cm x 4cm BOVINE PERICARDIAL PATCH;  Surgeon: Nada Libman, MD;  Location: [facility] OR;  Service: Vascular;  Laterality: Left;   PERCUTANEOUS CORONARY STENT INTERVENTION (PCI-S) N/A 04/21/2012   Procedure: PERCUTANEOUS CORONARY STENT INTERVENTION (PCI-S);  Surgeon: Kathleene Hazel, MD;  Location: [facility] CATH LAB;  Service: Cardiovascular;  Laterality: N/A;   TEE WITHOUT CARDIOVERSION  12/14/2011   Procedure: TRANSESOPHAGEAL ECHOCARDIOGRAM (TEE);  Surgeon: Laurey Morale, MD;  Location: [facility] ENDOSCOPY;  Service: Cardiovascular;  Laterality: N/A;   US ECHOCARDIOGRAPHY  2002   normal LV fxn, mild LVH, borderline L atrial size       Initial Focused Assessment (If applicable, or please see trauma documentation): Airway-- intact, no visible obstruction Breathing-- spontaneous, unlabored Circulation-- no apparent bleeding noted  CT's Completed:   CT Head and CT C-Spine   Interventions:  See event summary  Plan for disposition:  Admission to floor   Consults completed:  Orthopaedic Surgeon at [phone].  Event Summary: Patient brought in by [facility], patient with mechanical fall this evening. Patient with outward rotation of right hip. On arrival patient transferred from EMS stretcher to hospital stretcher. Manual BP obtained. Lab work obtained. Patient with deformity to right hip. Hematoma to forehead noted. Xray chest, right hip completed. Patient to CT with TRN and Primary RN. CT head, c-spine completed.  Patient back to trauma bay at this time.  MTP Summary (If applicable):  N/A  Bedside handoff with ED RN Tiffany.    [name]  Trauma Response RN  Please call TRN at [phone] for further assistance.

## 2023-02-15 NOTE — H&P (Signed)
History and Physical    [name] NWG:[id number] DOB: [birth year] DOA: 02/15/2023  DOS: the patient was seen and examined on 02/15/2023  PCP: Sherry Mina, MD   Patient coming from: Home transferred from outside ED  I have personally briefly reviewed patient's old medical records in [facility]  [name], a 67 y/o woman with DM, HLD, HTN had a mechanical fall at home striking he head and right hip. She was seen at outside ED where she had nl CT head, CT c-spine. Hip xray revealed comminuted intratrochanteric fx right. She was transferred to [facility] for ortho consult and repair.    ED Course: 97.9  142/105  HR 93 RR 20. Patient at admission comfortable and in no distress. Lab: glucose 175, CBCD nl, U/A hazy, LE large, WBC 21-50. Hip films reveal right intratrochanterc fx. TRH accepted in transfer and to admit for medical management.   Review of Systems:  Review of Systems  Constitutional: Negative.   HENT: Negative.    Eyes: Negative.   Respiratory: Negative.    Cardiovascular: Negative.   Gastrointestinal: Negative.   Genitourinary:  Positive for dysuria.  Musculoskeletal:  Positive for joint pain.       Right hip pain  Skin: Negative.   Neurological: Negative.   Endo/Heme/Allergies: Negative.   Psychiatric/Behavioral: Negative.      Past Medical History:  Diagnosis Date   Anxiety    a. with claustrophobia   Arthritis    "right hand; maybe just starting in my left hand" (04/21/2012)   Asthma    CAD (coronary artery disease)    a. PCI [place] 2004. b. Angina - balloon angioplasty to LAD 04/21/12 (vessel too small for stent).   Carotid stenosis    a. 11/2011 critical R, ~30% L by angiogram;  b. 04/2012 s/p R CEA.   Cataract    left   Chronic lower back pain    Depression    a. occasionally takes st john's wart   Diabetes mellitus type 2, uncontrolled    Dysrhythmia    Embolic stroke (HCC)    a. 11/2011 MRI: mult small b/l infarcts c/w emboli;  b. 11/2011 MRA  neck: high grade RICA stenosis;  c. 6/20013 TEE: EF 55-60% with mild LVH, severe MAC, < 1 cm calc mobile structure on the MV annulus, ? 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 Thought was RICA stenosis did not explain the b/l small strokes; concern for emboli from mobile MV struct;  d. 01/2012 Event Mon:  no AFib   History of echocardiogram    Echo 12/16: mild LVH, EF 55-60%, no RWMA, Gr 2 DD, trivial AI, MAC, mild MR, mild LAE   History of kidney stones    History of nuclear stress test    a. Myoview 12/16: EF 46%, no ischemia, Intermediate Risk (low EF)   HLD (hyperlipidemia)    a. 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 visual changes   Myocardial infarction [facility])    prior to 2013   Peripheral neuropathy    Refusal of blood transfusions as patient is Jehovah's Witness    Stroke (HCC) 2015- dec   mini stroke   Uncontrolled type 2 diabetes mellitus with circulatory disorder, with long-term current use of insulin 05/10/2015    Past Surgical History:  Procedure Laterality Date   ANKLE SURGERY  2005   after fall down stairs, left with screws and  plate in place   CARDIAC CATHETERIZATION  2004   WNL, no significant coronary disease   CARDIAC CATHETERIZATION  12/19/11   CAROTID ANGIOGRAM Bilateral 12/19/2011   Procedure: CAROTID ANGIOGRAM;  Surgeon: Sherry Libman, MD;  Location: [facility] CATH LAB;  Service: Cardiovascular;  Laterality: Bilateral;   CAROTID ENDARTERECTOMY     CARPAL TUNNEL RELEASE  ~ 2005   bilateral   CATARACT EXTRACTION W/ INTRAOCULAR LENS IMPLANT  2012   right   CATARACT EXTRACTION W/PHACO Left 10/2015   Dr [name] at [facility]   CORONARY ANGIOPLASTY  04/21/2012   95% stenosis mid LAD, vessel not large enough for stent, rec plavix/ASA for 1 mo   CORONARY ANGIOPLASTY WITH STENT PLACEMENT  2002   x2   ENDARTERECTOMY  05/09/2012   Procedure: ENDARTERECTOMY CAROTID;  Surgeon: Sherry Libman, MD;  Location: [facility] OR;   Service: Vascular;  Laterality: Right;  Right carotd endarterectomy with bovine patch angioplasty   ENDARTERECTOMY Left 06/23/2015   Procedure: ENDARTERECTOMY CAROTID;  Surgeon: Sherry Libman, MD;  Location: [facility] OR;  Service: Vascular;  Laterality: Left;   EYE SURGERY Left 04/2015   laser surgery,blood vessels rupturing   holter monitor  2013   WNL   KIDNEY STONE SURGERY     several times   LITHOTRIPSY     LOOP RECORDER INSERTION N/A 07/22/2019   Procedure: LOOP RECORDER INSERTION;  Surgeon: Sherry Millard, MD;  Location: [facility] INVASIVE CV LAB;  Service: Cardiovascular;  Laterality: N/A;   PARS PLANA VITRECTOMY Left 04/2015   [facility] Dr [name])   [facility] ANGIOPLASTY Left 06/23/2015   Procedure: PATCH ANGIOPLASTY USING 1cm x 4cm BOVINE PERICARDIAL PATCH;  Surgeon: Sherry Libman, MD;  Location: [facility] OR;  Service: Vascular;  Laterality: Left;   PERCUTANEOUS CORONARY STENT INTERVENTION (PCI-S) N/A 04/21/2012   Procedure: PERCUTANEOUS CORONARY STENT INTERVENTION (PCI-S);  Surgeon: Sherry Hazel, MD;  Location: [facility] CATH LAB;  Service: Cardiovascular;  Laterality: N/A;   TEE WITHOUT CARDIOVERSION  12/14/2011   Procedure: TRANSESOPHAGEAL ECHOCARDIOGRAM (TEE);  Surgeon: Sherry Morale, MD;  Location: [facility] ENDOSCOPY;  Service: Cardiovascular;  Laterality: N/A;   US ECHOCARDIOGRAPHY  2002   normal LV fxn, mild LVH, borderline L atrial size    Soc Hx - married 50 years as of Sept 4th. Four children, 12 grands, two greats. Homemaker. Lives with spouse.    reports that she quit smoking about 50 years ago. Her smoking use included cigarettes. She has never used smokeless tobacco. She reports that she does not drink alcohol and does not use drugs.  Allergies  Allergen Reactions   Other Shortness Of Breath    No blood or blood products   Penicillins Itching and Rash   Toradol [Ketorolac Tromethamine] Other (See Comments)    Panic attack    Imdur [Isosorbide Nitrate] Other (See Comments)     Headaches    Latex Rash    Rash on all points of contact (thick, heavy itching rash)   Metformin And Related Diarrhea and Nausea And Vomiting    Family History  Problem Relation Age of Onset   Heart disease Mother        valve problems   Stroke Mother    Hypertension Mother    Heart disease Sister        valve problems   Heart disease Brother    Diabetes Father    Hypertension Father    AAA (abdominal aortic aneurysm) Father    Cancer Neg Hx  Prior to Admission medications   Medication Sig Start Date End Date Taking? Authorizing Provider  ALPHA LIPOIC ACID PO Take 1 capsule by mouth daily.   Yes [provider]  aspirin EC 81 MG tablet Take 81 mg by mouth daily.   Yes [provider]  atorvastatin (LIPITOR) 40 MG tablet Take 1 tablet (40 mg total) by mouth daily at 6 PM. Patient taking differently: Take 20 mg by mouth daily. 03/18/19 04/06/23 Yes Pyreddy, Vivien Rota, MD  clopidogrel (PLAVIX) 75 MG tablet Take 75 mg by mouth daily. 04/09/19  Yes [provider]  FENUGREEK PO Take 2 tablets by mouth daily as needed (muscle stiffness).   Yes [provider]  losartan (COZAAR) 100 MG tablet Take 100 mg by mouth daily.   Yes [provider]  Vitamin D-Vitamin K (D3 + K2 PO) Take 1 tablet by mouth daily.   Yes [provider]    Physical Exam: Vitals:   02/15/23 0300 02/15/23 0315 02/15/23 0443 02/15/23 0811  BP: (!) 178/93 (!) 183/95 (!) 142/105 106/61  Pulse: 94 98 93 90  Resp: 12 15 20 17   Temp:   97.9 F (36.6 C) 98 F (36.7 C)  TempSrc:   Oral Oral  SpO2: 100% 99% 100% 98%  Weight:   66.5 kg   Height:   5\' 6"  (1.676 m)     Physical Exam Vitals and nursing note reviewed.  Constitutional:      General: She is not in acute distress.    Appearance: Normal appearance. She is normal weight. She is not ill-appearing or toxic-appearing.  HENT:     Head: Normocephalic. Contusion and right periorbital erythema present.       Nose: Nose normal.     Mouth/Throat:     Mouth: Mucous membranes are moist.     Pharynx: Oropharynx is clear.  Eyes:     Extraocular Movements: Extraocular movements intact.     Conjunctiva/sclera: Conjunctivae normal.     Pupils: Pupils are equal, round, and reactive to light.  Neck:     Vascular: No carotid bruit.  Cardiovascular:     Rate and Rhythm: Normal rate and regular rhythm.     Pulses: Normal pulses.     Heart sounds: Normal heart sounds. No murmur heard. Pulmonary:     Effort: Pulmonary effort is normal.     Breath sounds: Normal breath sounds. No wheezing or rales.  Abdominal:     General: Bowel sounds are normal.     Palpations: Abdomen is soft.  Musculoskeletal:     Cervical back: Normal range of motion and neck supple. No rigidity or tenderness.     Comments: Limited movement right LE 2/2 pain, foot rotated outward  Skin:    General: Skin is warm and dry.     Findings: Bruising present.     Comments: Large bruise right forehead  Neurological:     General: No focal deficit present.     Mental Status: She is alert and oriented to person, place, and time.     Cranial Nerves: No cranial nerve deficit.  Psychiatric:        Mood and Affect: Mood normal.        Behavior: Behavior normal.      Labs on Admission: I have personally reviewed following labs and imaging studies  CBC: Recent Labs  Lab 02/15/23 0200  WBC 9.8  NEUTROABS 5.9  HGB 10.8*  HCT 33.4*  MCV 86.3  PLT 268  Basic Metabolic Panel: Recent Labs  Lab 02/15/23 0200  NA 136  K 3.8  CL 103  CO2 24  GLUCOSE 175*  BUN 16  CREATININE 0.90  CALCIUM 9.7   GFR: Estimated Creatinine Clearance: 56.8 mL/min (by C-G formula based on SCr of 0.9 mg/dL). Liver Function Tests: Recent Labs  Lab 02/15/23 0200  AST 16  ALT 16  ALKPHOS 76  BILITOT 0.8  PROT 6.5  ALBUMIN 3.6   No results for input(s): "LIPASE", "AMYLASE" in the last 168 hours. No results for input(s): "AMMONIA" in the  last 168 hours. Coagulation Profile: Recent Labs  Lab 02/15/23 0200  INR 1.1   Cardiac Enzymes: No results for input(s): "CKTOTAL", "CKMB", "CKMBINDEX", "TROPONINI" in the last 168 hours. BNP (last 3 results) No results for input(s): "PROBNP" in the last 8760 hours. HbA1C: No results for input(s): "HGBA1C" in the last 72 hours. CBG: No results for input(s): "GLUCAP" in the last 168 hours. Lipid Profile: No results for input(s): "CHOL", "HDL", "LDLCALC", "TRIG", "CHOLHDL", "LDLDIRECT" in the last 72 hours. Thyroid Function Tests: No results for input(s): "TSH", "T4TOTAL", "FREET4", "T3FREE", "THYROIDAB" in the last 72 hours. Anemia Panel: No results for input(s): "VITAMINB12", "FOLATE", "FERRITIN", "TIBC", "IRON", "RETICCTPCT" in the last 72 hours. Urine analysis:    Component Value Date/Time   COLORURINE YELLOW 02/15/2023 0204   APPEARANCEUR HAZY (A) 02/15/2023 0204   LABSPEC 1.013 02/15/2023 0204   PHURINE 6.0 02/15/2023 0204   GLUCOSEU 50 (A) 02/15/2023 0204   HGBUR NEGATIVE 02/15/2023 0204   BILIRUBINUR NEGATIVE 02/15/2023 0204   KETONESUR NEGATIVE 02/15/2023 0204   PROTEINUR NEGATIVE 02/15/2023 0204   UROBILINOGEN 0.2 12/10/2013 2029   NITRITE NEGATIVE 02/15/2023 0204   LEUKOCYTESUR MODERATE (A) 02/15/2023 0204    Radiological Exams on Admission: I have personally reviewed images DG Knee Right Port  Result Date: 02/15/2023 CLINICAL DATA:  [id number]. Right hip fracture. Check for right knee fracture. EXAM: PORTABLE RIGHT KNEE - 1-2 VIEW COMPARISON:  None Available. FINDINGS: There is osteopenia without evidence of fractures. No suprapatellar bursal effusion is seen. Patchy calcific plaques in the distal femoral, popliteal and popliteal trifurcation arteries with otherwise unremarkable soft tissues. There is mild narrowing of the medial femorotibial joint with trace marginal spurring. Other joint spaces are maintained in the knee. There is no erosive arthropathy. Interosseous  alignment is normal. IMPRESSION: 1. Osteopenia and mild degenerative change without evidence of fractures. 2. Peripheral vascular disease. Electronically Signed   By: Almira Bar M.D.   On: 02/15/2023 07:42   DG Hip Unilat W or Wo Pelvis 2-3 Views Right  Result Date: 02/15/2023 CLINICAL DATA:  Fall fall, right hip pain EXAM: DG HIP (WITH OR WITHOUT PELVIS) 2-3V RIGHT COMPARISON:  None Available. FINDINGS: Acute, comminuted intratrochanteric fracture of the right hip noted with avulsion of the lesser trochanter and mild varus angulation of the distal fracture fragment. Femoral head is still seated within the right acetabulum. Pelvis and visualized left hip are intact. Vascular calcifications are noted. IMPRESSION: 1. Acute, comminuted intratrochanteric fracture of the right hip. Electronically Signed   By: Helyn Numbers M.D.   On: 02/15/2023 02:45   DG Chest Port 1 View  Result Date: 02/15/2023 CLINICAL DATA:  Fall, chest trauma EXAM: PORTABLE CHEST 1 VIEW COMPARISON:  03/18/2019 FINDINGS: Lungs are well expanded, symmetric, and clear. No pneumothorax or pleural effusion. Cardiac size within normal limits. Calcification of the mitral valve annulus noted. Pulmonary vascularity is normal. Osseous structures are age-appropriate. No acute bone  abnormality. IMPRESSION: No active disease. Electronically Signed   By: Helyn Numbers M.D.   On: 02/15/2023 02:45   CT Head Wo Contrast  Result Date: 02/15/2023 CLINICAL DATA:  Fall, on blood thinners, head and neck trauma EXAM: CT HEAD WITHOUT CONTRAST CT CERVICAL SPINE WITHOUT CONTRAST TECHNIQUE: Multidetector CT imaging of the head and cervical spine was performed following the standard protocol without intravenous contrast. Multiplanar CT image reconstructions of the cervical spine were also generated. RADIATION DOSE REDUCTION: This exam was performed according to the departmental dose-optimization program which includes automated exposure control, adjustment  of the mA and/or kV according to patient size and/or use of iterative reconstruction technique. COMPARISON:  03/17/2019 CT head, no prior CT cervical spine FINDINGS: CT HEAD FINDINGS Brain: No evidence of acute infarct, hemorrhage, mass, mass effect, or midline shift. No hydrocephalus or extra-axial fluid collection. Age-related cerebral atrophy. Encephalomalacia in the right occipital lobe, unchanged. Remote right thalamic lacunar infarct. Vascular: No hyperdense vessel. Atherosclerotic calcifications in the intracranial carotid and vertebral arteries. Skull: Negative for fracture or focal lesion. Right frontal scalp hematoma. Sinuses/Orbits: Mucosal thickening in the ethmoid air cells. Status post bilateral lens replacements. Other: The mastoid air cells are well aerated. CT CERVICAL SPINE FINDINGS Alignment: No traumatic listhesis. Skull base and vertebrae: No acute fracture or suspicious osseous lesion. Soft tissues and spinal canal: No prevertebral fluid or swelling. No visible canal hematoma. Disc levels: Degenerative changes in the cervical spine. Mild spinal canal stenosis at C5-C6 and C6-C7. Upper chest: No focal pulmonary opacity or pleural effusion. IMPRESSION: 1. No acute intracranial process. Right frontal scalp hematoma. 2. No acute fracture or traumatic listhesis in the cervical spine. Electronically Signed   By: Wiliam Ke M.D.   On: 02/15/2023 02:42   CT Cervical Spine Wo Contrast  Result Date: 02/15/2023 CLINICAL DATA:  Fall, on blood thinners, head and neck trauma EXAM: CT HEAD WITHOUT CONTRAST CT CERVICAL SPINE WITHOUT CONTRAST TECHNIQUE: Multidetector CT imaging of the head and cervical spine was performed following the standard protocol without intravenous contrast. Multiplanar CT image reconstructions of the cervical spine were also generated. RADIATION DOSE REDUCTION: This exam was performed according to the departmental dose-optimization program which includes automated exposure  control, adjustment of the mA and/or kV according to patient size and/or use of iterative reconstruction technique. COMPARISON:  03/17/2019 CT head, no prior CT cervical spine FINDINGS: CT HEAD FINDINGS Brain: No evidence of acute infarct, hemorrhage, mass, mass effect, or midline shift. No hydrocephalus or extra-axial fluid collection. Age-related cerebral atrophy. Encephalomalacia in the right occipital lobe, unchanged. Remote right thalamic lacunar infarct. Vascular: No hyperdense vessel. Atherosclerotic calcifications in the intracranial carotid and vertebral arteries. Skull: Negative for fracture or focal lesion. Right frontal scalp hematoma. Sinuses/Orbits: Mucosal thickening in the ethmoid air cells. Status post bilateral lens replacements. Other: The mastoid air cells are well aerated. CT CERVICAL SPINE FINDINGS Alignment: No traumatic listhesis. Skull base and vertebrae: No acute fracture or suspicious osseous lesion. Soft tissues and spinal canal: No prevertebral fluid or swelling. No visible canal hematoma. Disc levels: Degenerative changes in the cervical spine. Mild spinal canal stenosis at C5-C6 and C6-C7. Upper chest: No focal pulmonary opacity or pleural effusion. IMPRESSION: 1. No acute intracranial process. Right frontal scalp hematoma. 2. No acute fracture or traumatic listhesis in the cervical spine. Electronically Signed   By: Wiliam Ke M.D.   On: 02/15/2023 02:42    EKG: I have personally reviewed EKG: sinus rhythm, LVH, no acute changes  Assessment/Plan Principal Problem:   Hip fracture (HCC) Active Problems:   UTI (urinary tract infection)   CAD (coronary artery disease)   DM (diabetes mellitus), type 2 (HCC)   HLD (hyperlipidemia)   Asthma   HTN (hypertension)    Assessment and Plan: * Hip fracture (HCC) Patient with traumatic intratrochanteric fx. Ortho to consult: ORIF vs THR. She has been taking Plavix after CVA - now on hold.   Plan Ortho/Anesth to recommend  timing of surgery - plavix not taken today and will not be ordered  Pain mgt - APAP 500 mg QID and ketorolac 15 mg IV q6  Dilaudid for breakthru uncontrolled pain.  UTI (urinary tract infection) Patient reports frequent UTIs, last in July that responded to septra. Now with pyuria, scant bacteria, minimal discomfort.  Plan Septra DS bid x 5 days  HTN (hypertension) BP elevated at admission - 2/2 acute pain.  Plan Continue home regimen ARB  Asthma Patient with h/o asthma, mostly quiescent. Lungs clear on exam. Nl respiratory effort  Plan Albuterol MDI q4 prn wheezing  HLD (hyperlipidemia) On Statin therapy  Plan Continue statin  Lipid panel in AM  DM (diabetes mellitus), type 2 (HCC) Patient takes [place] intermittently: sometimes before meals like a sliding scale or if she has forgotten to take it she will take 20u before bed. Last A1C in EPIC 2021 12.8%. She says she sends of a "sample" periodically for checking A1C but hasn't heard results recently.  Plan A1C  Tresiba 15 u qAM  Sliding scale  DM educator consult  CAD (coronary artery disease) Stable with no complaint of chest pain. EKG with LVH, no acute changes.  Plan Continue anti-lipemic medication  Clear for surgery       DVT prophylaxis: SQ Heparin Code Status: Full Code Family Communication: husband present during exam. Answered all questions  Disposition Plan: home when stable  Consults called:  ortho  Admission status: Inpatient, Med-Surg   Illene Regulus, MD [facility] 02/15/2023, 9:39 AM

## 2023-02-15 NOTE — ED Provider Notes (Signed)
[place] EMERGENCY DEPARTMENT AT [facility] Provider Note   CSN: [id number] Arrival date & time: 02/15/23  0154     History  Chief Complaint  Patient presents with   Marletta Lor    [name] is a 67 y.o. female.  The history is provided by the patient, the EMS personnel and medical records.  [name] is a 67 y.o. female who presents to the Emergency Department complaining of fall.  She presents to the emergency department by EMS for evaluation of fall on thinners.  She was leaving the bathroom and had the lights off and she misjudged and fell, landing on her right hip.  She did strike her head.  No loss of consciousness.  She complains of severe pain to her right hip.  She received 125 mcg of fentanyl by EMS prior to ED arrival.  She does take aspirin and Plavix.  Has a history of diabetes, CVA, paroxysmal A-fib, coronary artery disease.     Home Medications Prior to Admission medications   Medication Sig Start Date End Date Taking? Authorizing Provider  Ascorbic Acid (VITAMIN C WITH ROSE HIPS) 500 MG tablet Take 500 mg by mouth every evening.    [provider]  aspirin EC 81 MG tablet Take 81 mg by mouth daily.    [provider]  atorvastatin (LIPITOR) 40 MG tablet Take 1 tablet (40 mg total) by mouth daily at 6 PM. 03/18/19 07/15/20  Pyreddy, Vivien Rota, MD  bisoprolol (ZEBETA) 10 MG tablet Take 10 mg by mouth daily. Thinks she might be taking not sure    [provider]  citalopram (CELEXA) 10 MG tablet Take 10 mg by mouth every evening. 06/29/19   [provider]  clopidogrel (PLAVIX) 75 MG tablet Take 75 mg by mouth every evening. 04/09/19   [provider]  insulin NPH-regular Human (70-30) 100 UNIT/ML injection Inject into the skin.    [provider]  losartan (COZAAR) 50 MG tablet Take 50 mg by mouth every evening. 06/18/19   [provider]  Multiple Vitamins-Minerals (EYE SUPPORT PO) Take 1  capsule by mouth every evening. Saffron MD Eye Health Support    [provider]  sodium chloride (OCEAN) 0.65 % SOLN nasal spray Place 1 spray into both nostrils 4 (four) times daily as needed for congestion.    [provider]  Tetrahydroz-Dextran-PEG-Povid (EYE DROPS ADVANCED RELIEF OP) Place 1 drop into both eyes 3 (three) times daily as needed (dry/irritated eyes.). [employer] Advanced Eye Relief Dry Eye Lubricant Eye Drops    [provider]  Zinc 50 MG TABS Take 50 mg by mouth every evening.    [provider]      Allergies    Other, Penicillins, Toradol [ketorolac tromethamine], Isosorbide nitrate, Latex, and Metformin and related    Review of Systems   Review of Systems  All other systems reviewed and are negative.   Physical Exam Updated Vital Signs BP (!) 142/105 (BP Location: Right Arm)   Pulse 93   Temp 97.9 F (36.6 C) (Oral)   Resp 20   Ht 5\' 6"  (1.676 m)   Wt 66.5 kg   SpO2 100%   BMI 23.66 kg/m  Physical Exam Vitals and nursing note reviewed.  Constitutional:      Appearance: She is well-developed.  HENT:     Head: Normocephalic.     Comments: Hematoma to the right forehead Cardiovascular:     Rate and  Rhythm: Normal rate and regular rhythm.     Heart sounds: No murmur heard. Pulmonary:     Effort: Pulmonary effort is normal. No respiratory distress.     Breath sounds: Normal breath sounds.  Abdominal:     Palpations: Abdomen is soft.     Tenderness: There is no abdominal tenderness. There is no guarding or rebound.  Musculoskeletal:     Comments: 2+ DP pulses bilaterally.  There is significant tenderness to palpation over the right lateral hip with palpable deformity.  Right lower extremity is externally rotated and shortened.  Skin:    General: Skin is warm and dry.  Neurological:     Mental Status: She is alert and oriented to person, place, and time.  Psychiatric:        Behavior: Behavior normal.     ED  Results / Procedures / Treatments   Labs (all labs ordered are listed, but only abnormal results are displayed) Labs Reviewed  COMPREHENSIVE METABOLIC PANEL - Abnormal; Notable for the following components:      Result Value   Glucose, Bld 175 (*)    All other components within normal limits  CBC WITH DIFFERENTIAL/PLATELET - Abnormal; Notable for the following components:   Hemoglobin 10.8 (*)    HCT 33.4 (*)    All other components within normal limits  URINALYSIS, ROUTINE W REFLEX MICROSCOPIC - Abnormal; Notable for the following components:   APPearance HAZY (*)    Glucose, UA 50 (*)    Leukocytes,Ua MODERATE (*)    Bacteria, UA RARE (*)    All other components within normal limits  PROTIME-INR  HIV ANTIBODY (ROUTINE TESTING W REFLEX)    EKG EKG Interpretation Date/Time:  Friday February 15 2023 02:21:08 EDT Ventricular Rate:  93 PR Interval:  182 QRS Duration:  117 QT Interval:  410 QTC Calculation: 510 R Axis:   71  Text Interpretation: Sinus rhythm Ventricular premature complex Probable left ventricular hypertrophy Abnormal inferior Q waves Anterior ST elevation, probably due to LVH Prolonged QT interval Confirmed by Tilden Fossa [phone]) on 02/15/2023 2:57:57 AM  Radiology DG Hip Unilat W or Wo Pelvis 2-3 Views Right  Result Date: 02/15/2023 CLINICAL DATA:  Fall fall, right hip pain EXAM: DG HIP (WITH OR WITHOUT PELVIS) 2-3V RIGHT COMPARISON:  None Available. FINDINGS: Acute, comminuted intratrochanteric fracture of the right hip noted with avulsion of the lesser trochanter and mild varus angulation of the distal fracture fragment. Femoral head is still seated within the right acetabulum. Pelvis and visualized left hip are intact. Vascular calcifications are noted. IMPRESSION: 1. Acute, comminuted intratrochanteric fracture of the right hip. Electronically Signed   By: Helyn Numbers M.D.   On: 02/15/2023 02:45   DG Chest Port 1 View  Result Date: 02/15/2023 CLINICAL  DATA:  Fall, chest trauma EXAM: PORTABLE CHEST 1 VIEW COMPARISON:  03/18/2019 FINDINGS: Lungs are well expanded, symmetric, and clear. No pneumothorax or pleural effusion. Cardiac size within normal limits. Calcification of the mitral valve annulus noted. Pulmonary vascularity is normal. Osseous structures are age-appropriate. No acute bone abnormality. IMPRESSION: No active disease. Electronically Signed   By: Helyn Numbers M.D.   On: 02/15/2023 02:45   CT Head Wo Contrast  Result Date: 02/15/2023 CLINICAL DATA:  Fall, on blood thinners, head and neck trauma EXAM: CT HEAD WITHOUT CONTRAST CT CERVICAL SPINE WITHOUT CONTRAST TECHNIQUE: Multidetector CT imaging of the head and cervical spine was performed following the standard protocol without intravenous contrast. Multiplanar CT image reconstructions  of the cervical spine were also generated. RADIATION DOSE REDUCTION: This exam was performed according to the departmental dose-optimization program which includes automated exposure control, adjustment of the mA and/or kV according to patient size and/or use of iterative reconstruction technique. COMPARISON:  03/17/2019 CT head, no prior CT cervical spine FINDINGS: CT HEAD FINDINGS Brain: No evidence of acute infarct, hemorrhage, mass, mass effect, or midline shift. No hydrocephalus or extra-axial fluid collection. Age-related cerebral atrophy. Encephalomalacia in the right occipital lobe, unchanged. Remote right thalamic lacunar infarct. Vascular: No hyperdense vessel. Atherosclerotic calcifications in the intracranial carotid and vertebral arteries. Skull: Negative for fracture or focal lesion. Right frontal scalp hematoma. Sinuses/Orbits: Mucosal thickening in the ethmoid air cells. Status post bilateral lens replacements. Other: The mastoid air cells are well aerated. CT CERVICAL SPINE FINDINGS Alignment: No traumatic listhesis. Skull base and vertebrae: No acute fracture or suspicious osseous lesion. Soft  tissues and spinal canal: No prevertebral fluid or swelling. No visible canal hematoma. Disc levels: Degenerative changes in the cervical spine. Mild spinal canal stenosis at C5-C6 and C6-C7. Upper chest: No focal pulmonary opacity or pleural effusion. IMPRESSION: 1. No acute intracranial process. Right frontal scalp hematoma. 2. No acute fracture or traumatic listhesis in the cervical spine. Electronically Signed   By: Wiliam Ke M.D.   On: 02/15/2023 02:42   CT Cervical Spine Wo Contrast  Result Date: 02/15/2023 CLINICAL DATA:  Fall, on blood thinners, head and neck trauma EXAM: CT HEAD WITHOUT CONTRAST CT CERVICAL SPINE WITHOUT CONTRAST TECHNIQUE: Multidetector CT imaging of the head and cervical spine was performed following the standard protocol without intravenous contrast. Multiplanar CT image reconstructions of the cervical spine were also generated. RADIATION DOSE REDUCTION: This exam was performed according to the departmental dose-optimization program which includes automated exposure control, adjustment of the mA and/or kV according to patient size and/or use of iterative reconstruction technique. COMPARISON:  03/17/2019 CT head, no prior CT cervical spine FINDINGS: CT HEAD FINDINGS Brain: No evidence of acute infarct, hemorrhage, mass, mass effect, or midline shift. No hydrocephalus or extra-axial fluid collection. Age-related cerebral atrophy. Encephalomalacia in the right occipital lobe, unchanged. Remote right thalamic lacunar infarct. Vascular: No hyperdense vessel. Atherosclerotic calcifications in the intracranial carotid and vertebral arteries. Skull: Negative for fracture or focal lesion. Right frontal scalp hematoma. Sinuses/Orbits: Mucosal thickening in the ethmoid air cells. Status post bilateral lens replacements. Other: The mastoid air cells are well aerated. CT CERVICAL SPINE FINDINGS Alignment: No traumatic listhesis. Skull base and vertebrae: No acute fracture or suspicious  osseous lesion. Soft tissues and spinal canal: No prevertebral fluid or swelling. No visible canal hematoma. Disc levels: Degenerative changes in the cervical spine. Mild spinal canal stenosis at C5-C6 and C6-C7. Upper chest: No focal pulmonary opacity or pleural effusion. IMPRESSION: 1. No acute intracranial process. Right frontal scalp hematoma. 2. No acute fracture or traumatic listhesis in the cervical spine. Electronically Signed   By: Wiliam Ke M.D.   On: 02/15/2023 02:42    Procedures Procedures   CRITICAL CARE Performed by: Tilden Fossa   Total critical care time: 35 minutes  Critical care time was exclusive of separately billable procedures and treating other patients.  Critical care was necessary to treat or prevent imminent or life-threatening deterioration.  Critical care was time spent personally by me on the following activities: development of treatment plan with patient and/or surrogate as well as nursing, discussions with consultants, evaluation of patient's response to treatment, examination of patient, obtaining history from patient  or surrogate, ordering and performing treatments and interventions, ordering and review of laboratory studies, ordering and review of radiographic studies, pulse oximetry and re-evaluation of patient's condition.  Medications Ordered in ED Medications  fentaNYL (SUBLIMAZE) injection 50 mcg (50 mcg Intravenous Given 02/15/23 0322)  aspirin EC tablet 81 mg (has no administration in time range)  atorvastatin (LIPITOR) tablet 40 mg (has no administration in time range)  losartan (COZAAR) tablet 50 mg (has no administration in time range)  citalopram (CELEXA) tablet 10 mg (has no administration in time range)  acetaminophen (TYLENOL) tablet 650 mg (650 mg Oral Given 02/15/23 0519)    Or  acetaminophen (TYLENOL) suppository 650 mg ( Rectal See Alternative 02/15/23 0519)  oxyCODONE (Oxy IR/ROXICODONE) immediate release tablet 5 mg (5 mg Oral  Given 02/15/23 0520)  morphine (PF) 2 MG/ML injection 2 mg (has no administration in time range)  ondansetron (ZOFRAN) tablet 4 mg (has no administration in time range)    Or  ondansetron (ZOFRAN) injection 4 mg (has no administration in time range)    ED Course/ Medical Decision Making/ A&P                                 Medical Decision Making Amount and/or Complexity of Data Reviewed Labs: ordered. Radiology: ordered.  Risk Prescription drug management. Decision regarding hospitalization.   Patient presented to the emergency department as a level 2 trauma alert following a fall on thinners.  She does take Plavix and aspirin for history of CVA and coronary artery disease.  She does have a right lower extremity/hip deformity as well as hematoma to the face.  Imaging is significant for right intertrochanteric femur fracture-images personally reviewed and interpreted, agree with radiologist interpretation.  There is no evidence of serious closed head injury.  UA not consistent with UTI.  CBC with mild anemia.  Discussed with Dr. Carola Frost with orthopedics-he will see the patient in consult.  He recommends that she remain NPO.  Hospitalist consulted for admission.  Patient and husband at the bedside updated findings of studies and they are in agreement with admission for ongoing care.         Final Clinical Impression(s) / ED Diagnoses Final diagnoses:  Fall, initial encounter  Closed comminuted intertrochanteric fracture of proximal femur, right, initial encounter (HCC)  Contusion of other part of head, initial encounter    Rx / DC Orders ED Discharge Orders     None         Tilden Fossa, MD 02/15/23 [phone]

## 2023-02-15 NOTE — Plan of Care (Signed)
  Problem: Education: Goal: Knowledge of General Education information will improve Description: Including pain rating scale, medication(s)/side effects and non-pharmacologic comfort measures Outcome: Progressing   Problem: Coping: Goal: Level of anxiety will decrease Outcome: Progressing   Problem: Elimination: Goal: Will not experience complications related to bowel motility Outcome: Progressing Goal: Will not experience complications related to urinary retention Outcome: Progressing   

## 2023-02-15 NOTE — Assessment & Plan Note (Signed)
Patient with traumatic intratrochanteric fx. Ortho to consult: ORIF vs THR. She has been taking Plavix after CVA - now on hold.   Plan Ortho/Anesth to recommend timing of surgery - plavix not taken today and will not be ordered  Pain mgt - APAP 500 mg QID and ketorolac 15 mg IV q6  Dilaudid for breakthru uncontrolled pain.

## 2023-02-15 NOTE — H&P (View-Only) (Signed)
Orthopaedic Trauma Service (OTS) Consult   Patient ID: [name] MRN: [id number] DOB/AGE: 1956/05/29 67 y.o.  Reason for Consult:Right intertrochanteric femur fracture Referring Physician: Dr. Tilden Fossa, MD [facility]  HPI: [name] is an 68 y.o. female who is being seen in consultation at the request of Dr. Madilyn Hook for evaluation of right intertrochanteric femur fracture.  Patient sustained a ground-level fall and sustained a right intertrochanteric femur fracture.  She was subsequently transferred to [facility] and the x-ray showed the above fracture.  Orthopedics was consulted.  Patient has a history of diabetes as well as hypertension.  She is on Plavix and aspirin.  She refuses all blood products.  She lives at home with her husband.  They have no steps to enter she has a downstairs bedroom and bathroom.  Denies any other injuries other than a sore neck.  Past Medical History:  Diagnosis Date   Anxiety    a. with claustrophobia   Arthritis    "right hand; maybe just starting in my left hand" (04/21/2012)   Asthma    CAD (coronary artery disease)    a. PCI [place] 2004. b. Angina - balloon angioplasty to LAD 04/21/12 (vessel too small for stent).   Carotid stenosis    a. 11/2011 critical R, ~30% L by angiogram;  b. 04/2012 s/p R CEA.   Cataract    left   Chronic lower back pain    Depression    a. occasionally takes st john's wart   Diabetes mellitus type 2, uncontrolled    Dysrhythmia    Embolic stroke (HCC)    a. 11/2011 MRI: mult small b/l infarcts c/w emboli;  b. 11/2011 MRA neck: high grade RICA stenosis;  c. 6/20013 TEE: EF 55-60% with mild LVH, severe MAC, < 1 cm calc mobile structure on the MV annulus, ? 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 Thought was RICA stenosis did not explain the b/l small strokes; concern for emboli from mobile MV struct;  d. 01/2012 Event Mon:  no AFib   History of echocardiogram    Echo 12/16: mild LVH, EF  55-60%, no RWMA, Gr 2 DD, trivial AI, MAC, mild MR, mild LAE   History of kidney stones    History of nuclear stress test    a. Myoview 12/16: EF 46%, no ischemia, Intermediate Risk (low EF)   HLD (hyperlipidemia)    a. 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 visual changes   Myocardial infarction [facility])    prior to 2013   Peripheral neuropathy    Refusal of blood transfusions as patient is Jehovah's Witness    Stroke (HCC) 2015- dec   mini stroke   Uncontrolled type 2 diabetes mellitus with circulatory disorder, with long-term current use of insulin 05/10/2015    Past Surgical History:  Procedure Laterality Date   ANKLE SURGERY  2005   after fall down stairs, left with screws and plate in place   CARDIAC CATHETERIZATION  2004   WNL, no significant coronary disease   CARDIAC CATHETERIZATION  12/19/11   CAROTID ANGIOGRAM Bilateral 12/19/2011   Procedure: CAROTID ANGIOGRAM;  Surgeon: Nada Libman, MD;  Location: [facility] CATH LAB;  Service: Cardiovascular;  Laterality: Bilateral;   CAROTID ENDARTERECTOMY     CARPAL TUNNEL RELEASE  ~ 2005   bilateral   CATARACT EXTRACTION W/ INTRAOCULAR LENS IMPLANT  2012  right   CATARACT EXTRACTION W/PHACO Left 10/2015   Dr Noel Gerold at [facility]   CORONARY ANGIOPLASTY  04/21/2012   95% stenosis mid LAD, vessel not large enough for stent, rec plavix/ASA for 1 mo   CORONARY ANGIOPLASTY WITH STENT PLACEMENT  2002   x2   ENDARTERECTOMY  05/09/2012   Procedure: ENDARTERECTOMY CAROTID;  Surgeon: Nada Libman, MD;  Location: [facility] OR;  Service: Vascular;  Laterality: Right;  Right carotd endarterectomy with bovine patch angioplasty   ENDARTERECTOMY Left 06/23/2015   Procedure: ENDARTERECTOMY CAROTID;  Surgeon: Nada Libman, MD;  Location: [facility] OR;  Service: Vascular;  Laterality: Left;   EYE SURGERY Left 04/2015   laser surgery,blood vessels rupturing   holter monitor  2013   WNL   KIDNEY STONE SURGERY      several times   LITHOTRIPSY     LOOP RECORDER INSERTION N/A 07/22/2019   Procedure: LOOP RECORDER INSERTION;  Surgeon: Marcina Millard, MD;  Location: [facility] INVASIVE CV LAB;  Service: Cardiovascular;  Laterality: N/A;   PARS PLANA VITRECTOMY Left 04/2015   [facility])   [facility] ANGIOPLASTY Left 06/23/2015   Procedure: PATCH ANGIOPLASTY USING 1cm x 4cm BOVINE PERICARDIAL PATCH;  Surgeon: Nada Libman, MD;  Location: [facility] OR;  Service: Vascular;  Laterality: Left;   PERCUTANEOUS CORONARY STENT INTERVENTION (PCI-S) N/A 04/21/2012   Procedure: PERCUTANEOUS CORONARY STENT INTERVENTION (PCI-S);  Surgeon: Kathleene Hazel, MD;  Location: [facility] CATH LAB;  Service: Cardiovascular;  Laterality: N/A;   TEE WITHOUT CARDIOVERSION  12/14/2011   Procedure: TRANSESOPHAGEAL ECHOCARDIOGRAM (TEE);  Surgeon: Laurey Morale, MD;  Location: [facility] ENDOSCOPY;  Service: Cardiovascular;  Laterality: N/A;   US ECHOCARDIOGRAPHY  2002   normal LV fxn, mild LVH, borderline L atrial size    Family History  Problem Relation Age of Onset   Heart disease Mother        valve problems   Stroke Mother    Hypertension Mother    Heart disease Sister        valve problems   Heart disease Brother    Diabetes Father    Hypertension Father    AAA (abdominal aortic aneurysm) Father    Cancer Neg Hx     Social History:  reports that she quit smoking about 50 years ago. Her smoking use included cigarettes. She has never used smokeless tobacco. She reports that she does not drink alcohol and does not use drugs.  Allergies:  Allergies  Allergen Reactions   Other Shortness Of Breath    No blood or blood products   Penicillins Itching and Rash   Toradol [Ketorolac Tromethamine] Other (See Comments)    Panic attack    Imdur [Isosorbide Nitrate] Other (See Comments)    Headaches    Latex Rash    Rash on all points of contact (thick, heavy itching rash)   Metformin And Related Diarrhea and Nausea And Vomiting     Medications:  No current facility-administered medications on file prior to encounter.   Current Outpatient Medications on File Prior to Encounter  Medication Sig Dispense Refill   ALPHA LIPOIC ACID PO Take 1 capsule by mouth daily.     aspirin EC 81 MG tablet Take 81 mg by mouth daily.     atorvastatin (LIPITOR) 40 MG tablet Take 1 tablet (40 mg total) by mouth daily at 6 PM. (Patient taking differently: Take 20 mg by mouth daily.) 30 tablet 0   clopidogrel (PLAVIX) 75 MG tablet Take  75 mg by mouth daily.     FENUGREEK PO Take 2 tablets by mouth daily as needed (muscle stiffness).     losartan (COZAAR) 100 MG tablet Take 100 mg by mouth daily.     Vitamin D-Vitamin K (D3 + K2 PO) Take 1 tablet by mouth daily.       ROS: Constitutional: No fever or chills Vision: No changes in vision ENT: No difficulty swallowing CV: No chest pain Pulm: No SOB or wheezing GI: No nausea or vomiting GU: No urgency or inability to hold urine Skin: No poor wound healing Neurologic: No numbness or tingling Psychiatric: No depression or anxiety Heme: No bruising Allergic: No reaction to medications or food   Exam: Blood pressure 123/78, pulse 89, temperature 98.5 F (36.9 C), temperature source Oral, resp. rate 18, height 5\' 6"  (1.676 m), weight 66.5 kg, SpO2 95%. General: No acute distress Orientation: Awake alert and oriented x 3 Mood and Affect: Cooperative and pleasant Gait: Unable to assess due to her fracture Coordination and balance: Within normal limits  Right lower extremity: Leg is shortened and externally rotated.  No skin lesions noted.  She has warm well-perfused foot with 2+ DP pulses.  She has active dorsiflexion plantarflexion of her foot and ankle.  She has a no deformity through the knee or ankle.  Left lower extremity: Skin without lesions. No tenderness to palpation. Full painless ROM, full strength in each muscle groups without evidence of instability.   Medical Decision  Making: Data: Imaging: X-rays of the right hip show a comminuted right intertrochanteric femur fracture with varus angulation.  Labs:  Results for orders placed or performed during the hospital encounter of 02/15/23 (from the past 24 hour(s))  Comprehensive metabolic panel     Status: Abnormal   Collection Time: 02/15/23  2:00 AM  Result Value Ref Range   Sodium 136 135 - 145 mmol/L   Potassium 3.8 3.5 - 5.1 mmol/L   Chloride 103 98 - 111 mmol/L   CO2 24 22 - 32 mmol/L   Glucose, Bld 175 (H) 70 - 99 mg/dL   BUN 16 8 - 23 mg/dL   Creatinine, Ser 4.01 0.44 - 1.00 mg/dL   Calcium 9.7 8.9 - 02.7 mg/dL   Total Protein 6.5 6.5 - 8.1 g/dL   Albumin 3.6 3.5 - 5.0 g/dL   AST 16 15 - 41 U/L   ALT 16 0 - 44 U/L   Alkaline Phosphatase 76 38 - 126 U/L   Total Bilirubin 0.8 0.3 - 1.2 mg/dL   GFR, Estimated >25 >36 mL/min   Anion gap 9 5 - 15  CBC with Differential     Status: Abnormal   Collection Time: 02/15/23  2:00 AM  Result Value Ref Range   WBC 9.8 4.0 - 10.5 K/uL   RBC 3.87 3.87 - 5.11 MIL/uL   Hemoglobin 10.8 (L) 12.0 - 15.0 g/dL   HCT 64.4 (L) 03.4 - 74.2 %   MCV 86.3 80.0 - 100.0 fL   MCH 27.9 26.0 - 34.0 pg   MCHC 32.3 30.0 - 36.0 g/dL   RDW 59.5 63.8 - 75.6 %   Platelets 268 150 - 400 K/uL   nRBC 0.0 0.0 - 0.2 %   Neutrophils Relative % 60 %   Neutro Abs 5.9 1.7 - 7.7 K/uL   Lymphocytes Relative 29 %   Lymphs Abs 2.9 0.7 - 4.0 K/uL   Monocytes Relative 9 %   Monocytes Absolute 0.9 0.1 -  1.0 K/uL   Eosinophils Relative 1 %   Eosinophils Absolute 0.1 0.0 - 0.5 K/uL   Basophils Relative 0 %   Basophils Absolute 0.0 0.0 - 0.1 K/uL   Immature Granulocytes 1 %   Abs Immature Granulocytes 0.05 0.00 - 0.07 K/uL  Protime-INR     Status: None   Collection Time: 02/15/23  2:00 AM  Result Value Ref Range   Prothrombin Time 14.0 11.4 - 15.2 seconds   INR 1.1 0.8 - 1.2  Urinalysis, Routine w reflex microscopic -Urine, Clean Catch     Status: Abnormal   Collection Time:  02/15/23  2:04 AM  Result Value Ref Range   Color, Urine YELLOW YELLOW   APPearance HAZY (A) CLEAR   Specific Gravity, Urine 1.013 1.005 - 1.030   pH 6.0 5.0 - 8.0   Glucose, UA 50 (A) NEGATIVE mg/dL   Hgb urine dipstick NEGATIVE NEGATIVE   Bilirubin Urine NEGATIVE NEGATIVE   Ketones, ur NEGATIVE NEGATIVE mg/dL   Protein, ur NEGATIVE NEGATIVE mg/dL   Nitrite NEGATIVE NEGATIVE   Leukocytes,Ua MODERATE (A) NEGATIVE   RBC / HPF 0-5 0 - 5 RBC/hpf   WBC, UA 21-50 0 - 5 WBC/hpf   Bacteria, UA RARE (A) NONE SEEN   Squamous Epithelial / HPF 0-5 0 - 5 /HPF  HIV Antibody (routine testing w rflx)     Status: None   Collection Time: 02/15/23  6:14 AM  Result Value Ref Range   HIV Screen 4th Generation wRfx Non Reactive Non Reactive  Glucose, capillary     Status: Abnormal   Collection Time: 02/15/23 10:22 AM  Result Value Ref Range   Glucose-Capillary 166 (H) 70 - 99 mg/dL     Imaging or Labs ordered: None  Medical history and chart was reviewed and case discussed with medical provider.  Assessment/Plan: 67 year old female with a ground-level fall with right intertrochanteric femur fracture.  Due to the unstable nature of her injury I recommend proceeding with intramedullary nailing of the right hip.  Risks and benefits were discussed with the patient and her husband.  Risks include but not limited to bleeding, infection, malunion, nonunion, hardware failure, hardware irritation, nerve or blood vessel injury, DVT, even the possibility anesthetic complications.  They agreed to proceed with surgery and consent was obtained.  Roby Lofts, MD [facility] [phone] (office) [email or web address]

## 2023-02-15 NOTE — Assessment & Plan Note (Signed)
Patient with h/o asthma, mostly quiescent. Lungs clear on exam. Nl respiratory effort  Plan Albuterol MDI q4 prn wheezing

## 2023-02-15 NOTE — ED Notes (Signed)
ED TO INPATIENT HANDOFF REPORT  ED Nurse Name and Phone #: [phone]  S Name/Age/Gender [name] 67 y.o. female Room/Bed: TRAAC/TRAAC  Code Status   Code Status: Full Code  Home/SNF/Other Home Patient oriented to: self, place, time, and situation Is this baseline? Yes   Triage Complete: Triage complete  Chief Complaint Hip fracture (HCC) [S72.009A]  Triage Note Pt BIB [facility] from home. Pt was ambulating to the bathroom and tripped. Pt denies LOC. Pts R leg shorter than left, crepitus noted to R hip, hematoma to R side of head. Pt takes Plavix.   fentanyl given in route    Allergies Allergies  Allergen Reactions   Other Shortness Of Breath    No blood or blood products   Penicillins Itching and Rash    From when younger Has patient had a PCN reaction causing immediate rash, facial/tongue/throat swelling, SOB or lightheadedness with hypotension: No Has patient had a PCN reaction causing severe rash involving mucus membranes or skin necrosis: No Has patient had a PCN reaction that required hospitalization: No Has patient had a PCN reaction occurring within the last 10 years: No If all of the above answers are "NO", then may proceed with Cephalosporin use.     Toradol [Ketorolac Tromethamine] Other (See Comments)    Panic attack    Isosorbide Nitrate Other (See Comments)    headaches   Latex Rash    Rash on all points of contact (thick, heavy itching rash)   Metformin And Related Diarrhea and Nausea And Vomiting    Level of Care/Admitting Diagnosis ED Disposition     ED Disposition  Admit   Condition  --   Comment  Hospital Area: [facility] [[id number]]  Level of Care: Med-Surg [16]  May place patient in observation at [facility] or [facility] if equivalent level of care is available:: Yes  Covid Evaluation: Asymptomatic - no recent exposure (last 10 days) testing not required  Diagnosis: Hip fracture [facility]) [[id number]]  Admitting  Physician: Alan Mulder [[id number]]  Attending Physician: Alan Mulder [[id number]]          B Medical/Surgery History Past Medical History:  Diagnosis Date   Anxiety    a. with claustrophobia   Arthritis    "right hand; maybe just starting in my left hand" (04/21/2012)   Asthma    CAD (coronary artery disease)    a. PCI [place] 2004. b. Angina - balloon angioplasty to LAD 04/21/12 (vessel too small for stent).   Carotid stenosis    a. 11/2011 critical R, ~30% L by angiogram;  b. 04/2012 s/p R CEA.   Cataract    left   Chronic lower back pain    Depression    a. occasionally takes st john's wart   Diabetes mellitus type 2, uncontrolled (HCC)    Dysrhythmia    Embolic stroke (HCC)    a. 11/2011 MRI: mult small b/l infarcts c/w emboli;  b. 11/2011 MRA neck: high grade RICA stenosis;  c. 6/20013 TEE: EF 55-60% with mild LVH, severe MAC, < 1 cm calc mobile structure on the MV annulus, ? 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 Thought was RICA stenosis did not explain the b/l small strokes; concern for emboli from mobile MV struct;  d. 01/2012 Event Mon:  no AFib   History of echocardiogram    Echo 12/16: mild LVH, EF 55-60%, no RWMA, Gr 2 DD, trivial AI, MAC, mild MR, mild LAE  History of kidney stones    History of nuclear stress test    a. Myoview 12/16: EF 46%, no ischemia, Intermediate Risk (low EF)   HLD (hyperlipidemia)    a. 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 visual changes   Myocardial infarction [facility])    prior to 2013   Peripheral neuropathy    Refusal of blood transfusions as patient is Jehovah's Witness    Stroke (HCC) 2015- dec   mini stroke   Uncontrolled type 2 diabetes mellitus with circulatory disorder, with long-term current use of insulin (HCC) 05/10/2015   Past Surgical History:  Procedure Laterality Date   ANKLE SURGERY  2005   after fall down stairs, left with screws and plate in  place   CARDIAC CATHETERIZATION  2004   WNL, no significant coronary disease   CARDIAC CATHETERIZATION  12/19/11   CAROTID ANGIOGRAM Bilateral 12/19/2011   Procedure: CAROTID ANGIOGRAM;  Surgeon: Nada Libman, MD;  Location: [facility] CATH LAB;  Service: Cardiovascular;  Laterality: Bilateral;   CAROTID ENDARTERECTOMY     CARPAL TUNNEL RELEASE  ~ 2005   bilateral   CATARACT EXTRACTION W/ INTRAOCULAR LENS IMPLANT  2012   right   CATARACT EXTRACTION W/PHACO Left 10/2015   Dr Noel Gerold at [facility]   CORONARY ANGIOPLASTY  04/21/2012   95% stenosis mid LAD, vessel not large enough for stent, rec plavix/ASA for 1 mo   CORONARY ANGIOPLASTY WITH STENT PLACEMENT  2002   x2   ENDARTERECTOMY  05/09/2012   Procedure: ENDARTERECTOMY CAROTID;  Surgeon: Nada Libman, MD;  Location: [facility] OR;  Service: Vascular;  Laterality: Right;  Right carotd endarterectomy with bovine patch angioplasty   ENDARTERECTOMY Left 06/23/2015   Procedure: ENDARTERECTOMY CAROTID;  Surgeon: Nada Libman, MD;  Location: [facility] OR;  Service: Vascular;  Laterality: Left;   EYE SURGERY Left 04/2015   laser surgery,blood vessels rupturing   holter monitor  2013   WNL   KIDNEY STONE SURGERY     several times   LITHOTRIPSY     LOOP RECORDER INSERTION N/A 07/22/2019   Procedure: LOOP RECORDER INSERTION;  Surgeon: Marcina Millard, MD;  Location: [facility] INVASIVE CV LAB;  Service: Cardiovascular;  Laterality: N/A;   PARS PLANA VITRECTOMY Left 04/2015   [facility])   [facility] ANGIOPLASTY Left 06/23/2015   Procedure: PATCH ANGIOPLASTY USING 1cm x 4cm BOVINE PERICARDIAL PATCH;  Surgeon: Nada Libman, MD;  Location: [facility] OR;  Service: Vascular;  Laterality: Left;   PERCUTANEOUS CORONARY STENT INTERVENTION (PCI-S) N/A 04/21/2012   Procedure: PERCUTANEOUS CORONARY STENT INTERVENTION (PCI-S);  Surgeon: Kathleene Hazel, MD;  Location: [facility] CATH LAB;  Service: Cardiovascular;  Laterality: N/A;   TEE WITHOUT CARDIOVERSION  12/14/2011    Procedure: TRANSESOPHAGEAL ECHOCARDIOGRAM (TEE);  Surgeon: Laurey Morale, MD;  Location: [facility] ENDOSCOPY;  Service: Cardiovascular;  Laterality: N/A;   US ECHOCARDIOGRAPHY  2002   normal LV fxn, mild LVH, borderline L atrial size     A IV Location/Drains/Wounds Patient Lines/Drains/Airways Status     Active Line/Drains/Airways     Name Placement date Placement time Site Days   Peripheral IV 02/15/23 20 G Right Antecubital 02/15/23  --  Antecubital  less than 1   Incision (Closed) 06/23/15 Neck Left 06/23/15  0733  -- 2794            Intake/Output Last 24 hours No intake or output data in the 24 hours  ending 02/15/23 0339  Labs/Imaging Results for orders placed or performed during the hospital encounter of 02/15/23 (from the past 48 hour(s))  Comprehensive metabolic panel     Status: Abnormal   Collection Time: 02/15/23  2:00 AM  Result Value Ref Range   Sodium 136 135 - 145 mmol/L   Potassium 3.8 3.5 - 5.1 mmol/L   Chloride 103 98 - 111 mmol/L   CO2 24 22 - 32 mmol/L   Glucose, Bld 175 (H) 70 - 99 mg/dL    Comment: Glucose reference range applies only to samples taken after fasting for at least 8 hours.   BUN 16 8 - 23 mg/dL   Creatinine, Ser 1.61 0.44 - 1.00 mg/dL   Calcium 9.7 8.9 - 09.6 mg/dL   Total Protein 6.5 6.5 - 8.1 g/dL   Albumin 3.6 3.5 - 5.0 g/dL   AST 16 15 - 41 U/L   ALT 16 0 - 44 U/L   Alkaline Phosphatase 76 38 - 126 U/L   Total Bilirubin 0.8 0.3 - 1.2 mg/dL   GFR, Estimated >04 >54 mL/min    Comment: (NOTE) Calculated using the CKD-EPI Creatinine Equation (2021)    Anion gap 9 5 - 15    Comment: Performed at [facility], [address] [place], [place] [address]  CBC with Differential     Status: Abnormal   Collection Time: 02/15/23  2:00 AM  Result Value Ref Range   WBC 9.8 4.0 - 10.5 K/uL   RBC 3.87 3.87 - 5.11 MIL/uL   Hemoglobin 10.8 (L) 12.0 - 15.0 g/dL   HCT 91.4 (L) 78.2 - 95.6 %   MCV 86.3 80.0 - 100.0 fL   MCH 27.9 26.0 - 34.0  pg   MCHC 32.3 30.0 - 36.0 g/dL   RDW 21.3 08.6 - 57.8 %   Platelets 268 150 - 400 K/uL   nRBC 0.0 0.0 - 0.2 %   Neutrophils Relative % 60 %   Neutro Abs 5.9 1.7 - 7.7 K/uL   Lymphocytes Relative 29 %   Lymphs Abs 2.9 0.7 - 4.0 K/uL   Monocytes Relative 9 %   Monocytes Absolute 0.9 0.1 - 1.0 K/uL   Eosinophils Relative 1 %   Eosinophils Absolute 0.1 0.0 - 0.5 K/uL   Basophils Relative 0 %   Basophils Absolute 0.0 0.0 - 0.1 K/uL   Immature Granulocytes 1 %   Abs Immature Granulocytes 0.05 0.00 - 0.07 K/uL    Comment: Performed at [facility], [address] [place], [place] [address]  Protime-INR     Status: None   Collection Time: 02/15/23  2:00 AM  Result Value Ref Range   Prothrombin Time 14.0 11.4 - 15.2 seconds   INR 1.1 0.8 - 1.2    Comment: (NOTE) INR goal varies based on device and disease states. Performed at [facility], [address] [place], [place] [address]    DG Hip Lucienne Capers or [place] Pelvis 2-3 Views Right  Result Date: 02/15/2023 CLINICAL DATA:  Fall fall, right hip pain EXAM: DG HIP (WITH OR WITHOUT PELVIS) 2-3V RIGHT COMPARISON:  None Available. FINDINGS: Acute, comminuted intratrochanteric fracture of the right hip noted with avulsion of the lesser trochanter and mild varus angulation of the distal fracture fragment. Femoral head is still seated within the right acetabulum. Pelvis and visualized left hip are intact. Vascular calcifications are noted. IMPRESSION: 1. Acute, comminuted intratrochanteric fracture of the right hip. Electronically  Signed   By: Helyn Numbers M.D.   On: 02/15/2023 02:45   DG Chest Port 1 View  Result Date: 02/15/2023 CLINICAL DATA:  Fall, chest trauma EXAM: PORTABLE CHEST 1 VIEW COMPARISON:  03/18/2019 FINDINGS: Lungs are well expanded, symmetric, and clear. No pneumothorax or pleural effusion. Cardiac size within normal limits. Calcification of the mitral valve annulus noted. Pulmonary vascularity is normal. Osseous  structures are age-appropriate. No acute bone abnormality. IMPRESSION: No active disease. Electronically Signed   By: Helyn Numbers M.D.   On: 02/15/2023 02:45   CT Head Wo Contrast  Result Date: 02/15/2023 CLINICAL DATA:  Fall, on blood thinners, head and neck trauma EXAM: CT HEAD WITHOUT CONTRAST CT CERVICAL SPINE WITHOUT CONTRAST TECHNIQUE: Multidetector CT imaging of the head and cervical spine was performed following the standard protocol without intravenous contrast. Multiplanar CT image reconstructions of the cervical spine were also generated. RADIATION DOSE REDUCTION: This exam was performed according to the departmental dose-optimization program which includes automated exposure control, adjustment of the mA and/or kV according to patient size and/or use of iterative reconstruction technique. COMPARISON:  03/17/2019 CT head, no prior CT cervical spine FINDINGS: CT HEAD FINDINGS Brain: No evidence of acute infarct, hemorrhage, mass, mass effect, or midline shift. No hydrocephalus or extra-axial fluid collection. Age-related cerebral atrophy. Encephalomalacia in the right occipital lobe, unchanged. Remote right thalamic lacunar infarct. Vascular: No hyperdense vessel. Atherosclerotic calcifications in the intracranial carotid and vertebral arteries. Skull: Negative for fracture or focal lesion. Right frontal scalp hematoma. Sinuses/Orbits: Mucosal thickening in the ethmoid air cells. Status post bilateral lens replacements. Other: The mastoid air cells are well aerated. CT CERVICAL SPINE FINDINGS Alignment: No traumatic listhesis. Skull base and vertebrae: No acute fracture or suspicious osseous lesion. Soft tissues and spinal canal: No prevertebral fluid or swelling. No visible canal hematoma. Disc levels: Degenerative changes in the cervical spine. Mild spinal canal stenosis at C5-C6 and C6-C7. Upper chest: No focal pulmonary opacity or pleural effusion. IMPRESSION: 1. No acute intracranial process.  Right frontal scalp hematoma. 2. No acute fracture or traumatic listhesis in the cervical spine. Electronically Signed   By: Wiliam Ke M.D.   On: 02/15/2023 02:42   CT Cervical Spine Wo Contrast  Result Date: 02/15/2023 CLINICAL DATA:  Fall, on blood thinners, head and neck trauma EXAM: CT HEAD WITHOUT CONTRAST CT CERVICAL SPINE WITHOUT CONTRAST TECHNIQUE: Multidetector CT imaging of the head and cervical spine was performed following the standard protocol without intravenous contrast. Multiplanar CT image reconstructions of the cervical spine were also generated. RADIATION DOSE REDUCTION: This exam was performed according to the departmental dose-optimization program which includes automated exposure control, adjustment of the mA and/or kV according to patient size and/or use of iterative reconstruction technique. COMPARISON:  03/17/2019 CT head, no prior CT cervical spine FINDINGS: CT HEAD FINDINGS Brain: No evidence of acute infarct, hemorrhage, mass, mass effect, or midline shift. No hydrocephalus or extra-axial fluid collection. Age-related cerebral atrophy. Encephalomalacia in the right occipital lobe, unchanged. Remote right thalamic lacunar infarct. Vascular: No hyperdense vessel. Atherosclerotic calcifications in the intracranial carotid and vertebral arteries. Skull: Negative for fracture or focal lesion. Right frontal scalp hematoma. Sinuses/Orbits: Mucosal thickening in the ethmoid air cells. Status post bilateral lens replacements. Other: The mastoid air cells are well aerated. CT CERVICAL SPINE FINDINGS Alignment: No traumatic listhesis. Skull base and vertebrae: No acute fracture or suspicious osseous lesion. Soft tissues and spinal canal: No prevertebral fluid or swelling. No visible canal hematoma. Disc levels:  Degenerative changes in the cervical spine. Mild spinal canal stenosis at C5-C6 and C6-C7. Upper chest: No focal pulmonary opacity or pleural effusion. IMPRESSION: 1. No acute  intracranial process. Right frontal scalp hematoma. 2. No acute fracture or traumatic listhesis in the cervical spine. Electronically Signed   By: Wiliam Ke M.D.   On: 02/15/2023 02:42    Pending Labs Unresulted Labs (From admission, onward)     Start     Ordered   02/15/23 0326  HIV Antibody (routine testing w rflx)  (HIV Antibody (Routine testing w reflex) panel)  Once,   R        02/15/23 0326   02/15/23 0204  Type and screen [facility]  Once,   STAT       Comments: [facility]    02/15/23 0204   02/15/23 0204  Urinalysis, Routine w reflex microscopic -Urine, Clean Catch  Once,   URGENT       Question:  Specimen Source  Answer:  Urine, Clean Catch   02/15/23 0204            Vitals/Pain Today's Vitals   02/15/23 0245 02/15/23 0300 02/15/23 0315 02/15/23 0322  BP: (!) 165/98 (!) 178/93 (!) 183/95   Pulse: 94 94 98   Resp: 14 12 15    Temp:      TempSrc:      SpO2: 99% 100% 99%   Weight:      Height:      PainSc:    8     Isolation Precautions No active isolations  Medications Medications  fentaNYL (SUBLIMAZE) injection 50 mcg (50 mcg Intravenous Given 02/15/23 0322)  aspirin EC tablet 81 mg (has no administration in time range)  atorvastatin (LIPITOR) tablet 40 mg (has no administration in time range)  losartan (COZAAR) tablet 50 mg (has no administration in time range)  citalopram (CELEXA) tablet 10 mg (has no administration in time range)  acetaminophen (TYLENOL) tablet 650 mg (has no administration in time range)    Or  acetaminophen (TYLENOL) suppository 650 mg (has no administration in time range)  oxyCODONE (Oxy IR/ROXICODONE) immediate release tablet 5 mg (has no administration in time range)  morphine (PF) 2 MG/ML injection 2 mg (has no administration in time range)  ondansetron (ZOFRAN) tablet 4 mg (has no administration in time range)    Or  ondansetron (ZOFRAN) injection 4 mg (has no administration in time range)     Mobility walks     Focused Assessments    R Recommendations: See Admitting Provider Note  Report given to:   Additional Notes: a/ox4, continent x2  has purewick.

## 2023-02-15 NOTE — Subjective & Objective (Signed)
[name], a 68 y/o woman with DM, HLD, HTN had a mechanical fall at home striking he head and right hip. She was seen at outside ED where she had nl CT head, CT c-spine. Hip xray revealed comminuted intratrochanteric fx right. She was transferred to [facility] for ortho consult and repair.

## 2023-02-15 NOTE — Progress Notes (Signed)
Initial Nutrition Assessment  DOCUMENTATION CODES:   Not applicable  INTERVENTION:  Once diet resumes, recommend: Carb modified diet Glucerna Shake po TID, each supplement provides 220 kcal and 10 grams of protein  NUTRITION DIAGNOSIS:   Increased nutrient needs related to post-op healing, hip fracture as evidenced by estimated needs.  GOAL:   Patient will meet greater than or equal to 90% of their needs  MONITOR:   PO intake, Supplement acceptance, Labs, Weight trends  REASON FOR ASSESSMENT:   Consult Hip fracture protocol  ASSESSMENT:   Pt admitted as transfer from OSH with R comminuted intertrochanteric fracture. PMH significant for DM, HLD, HTN.   Plans for R IM nail of R hip today. Off unit for procedure. Unable to obtain detailed nutrition related history at this time.   Pt is NPO for surgery. No documented meal completions on file to review.   Unfortunately there is limited documented of weight history within the last year. Current weight is 66.5 kg. Last known weight was 77.4 kg on 07/22/19. Uncertain how recently this weight loss has occurred.   Medications: SSI 0-15 units q4h, tresiba 15 units daily  Labs: CBG's 119-166 x24 hours  Last HgbA1c 12.8% (2020)  NUTRITION - FOCUSED PHYSICAL EXAM: Deferred to follow up as pt off unit.   Diet Order:   Diet Order             Diet NPO time specified  Diet effective now                   EDUCATION NEEDS:   No education needs have been identified at this time  Skin:  Skin Assessment: Reviewed RN Assessment (R hip closed incision)  Last BM:  8/22  Height:   Ht Readings from Last 1 Encounters:  02/15/23 5\' 6"  (1.676 m)    Weight:   Wt Readings from Last 1 Encounters:  02/15/23 66.5 kg   BMI:  Body mass index is 23.66 kg/m.  Estimated Nutritional Needs:   Kcal:  1700-1900  Protein:  85-100g  Fluid:  >/=1.7L  Drusilla Kanner, RDN, LDN Clinical Nutrition

## 2023-02-15 NOTE — Anesthesia Preprocedure Evaluation (Signed)
Anesthesia Evaluation  Patient identified by MRN, date of birth, ID band Patient awake    Reviewed: Allergy & Precautions, NPO status , Patient's Chart, lab work & pertinent test results  History of Anesthesia Complications Negative for: history of anesthetic complications  Airway Mallampati: III  TM Distance: >3 FB Neck ROM: Full    Dental  (+) Dental Advisory Given   Pulmonary asthma , former smoker   breath sounds clear to auscultation       Cardiovascular hypertension, Pt. on medications + CAD and + Past MI  + dysrhythmias  Rhythm:Regular  . PCI [place] 2004. b. Angina - balloon angioplasty to LAD 04/21/12 (vessel too small for stent).   Neuro/Psych  Headaches PSYCHIATRIC DISORDERS Anxiety Depression     Neuromuscular disease CVA    GI/Hepatic negative GI ROS, Neg liver ROS,,,  Endo/Other  diabetes    Renal/GU      Musculoskeletal  (+) Arthritis ,  R hip fx   Abdominal   Peds  Hematology   Anesthesia Other Findings   Reproductive/Obstetrics                             Anesthesia Physical Anesthesia Plan  ASA: 2  Anesthesia Plan: General   Post-op Pain Management:    Induction: Intravenous  PONV Risk Score and Plan: 3 and Ondansetron and Dexamethasone  Airway Management Planned: Oral ETT  Additional Equipment: None  Intra-op Plan:   Post-operative Plan: Extubation in OR  Informed Consent: I have reviewed the patients History and Physical, chart, labs and discussed the procedure including the risks, benefits and alternatives for the proposed anesthesia with the patient or authorized representative who has indicated his/her understanding and acceptance.     Dental advisory given  Plan Discussed with: CRNA  Anesthesia Plan Comments:        Anesthesia Quick Evaluation

## 2023-02-15 NOTE — Assessment & Plan Note (Signed)
Patient reports frequent UTIs, last in July that responded to septra. Now with pyuria, scant bacteria, minimal discomfort.  Plan Septra DS bid x 5 days

## 2023-02-15 NOTE — Interval H&P Note (Signed)
History and Physical Interval Note:  02/15/2023 10:57 AM  [name]  has presented today for surgery, with the diagnosis of Right Hip Fracture.  The various methods of treatment have been discussed with the patient and family. After consideration of risks, benefits and other options for treatment, the patient has consented to  Procedure(s): INTRAMEDULLARY (IM) NAIL INTERTROCHANTERIC (Right) as a surgical intervention.  The patient's history has been reviewed, patient examined, no change in status, stable for surgery.  I have reviewed the patient's chart and labs.  Questions were answered to the patient's satisfaction.     Caryn Bee P Clovis Mankins

## 2023-02-15 NOTE — Discharge Instructions (Signed)
[facility] Discharge Instructions   General Discharge Instructions  WEIGHT BEARING STATUS:weightbearing as tolerated  RANGE OF MOTION/ACTIVITY: Unrestricted range of motion  Wound Care: You may remove your surgical dressing on post op day 2, (Sunday 02/17/23). Incisions can be left open to air if there is no drainage. Once the incision is completely dry and without drainage, it may be left open to air out.  Showering may begin post op day 3, (Monday 02/18/23).  Clean incision gently with soap and water.  DVT/PE prophylaxis: Aspirin and Plavix  Diet: as you were eating previously.  Can use over the counter stool softeners and bowel preparations, such as Miralax, to help with bowel movements.  Narcotics can be constipating.  Be sure to drink plenty of fluids  PAIN MEDICATION USE AND EXPECTATIONS  You have likely been given narcotic medications to help control your pain.  After a traumatic event that results in an fracture (broken bone) with or without surgery, it is ok to use narcotic pain medications to help control one's pain.  We understand that everyone responds to pain differently and each individual patient will be evaluated on a regular basis for the continued need for narcotic medications. Ideally, narcotic medication use should last no more than 6-8 weeks (coinciding with fracture healing).   As a patient it is your responsibility as well to monitor narcotic medication use and report the amount and frequency you use these medications when you come to your office visit.   We would also advise that if you are using narcotic medications, you should take a dose prior to therapy to maximize you participation.  IF YOU ARE ON NARCOTIC MEDICATIONS IT IS NOT PERMISSIBLE TO OPERATE A MOTOR VEHICLE (MOTORCYCLE/CAR/TRUCK/MOPED) OR HEAVY MACHINERY DO NOT MIX NARCOTICS WITH OTHER CNS (CENTRAL NERVOUS SYSTEM) DEPRESSANTS SUCH AS ALCOHOL   STOP SMOKING OR USING NICOTINE  PRODUCTS!!!!  As discussed nicotine severely impairs your body's ability to heal surgical and traumatic wounds but also impairs bone healing.  Wounds and bone heal by forming microscopic blood vessels (angiogenesis) and nicotine is a vasoconstrictor (essentially, shrinks blood vessels).  Therefore, if vasoconstriction occurs to these microscopic blood vessels they essentially disappear and are unable to deliver necessary nutrients to the healing tissue.  This is one modifiable factor that you can do to dramatically increase your chances of healing your injury.    (This means no smoking, no nicotine gum, patches, etc)  DO NOT USE NONSTEROIDAL ANTI-INFLAMMATORY DRUGS (NSAID'S)  Using products such as Advil (ibuprofen), Aleve (naproxen), Motrin (ibuprofen) for additional pain control during fracture healing can delay and/or prevent the healing response.  If you would like to take over the counter (OTC) medication, Tylenol (acetaminophen) is ok.  However, some narcotic medications that are given for pain control contain acetaminophen as well. Therefore, you should not exceed more than 4000 mg of tylenol in a day if you do not have liver disease.  Also note that there are may OTC medicines, such as cold medicines and allergy medicines that my contain tylenol as well.  If you have any questions about medications and/or interactions please ask your doctor/PA or your pharmacist.      ICE AND ELEVATE INJURED/OPERATIVE EXTREMITY  Using ice and elevating the injured extremity above your heart can help with swelling and pain control.  Icing in a pulsatile fashion, such as 20 minutes on and 20 minutes off, can be followed.    Do not place ice directly on skin. Make  sure there is a barrier between to skin and the ice pack.    Using frozen items such as frozen peas works well as the conform nicely to the are that needs to be iced.  USE AN ACE WRAP OR TED HOSE FOR SWELLING CONTROL  In addition to icing and elevation,  Ace wraps or TED hose are used to help limit and resolve swelling.  It is recommended to use Ace wraps or TED hose until you are informed to stop.    When using Ace Wraps start the wrapping distally (farthest away from the body) and wrap proximally (closer to the body)   Example: If you had surgery on your leg or thing and you do not have a splint on, start the ace wrap at the toes and work your way up to the thigh        If you had surgery on your upper extremity and do not have a splint on, start the ace wrap at your fingers and work your way up to the upper arm   CALL THE OFFICE WITH ANY QUESTIONS OR CONCERNS: [phone]   VISIT OUR WEBSITE FOR ADDITIONAL INFORMATION: [email or web address]    Discharge Wound Care Instructions  Do NOT apply any ointments, solutions or lotions to pin sites or surgical wounds.  These prevent needed drainage and even though solutions like hydrogen peroxide kill bacteria, they also damage cells lining the pin sites that help fight infection.  Applying lotions or ointments can keep the wounds moist and can cause them to breakdown and open up as well. This can increase the risk for infection. When in doubt call the office.  If any drainage is noted, use mepilex or other foam dressing - These dressing supplies should be available at local medical supply stores [facility], [facility], etc) as well as Insurance claims handler ([facility], [facility], [employer], etc)  Once the incision is completely dry and without drainage, it may be left open to air out.  Showering may begin 36-48 hours later.  Cleaning gently with soap and water.

## 2023-02-15 NOTE — Assessment & Plan Note (Signed)
On Statin therapy  Plan Continue statin  Lipid panel in AM

## 2023-02-15 NOTE — Progress Notes (Signed)
CCC Pre-op Review  Pre-op checklist:   NPO: since MN  Labs:  reviewed /wnl  Consent: signed per unit RN  H&P: needs update  Vitals:   O2 requirements: no  MAR/PTA review: yes  IV:  in place  Floor nurse name:  Vernona Rieger  Additional info:

## 2023-02-15 NOTE — Assessment & Plan Note (Signed)
Stable with no complaint of chest pain. EKG with LVH, no acute changes.  Plan Continue anti-lipemic medication  Clear for surgery

## 2023-02-15 NOTE — Op Note (Signed)
Orthopaedic Surgery Operative Note (CSN: [id number] ) Date of Surgery: 02/15/2023  Admit Date: 02/15/2023   Diagnoses: Pre-Op Diagnoses: Right intertrochanteric femur fracture   Post-Op Diagnosis: Same  Procedures: CPT 27245-Cephalomedullary nailing of right intertrochanteric femur fracture  Surgeons : Primary: Roby Lofts, MD  Assistant: Ulyses Southward, PA-C  Location: OR 3   Anesthesia: General   Antibiotics: Ancef 2g preop   Tourniquet time: None    Estimated Blood Loss: 50 mL  Complications:None   Specimens:* No specimens in log *   Implants: Implant Name Type Inv. Item Serial No. Manufacturer Lot No. LRB No. Used Action  NAIL INTERTAN 10X18 130D 10S - [id number] Nail NAIL INTERTAN 10X18 130D 10S  [employer] [id number] Right 1 Implanted  SCREW LAG COMPR KIT 90/85 - [id number] Screw SCREW LAG COMPR KIT 90/85  [facility] [employer] [id number] Right 1 Implanted  SCREW TRIGEN LOW PROF 5.0X35 - [id number] Screw SCREW TRIGEN LOW PROF 5.0X35  [employer] [id number] Right 1 Implanted     Indications for Surgery: 67 year old female who sustained a ground-level fall with a right intertrochanteric femur fracture.  Due to the unstable nature of her injury I recommend proceeding with cephalomedullary nailing of the right hip.  Risks and benefits were discussed with the patient and her husband.  Risks included but not limited to bleeding, infection, malunion, nonunion, hardware failure, hardware rotation, nerve and blood vessel injury, DVT, even the possibility anesthetic complications.  They agreed to proceed with surgery and consent was obtained.  Operative Findings: Cephalomedullary nailing of right intertrochanteric femur fracture using [employer] InterTAN 10 x 180 mm nail with 90 mm lag screw and 85 mm compression screw.  Procedure: The patient was identified in the preoperative holding area. Consent was confirmed with the patient  and their family and all questions were answered. The operative extremity was marked after confirmation with the patient. she was then brought back to the operating room by our anesthesia colleagues.  She was placed under general anesthetic and carefully transferred over to a Hana table.  All bony prominences were well-padded.  Traction was applied to the right lower extremity and fracture alignment was obtained.  The right lower extremity was then prepped and draped in usual sterile fashion.  A timeout was performed to verify the patient, the procedure, and the extremity.  Preoperative antibiotics were dosed.  A small incision proximal to the greater trochanter was made and carried down through skin and subcutaneous tissue.  A threaded guidewire was placed at the tip of the greater trochanter and advanced into the proximal metaphysis.  I then used an entry reamer to enter the medullary canal.  I then passed a 10 x 180 mm nail attached to the targeting arm.  I then used the targeting arm to place a threaded guidewire into the head/neck segment.  I confirmed adequate tip apex distance using AP and lateral fluoroscopic imaging.  I then measured the length and chose to use a 90 mm lag screw.  I drilled the path for the compression screw and placed an antirotation bar.  I drilled the path for the lag screw and placed the lag screw.  I then placed the compression screw and compressed approximately 5 mm.  I then used the targeting arm to place a lateral to medial distal locking screw.  Final fluoroscopic imaging was obtained and the targeting arm was removed.  The incisions were irrigated and closed with 2-0 Monocryl and Dermabond.  Sterile dressings were applied.  The patient was then awoke from anesthesia and taken to the PACU in stable condition.  Post Op Plan/Instructions: Patient will be weightbearing as tolerated to the right lower extremity.  She will receive postoperative Ancef.  She will be placed on dual  platelet therapy as she was on previous with aspirin and Plavix.  We will have her mobilize with physical and Occupational Therapy.  I was present and performed the entire surgery.  Ulyses Southward, PA-C did assist me throughout the case. An assistant was necessary given the difficulty in approach, maintenance of reduction and ability to instrument the fracture.   Truitt Merle, MD [facility]

## 2023-02-15 NOTE — Assessment & Plan Note (Signed)
Patient takes Evaristo Bury intermittently: sometimes before meals like a sliding scale or if she has forgotten to take it she will take 20u before bed. Last A1C in EPIC 2021 12.8%. She says she sends of a "sample" periodically for checking A1C but hasn't heard results recently.  Plan A1C  Tresiba 15 u qAM  Sliding scale  DM educator consult

## 2023-02-16 DIAGNOSIS — N3000 Acute cystitis without hematuria: Secondary | ICD-10-CM | POA: Diagnosis not present

## 2023-02-16 DIAGNOSIS — J45909 Unspecified asthma, uncomplicated: Secondary | ICD-10-CM | POA: Diagnosis not present

## 2023-02-16 DIAGNOSIS — I2510 Atherosclerotic heart disease of native coronary artery without angina pectoris: Secondary | ICD-10-CM | POA: Diagnosis not present

## 2023-02-16 DIAGNOSIS — S72001A Fracture of unspecified part of neck of right femur, initial encounter for closed fracture: Secondary | ICD-10-CM | POA: Diagnosis not present

## 2023-02-16 LAB — LIPID PANEL
Cholesterol: 114 mg/dL (ref 0–200)
HDL: 53 mg/dL (ref >40)
LDL Cholesterol: 51 mg/dL (ref 0–99)
Total CHOL/HDL Ratio: 2.2 ratio
Triglycerides: 50 mg/dL (ref <150)
VLDL: 10 mg/dL (ref 0–40)

## 2023-02-16 LAB — BASIC METABOLIC PANEL
Anion gap: 9 (ref 5–15)
BUN: 28 mg/dL — ABNORMAL HIGH (ref 8–23)
CO2: 24 mmol/L (ref 22–32)
Calcium: 9.6 mg/dL (ref 8.9–10.3)
Chloride: 103 mmol/L (ref 98–111)
Creatinine, Ser: 1.48 mg/dL — ABNORMAL HIGH (ref 0.44–1.00)
GFR, Estimated: 39 mL/min — ABNORMAL LOW (ref >60)
Glucose, Bld: 115 mg/dL — ABNORMAL HIGH (ref 70–99)
Potassium: 4.3 mmol/L (ref 3.5–5.1)
Sodium: 136 mmol/L (ref 135–145)

## 2023-02-16 LAB — CBC
HCT: 25.2 % — ABNORMAL LOW (ref 36.0–46.0)
Hemoglobin: 8.1 g/dL — ABNORMAL LOW (ref 12.0–15.0)
MCH: 28.7 pg (ref 26.0–34.0)
MCHC: 32.1 g/dL (ref 30.0–36.0)
MCV: 89.4 fL (ref 80.0–100.0)
Platelets: 216 10*3/uL (ref 150–400)
RBC: 2.82 MIL/uL — ABNORMAL LOW (ref 3.87–5.11)
RDW: 12.7 % (ref 11.5–15.5)
WBC: 8 10*3/uL (ref 4.0–10.5)
nRBC: 0 % (ref 0.0–0.2)

## 2023-02-16 LAB — GLUCOSE, CAPILLARY
Glucose-Capillary: 121 mg/dL — ABNORMAL HIGH (ref 70–99)
Glucose-Capillary: 109 mg/dL — ABNORMAL HIGH (ref 70–99)
Glucose-Capillary: 110 mg/dL — ABNORMAL HIGH (ref 70–99)
Glucose-Capillary: 148 mg/dL — ABNORMAL HIGH (ref 70–99)
Glucose-Capillary: 112 mg/dL — ABNORMAL HIGH (ref 70–99)

## 2023-02-16 MED ORDER — CEPHALEXIN 500 MG PO CAPS
500.0000 mg | ORAL_CAPSULE | Freq: Three times a day (TID) | ORAL | Status: AC
  Administered 2023-02-16 – 2023-02-18 (×6): 500 mg via ORAL
  Filled 2023-02-16 (×6): qty 1

## 2023-02-16 MED ORDER — INSULIN ASPART 100 UNIT/ML IJ SOLN: 0.0000 [IU] | Freq: Three times a day (TID) | INTRAMUSCULAR | Status: DC

## 2023-02-16 MED ORDER — KETOROLAC TROMETHAMINE 15 MG/ML IJ SOLN: 15.0000 mg | Freq: Three times a day (TID) | INTRAMUSCULAR | Status: DC | PRN

## 2023-02-16 MED ORDER — SODIUM CHLORIDE 0.9 % IV BOLUS: 500.0000 mL | Freq: Once | INTRAVENOUS | Status: DC

## 2023-02-16 MED ORDER — INSULIN ASPART 100 UNIT/ML IJ SOLN: 0.0000 [IU] | Freq: Every day | INTRAMUSCULAR | Status: DC

## 2023-02-16 NOTE — Evaluation (Signed)
Occupational Therapy Evaluation Patient Details Name: [name] MRN: [id number] DOB: 1956-04-30 Today's Date: 02/16/2023   History of Present Illness Pt is 67 yo who [facility] to [facility] following ground level fall and sustained a R intertrochanteric femur fracture. Pt is currently s/p IM nailing on 02/15/23. 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   Clinical Impression   Pt s/p above diagnosis. Pt in good spirits, minimal pain at rest, increased slightly with activities. Pt lives at home with spouse, daughter and SIL, who can assist as needed, PLOF independent. Pt currently requires min A with LB ADLs, verbal cueing for sequencing, did have some atypical resting positions with arms/hands, appeared anxious/tense throughout session, states no anxiety/tension. Pt would benefit from continued acute therapy to maximize functional independence/safety, HHOT recommended to ensure maximally independent in home environment and to further assess functional needs/safety.       If plan is discharge home, recommend the following: A little help with walking and/or transfers;A little help with bathing/dressing/bathroom;Assistance with cooking/housework;Assist for transportation;Help with stairs or ramp for entrance    Functional Status Assessment  Patient has had a recent decline in their functional status and demonstrates the ability to make significant improvements in function in a reasonable and predictable amount of time.  Equipment Recommendations  Other (comment) (RW)    Recommendations for Other Services       Precautions / Restrictions Precautions Precautions: Fall Restrictions Weight Bearing Restrictions: Yes RLE Weight Bearing: Weight bearing as tolerated      Mobility Bed Mobility Overal bed mobility: Needs Assistance Bed Mobility: Supine to Sit, Sit to Supine      Supine to sit: Min assist Sit to supine: Min assist   General bed mobility comments: min A for power sitting up and verbal cueing for sequencing, had phone in hand and not able to problem solve to place it down before trying to get up.    Transfers Overall transfer level: Needs assistance                 General transfer comment: did not attempt, nausea/dizziness      Balance Overall balance assessment: Needs assistance Sitting-balance support: No upper extremity supported, Feet supported Sitting balance-[name] Scale: Fair Sitting balance - Comments: EOB ADLs, able to bend down to BLEs don/doff socks, assist with RLE                                   ADL either performed or assessed with clinical judgement   ADL Overall ADL's : Needs assistance/impaired Eating/Feeding: Independent;Sitting   Grooming: Set up;Sitting   Upper Body Bathing: Set up;Sitting   Lower Body Bathing: Minimal assistance;Sitting/lateral leans   Upper Body Dressing : Set up;Sitting   Lower Body Dressing: Minimal assistance;Sitting/lateral leans     Toilet Transfer Details (indicate cue type and reason): did not attempt due to dizziniess, nausea, high BP Toileting- Clothing Manipulation and Hygiene: Supervision/safety;Sitting/lateral lean         General ADL Comments: Pt doing well, good BUE strength/ROM, min A for LB dressing, set up for UB ADLs. Pt did not attempt transfer or STS due to nausea and dizziness at EOB.     Vision Baseline Vision/History: 1 Wears glasses Ability to See in Adequate Light: 0 Adequate Patient Visual Report: No change from baseline  Perception         Praxis         Pertinent Vitals/Pain Pain Assessment Pain Assessment: 0-10 Pain Score: 4  Pain Location: R hip Pain Descriptors / Indicators: Aching Pain Intervention(s): Monitored during session     Extremity/Trunk Assessment Upper Extremity Assessment Upper Extremity  Assessment: Overall WFL for tasks assessed           Communication Communication Communication: No apparent difficulties   Cognition Arousal: Alert Behavior During Therapy: WFL for tasks assessed/performed Overall Cognitive Status: Within Functional Limits for tasks assessed                                 General Comments: A/O x4, Pt states she feels like she ahs been forgetting conversations lately, states that she fears may be having memory problems. Pt had difficulty sequencing/problem solving with bed mobility today     General Comments  Pt states she has had vertigo going on for a couple of weeks prior to hospitalization. She has no real reason for her fall and then stated she has had the hiccups for 2 days. Pt was very nauseous and gagging during session intermittently without vomit. BP was taken and initially was 168/82, then 152/82 then 132/80 all while in sitting. BP trends are random going from very low to high in VS documentation.  Pt is moving well following ORIF. Other symptoms are more of a concern. Pt also became unresponsive during session sitting EOB with eyes open and was not responding to questioning. BP was high so orthostatics were not the issue. Pt had horizontal nystagmus while sitting EOB. Pt then started gagging violently. - Sent via Secure chat to Carma Leaven MD, Montez Morita PA    Exercises     Shoulder Instructions      Home Living Family/patient expects to be discharged to:: Private residence Living Arrangements: Spouse/significant other;Children Available Help at Discharge: Family;Available 24 hours/day Type of Home: House Home Access: Stairs to enter [employer] of Steps: 1 step down to mud room then 1 step up Entrance Stairs-Rails: None Home Layout: Two level;Able to live on main level with bedroom/bathroom Alternate Level Stairs-Number of Steps: 13 Alternate Level Stairs-Rails: Can reach both Bathroom Shower/Tub:  Producer, television/film/video: Standard Bathroom Accessibility: Yes   Home Equipment: Shower seat - built in;Grab bars - tub/shower          Prior Functioning/Environment Prior Level of Function : Independent/Modified Independent;Driving             Mobility Comments: pt states she was independent prior to hospitalization ADLs Comments: Independent        OT Problem List: Decreased strength;Decreased range of motion;Decreased activity tolerance;Impaired balance (sitting and/or standing);Decreased knowledge of use of DME or AE;Pain      OT Treatment/Interventions: Self-care/ADL training;Therapeutic exercise;Energy conservation;DME and/or AE instruction;Therapeutic activities;Patient/family education    OT Goals(Current goals can be found in the care plan section) Acute Rehab OT Goals Patient Stated Goal: to return home OT Goal Formulation: With patient Time For Goal Achievement: 03/02/23 Potential to Achieve Goals: Good  OT Frequency: Min 1X/week    Co-evaluation              AM-PAC OT "6 Clicks" Daily Activity     Outcome Measure Help from another person eating meals?: None Help from another person taking care of personal grooming?: A Little Help from another person  toileting, which includes using toliet, bedpan, or urinal?: A Little Help from another person bathing (including washing, rinsing, drying)?: A Little Help from another person to put on and taking off regular upper body clothing?: A Little Help from another person to put on and taking off regular lower body clothing?: A Little 6 Click Score: 19   End of Session Nurse Communication: Mobility status  Activity Tolerance: Patient tolerated treatment well Patient left: in bed;with call bell/phone within reach;with bed alarm set  OT Visit Diagnosis: Unsteadiness on feet (R26.81);Other abnormalities of gait and mobility (R26.89);Muscle weakness (generalized) (M62.81);History of falling  (Z91.81);Pain Pain - Right/Left: Right Pain - part of body: Hip                Time: 0347-4259 OT Time Calculation (min): 28 min Charges:  OT General Charges $OT Visit: 1 Visit OT Evaluation $OT Eval Low Complexity: 1 Low OT Treatments $Self Care/Home Management : 8-22 mins  [place], OTR/L   Alexis Goodell 02/16/2023, 5:12 PM

## 2023-02-16 NOTE — Progress Notes (Signed)
Orthopedic Tech Progress Note Patient Details:  [name] 1955-10-18 [id number]  Patient ID: [name], female   DOB: 12-Jul-1955, 67 y.o.   MRN: [id number] Overhead trapeze applied  Brileigh Sevcik OTR/L 02/16/2023, 7:55 AM

## 2023-02-16 NOTE — Progress Notes (Signed)
[facility] Progress Note  Patient ID: [name] MRN: [id number] DOB/AGE: 08/12/1955 67 y.o.  Subjective:  Doing well Pain controlled Has not used any narcotics post op   ROS As above  Objective:   VITALS:   Vitals:   02/16/23 0322 02/16/23 0343 02/16/23 0823 02/16/23 0852  BP: (!) 89/52 130/70  111/61  Pulse: 90 88 99 93  Resp: 14  16 16   Temp: 97.8 F (36.6 C)  98.5 F (36.9 C) 98.5 F (36.9 C)  TempSrc: Oral  Oral Oral  SpO2: 95%   96%  Weight:      Height:        Estimated body mass index is 23.66 kg/m as calculated from the following:   Height as of this encounter: 5\' 6"  (1.676 m).   Weight as of this encounter: 66.5 kg.   Intake/Output      08/23 0701 08/24 0700 08/24 0701 08/25 0700   I.V. (mL/kg) 601 (9)    IV Piggyback 565    Total Intake(mL/kg) 1166 (17.5)    Urine (mL/kg/hr)     Blood 5    Total Output 5    Net +1161         Urine Occurrence 1 x      LABS  Results for orders placed or performed during the hospital encounter of 02/15/23 (from the past 24 hour(s))  Glucose, capillary     Status: Abnormal   Collection Time: 02/15/23 12:48 PM  Result Value Ref Range   Glucose-Capillary 134 (H) 70 - 99 mg/dL  Glucose, capillary     Status: Abnormal   Collection Time: 02/15/23  1:14 PM  Result Value Ref Range   Glucose-Capillary 119 (H) 70 - 99 mg/dL  Glucose, capillary     Status: Abnormal   Collection Time: 02/15/23  2:59 PM  Result Value Ref Range   Glucose-Capillary 138 (H) 70 - 99 mg/dL  Hemoglobin W0J     Status: Abnormal   Collection Time: 02/15/23  3:26 PM  Result Value Ref Range   Hgb A1c MFr Bld 6.8 (H) 4.8 - 5.6 %   Mean Plasma Glucose 148.46 mg/dL  Glucose, capillary     Status: Abnormal   Collection Time: 02/15/23  8:42 PM  Result Value Ref Range   Glucose-Capillary 159 (H) 70 - 99 mg/dL   Comment 1 Notify RN    Comment 2  Document in Chart   Glucose, capillary     Status: Abnormal   Collection Time: 02/15/23 11:33 PM  Result Value Ref Range   Glucose-Capillary 153 (H) 70 - 99 mg/dL   Comment 1 Notify RN    Comment 2 Document in Chart   Glucose, capillary     Status: Abnormal   Collection Time: 02/16/23  3:04 AM  Result Value Ref Range   Glucose-Capillary 121 (H) 70 - 99 mg/dL   Comment 1 Notify RN    Comment 2 Document in Chart   Lipid panel     Status: None   Collection Time: 02/16/23  4:00 AM  Result Value Ref Range   Cholesterol 114 0 - 200 mg/dL   Triglycerides 50 <811 mg/dL   HDL 53 >91 mg/dL   Total CHOL/HDL Ratio 2.2 RATIO   VLDL 10 0 - 40 mg/dL  LDL Cholesterol 51 0 - 99 mg/dL  Basic metabolic panel     Status: Abnormal   Collection Time: 02/16/23  4:00 AM  Result Value Ref Range   Sodium 136 135 - 145 mmol/L   Potassium 4.3 3.5 - 5.1 mmol/L   Chloride 103 98 - 111 mmol/L   CO2 24 22 - 32 mmol/L   Glucose, Bld 115 (H) 70 - 99 mg/dL   BUN 28 (H) 8 - 23 mg/dL   Creatinine, Ser 2.13 (H) 0.44 - 1.00 mg/dL   Calcium 9.6 8.9 - 08.6 mg/dL   GFR, Estimated 39 (L) >60 mL/min   Anion gap 9 5 - 15  CBC     Status: Abnormal   Collection Time: 02/16/23  4:00 AM  Result Value Ref Range   WBC 8.0 4.0 - 10.5 K/uL   RBC 2.82 (L) 3.87 - 5.11 MIL/uL   Hemoglobin 8.1 (L) 12.0 - 15.0 g/dL   HCT 57.8 (L) 46.9 - 62.9 %   MCV 89.4 80.0 - 100.0 fL   MCH 28.7 26.0 - 34.0 pg   MCHC 32.1 30.0 - 36.0 g/dL   RDW 52.8 41.3 - 24.4 %   Platelets 216 150 - 400 K/uL   nRBC 0.0 0.0 - 0.2 %  Glucose, capillary     Status: Abnormal   Collection Time: 02/16/23  8:24 AM  Result Value Ref Range   Glucose-Capillary 109 (H) 70 - 99 mg/dL  Glucose, capillary     Status: Abnormal   Collection Time: 02/16/23 11:36 AM  Result Value Ref Range   Glucose-Capillary 110 (H) 70 - 99 mg/dL     PHYSICAL EXAM:   Gen: sitting up in bed, looks good, NAD  Lungs: unlabored Cardiac: reg Ext:       Right Lower  Extremity Dressing R thigh is clean, dry and intact  Extremity is warm  No DCT  Compartments are soft  DPN, SPN, TN sensory functions are intact  EHL, FHL, lesser toe motor functions intact  Ankle flexion, extension, inversion eversion intact  + DP pulse   Assessment/Plan: 1 Day Post-Op   Principal Problem:   Hip fracture (HCC) Active Problems:   CAD (coronary artery disease)   DM (diabetes mellitus), type 2 (HCC)   HLD (hyperlipidemia)   Asthma   HTN (hypertension)   UTI (urinary tract infection)   Anti-infectives (From admission, onward)    Start     Dose/Rate Route Frequency Ordered Stop   02/15/23 2000  ceFAZolin (ANCEF) IVPB 2g/100 mL premix        2 g 200 mL/hr over 30 Minutes Intravenous Every 8 hours 02/15/23 1526 02/16/23 1205   02/15/23 1100  sulfamethoxazole-trimethoprim (BACTRIM DS) 800-160 MG per tablet 1 tablet        1 tablet Oral Every 12 hours 02/15/23 1001 02/20/23 0959   02/15/23 1100  ceFAZolin (ANCEF) IVPB 2g/100 mL premix        2 g 200 mL/hr over 30 Minutes Intravenous On call to O.R. 02/15/23 1009 02/15/23 1210     .  POD/HD#: 1  67 y/o female s/p fall with R intertrochanteric femur fracture s/p intramedullary nailing   Weightbearing: WBAT RLE ROM: unrestricted ROM R hip and knee Insicional and dressing care: Daily dressing changes with 4 x 4 gauze and tape or a silicone foam dressing starting on 02/17/2023 Pain management: Multimodal, minimize narcotics ID: Perioperative antibiotics Impediments to Fracture Healing: Diabetes, poor bone quality Bone Health/Optimization: Will check vitamin  D levels  Orthopedic device(s):  Walker  Showering: Okay to shower once incisions are dry and there is no drainage.  Clean with soap and water only  VTE prophylaxis: Plavix and Eliquis  Dispo: Therapy evaluations  Follow - up plan: 2 weeks with ortho for post op check     Mearl Latin, PA-C [phone] (C) 02/16/2023, 12:23 PM  [facility] [address] [place] [address] [phone] [phone] (F)    After 5pm and on the weekends please log on to Amion, go to orthopaedics and the look under the [facility] Call for the provider(s) on call. You can also call our office at [phone] and then follow the prompts to be connected to the call team.  Patient ID: [name], female   DOB: 1955-12-03, 67 y.o.   MRN: [id number]

## 2023-02-16 NOTE — Progress Notes (Signed)
PROGRESS NOTE    [name]  VWU:[id number] DOB: [birth year] DOA: 02/15/2023 PCP: Jerl Mina, MD   Brief Narrative:  [name], a 67 y/o woman with DM, HLD, HTN had a mechanical fall at home striking he head and right hip. She was seen at outside ED where she had nl CT head, CT c-spine. Hip xray revealed comminuted intratrochanteric fx right. She was transferred to [facility] for ortho consult and repair.  Hospitalist called for admission.    Assessment & Plan:   Principal Problem:   Hip fracture (HCC) Active Problems:   UTI (urinary tract infection)   CAD (coronary artery disease)   DM (diabetes mellitus), type 2 (HCC)   HLD (hyperlipidemia)   Asthma   HTN (hypertension)  Hip fracture (HCC) -Patient with traumatic intratrochanteric fx.  -Status post IM nail 02/15/2023 -Pain currently well-controlled, follow PT OT for placement recommendations -Attempt to wean narcotics aggressively given patient's labile hypotension overnight in the setting of narcotics.   UTI (urinary tract infection), POA Presumed positive given abnormal UA and symptoms, continue Bactrim DS x 5 days  HTN (hypertension) -BP elevated at admission likely secondary to pain, subsequently hypotensive after narcotic administration anesthesia.  Currently appears to be back to baseline. -Continue home regimen -follow closely as patient reports she is not well-controlled at home despite increasing her blood pressure medications   Asthma Continue inhalers, nebs scheduled and as needed   HLD (hyperlipidemia) Continue statin, lipid panel unremarkable   DM (diabetes mellitus), type 2 (HCC) Patient takes [place] somewhat intermittently per home regimen A1c 6.8 Tresiba daily with sliding scale and hypoglycemic protocol on board   CAD (coronary artery disease) Stable, continue statin, aspirin, losartan  DVT prophylaxis: SCDs Start: 02/15/23 1527 SCDs Start: 02/15/23 0326 Code Status:   Code Status: Full  Code Family Communication: None present  Status is: Inpatient  Dispo: The patient is from: Home              Anticipated d/c is to: To be determined              Anticipated d/c date is: 24 to 48 hours              Patient currently not medically stable for discharge  Consultants:  Orthopedic surgery  Procedures:  IM nail,  Antimicrobials:  Perioperatively  Subjective: No acute issues or events overnight, blood pressure somewhat low in the setting of anesthesia narcotics but patient remains asymptomatic.  Otherwise denies nausea vomiting diarrhea constipation headache fevers chills or chest pain.  Objective: Vitals:   02/15/23 2039 02/16/23 0300 02/16/23 0322 02/16/23 0343  BP: (!) 93/55 (!) 89/52 (!) 89/52 130/70  Pulse: 88 89 90 88  Resp: 18 14 14    Temp: 97.8 F (36.6 C)  97.8 F (36.6 C)   TempSrc: Oral Oral Oral   SpO2: 100%  95%   Weight:      Height:        Intake/Output Summary (Last 24 hours) at 02/16/2023 0801 Last data filed at 02/15/2023 1614 Gross per 24 hour  Intake 1166.01 ml  Output 5 ml  Net 1161.01 ml   Filed Weights   02/15/23 0202 02/15/23 0443 02/15/23 1035  Weight: 68 kg 66.5 kg 66.5 kg    Examination:  General:  Pleasantly resting in bed, No acute distress. HEENT:  Normocephalic atraumatic.  Sclerae nonicteric, noninjected.  Extraocular movements intact bilaterally. Neck:  Without mass or deformity.  Trachea is midline. Lungs:  Clear to  auscultate bilaterally without rhonchi, wheeze, or rales. Heart:  Regular rate and rhythm.  Without murmurs, rubs, or gallops. Abdomen:  Soft, nontender, nondistended.  Without guarding or rebound. Extremities: Without cyanosis, clubbing, edema, or obvious deformity. Skin:  Warm and dry, no erythema.  Data Reviewed: I have personally reviewed following labs and imaging studies  CBC: Recent Labs  Lab 02/15/23 0200 02/16/23 0400  WBC 9.8 8.0  NEUTROABS 5.9  --   HGB 10.8* 8.1*  HCT 33.4* 25.2*   MCV 86.3 89.4  PLT 268 216   Basic Metabolic Panel: Recent Labs  Lab 02/15/23 0200 02/16/23 0400  NA 136 136  K 3.8 4.3  CL 103 103  CO2 24 24  GLUCOSE 175* 115*  BUN 16 28*  CREATININE 0.90 1.48*  CALCIUM 9.7 9.6   GFR: Estimated Creatinine Clearance: 34.5 mL/min (A) (by C-G formula based on SCr of 1.48 mg/dL (H)). Liver Function Tests: Recent Labs  Lab 02/15/23 0200  AST 16  ALT 16  ALKPHOS 76  BILITOT 0.8  PROT 6.5  ALBUMIN 3.6   No results for input(s): "LIPASE", "AMYLASE" in the last 168 hours. No results for input(s): "AMMONIA" in the last 168 hours. Coagulation Profile: Recent Labs  Lab 02/15/23 0200  INR 1.1   HbA1C: Recent Labs    02/15/23 1526  HGBA1C 6.8*   Lipid Profile: Recent Labs    02/16/23 0400  CHOL 114  HDL 53  LDLCALC 51  TRIG 50  CHOLHDL 2.2   Radiology Studies: DG HIP PORT UNILAT W OR W/O PELVIS 1V RIGHT  Result Date: 02/15/2023 CLINICAL DATA:  Fracture.  Postoperative. EXAM: DG HIP (WITH OR WITHOUT PELVIS) 1V PORT RIGHT COMPARISON:  Pelvis and right hip radiographs 02/15/2023 FINDINGS: Interval cephalomedullary nail fixation of the previously seen acute, comminuted proximal right femoral intertrochanteric fracture. Expected postoperative lateral pelvis and hip subcutaneous air. Mild bilateral superior femoroacetabular joint space narrowing. Mild bilateral sacroiliac subchondral sclerosis. The pubic symphysis joint space is maintained. Moderate atherosclerotic calcifications. Numerous vascular phleboliths overlie the pelvis. IMPRESSION: Interval cephalomedullary nail fixation of the previously seen acute, comminuted proximal right femoral intertrochanteric fracture. No evidence of hardware failure. Electronically Signed   By: Neita Garnet M.D.   On: 02/15/2023 13:45   DG HIP UNILAT WITH PELVIS 2-3 VIEWS RIGHT  Result Date: 02/15/2023 CLINICAL DATA:  Fracture, left hip surgery. EXAM: DG HIP (WITH OR WITHOUT PELVIS) 2-3V RIGHT  COMPARISON:  Preoperative imaging FINDINGS: Five fluoroscopic spot views of the right hip obtained in the operating room. Femoral intramedullary nail with trans trochanteric and distal locking screw fixation traverse intertrochanteric femur fracture. Fluoroscopy time 53.7 seconds. Dose 5.49 mGy. IMPRESSION: Intraoperative fluoroscopy during ORIF of right intertrochanteric femur fracture. Electronically Signed   By: Narda Rutherford M.D.   On: 02/15/2023 13:05   DG C-Arm 1-60 Min-No Report  Result Date: 02/15/2023 Fluoroscopy was utilized by the requesting physician.  No radiographic interpretation.   DG Knee Right Port  Result Date: 02/15/2023 CLINICAL DATA:  [id number]. Right hip fracture. Check for right knee fracture. EXAM: PORTABLE RIGHT KNEE - 1-2 VIEW COMPARISON:  None Available. FINDINGS: There is osteopenia without evidence of fractures. No suprapatellar bursal effusion is seen. Patchy calcific plaques in the distal femoral, popliteal and popliteal trifurcation arteries with otherwise unremarkable soft tissues. There is mild narrowing of the medial femorotibial joint with trace marginal spurring. Other joint spaces are maintained in the knee. There is no erosive arthropathy. Interosseous alignment is normal. IMPRESSION: 1. Osteopenia  and mild degenerative change without evidence of fractures. 2. Peripheral vascular disease. Electronically Signed   By: Almira Bar M.D.   On: 02/15/2023 07:42   DG Hip Unilat W or Wo Pelvis 2-3 Views Right  Result Date: 02/15/2023 CLINICAL DATA:  Fall fall, right hip pain EXAM: DG HIP (WITH OR WITHOUT PELVIS) 2-3V RIGHT COMPARISON:  None Available. FINDINGS: Acute, comminuted intratrochanteric fracture of the right hip noted with avulsion of the lesser trochanter and mild varus angulation of the distal fracture fragment. Femoral head is still seated within the right acetabulum. Pelvis and visualized left hip are intact. Vascular calcifications are noted. IMPRESSION:  1. Acute, comminuted intratrochanteric fracture of the right hip. Electronically Signed   By: Helyn Numbers M.D.   On: 02/15/2023 02:45   DG Chest Port 1 View  Result Date: 02/15/2023 CLINICAL DATA:  Fall, chest trauma EXAM: PORTABLE CHEST 1 VIEW COMPARISON:  03/18/2019 FINDINGS: Lungs are well expanded, symmetric, and clear. No pneumothorax or pleural effusion. Cardiac size within normal limits. Calcification of the mitral valve annulus noted. Pulmonary vascularity is normal. Osseous structures are age-appropriate. No acute bone abnormality. IMPRESSION: No active disease. Electronically Signed   By: Helyn Numbers M.D.   On: 02/15/2023 02:45   CT Head Wo Contrast  Result Date: 02/15/2023 CLINICAL DATA:  Fall, on blood thinners, head and neck trauma EXAM: CT HEAD WITHOUT CONTRAST CT CERVICAL SPINE WITHOUT CONTRAST TECHNIQUE: Multidetector CT imaging of the head and cervical spine was performed following the standard protocol without intravenous contrast. Multiplanar CT image reconstructions of the cervical spine were also generated. RADIATION DOSE REDUCTION: This exam was performed according to the departmental dose-optimization program which includes automated exposure control, adjustment of the mA and/or kV according to patient size and/or use of iterative reconstruction technique. COMPARISON:  03/17/2019 CT head, no prior CT cervical spine FINDINGS: CT HEAD FINDINGS Brain: No evidence of acute infarct, hemorrhage, mass, mass effect, or midline shift. No hydrocephalus or extra-axial fluid collection. Age-related cerebral atrophy. Encephalomalacia in the right occipital lobe, unchanged. Remote right thalamic lacunar infarct. Vascular: No hyperdense vessel. Atherosclerotic calcifications in the intracranial carotid and vertebral arteries. Skull: Negative for fracture or focal lesion. Right frontal scalp hematoma. Sinuses/Orbits: Mucosal thickening in the ethmoid air cells. Status post bilateral lens  replacements. Other: The mastoid air cells are well aerated. CT CERVICAL SPINE FINDINGS Alignment: No traumatic listhesis. Skull base and vertebrae: No acute fracture or suspicious osseous lesion. Soft tissues and spinal canal: No prevertebral fluid or swelling. No visible canal hematoma. Disc levels: Degenerative changes in the cervical spine. Mild spinal canal stenosis at C5-C6 and C6-C7. Upper chest: No focal pulmonary opacity or pleural effusion. IMPRESSION: 1. No acute intracranial process. Right frontal scalp hematoma. 2. No acute fracture or traumatic listhesis in the cervical spine. Electronically Signed   By: Wiliam Ke M.D.   On: 02/15/2023 02:42   CT Cervical Spine Wo Contrast  Result Date: 02/15/2023 CLINICAL DATA:  Fall, on blood thinners, head and neck trauma EXAM: CT HEAD WITHOUT CONTRAST CT CERVICAL SPINE WITHOUT CONTRAST TECHNIQUE: Multidetector CT imaging of the head and cervical spine was performed following the standard protocol without intravenous contrast. Multiplanar CT image reconstructions of the cervical spine were also generated. RADIATION DOSE REDUCTION: This exam was performed according to the departmental dose-optimization program which includes automated exposure control, adjustment of the mA and/or kV according to patient size and/or use of iterative reconstruction technique. COMPARISON:  03/17/2019 CT head, no prior CT cervical  spine FINDINGS: CT HEAD FINDINGS Brain: No evidence of acute infarct, hemorrhage, mass, mass effect, or midline shift. No hydrocephalus or extra-axial fluid collection. Age-related cerebral atrophy. Encephalomalacia in the right occipital lobe, unchanged. Remote right thalamic lacunar infarct. Vascular: No hyperdense vessel. Atherosclerotic calcifications in the intracranial carotid and vertebral arteries. Skull: Negative for fracture or focal lesion. Right frontal scalp hematoma. Sinuses/Orbits: Mucosal thickening in the ethmoid air cells. Status post  bilateral lens replacements. Other: The mastoid air cells are well aerated. CT CERVICAL SPINE FINDINGS Alignment: No traumatic listhesis. Skull base and vertebrae: No acute fracture or suspicious osseous lesion. Soft tissues and spinal canal: No prevertebral fluid or swelling. No visible canal hematoma. Disc levels: Degenerative changes in the cervical spine. Mild spinal canal stenosis at C5-C6 and C6-C7. Upper chest: No focal pulmonary opacity or pleural effusion. IMPRESSION: 1. No acute intracranial process. Right frontal scalp hematoma. 2. No acute fracture or traumatic listhesis in the cervical spine. Electronically Signed   By: Wiliam Ke M.D.   On: 02/15/2023 02:42        Scheduled Meds:  acetaminophen  500 mg Oral QID   aspirin EC  81 mg Oral Daily   atorvastatin  40 mg Oral q1800   clopidogrel  75 mg Oral Daily   docusate sodium  100 mg Oral BID   feeding supplement (GLUCERNA SHAKE)  237 mL Oral TID BM   insulin aspart  0-15 Units Subcutaneous Q4H   insulin glargine-yfgn  15 Units Subcutaneous QHS   ketorolac  15 mg Intravenous Q6H   losartan  50 mg Oral QPM   sulfamethoxazole-trimethoprim  1 tablet Oral Q12H   Continuous Infusions:  sodium chloride 50 mL/hr at 02/15/23 1613    ceFAZolin (ANCEF) IV 2 g (02/16/23 0410)   methocarbamol (ROBAXIN) IV       LOS: 1 day   Time spent:  Azucena Fallen, DO [facility]  If 7PM-7AM, please contact night-coverage [email or web address]  02/16/2023, 8:01 AM

## 2023-02-16 NOTE — Evaluation (Addendum)
Physical Therapy Evaluation Patient Details Name: [name] MRN: [id number] DOB: 10/27/1955 Today's Date: 02/16/2023  History of Present Illness  Pt is 67 yo who presented to [facility] following ground level fall and sustained a R intertrochanteric femur fracture. Pt is currently s/p IM nailing on 02/15/23. 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  Clinical Impression  Pt is presenting below baseline level of functioning. Pt was independent prior to hospitalization. Currently pt requires Min A for bed mobility, sit to stand and was unable to perform gait due to unrelated complications (see general comments). Due to pt current functional status, home set up and available assistance at home recommending skilled physical therapy services 3x/weekly on discharge from acute care hospital setting once medically stable.       If plan is discharge home, recommend the following: Assistance with cooking/housework;Help with stairs or ramp for entrance     Equipment Recommendations Rolling walker (2 wheels)     Functional Status Assessment Patient has had a recent decline in their functional status and demonstrates the ability to make significant improvements in function in a reasonable and predictable amount of time.     Precautions / Restrictions Precautions Precautions: Fall Restrictions Weight Bearing Restrictions: Yes RLE Weight Bearing: Weight bearing as tolerated      Mobility  Bed Mobility Overal bed mobility: Needs Assistance Bed Mobility: Supine to Sit, Sit to Supine     Supine to sit: Min assist Sit to supine: Min assist   General bed mobility comments: Min A for trunk and RLE progressing with verbal cues for sequencing for 2x supine<>sitting 1x total assist due to non-responsive    Transfers Overall transfer level: Needs assistance Equipment used: Rolling walker (2  wheels) Transfers: Sit to/from Stand Sit to Stand: Min assist           General transfer comment: Min A for initial extension of hips to get to standing and stabilization in standing.    Ambulation/Gait               General Gait Details: Unable to progress today. See general comments.      Balance Overall balance assessment: Needs assistance Sitting-balance support: Bilateral upper extremity supported, Single extremity supported Sitting balance-[name] Scale: Fair Sitting balance - Comments: CGA for stability initially and during gagging/vomiting and dizziness.   Standing balance support: Bilateral upper extremity supported Standing balance-[name] Scale: Fair Standing balance comment: no overt LOB did not perform dynamic mobility. Unable to take a step         Pertinent Vitals/Pain Pain Assessment Pain Assessment: 0-10 Pain Score: 3  Pain Location: R hip Pain Descriptors / Indicators: Aching Pain Intervention(s): Premedicated before session, Monitored during session    Home Living Family/patient expects to be discharged to:: Private residence Living Arrangements: Spouse/significant other;Children (daughter and SIL) Available Help at Discharge: Family;Available 24 hours/day Type of Home: House Home Access: Stairs to enter Entrance Stairs-Rails: None Entrance Stairs-Number of Steps: 1 step down to mud room then 1 step up Alternate Level Stairs-Number of Steps: 13 Home Layout: Two level;Able to live on main level with bedroom/bathroom Home Equipment: Shower seat - built in;Grab bars - tub/shower      Prior Function Prior Level of Function : Independent/Modified Independent;Driving             Mobility Comments: pt states she was independent prior to hospitalization ADLs Comments: Independent  Extremity/Trunk Assessment   Upper Extremity Assessment Upper Extremity Assessment: Defer to OT evaluation    Lower Extremity Assessment Lower Extremity  Assessment: Generalized weakness;RLE deficits/detail RLE Deficits / Details: Recent ORIF of the RLE    Cervical / Trunk Assessment Cervical / Trunk Assessment: Normal  Communication   Communication Communication: No apparent difficulties Cueing Techniques: Verbal cues;Visual cues  Cognition Arousal: Alert Behavior During Therapy: WFL for tasks assessed/performed Overall Cognitive Status: Within Functional Limits for tasks assessed      General Comments General comments (skin integrity, edema, etc.): Pt states she has had vertigo going on for a couple of weeks prior to hospitalization. She has no real reason for her fall and then stated she has had the hiccups for 2 days. Pt was very nauseous and gagging during session intermittently without vomit. BP was taken and initially was 168/82, then 152/82 then 132/80 all while in sitting. BP trends are random going from very low to high in VS documentation.  Pt is moving well following ORIF. Other symptoms are more of a concern. Pt also became unresponsive during session sitting EOB with eyes open and was not responding to questioning. BP was high so orthostatics were not the issue. Pt had horizontal nystagmus while sitting EOB. Pt then started gagging violently. - Sent via Secure chat to Carma Leaven MD, Montez Morita PA        Assessment/Plan    PT Assessment Patient needs continued PT services  PT Problem List Decreased strength;Decreased mobility;Decreased activity tolerance;Decreased balance;Pain       PT Treatment Interventions DME instruction;Therapeutic exercise;Gait training;Balance training;Stair training;Neuromuscular re-education;Functional mobility training;Therapeutic activities;Patient/family education    PT Goals (Current goals can be found in the Care Plan section)  Acute Rehab PT Goals Patient Stated Goal: Return home with family PT Goal Formulation: With patient Time For Goal Achievement: 03/02/23 Potential to Achieve  Goals: Good    Frequency Min 1X/week        AM-PAC PT "6 Clicks" Mobility  Outcome Measure Help needed turning from your back to your side while in a flat bed without using bedrails?: A Little Help needed moving from lying on your back to sitting on the side of a flat bed without using bedrails?: A Little Help needed moving to and from a bed to a chair (including a wheelchair)?: A Little Help needed standing up from a chair using your arms (e.g., wheelchair or bedside chair)?: A Little Help needed to walk in hospital room?: A Little Help needed climbing 3-5 steps with a railing? : A Lot 6 Click Score: 17    End of Session Equipment Utilized During Treatment: Gait belt Activity Tolerance: Other (comment) (pt was gagging violently and was very dizzy see general comments) Patient left: in bed;with family/visitor present;with call bell/phone within reach Nurse Communication: Mobility status;Other (comment) (see general comments) PT Visit Diagnosis: Other abnormalities of gait and mobility (R26.89)    Time: 1220-1315 PT Time Calculation (min) (ACUTE ONLY): 55 min   Charges:   PT Evaluation $PT Eval Low Complexity: 1 Low PT Treatments $Therapeutic Activity: 38-52 mins PT General Charges $$ ACUTE PT VISIT: 1 Visit        Harrel Carina, DPT, CLT  Acute Rehabilitation Services Office: [phone] (Secure chat preferred)   Claudia Desanctis 02/16/2023, 1:36 PM

## 2023-02-17 DIAGNOSIS — I10 Essential (primary) hypertension: Secondary | ICD-10-CM

## 2023-02-17 DIAGNOSIS — N3000 Acute cystitis without hematuria: Secondary | ICD-10-CM

## 2023-02-17 DIAGNOSIS — I2510 Atherosclerotic heart disease of native coronary artery without angina pectoris: Secondary | ICD-10-CM

## 2023-02-17 DIAGNOSIS — Z794 Long term (current) use of insulin: Secondary | ICD-10-CM

## 2023-02-17 DIAGNOSIS — S72001A Fracture of unspecified part of neck of right femur, initial encounter for closed fracture: Secondary | ICD-10-CM | POA: Diagnosis not present

## 2023-02-17 DIAGNOSIS — E1159 Type 2 diabetes mellitus with other circulatory complications: Secondary | ICD-10-CM

## 2023-02-17 DIAGNOSIS — J45909 Unspecified asthma, uncomplicated: Secondary | ICD-10-CM

## 2023-02-17 DIAGNOSIS — E785 Hyperlipidemia, unspecified: Secondary | ICD-10-CM

## 2023-02-17 LAB — GLUCOSE, CAPILLARY
Glucose-Capillary: 77 mg/dL (ref 70–99)
Glucose-Capillary: 73 mg/dL (ref 70–99)
Glucose-Capillary: 106 mg/dL — ABNORMAL HIGH (ref 70–99)
Glucose-Capillary: 131 mg/dL — ABNORMAL HIGH (ref 70–99)

## 2023-02-17 LAB — CBC
HCT: 22.8 % — ABNORMAL LOW (ref 36.0–46.0)
Hemoglobin: 7.4 g/dL — ABNORMAL LOW (ref 12.0–15.0)
MCH: 27.7 pg (ref 26.0–34.0)
MCHC: 32.5 g/dL (ref 30.0–36.0)
MCV: 85.4 fL (ref 80.0–100.0)
Platelets: 232 10*3/uL (ref 150–400)
RBC: 2.67 MIL/uL — ABNORMAL LOW (ref 3.87–5.11)
RDW: 12.7 % (ref 11.5–15.5)
WBC: 6.5 10*3/uL (ref 4.0–10.5)
nRBC: 0 % (ref 0.0–0.2)

## 2023-02-17 LAB — VITAMIN D 25 HYDROXY (VIT D DEFICIENCY, FRACTURES): Vit D, 25-Hydroxy: 49.72 ng/mL (ref 30–100)

## 2023-02-17 NOTE — TOC CAGE-AID Note (Signed)
Transition of Care [facility]) - CAGE-AID Screening   Patient Details  Name: [name] MRN: [id number] Date of Birth: [birth year]   Hewitt Shorts, RN Trauma Response Nurse Phone Number: [phone] 02/17/2023, 2:36 PM       CAGE-AID Screening:    Have You Ever Felt You Ought to Cut Down on Your Drinking or Drug Use?: No Have People Annoyed You By Critizing Your Drinking Or Drug Use?: No Have You Felt Bad Or Guilty About Your Drinking Or Drug Use?: No Have You Ever Had a Drink or Used Drugs First Thing In The Morning to Steady Your Nerves or to Get Rid of a Hangover?: No CAGE-AID Score: 0  Substance Abuse Education Offered: No (Pt denies using alcohol or smoking.)

## 2023-02-17 NOTE — Progress Notes (Signed)
PROGRESS NOTE    [name]  ZOX:[id number] DOB: 02/13/1956 DOA: 02/15/2023 PCP: Jerl Mina, MD   Brief Narrative:  Mrs. Faubel, a 67 y/o woman with DM, HLD, HTN had a mechanical fall at home striking he head and right hip. She was seen at outside ED where she had nl CT head, CT c-spine. Hip xray revealed comminuted intratrochanteric fx right. She was transferred to [facility] for ortho consult and repair.  Hospitalist called for admission.  Assessment & Plan:   Principal Problem:   Hip fracture (HCC) Active Problems:   UTI (urinary tract infection)   CAD (coronary artery disease)   DM (diabetes mellitus), type 2 (HCC)   HLD (hyperlipidemia)   Asthma   HTN (hypertension)  Hip fracture (HCC) -Patient with traumatic intratrochanteric fx.  -Status post IM nail 02/15/2023 -Pain currently well-controlled, follow PT/OT for placement recommendations -Attempt to wean narcotics aggressively given patient's labile hypotension overnight in the setting of narcotics.   Anemia of chronic disease, chronic Acute blood loss anemia, stable -Hemoglobin downtrending from baseline, likely partially hemodilutional versus secondary to phlebotomy -Follow iron panel, B12, folate -Transition to q72 hour phlebotomy given patient's wishes as she is not agreeable to transfusion  UTI (urinary tract infection), POA Presumed positive given abnormal UA and symptoms, continue cephalexin x3 days(Bactrim discontinued) based  HTN (hypertension) -BP elevated at admission likely secondary to pain, subsequently hypotensive after narcotic administration anesthesia.  Currently appears to be back to baseline. -Continue home regimen -follow closely as patient reports she is not well-controlled at home despite increasing her blood pressure medications   Asthma Continue inhalers, nebs scheduled and as needed   HLD (hyperlipidemia) Continue statin, lipid panel unremarkable   DM (diabetes mellitus), type 2  (HCC) Patient takes [place] somewhat intermittently per home regimen A1c 6.8 Tresiba daily with sliding scale and hypoglycemic protocol on board   CAD (coronary artery disease) Stable, continue statin, aspirin, losartan  DVT prophylaxis: SCDs Start: 02/15/23 1527 SCDs Start: 02/15/23 0326 Code Status:   Code Status: Full Code Family Communication: None present  Status is: Inpatient  Dispo: The patient is from: Home              Anticipated d/c is to: To be determined              Anticipated d/c date is: 24 to 48 hours              Patient currently not medically stable for discharge  Consultants:  Orthopedic surgery  Procedures:  IM nail  Antimicrobials:  Perioperatively; Bactrim-> cephalexin  Subjective: Noted downtrending hemoglobin overnight, patient indicates she does not want to transfusion -otherwise no new symptoms or events, she continues to feel somewhat vertiginous but otherwise pain is improving.  She has no noted nausea vomiting diarrhea constipation headache fevers chills or chest pain  Objective: Vitals:   02/16/23 0852 02/16/23 1630 02/16/23 2046 02/17/23 0516  BP: 111/61 (!) 148/79 (!) 119/58 (!) 123/53  Pulse: 93 95 94 89  Resp: 16 16 18 16   Temp: 98.5 F (36.9 C) 98.7 F (37.1 C) 98.3 F (36.8 C)   TempSrc: Oral Oral Oral   SpO2: 96% (!) 85% 98% 96%  Weight:      Height:        Intake/Output Summary (Last 24 hours) at 02/17/2023 0824 Last data filed at 02/16/2023 1700 Gross per 24 hour  Intake 480 ml  Output --  Net 480 ml   [employer]  02/15/23 0202 02/15/23 0443 02/15/23 1035  Weight: 68 kg 66.5 kg 66.5 kg    Examination:  General:  Pleasantly resting in bed, No acute distress. HEENT:  Normocephalic atraumatic.  Sclerae nonicteric, noninjected.  Extraocular movements intact bilaterally. Neck:  Without mass or deformity.  Trachea is midline. Lungs:  Clear to auscultate bilaterally without rhonchi, wheeze, or rales. Heart:   Regular rate and rhythm.  Without murmurs, rubs, or gallops. Abdomen:  Soft, nontender, nondistended.  Without guarding or rebound. Extremities: Without cyanosis, clubbing, edema, or obvious deformity. Skin:  Warm and dry, no erythema.  Data Reviewed: I have personally reviewed following labs and imaging studies  CBC: Recent Labs  Lab 02/15/23 0200 02/16/23 0400 02/17/23 0453  WBC 9.8 8.0 6.5  NEUTROABS 5.9  --   --   HGB 10.8* 8.1* 7.4*  HCT 33.4* 25.2* 22.8*  MCV 86.3 89.4 85.4  PLT 268 216 232   Basic Metabolic Panel: Recent Labs  Lab 02/15/23 0200 02/16/23 0400  NA 136 136  K 3.8 4.3  CL 103 103  CO2 24 24  GLUCOSE 175* 115*  BUN 16 28*  CREATININE 0.90 1.48*  CALCIUM 9.7 9.6   GFR: Estimated Creatinine Clearance: 34.5 mL/min (A) (by C-G formula based on SCr of 1.48 mg/dL (H)). Liver Function Tests: Recent Labs  Lab 02/15/23 0200  AST 16  ALT 16  ALKPHOS 76  BILITOT 0.8  PROT 6.5  ALBUMIN 3.6   No results for input(s): "LIPASE", "AMYLASE" in the last 168 hours. No results for input(s): "AMMONIA" in the last 168 hours. Coagulation Profile: Recent Labs  Lab 02/15/23 0200  INR 1.1   HbA1C: Recent Labs    02/15/23 1526  HGBA1C 6.8*   Lipid Profile: Recent Labs    02/16/23 0400  CHOL 114  HDL 53  LDLCALC 51  TRIG 50  CHOLHDL 2.2   Radiology Studies: DG HIP PORT UNILAT W OR W/O PELVIS 1V RIGHT  Result Date: 02/15/2023 CLINICAL DATA:  Fracture.  Postoperative. EXAM: DG HIP (WITH OR WITHOUT PELVIS) 1V PORT RIGHT COMPARISON:  Pelvis and right hip radiographs 02/15/2023 FINDINGS: Interval cephalomedullary nail fixation of the previously seen acute, comminuted proximal right femoral intertrochanteric fracture. Expected postoperative lateral pelvis and hip subcutaneous air. Mild bilateral superior femoroacetabular joint space narrowing. Mild bilateral sacroiliac subchondral sclerosis. The pubic symphysis joint space is maintained. Moderate  atherosclerotic calcifications. Numerous vascular phleboliths overlie the pelvis. IMPRESSION: Interval cephalomedullary nail fixation of the previously seen acute, comminuted proximal right femoral intertrochanteric fracture. No evidence of hardware failure. Electronically Signed   By: Neita Garnet M.D.   On: 02/15/2023 13:45   DG HIP UNILAT WITH PELVIS 2-3 VIEWS RIGHT  Result Date: 02/15/2023 CLINICAL DATA:  Fracture, left hip surgery. EXAM: DG HIP (WITH OR WITHOUT PELVIS) 2-3V RIGHT COMPARISON:  Preoperative imaging FINDINGS: Five fluoroscopic spot views of the right hip obtained in the operating room. Femoral intramedullary nail with trans trochanteric and distal locking screw fixation traverse intertrochanteric femur fracture. Fluoroscopy time 53.7 seconds. Dose 5.49 mGy. IMPRESSION: Intraoperative fluoroscopy during ORIF of right intertrochanteric femur fracture. Electronically Signed   By: Narda Rutherford M.D.   On: 02/15/2023 13:05   DG C-Arm 1-60 Min-No Report  Result Date: 02/15/2023 Fluoroscopy was utilized by the requesting physician.  No radiographic interpretation.        Scheduled Meds:  acetaminophen  500 mg Oral QID   aspirin EC  81 mg Oral Daily   atorvastatin  40 mg Oral  q1800   cephALEXin  500 mg Oral Q8H   clopidogrel  75 mg Oral Daily   docusate sodium  100 mg Oral BID   feeding supplement (GLUCERNA SHAKE)  237 mL Oral TID BM   insulin aspart  0-5 Units Subcutaneous QHS   insulin aspart  0-6 Units Subcutaneous TID WC   insulin glargine-yfgn  15 Units Subcutaneous QHS   losartan  50 mg Oral QPM   Continuous Infusions:  sodium chloride 50 mL/hr at 02/15/23 1613   methocarbamol (ROBAXIN) IV       LOS: 2 days   Time spent:  Azucena Fallen, DO [facility]  If 7PM-7AM, please contact night-coverage [email or web address]  02/17/2023, 8:24 AM

## 2023-02-17 NOTE — Progress Notes (Signed)
Orthopaedic Trauma Service Progress Note  Patient ID: [name] MRN: [id number] DOB/AGE: [birth year] 67 y.o.  Subjective:  Pain controlled Ortho issues stable  She and her husband live with daughter and son-in-law  Does not use ambulatory aids at baseline   States she has been having vertigo for several weeks prior to her fall  ROS As above  Objective:   VITALS:   Vitals:   02/16/23 1630 02/16/23 2046 02/17/23 0516 02/17/23 0851  BP: (!) 148/79 (!) 119/58 (!) 123/53 113/69  Pulse: 95 94 89 94  Resp: 16 18 16 18   Temp: 98.7 F (37.1 C) 98.3 F (36.8 C)  98 F (36.7 C)  TempSrc: Oral Oral    SpO2: (!) 85% 98% 96% 95%  Weight:      Height:        Estimated body mass index is 23.66 kg/m as calculated from the following:   Height as of this encounter: 5\' 6"  (1.676 m).   Weight as of this encounter: 66.5 kg.   Intake/Output      08/24 0701 08/25 0700 08/25 0701 08/26 0700   P.O. 720    I.V. (mL/kg)     IV Piggyback     Total Intake(mL/kg) 720 (10.8)    Blood     Total Output     Net +720         Urine Occurrence 9 x      LABS  Results for orders placed or performed during the hospital encounter of 02/15/23 (from the past 24 hour(s))  Glucose, capillary     Status: Abnormal   Collection Time: 02/16/23 11:36 AM  Result Value Ref Range   Glucose-Capillary 110 (H) 70 - 99 mg/dL  Glucose, capillary     Status: Abnormal   Collection Time: 02/16/23  4:33 PM  Result Value Ref Range   Glucose-Capillary 148 (H) 70 - 99 mg/dL  Glucose, capillary     Status: Abnormal   Collection Time: 02/16/23  9:23 PM  Result Value Ref Range   Glucose-Capillary 112 (H) 70 - 99 mg/dL  CBC     Status: Abnormal   Collection Time: 02/17/23  4:53 AM  Result Value Ref Range   WBC 6.5 4.0 - 10.5 K/uL   RBC 2.67 (L) 3.87 - 5.11 MIL/uL   Hemoglobin 7.4 (L) 12.0 - 15.0 g/dL   HCT 40.9 (L) 81.1 - 91.4  %   MCV 85.4 80.0 - 100.0 fL   MCH 27.7 26.0 - 34.0 pg   MCHC 32.5 30.0 - 36.0 g/dL   RDW 78.2 95.6 - 21.3 %   Platelets 232 150 - 400 K/uL   nRBC 0.0 0.0 - 0.2 %  VITAMIN D 25 Hydroxy (Vit-D Deficiency, Fractures)     Status: None   Collection Time: 02/17/23  4:53 AM  Result Value Ref Range   Vit D, 25-Hydroxy 49.72 30 - 100 ng/mL  Glucose, capillary     Status: None   Collection Time: 02/17/23  8:52 AM  Result Value Ref Range   Glucose-Capillary 77 70 - 99 mg/dL     PHYSICAL EXAM:   Gen: sitting up in bed, looks good, NAD  Lungs: unlabored Cardiac: reg Ext:       Right Lower Extremity Dressing R thigh is clean, dry and  intact             Extremity is warm             No DCT             Compartments are soft             DPN, SPN, TN sensory functions are intact             EHL, FHL, lesser toe motor functions intact             Ankle flexion, extension, inversion eversion intact             + DP pulse    Assessment/Plan: 2 Days Post-Op   Principal Problem:   Hip fracture (HCC) Active Problems:   CAD (coronary artery disease)   DM (diabetes mellitus), type 2 (HCC)   HLD (hyperlipidemia)   Asthma   HTN (hypertension)   UTI (urinary tract infection)   Anti-infectives (From admission, onward)    Start     Dose/Rate Route Frequency Ordered Stop   02/16/23 1530  cephALEXin (KEFLEX) capsule 500 mg        500 mg Oral Every 8 hours 02/16/23 1439 02/18/23 1359   02/15/23 2000  ceFAZolin (ANCEF) IVPB 2g/100 mL premix        2 g 200 mL/hr over 30 Minutes Intravenous Every 8 hours 02/15/23 1526 02/16/23 1205   02/15/23 1100  sulfamethoxazole-trimethoprim (BACTRIM DS) 800-160 MG per tablet 1 tablet  Status:  Discontinued        1 tablet Oral Every 12 hours 02/15/23 1001 02/16/23 1433   02/15/23 1100  ceFAZolin (ANCEF) IVPB 2g/100 mL premix        2 g 200 mL/hr over 30 Minutes Intravenous On call to O.R. 02/15/23 1009 02/15/23 1210     .  POD/HD#: 2  67 y/o  female s/p fall with R intertrochanteric femur fracture s/p intramedullary nailing   Weightbearing: WBAT RLE ROM: unrestricted ROM R hip and knee Insicional and dressing care: Daily dressing changes with 4 x 4 gauze and tape or a silicone foam dressing starting on 02/17/2023 Pain management: Multimodal, minimize narcotics ID: Perioperative antibiotics Impediments to Fracture Healing: Diabetes, poor bone quality Bone Health/Optimization: vitamin d levels look good     Recommend DEXA in 4-6 weeks   Hemodynamics:  h/h trending down.  Monitor. Pt is a TEFL teacher [employer]    Orthopedic device(s):  Walker   Showering: Okay to shower once incisions are dry and there is no drainage.  Clean with soap and water only   VTE prophylaxis: Plavix and Eliquis   Dispo: Therapy evaluations   Follow - up plan: 2 weeks with ortho for post op check     Mearl Latin, PA-C [phone] (C) 02/17/2023, 11:08 AM  [facility] [address] [place] [address] [phone] (F)    After 5pm and on the weekends please log on to Amion, go to orthopaedics and the look under the [facility] Call for the provider(s) on call. You can also call our office at [phone] and then follow the prompts to be connected to the call team.  Patient ID: [name], female   DOB: 10-Sep-1955, 67 y.o.   MRN: [id number]

## 2023-02-17 NOTE — Progress Notes (Signed)
Physical Therapy Treatment Patient Details Name: [name] MRN: [id number] DOB: 12/13/1955 Today's Date: 02/17/2023   History of Present Illness Pt is 67 yo who presented to [facility] following ground level fall and sustained a R intertrochanteric femur fracture. Pt is currently s/p IM nailing on 02/15/23. 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    PT Comments  Pt is progressing towards goals and was able to increase gait distance this session. Currently pt requires CG-Min A for bed mobility, CGA for sit to stand and Min a for short in home distances of gait with RW. 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 Due to pt current functional status, home set up and available assistance at home recommending skilled physical therapy services 3x/weekly with vestibular/orthopedic therapist on discharge from acute care hospital setting once medically stable.     If plan is discharge home, recommend the following: Assistance with cooking/housework;Help with stairs or ramp for entrance     Equipment Recommendations  Rolling walker (2 wheels);BSC/3in1;Other (comment) (gait belt)       Precautions / Restrictions Precautions Precautions: Fall Restrictions Weight Bearing Restrictions: Yes RLE Weight Bearing: Weight bearing as tolerated     Mobility  Bed Mobility Overal bed mobility: Needs Assistance Bed Mobility: Supine to Sit, Sit to Supine     Supine to sit: Contact guard Sit to supine: Min assist   General bed mobility comments: CGA with education on use of gait belt for RLE progression to EOB. Min A with the RLE to get sitting to supine without belt. Pt has significant difficutly remembering education.    Transfers Overall transfer level: Needs  assistance Equipment used: Rolling walker (2 wheels) Transfers: Sit to/from Stand Sit to Stand: Contact guard assist           General transfer comment: multi modal cueing for safe hand placement with sit to stand for 2x attempt. Pt has difficulty remembering safety education.    Ambulation/Gait Ambulation/Gait assistance: Min assist, Contact guard assist Gait Distance (Feet): 30 Feet Assistive device: Rolling walker (2 wheels) Gait Pattern/deviations: Step-through pattern, Decreased step length - right, Decreased step length - left, Decreased stance time - right Gait velocity: Decreased cadence. Gait velocity interpretation: <1.31 ft/sec, indicative of household ambulator   General Gait Details: Pt has very short steps and requires CGA to Min A with gait requiring assistance navigating AD and constant verbal cues to slow down movement of RW to prevent LOB       Balance Overall balance assessment: Needs assistance Sitting-balance support: No upper extremity supported, Feet supported Sitting balance-[name] Scale: Fair Sitting balance - Comments: No overt LOB sitting EOB   Standing balance support: Single extremity supported, Bilateral upper extremity supported Standing balance-[name] Scale: Poor Standing balance comment: Pt has 2x posterior LOB with RW due to poor safety awareness with RW.          Cognition Arousal: Alert Behavior During Therapy: WFL for tasks assessed/performed       General Comments: Pt seems to have some difficulty cognitively with following directions and safety. Difficult to tell how much of a cognitive impairment she has from baseline.           General Comments General comments (skin integrity, edema, etc.): Vestibular assessment today. Negative Dix-Hallpike bil, Negative horizontal canal and anterior canal. pt  is positive for VOR and movement most likely hypofunction of the L ear. pt has slight increase in symptoms with convergence. Spoke with  MD concerning referral to ENT following discharge and vestibular physical therapy either with HHPT or after pt finishes HHPT for ortho issues. Also spoke with MD about possible pulmonary referral due to pt breathing difficulties which she states she has not seen an MD. Pt was agreeable to all suggestions. Family and pt were education on vestibular system and hypofunction vs BPPV vs. central within PT scope of practice.      Pertinent Vitals/Pain Pain Assessment Pain Assessment: Faces Faces Pain Scale: Hurts little more Pain Location: R hip Pain Descriptors / Indicators: Grimacing, Guarding Pain Intervention(s): Monitored during session     PT Goals (current goals can now be found in the care plan section) Acute Rehab PT Goals Patient Stated Goal: Return home with family PT Goal Formulation: With patient Time For Goal Achievement: 03/02/23 Potential to Achieve Goals: Good Progress towards PT goals: Progressing toward goals    Frequency    Min 1X/week      PT Plan  Continue with current POC       AM-PAC PT "6 Clicks" Mobility   Outcome Measure  Help needed turning from your back to your side while in a flat bed without using bedrails?: A Little Help needed moving from lying on your back to sitting on the side of a flat bed without using bedrails?: A Little Help needed moving to and from a bed to a chair (including a wheelchair)?: A Little Help needed standing up from a chair using your arms (e.g., wheelchair or bedside chair)?: A Little Help needed to walk in hospital room?: A Little Help needed climbing 3-5 steps with a railing? : A Little 6 Click Score: 18    End of Session Equipment Utilized During Treatment: Gait belt Activity Tolerance: Patient tolerated treatment well Patient left: in bed;with family/visitor present;with call bell/phone within reach Nurse Communication: Mobility status PT Visit Diagnosis: Other abnormalities of gait and mobility (R26.89)      Time: 1610-9604 PT Time Calculation (min) (ACUTE ONLY): 33 min  Charges:    $Therapeutic Activity: 23-37 mins PT General Charges $$ ACUTE PT VISIT: 1 Visit                     Harrel Carina, DPT, CLT  Acute Rehabilitation Services Office: [phone] (Secure chat preferred)    Claudia Desanctis 02/17/2023, 3:32 PM

## 2023-02-18 DIAGNOSIS — J45909 Unspecified asthma, uncomplicated: Secondary | ICD-10-CM | POA: Diagnosis not present

## 2023-02-18 DIAGNOSIS — S72001A Fracture of unspecified part of neck of right femur, initial encounter for closed fracture: Secondary | ICD-10-CM | POA: Diagnosis not present

## 2023-02-18 DIAGNOSIS — I2510 Atherosclerotic heart disease of native coronary artery without angina pectoris: Secondary | ICD-10-CM | POA: Diagnosis not present

## 2023-02-18 DIAGNOSIS — N3000 Acute cystitis without hematuria: Secondary | ICD-10-CM | POA: Diagnosis not present

## 2023-02-18 LAB — GLUCOSE, CAPILLARY
Glucose-Capillary: 81 mg/dL (ref 70–99)
Glucose-Capillary: 162 mg/dL — ABNORMAL HIGH (ref 70–99)
Glucose-Capillary: 161 mg/dL — ABNORMAL HIGH (ref 70–99)

## 2023-02-18 MED ORDER — OXYCODONE HCL 5 MG PO TABS: 2.5000 mg | ORAL_TABLET | Freq: Four times a day (QID) | ORAL | Status: DC | PRN

## 2023-02-18 MED ORDER — OXYCODONE HCL 5 MG PO TABS: 2.5000 mg | ORAL_TABLET | Freq: Four times a day (QID) | ORAL | 0 refills | Status: AC | PRN

## 2023-02-18 MED ORDER — OXYCODONE HCL 5 MG PO TABS: 5.0000 mg | ORAL_TABLET | ORAL | Status: DC | PRN

## 2023-02-18 NOTE — TOC Progression Note (Signed)
Transition of Care [facility]) - Progression Note    Patient Details  Name: [name] MRN: [id number] Date of Birth: August 10, 1955  Transition of Care [facility]) CM/SW Contact  Huston Foley Jacklynn Ganong, RN Phone Number: 02/18/2023, 10:01 AM  Clinical Narrative:    Patient is 67 yr old female s/p IM nailing of right femur after a fall. Case Manager spoke with patient concerning discharge needs. Discussed Home Health agencies  patient has no preference and gave CM permission to locate Agency that accepts her insurance coverage. Referral called to Morrie Sheldon, Liaison with [facility] DME will be delivered to patient's room from [facility]. Patient states she and her husband live with daughter and son-in-law, and they will assist her at discharge.     Expected Discharge Plan: Home w Home Health Services Barriers to Discharge: No Barriers Identified  Expected Discharge Plan and Services   Discharge Planning Services: CM Consult Post Acute Care Choice: Durable Medical Equipment, Home Health Living arrangements for the past 2 months: Single Family Home                 DME Arranged: 3-N-1 DME Agency: [employer] Date DME Agency Contacted: 02/18/23 Time DME Agency Contacted: [phone] Representative spoke with at DME Agency: Shaune Leeks HH Arranged: RN HH Agency: [facility] Date HH Agency Contacted: 02/18/23 Time HH Agency Contacted: 0945 Representative spoke with at [facility] Agency: Morrie Sheldon   Social Determinants of Health (SDOH) Interventions SDOH Screenings   Food Insecurity: No Food Insecurity (02/15/2023)  Housing: Low Risk  (02/15/2023)  Transportation Needs: No Transportation Needs (02/15/2023)  Utilities: Not At Risk (02/15/2023)  Tobacco Use: Medium Risk (02/15/2023)    Readmission Risk Interventions     No data to display

## 2023-02-18 NOTE — Progress Notes (Signed)
Physical Therapy Treatment Patient Details Name: [name] MRN: [id number] DOB: 1956/02/06 Today's Date: 02/18/2023   History of Present Illness Pt is 67 yo who [facility] to [facility] following ground level fall and sustained a R intertrochanteric femur fracture. Pt is currently s/p IM nailing on 02/15/23. 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    PT Comments  Continuing work on functional mobility and activity tolerance;  Session focused on trasnfers, gait and stair training in prep for dc home; Overall she manages fine with short distance amb, and showing improvements with bed mobility, too; Stair training done, and we discussed car transfers as well as Hip HEP;   Pt described dizziness again with incr time in standing and upright activity; dizziness likely multifactorial (noted [name], PT's vestibular assessment from yesterday); I also believe there is a big postural hypotension piece to this dizziness puzzle;     02/18/23 1300 02/18/23 1310  Orthostatic Sitting  BP- Sitting (!) 162/93 (MAP 114)  --   Pulse- Sitting 101  --   Orthostatic Standing at 0 minutes  BP- Standing at 0 minutes 103/67 (MAP 76)  --   Pulse- Standing at 0 minutes 112  --   Orthostatic Standing at 3 minutes  BP- Standing at 3 minutes 115/71 (MAP 86) 106/72 (MAP 83) Standing at 10 minutes  Pulse- Standing at 3 minutes 118 121   Showing an SBP drop of 59 mmHg from sitting to standing, and while BP rallied at 3 minutes, it still remained quite low relative to sitting; HR also incr about 20 bpm with just the act of standing; Discussed getting follow up for her BP with her PCP; Dr. Natale Milch notified as well   If plan is discharge home, recommend the following: Assistance with cooking/housework;Help with stairs or ramp for entrance   Can travel by private vehicle        Equipment Recommendations   Rolling walker (2 wheels);BSC/3in1;Other (comment) (gait belt)    Recommendations for Other Services  Close follow up for BP drop in standing with PCP     Precautions / Restrictions Precautions Precautions: Fall Precaution Comments: Dizziness with incr time in standing/upright activity Restrictions RLE Weight Bearing: Weight bearing as tolerated     Mobility  Bed Mobility Overal bed mobility: Needs Assistance Bed Mobility: Supine to Sit, Sit to Supine     Supine to sit: Contact guard Sit to supine: Contact guard assist   General bed mobility comments: Used gait belt well to help her RLE off of teh bed; incr time to get back in bed, but no physical assist needed    Transfers Overall transfer level: Needs assistance Equipment used: Rolling walker (2 wheels) Transfers: Sit to/from Stand Sit to Stand: Supervision           General transfer comment: cues for hand placement    Ambulation/Gait Ambulation/Gait assistance: Contact guard assist Gait Distance (Feet): 25 Feet (in adn around room) Assistive device: Rolling walker (2 wheels) Gait Pattern/deviations: Step-through pattern, Decreased step length - right, Decreased step length - left, Decreased stance time - right       General Gait Details: Pt has very short steps and requires CGA with gait requiring cues for navigating AD and constant verbal cues to slow down movement of RW to prevent LOB   Stairs Stairs: Yes Stairs assistance: Supervision Stair Management: No rails, With walker, Backwards, Forwards, Step to pattern Number of  Stairs: 1 (x 2 reps) General stair comments: verbal and demo cues for sequence; pt managed getting up and down the one step well, and we discussed using going up with the backward technqiue to get into a car   Wheelchair Mobility     Tilt Bed    Modified Rankin (Stroke Patients Only)       Balance     Sitting balance-[name] Scale: Good       Standing balance-[name] Scale:  Poor                              Cognition Arousal: Alert Behavior During Therapy: WFL for tasks assessed/performed Overall Cognitive Status: Within Functional Limits for tasks assessed                                          Exercises Other Exercises Other Exercises: Went over hip therex HEP with written handout    General Comments        Pertinent Vitals/Pain Pain Assessment Pain Assessment: No/denies pain Pain Intervention(s): Monitored during session    Home Living                          Prior Function            PT Goals (current goals can now be found in the care plan section) Acute Rehab PT Goals Patient Stated Goal: Return home with family PT Goal Formulation: With patient Time For Goal Achievement: 03/02/23 Potential to Achieve Goals: Good Progress towards PT goals: Progressing toward goals    Frequency    Min 1X/week      PT Plan      Co-evaluation              AM-PAC PT "6 Clicks" Mobility   Outcome Measure  Help needed turning from your back to your side while in a flat bed without using bedrails?: A Little Help needed moving from lying on your back to sitting on the side of a flat bed without using bedrails?: A Little Help needed moving to and from a bed to a chair (including a wheelchair)?: A Little Help needed standing up from a chair using your arms (e.g., wheelchair or bedside chair)?: A Little Help needed to walk in hospital room?: A Little Help needed climbing 3-5 steps with a railing? : A Little 6 Click Score: 18    End of Session Equipment Utilized During Treatment: Gait belt Activity Tolerance: Patient tolerated treatment well Patient left: in bed;with call bell/phone within reach Nurse Communication: Mobility status;Other (comment) (serial BPs) PT Visit Diagnosis: Other abnormalities of gait and mobility (R26.89)     Time: 1610-9604 PT Time Calculation (min) (ACUTE ONLY): 48  min  Charges:    $Gait Training: 8-22 mins $Therapeutic Activity: 23-37 mins PT General Charges $$ ACUTE PT VISIT: 1 Visit                     Van Clines, PT  Acute Rehabilitation Services Office [phone] Secure Chat welcomed    Levi Aland 02/18/2023, 2:23 PM

## 2023-02-18 NOTE — Progress Notes (Signed)
Orthopaedic Trauma Progress Note  SUBJECTIVE: Doing ok today.  Pain controlled.  Has not required any narcotics over the last 24 hours.  No chest pain. No SOB. No nausea/vomiting. No other complaints. Noted some dry heaving the first time she got out of bed with therapies but has not had any issues with this since. Overall she feels she has been mobilizing well. Plans to return home with her husband at discharge. Has one step to enter her home.   OBJECTIVE:  Vitals:   02/18/23 0433 02/18/23 0802  BP: 127/64 99/75  Pulse: 87 89  Resp: 18 18  Temp: 98.1 F (36.7 C) 99.3 F (37.4 C)  SpO2: 99% 95%    General: Sitting up in bed, NAD Respiratory: No increased work of breathing.  Right lower extremity: Dressings removed, incisions are clean, dry, intact.  No significant tenderness with palpation of the hip or throughout the thigh.  Tolerates gentle knee and ankle range of motion. + EHL/FHL.  Compartments soft compressible.  No significant calf tenderness. + DP pulse  IMAGING: Stable post op imaging.   LABS:  Results for orders placed or performed during the hospital encounter of 02/15/23 (from the past 24 hour(s))  Glucose, capillary     Status: None   Collection Time: 02/17/23  8:52 AM  Result Value Ref Range   Glucose-Capillary 77 70 - 99 mg/dL  Glucose, capillary     Status: None   Collection Time: 02/17/23 11:31 AM  Result Value Ref Range   Glucose-Capillary 73 70 - 99 mg/dL  Glucose, capillary     Status: Abnormal   Collection Time: 02/17/23  4:11 PM  Result Value Ref Range   Glucose-Capillary 106 (H) 70 - 99 mg/dL  Glucose, capillary     Status: Abnormal   Collection Time: 02/17/23  7:52 PM  Result Value Ref Range   Glucose-Capillary 131 (H) 70 - 99 mg/dL  Glucose, capillary     Status: None   Collection Time: 02/18/23  8:01 AM  Result Value Ref Range   Glucose-Capillary 81 70 - 99 mg/dL    ASSESSMENT: [name] is a 67 y.o. female, 3 Days Post-Op  s/p INTRAMEDULLARY NAILING RIGHT HIP  CV/Blood loss: Acute blood loss anemia, Hgb 7.4 on 02/17/2023.  Patient does not wish to proceed with any blood transfusions. Hemodynamically stable  PLAN: Weightbearing: WBAT RLE ROM: Okay for unrestricted ROM Incisional and dressing care: Okay to leave incisions open to air Showering: Okay to begin showering and getting incisions wet Orthopedic device(s): None  Pain management: Continue multimodal pain control.  Limit narcotics as able given bouts of hypotension  VTE prophylaxis: Aspirin and Plavix , SCDs ID:  Ancef 2gm post op Foley/Lines:  No foley, KVO IVFs Impediments to Fracture Healing: Diabetes, poor bone quality Bone Health/Optimization: vitamin D levels look good. Recommend DEXA in 4-6 weeks   Dispo: PT/OT evaluations ongoing. Recommending HH. Okay for discharge from ortho standpoint once cleared by medicine team and therapies  D/C recommendations: -Tylenol for pain control.  Does not appear patient will need any narcotics at this point -Continue dual antiplatelet therapy with aspirin and Plavix for DVT prophylaxis -No additional need for Vit D supplementation  Follow - up plan: 2 weeks after discharge for wound check and repeat x-rays   Contact information:  Truitt Merle MD, Thyra Breed PA-C. After hours and holidays please check [email or web address] for group call information for [facility]   Thompson Caul, PA-C [phone] (office)  [email or web address]

## 2023-02-18 NOTE — Care Management Important Message (Signed)
Important Message  Patient Details  Name: MOYA ZHU MRN: [id number] Date of Birth: [birth year]   Medicare Important Message Given:  Yes     Sherilyn Banker 02/18/2023, 12:50 PM

## 2023-02-18 NOTE — Discharge Summary (Signed)
Physician Discharge Summary  [name]:[id number] DOB: 1956-01-22 DOA: 02/15/2023  PCP: Sherry Mina, MD  Admit date: 02/15/2023 Discharge date: 02/18/2023  Admitted From: Home Disposition:  Home w/ HHPT  Recommendations for Outpatient Follow-up:  Follow up with PCP in 1-2 weeks Follow up with orthopedic surgery as scheduled  Home Health: PT/OT  Equipment/Devices: 3in1  Discharge Condition: Stable  CODE STATUS:Full  Diet recommendation:  As tolerated - regular diet  Brief/Interim Summary: [name], a 67 y/o woman with DM, HLD, HTN had a mechanical fall at home striking he head and right hip. She was seen at outside ED where she had nl CT head, CT c-spine. Hip xray revealed comminuted intratrochanteric fx right. She was transferred to [facility] for ortho consult and repair.  Hospitalist called for admission.  Patient tolerated procedure with orthopedic surgery, IM nail 02/15/2023.  Patient ambulating now with minimal pain or assistance.  Of note patient continues to have episodes of vertigo/dizziness that appear to be unrelated to any specific cause.  Patient does have orthostatic vital signs but was asymptomatic upon standing, subsequently became symptomatic while walking with stable blood pressure.  Patient does have component of positional vertigo but also is not consistent or reproducible at all times.  Regardless given patient stable nature, tolerating procedure well she is otherwise stable and agreeable for discharge home.  Patient's hemoglobin, while low at baseline, down trended somewhat after surgery not unexpectedly.  Lab draws were discontinued however as patient is Jehovah's Witness and is unable to accept blood transfusion and attempts to limit phlebotomy.  Would recommend follow-up with PCP in the next 1 to 2 weeks for repeat labs to ensure stable hemoglobin.  Postop care and follow-up with orthopedics as scheduled.  Discharge Diagnoses:  Principal Problem:   Hip  fracture (HCC) Active Problems:   UTI (urinary tract infection)   CAD (coronary artery disease)   DM (diabetes mellitus), type 2 (HCC)   HLD (hyperlipidemia)   Asthma   HTN (hypertension)  Hip fracture (HCC) -Patient with traumatic intratrochanteric fx after mechanical fall/dizziness..  -Status post IM nail 02/15/2023 -Pain currently well-controlled, following up with physical therapy at home. -Continue low-dose oxycodone, wean as tolerated.   Anemia of chronic disease, chronic Acute blood loss anemia, stable -Hemoglobin downtrending from already low baseline, likely partially hemodilutional versus secondary to phlebotomy and procedure -She is not agreeable to transfusion given she is an active Jehovah's witness   UTI (urinary tract infection), POA, resolved Presumed positive given abnormal UA and symptoms, completed cephalexin x3 days(Bactrim discontinued) based on sensitivities   HTN (hypertension), improving -BP elevated at admission likely secondary to pain, subsequently hypotensive after narcotic administration anesthesia.  Currently appears to be back to baseline. -Continue home regimen   Asthma, not in acute exacerbation Continue inhalers, nebs scheduled and as needed   HLD (hyperlipidemia) Continue statin, lipid panel unremarkable   DM (diabetes mellitus), type 2 (HCC) Patient takes [place] somewhat intermittently per home regimen A1c 6.8 Tresiba daily with sliding scale and hypoglycemic protocol on board   CAD (coronary artery disease) Stable, continue statin, aspirin, losartan  Discharge Instructions  Discharge Instructions     Face-to-face encounter (required for Medicare/Medicaid patients)   Complete by: As directed    I [name] certify that this patient is under my care and that I, or a nurse practitioner or physician's assistant working with me, had a face-to-face encounter that meets the physician face-to-face encounter requirements with this  patient on 02/18/2023. The  encounter with the patient was in whole, or in part for the following medical condition(s) which is the primary reason for home health care (List medical condition): Ambulatory dysfunction, vertigo, femur fracture   The encounter with the patient was in whole, or in part, for the following medical condition, which is the primary reason for home health care: ambulatory dysfunction, vertigo, femur fracture   I certify that, based on my findings, the following services are medically necessary home health services: Physical therapy   Reason for Medically Necessary Home Health Services:  Skilled Nursing- Change/Decline in Patient Status Therapy- Investment banker, operational, Patent examiner Therapy- Home Adaptation to Facilitate Safety Therapy- Instruction on Safe use of Assistive Devices for ADLs     My clinical findings support the need for the above services:  Unable to leave home safely without assistance and/or assistive device Unsafe ambulation due to balance issues     Further, I certify that my clinical findings support that this patient is homebound due to:  Unable to leave home safely without assistance Pain interferes with ambulation/mobility     Home Health   Complete by: As directed    To provide the following care/treatments:  PT OT        Allergies as of 02/18/2023       Reactions   Other Shortness Of Breath   No blood or blood products   Penicillins Itching, Rash   Rash occurred ass a teenager -- took for strep throat.   Toradol [ketorolac Tromethamine] Other (See Comments)   Panic attack    Beef-derived Products    Chicken Protein    Fish-derived Products    Pork-derived Products    Imdur [isosorbide Nitrate] Other (See Comments)   Headaches    Latex Rash   Rash on all points of contact (thick, heavy itching rash)   Metformin And Related Diarrhea, Nausea And Vomiting        Medication List     TAKE these medications    ALPHA  LIPOIC ACID PO Take 1 capsule by mouth daily.   aspirin EC 81 MG tablet Take 81 mg by mouth daily.   atorvastatin 40 MG tablet Commonly known as: LIPITOR Take 1 tablet (40 mg total) by mouth daily at 6 PM. What changed:  how much to take when to take this   clopidogrel 75 MG tablet Commonly known as: PLAVIX Take 75 mg by mouth daily.   D3 + K2 PO Take 1 tablet by mouth daily.   FENUGREEK PO Take 2 tablets by mouth daily as needed (muscle stiffness).   losartan 100 MG tablet Commonly known as: COZAAR Take 100 mg by mouth daily.   oxyCODONE 5 MG immediate release tablet Commonly known as: Oxy IR/ROXICODONE Take 0.5 tablets (2.5 mg total) by mouth every 6 (six) hours as needed for up to 3 days for severe pain or breakthrough pain.   Evaristo Bury FlexTouch 200 UNIT/ML FlexTouch Pen Generic drug: insulin degludec Inject 20 Units into the skin at bedtime.        Follow-up Information     Haddix, Gillie Manners, MD. Schedule an appointment as soon as possible for a visit in 2 week(s).   Specialty: Orthopedic Surgery Why: for wound check and repeat x-rays Contact information: [address] [place] [address] [phone]                Allergies  Allergen Reactions   Other Shortness Of Breath    No  blood or blood products   Penicillins Itching and Rash    Rash occurred ass a teenager -- took for strep throat.   Toradol [Ketorolac Tromethamine] Other (See Comments)    Panic attack    Beef-Derived Products    Chicken Protein    Fish-Derived Products    Pork-Derived Products    Imdur [Isosorbide Nitrate] Other (See Comments)    Headaches    Latex Rash    Rash on all points of contact (thick, heavy itching rash)   Metformin And Related Diarrhea and Nausea And Vomiting    Consultations: Orthopedic surgery  Procedures/Studies: DG HIP PORT UNILAT W OR W/O PELVIS 1V RIGHT  Result Date: 02/15/2023 CLINICAL DATA:  Fracture.  Postoperative. EXAM: DG HIP (WITH  OR WITHOUT PELVIS) 1V PORT RIGHT COMPARISON:  Pelvis and right hip radiographs 02/15/2023 FINDINGS: Interval cephalomedullary nail fixation of the previously seen acute, comminuted proximal right femoral intertrochanteric fracture. Expected postoperative lateral pelvis and hip subcutaneous air. Mild bilateral superior femoroacetabular joint space narrowing. Mild bilateral sacroiliac subchondral sclerosis. The pubic symphysis joint space is maintained. Moderate atherosclerotic calcifications. Numerous vascular phleboliths overlie the pelvis. IMPRESSION: Interval cephalomedullary nail fixation of the previously seen acute, comminuted proximal right femoral intertrochanteric fracture. No evidence of hardware failure. Electronically Signed   By: Neita Garnet M.D.   On: 02/15/2023 13:45   DG HIP UNILAT WITH PELVIS 2-3 VIEWS RIGHT  Result Date: 02/15/2023 CLINICAL DATA:  Fracture, left hip surgery. EXAM: DG HIP (WITH OR WITHOUT PELVIS) 2-3V RIGHT COMPARISON:  Preoperative imaging FINDINGS: Five fluoroscopic spot views of the right hip obtained in the operating room. Femoral intramedullary nail with trans trochanteric and distal locking screw fixation traverse intertrochanteric femur fracture. Fluoroscopy time 53.7 seconds. Dose 5.49 mGy. IMPRESSION: Intraoperative fluoroscopy during ORIF of right intertrochanteric femur fracture. Electronically Signed   By: Narda Rutherford M.D.   On: 02/15/2023 13:05   DG C-Arm 1-60 Min-No Report  Result Date: 02/15/2023 Fluoroscopy was utilized by the requesting physician.  No radiographic interpretation.   DG Knee Right Port  Result Date: 02/15/2023 CLINICAL DATA:  [id number]. Right hip fracture. Check for right knee fracture. EXAM: PORTABLE RIGHT KNEE - 1-2 VIEW COMPARISON:  None Available. FINDINGS: There is osteopenia without evidence of fractures. No suprapatellar bursal effusion is seen. Patchy calcific plaques in the distal femoral, popliteal and popliteal trifurcation  arteries with otherwise unremarkable soft tissues. There is mild narrowing of the medial femorotibial joint with trace marginal spurring. Other joint spaces are maintained in the knee. There is no erosive arthropathy. Interosseous alignment is normal. IMPRESSION: 1. Osteopenia and mild degenerative change without evidence of fractures. 2. Peripheral vascular disease. Electronically Signed   By: Almira Bar M.D.   On: 02/15/2023 07:42   DG Hip Unilat W or Wo Pelvis 2-3 Views Right  Result Date: 02/15/2023 CLINICAL DATA:  Fall fall, right hip pain EXAM: DG HIP (WITH OR WITHOUT PELVIS) 2-3V RIGHT COMPARISON:  None Available. FINDINGS: Acute, comminuted intratrochanteric fracture of the right hip noted with avulsion of the lesser trochanter and mild varus angulation of the distal fracture fragment. Femoral head is still seated within the right acetabulum. Pelvis and visualized left hip are intact. Vascular calcifications are noted. IMPRESSION: 1. Acute, comminuted intratrochanteric fracture of the right hip. Electronically Signed   By: Helyn Numbers M.D.   On: 02/15/2023 02:45   DG Chest Port 1 View  Result Date: 02/15/2023 CLINICAL DATA:  Fall, chest trauma EXAM: PORTABLE CHEST 1 VIEW COMPARISON:  03/18/2019 FINDINGS: Lungs are well expanded, symmetric, and clear. No pneumothorax or pleural effusion. Cardiac size within normal limits. Calcification of the mitral valve annulus noted. Pulmonary vascularity is normal. Osseous structures are age-appropriate. No acute bone abnormality. IMPRESSION: No active disease. Electronically Signed   By: Helyn Numbers M.D.   On: 02/15/2023 02:45   CT Head Wo Contrast  Result Date: 02/15/2023 CLINICAL DATA:  Fall, on blood thinners, head and neck trauma EXAM: CT HEAD WITHOUT CONTRAST CT CERVICAL SPINE WITHOUT CONTRAST TECHNIQUE: Multidetector CT imaging of the head and cervical spine was performed following the standard protocol without intravenous contrast.  Multiplanar CT image reconstructions of the cervical spine were also generated. RADIATION DOSE REDUCTION: This exam was performed according to the departmental dose-optimization program which includes automated exposure control, adjustment of the mA and/or kV according to patient size and/or use of iterative reconstruction technique. COMPARISON:  03/17/2019 CT head, no prior CT cervical spine FINDINGS: CT HEAD FINDINGS Brain: No evidence of acute infarct, hemorrhage, mass, mass effect, or midline shift. No hydrocephalus or extra-axial fluid collection. Age-related cerebral atrophy. Encephalomalacia in the right occipital lobe, unchanged. Remote right thalamic lacunar infarct. Vascular: No hyperdense vessel. Atherosclerotic calcifications in the intracranial carotid and vertebral arteries. Skull: Negative for fracture or focal lesion. Right frontal scalp hematoma. Sinuses/Orbits: Mucosal thickening in the ethmoid air cells. Status post bilateral lens replacements. Other: The mastoid air cells are well aerated. CT CERVICAL SPINE FINDINGS Alignment: No traumatic listhesis. Skull base and vertebrae: No acute fracture or suspicious osseous lesion. Soft tissues and spinal canal: No prevertebral fluid or swelling. No visible canal hematoma. Disc levels: Degenerative changes in the cervical spine. Mild spinal canal stenosis at C5-C6 and C6-C7. Upper chest: No focal pulmonary opacity or pleural effusion. IMPRESSION: 1. No acute intracranial process. Right frontal scalp hematoma. 2. No acute fracture or traumatic listhesis in the cervical spine. Electronically Signed   By: Wiliam Ke M.D.   On: 02/15/2023 02:42   CT Cervical Spine Wo Contrast  Result Date: 02/15/2023 CLINICAL DATA:  Fall, on blood thinners, head and neck trauma EXAM: CT HEAD WITHOUT CONTRAST CT CERVICAL SPINE WITHOUT CONTRAST TECHNIQUE: Multidetector CT imaging of the head and cervical spine was performed following the standard protocol without  intravenous contrast. Multiplanar CT image reconstructions of the cervical spine were also generated. RADIATION DOSE REDUCTION: This exam was performed according to the departmental dose-optimization program which includes automated exposure control, adjustment of the mA and/or kV according to patient size and/or use of iterative reconstruction technique. COMPARISON:  03/17/2019 CT head, no prior CT cervical spine FINDINGS: CT HEAD FINDINGS Brain: No evidence of acute infarct, hemorrhage, mass, mass effect, or midline shift. No hydrocephalus or extra-axial fluid collection. Age-related cerebral atrophy. Encephalomalacia in the right occipital lobe, unchanged. Remote right thalamic lacunar infarct. Vascular: No hyperdense vessel. Atherosclerotic calcifications in the intracranial carotid and vertebral arteries. Skull: Negative for fracture or focal lesion. Right frontal scalp hematoma. Sinuses/Orbits: Mucosal thickening in the ethmoid air cells. Status post bilateral lens replacements. Other: The mastoid air cells are well aerated. CT CERVICAL SPINE FINDINGS Alignment: No traumatic listhesis. Skull base and vertebrae: No acute fracture or suspicious osseous lesion. Soft tissues and spinal canal: No prevertebral fluid or swelling. No visible canal hematoma. Disc levels: Degenerative changes in the cervical spine. Mild spinal canal stenosis at C5-C6 and C6-C7. Upper chest: No focal pulmonary opacity or pleural effusion. IMPRESSION: 1. No acute intracranial process. Right frontal scalp hematoma. 2. No acute fracture  or traumatic listhesis in the cervical spine. Electronically Signed   By: Wiliam Ke M.D.   On: 02/15/2023 02:42     Subjective: No acute issues or events overnight denies nausea vomiting diarrhea constipation headache fevers chills or chest pain.  Patient does continue to complain of vertiginous symptoms on occasion somewhat sporadically at rest as well as during activity.   Discharge  Exam: Vitals:   02/18/23 0433 02/18/23 0802  BP: 127/64 99/75  Pulse: 87 89  Resp: 18 18  Temp: 98.1 F (36.7 C) 99.3 F (37.4 C)  SpO2: 99% 95%   Vitals:   02/17/23 1949 02/18/23 0430 02/18/23 0433 02/18/23 0802  BP: (!) 106/59 (!) 94/58 127/64 99/75  Pulse: 100 82 87 89  Resp: 19 19 18 18   Temp: 98.1 F (36.7 C) 98 F (36.7 C) 98.1 F (36.7 C) 99.3 F (37.4 C)  TempSrc: Oral  Oral   SpO2: 100% 96% 99% 95%  Weight:      Height:        General: Pt is alert, awake, not in acute distress Cardiovascular: RRR, S1/S2 +, no rubs, no gallops Respiratory: CTA bilaterally, no wheezing, no rhonchi Abdominal: Soft, NT, ND, bowel sounds + Extremities: no edema, no cyanosis  The results of significant diagnostics from this hospitalization (including imaging, microbiology, ancillary and laboratory) are listed below for reference.     Microbiology: Recent Results (from the past 240 hour(s))  Surgical pcr screen     Status: None   Collection Time: 02/15/23 11:15 AM   Specimen: Nasal Mucosa; Nasal Swab  Result Value Ref Range Status   MRSA, PCR NEGATIVE NEGATIVE Final   Staphylococcus aureus NEGATIVE NEGATIVE Final    Comment: (NOTE) The Xpert SA Assay (FDA approved for NASAL specimens in patients 81 years of age and older), is one component of a comprehensive surveillance program. It is not intended to diagnose infection nor to guide or monitor treatment. Performed at [facility], [address], [place], [place] [address]      Labs: BNP (last 3 results) No results for input(s): "BNP" in the last 8760 hours. Basic Metabolic Panel: Recent Labs  Lab 02/15/23 0200 02/16/23 0400  NA 136 136  K 3.8 4.3  CL 103 103  CO2 24 24  GLUCOSE 175* 115*  BUN 16 28*  CREATININE 0.90 1.48*  CALCIUM 9.7 9.6   Liver Function Tests: Recent Labs  Lab 02/15/23 0200  AST 16  ALT 16  ALKPHOS 76  BILITOT 0.8  PROT 6.5  ALBUMIN 3.6   No results for input(s): "LIPASE",  "AMYLASE" in the last 168 hours. No results for input(s): "AMMONIA" in the last 168 hours. CBC: Recent Labs  Lab 02/15/23 0200 02/16/23 0400 02/17/23 0453  WBC 9.8 8.0 6.5  NEUTROABS 5.9  --   --   HGB 10.8* 8.1* 7.4*  HCT 33.4* 25.2* 22.8*  MCV 86.3 89.4 85.4  PLT 268 216 232   Cardiac Enzymes: No results for input(s): "CKTOTAL", "CKMB", "CKMBINDEX", "TROPONINI" in the last 168 hours. BNP: Invalid input(s): "POCBNP" CBG: Recent Labs  Lab 02/17/23 0852 02/17/23 1131 02/17/23 1611 02/17/23 1952 02/18/23 0801  GLUCAP 77 73 106* 131* 81   D-Dimer No results for input(s): "DDIMER" in the last 72 hours. Hgb A1c Recent Labs    02/15/23 1526  HGBA1C 6.8*   Lipid Profile Recent Labs    02/16/23 0400  CHOL 114  HDL 53  LDLCALC 51  TRIG 50  CHOLHDL 2.2   Thyroid function studies No results for input(s): "TSH", "T4TOTAL", "T3FREE", "THYROIDAB" in the last 72 hours.  Invalid input(s): "FREET3" Anemia work up No results for input(s): "VITAMINB12", "FOLATE", "FERRITIN", "TIBC", "IRON", "RETICCTPCT" in the last 72 hours. Urinalysis    Component Value Date/Time   COLORURINE YELLOW 02/15/2023 0204   APPEARANCEUR HAZY (A) 02/15/2023 0204   LABSPEC 1.013 02/15/2023 0204   PHURINE 6.0 02/15/2023 0204   GLUCOSEU 50 (A) 02/15/2023 0204   HGBUR NEGATIVE 02/15/2023 0204   BILIRUBINUR NEGATIVE 02/15/2023 0204   KETONESUR NEGATIVE 02/15/2023 0204   PROTEINUR NEGATIVE 02/15/2023 0204   UROBILINOGEN 0.2 12/10/2013 2029   NITRITE NEGATIVE 02/15/2023 0204   LEUKOCYTESUR MODERATE (A) 02/15/2023 0204   Sepsis Labs Recent Labs  Lab 02/15/23 0200 02/16/23 0400 02/17/23 0453  WBC 9.8 8.0 6.5   Microbiology Recent Results (from the past 240 hour(s))  Surgical pcr screen     Status: None   Collection Time: 02/15/23 11:15 AM   Specimen: Nasal Mucosa; Nasal Swab  Result Value Ref Range Status   MRSA, PCR NEGATIVE NEGATIVE Final   Staphylococcus aureus NEGATIVE NEGATIVE  Final    Comment: (NOTE) The Xpert SA Assay (FDA approved for NASAL specimens in patients 26 years of age and older), is one component of a comprehensive surveillance program. It is not intended to diagnose infection nor to guide or monitor treatment. Performed at [facility], [address] [place], [place] [address]      Time coordinating discharge: Over 30 minutes  SIGNED:   Azucena Fallen, DO [facility] 02/18/2023, 10:20 AM Pager   If 7PM-7AM, please contact night-coverage [email or web address]

## 2023-02-18 NOTE — Progress Notes (Signed)
Occupational Therapy Treatment Patient Details Name: [name] MRN: [id number] DOB: 11-24-1955 Today's Date: 02/18/2023   History of present illness Pt is 67 yo who presented to [facility] following ground level fall and sustained a R intertrochanteric femur fracture. Pt is currently s/p IM nailing on 02/15/23. 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   OT comments  Pt continues to have intermittent dizziness. Educated in fall prevention, avoiding inverting head with LB ADLs and compensatory strategies for LB bathing and dressing. Pt verbalized and/or demonstrated understanding. Mobilized to EOB and with CGA to supervision. Continue to recommend HHOT.      If plan is discharge home, recommend the following:  A little help with walking and/or transfers;A little help with bathing/dressing/bathroom;Assistance with cooking/housework;Assist for transportation;Help with stairs or ramp for entrance   Equipment Recommendations  BSC/3in1    Recommendations for Other Services      Precautions / Restrictions Precautions Precautions: Fall Restrictions Weight Bearing Restrictions: Yes RLE Weight Bearing: Weight bearing as tolerated       Mobility Bed Mobility Overal bed mobility: Needs Assistance Bed Mobility: Supine to Sit     Supine to sit: Contact guard          Transfers Overall transfer level: Needs assistance Equipment used: Rolling walker (2 wheels) Transfers: Sit to/from Stand Sit to Stand: Supervision           General transfer comment: cues for hand placement     Balance Overall balance assessment: Needs assistance   Sitting balance-[name] Scale: Good     Standing balance support: Bilateral upper extremity supported Standing balance-[name] Scale: Poor                             ADL either performed or assessed with clinical judgement    ADL Overall ADL's : Needs assistance/impaired             Lower Body Bathing: Supervison/ safety;Sit to/from stand;Sitting/lateral leans Lower Body Bathing Details (indicate cue type and reason): recommended long handled bath sponge to avoid inverting head     Lower Body Dressing: Supervision/safety;Sitting/lateral leans Lower Body Dressing Details (indicate cue type and reason): educated to dress R LE first and undress last, use of reacher to avoid inverting head, pt is able to reach her R toes without AD Toilet Transfer: Contact guard assist;Rolling walker (2 wheels);Ambulation   Toileting- Clothing Manipulation and Hygiene: Set up;Sitting/lateral lean     Tub/Shower Transfer Details (indicate cue type and reason): pt plans to sit to shower Functional mobility during ADLs: Contact guard assist;Rolling walker (2 wheels)      Extremity/Trunk Assessment              Vision       Perception     Praxis      Cognition Arousal: Alert Behavior During Therapy: WFL for tasks assessed/performed Overall Cognitive Status: Within Functional Limits for tasks assessed                                          Exercises      Shoulder Instructions       General Comments      Pertinent Vitals/ Pain       Pain Assessment Pain Assessment: No/denies pain  Home  Living                                          Prior Functioning/Environment              Frequency  Min 1X/week        Progress Toward Goals  OT Goals(current goals can now be found in the care plan section)  Progress towards OT goals: Progressing toward goals  Acute Rehab OT Goals OT Goal Formulation: With patient Time For Goal Achievement: 03/02/23 Potential to Achieve Goals: Good  Plan      Co-evaluation                 AM-PAC OT "6 Clicks" Daily Activity     Outcome Measure   Help from another person eating meals?: None Help from another  person taking care of personal grooming?: A Little Help from another person toileting, which includes using toliet, bedpan, or urinal?: A Little Help from another person bathing (including washing, rinsing, drying)?: A Little Help from another person to put on and taking off regular upper body clothing?: A Little Help from another person to put on and taking off regular lower body clothing?: A Little 6 Click Score: 19    End of Session Equipment Utilized During Treatment: Rolling walker (2 wheels);Gait belt  OT Visit Diagnosis: Unsteadiness on feet (R26.81);Other abnormalities of gait and mobility (R26.89);History of falling (Z91.81)   Activity Tolerance Patient tolerated treatment well   Patient Left in chair;with call bell/phone within reach   Nurse Communication          Time: 9528-4132 OT Time Calculation (min): 31 min  Charges: OT General Charges $OT Visit: 1 Visit OT Treatments $Self Care/Home Management : 23-37 mins  Berna Spare, OTR/L Acute Rehabilitation Services Office: [phone]   Evern Bio 02/18/2023, 10:16 AM

## 2023-02-18 NOTE — Plan of Care (Signed)

## 2023-02-18 NOTE — Care Management (Signed)
ED RN Case Manager received call from [facility] on 5N concerning DME not being delivered.  Reviewed chart and spoke with liaison with [employer] ,equipment was apparently returned. Contacted [employer] company they accepted the referral for equipment which will be delivered tonight prior to discharge, Updated Evan RN on 5N.

## 2023-02-19 NOTE — Anesthesia Postprocedure Evaluation (Signed)
Anesthesia Post Note  Patient: [name]  Procedure(s) Performed: INTRAMEDULLARY NAILING RIGHT HIP (Right: Hip)     Patient location during evaluation: PACU Anesthesia Type: General Level of consciousness: awake and alert Pain management: pain level controlled Vital Signs Assessment: post-procedure vital signs reviewed and stable Respiratory status: spontaneous breathing, nonlabored ventilation, respiratory function stable and patient connected to nasal cannula oxygen Cardiovascular status: blood pressure returned to baseline and stable Postop Assessment: no apparent nausea or vomiting Anesthetic complications: no   No notable events documented.  Last Vitals:  Vitals:   02/18/23 0802 02/18/23 1500  BP: 99/75 127/63  Pulse: 89 87  Resp: 18 19  Temp: 37.4 C 37 C  SpO2: 95% 99%    Last Pain:  Vitals:   02/18/23 1500  TempSrc: Oral  PainSc:                  Vern Guerette

## 2023-05-08 ENCOUNTER — Encounter: Payer: Self-pay | Admitting: Cardiology

## 2023-05-08 ENCOUNTER — Ambulatory Visit: Payer: Self-pay | Admitting: Cardiology

## 2023-05-08 ENCOUNTER — Other Ambulatory Visit: Payer: Self-pay | Admitting: Cardiology

## 2023-05-08 VITALS — BP 120/90 | HR 80 | Ht 66.0 in | Wt 142.0 lb

## 2023-05-08 DIAGNOSIS — Z1329 Encounter for screening for other suspected endocrine disorder: Secondary | ICD-10-CM

## 2023-05-08 DIAGNOSIS — Z794 Long term (current) use of insulin: Secondary | ICD-10-CM

## 2023-05-08 DIAGNOSIS — R300 Dysuria: Secondary | ICD-10-CM

## 2023-05-08 DIAGNOSIS — E785 Hyperlipidemia, unspecified: Secondary | ICD-10-CM

## 2023-05-08 DIAGNOSIS — E1159 Type 2 diabetes mellitus with other circulatory complications: Secondary | ICD-10-CM | POA: Diagnosis not present

## 2023-05-08 DIAGNOSIS — I10 Essential (primary) hypertension: Secondary | ICD-10-CM | POA: Diagnosis not present

## 2023-05-08 DIAGNOSIS — Z7689 Persons encountering health services in other specified circumstances: Secondary | ICD-10-CM | POA: Diagnosis not present

## 2023-05-08 LAB — POCT URINALYSIS DIPSTICK
Bilirubin, UA: NEGATIVE
Blood, UA: POSITIVE
Glucose, UA: NEGATIVE
Ketones, UA: NEGATIVE
Nitrite, UA: POSITIVE
Protein, UA: NEGATIVE
Spec Grav, UA: 1.025 (ref 1.010–1.025)
Urobilinogen, UA: 0.2 U/dL
pH, UA: 6 (ref 5.0–8.0)

## 2023-05-08 MED ORDER — TRESIBA FLEXTOUCH 200 UNIT/ML ~~LOC~~ SOPN: 20.0000 [IU] | PEN_INJECTOR | Freq: Every evening | SUBCUTANEOUS | 12 refills | Status: DC

## 2023-05-08 MED ORDER — CIPROFLOXACIN HCL 500 MG PO TABS: 500.0000 mg | ORAL_TABLET | Freq: Two times a day (BID) | ORAL | 0 refills | Status: DC

## 2023-05-08 MED ORDER — MECLIZINE HCL 12.5 MG PO TABS: 12.5000 mg | ORAL_TABLET | Freq: Three times a day (TID) | ORAL | 3 refills | Status: DC | PRN

## 2023-05-08 NOTE — Progress Notes (Signed)
New Patient Office Visit  Subjective    Patient ID: [name], female    DOB: 04-17-1956  Age: 67 y.o. MRN: [id number]  CC:  Chief Complaint  Patient presents with   Establish Care    HPI BAR STEINBERGER presents to establish care Previous Primary Care provider/office:   she does have additional concerns to discuss today.   Patient in office to establish care. Patient complaining of vertigo. Patient states she does a maneuver to help, limited now due to recent hip fracture. Will send in meclizine. If meclizine does not help, will send to ENT.  Patient fasting today, will do blood work.  Patient reports frequent bladder infections, thinks she may have one now. Complaining of dysuria, frequency, and urgency. Will get a UA today.  Over due for mammogram. Patient wants to discuss at next visit. Patient reports doing cologuard earlier this year, states she does not know the results.      Outpatient Encounter Medications as of 05/08/2023  Medication Sig   aspirin EC 81 MG tablet Take 81 mg by mouth daily.   clopidogrel (PLAVIX) 75 MG tablet Take 75 mg by mouth daily.   losartan (COZAAR) 100 MG tablet Take 100 mg by mouth daily.   meclizine (ANTIVERT) 12.5 MG tablet Take 1 tablet (12.5 mg total) by mouth 3 (three) times daily as needed for dizziness.   [DISCONTINUED] insulin degludec (TRESIBA FLEXTOUCH) 200 UNIT/ML FlexTouch Pen Inject 20 Units into the skin at bedtime.   atorvastatin (LIPITOR) 40 MG tablet Take 1 tablet (40 mg total) by mouth daily at 6 PM. (Patient taking differently: Take 20 mg by mouth daily.)   insulin degludec (TRESIBA FLEXTOUCH) 200 UNIT/ML FlexTouch Pen Inject 20 Units into the skin at bedtime.   [DISCONTINUED] ALPHA LIPOIC ACID PO Take 1 capsule by mouth daily. (Patient not taking: Reported on 05/08/2023)   [DISCONTINUED] FENUGREEK PO Take 2 tablets by mouth daily as needed (muscle stiffness). (Patient not taking: Reported on 05/08/2023)    [DISCONTINUED] Vitamin D-Vitamin K (D3 + K2 PO) Take 1 tablet by mouth daily. (Patient not taking: Reported on 05/08/2023)   No facility-administered encounter medications on file as of 05/08/2023.    Past Medical History:  Diagnosis Date   Anxiety    a. with claustrophobia   Arthritis    "right hand; maybe just starting in my left hand" (04/21/2012)   Asthma    CAD (coronary artery disease)    a. PCI [place] 2004. b. Angina - balloon angioplasty to LAD 04/21/12 (vessel too small for stent).   Carotid stenosis    a. 11/2011 critical R, ~30% L by angiogram;  b. 04/2012 s/p R CEA.   Cataract    left   Chronic lower back pain    Depression    a. occasionally takes [facility] john's wart   Diabetes mellitus type 2, uncontrolled    Dysrhythmia    Embolic stroke (HCC)    a. 11/2011 MRI: mult small b/l infarcts c/w emboli;  b. 11/2011 MRA neck: high grade RICA stenosis;  c. 6/20013 TEE: EF 55-60% with mild LVH, severe MAC, < 1 cm calc mobile structure on the MV annulus, ? 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 Thought was RICA stenosis did not explain the b/l small strokes; concern for emboli from mobile MV struct;  d. 01/2012 Event Mon:  no AFib   History of echocardiogram    Echo 12/16: mild LVH, EF 55-60%, no RWMA,  Gr 2 DD, trivial AI, MAC, mild MR, mild LAE   History of kidney stones    History of nuclear stress test    a. Myoview 12/16: EF 46%, no ischemia, Intermediate Risk (low EF)   HLD (hyperlipidemia)    a. 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 visual changes   Myocardial infarction [facility])    prior to 2013   Peripheral neuropathy    Refusal of blood transfusions as patient is Jehovah's Witness    Stroke (HCC) 2015- dec   mini stroke   Uncontrolled type 2 diabetes mellitus with circulatory disorder, with long-term current use of insulin 05/10/2015    Past Surgical History:  Procedure Laterality Date   ANKLE SURGERY   2005   after fall down stairs, left with screws and plate in place   CARDIAC CATHETERIZATION  2004   WNL, no significant coronary disease   CARDIAC CATHETERIZATION  12/19/11   CAROTID ANGIOGRAM Bilateral 12/19/2011   Procedure: CAROTID ANGIOGRAM;  Surgeon: Nada Libman, MD;  Location: [facility] CATH LAB;  Service: Cardiovascular;  Laterality: Bilateral;   CAROTID ENDARTERECTOMY     CARPAL TUNNEL RELEASE  ~ 2005   bilateral   CATARACT EXTRACTION W/ INTRAOCULAR LENS IMPLANT  2012   right   CATARACT EXTRACTION W/PHACO Left 10/2015   Dr Noel Gerold at [facility]   CORONARY ANGIOPLASTY  04/21/2012   95% stenosis mid LAD, vessel not large enough for stent, rec plavix/ASA for 1 mo   CORONARY ANGIOPLASTY WITH STENT PLACEMENT  2002   x2   ENDARTERECTOMY  05/09/2012   Procedure: ENDARTERECTOMY CAROTID;  Surgeon: Nada Libman, MD;  Location: [facility] OR;  Service: Vascular;  Laterality: Right;  Right carotd endarterectomy with bovine patch angioplasty   ENDARTERECTOMY Left 06/23/2015   Procedure: ENDARTERECTOMY CAROTID;  Surgeon: Nada Libman, MD;  Location: [facility] OR;  Service: Vascular;  Laterality: Left;   EYE SURGERY Left 04/2015   laser surgery,blood vessels rupturing   holter monitor  2013   WNL   INTRAMEDULLARY (IM) NAIL INTERTROCHANTERIC Right 02/15/2023   Procedure: INTRAMEDULLARY NAILING RIGHT HIP;  Surgeon: Roby Lofts, MD;  Location: [facility] OR;  Service: Orthopedics;  Laterality: Right;   KIDNEY STONE SURGERY     several times   LITHOTRIPSY     LOOP RECORDER INSERTION N/A 07/22/2019   Procedure: LOOP RECORDER INSERTION;  Surgeon: Marcina Millard, MD;  Location: [facility] INVASIVE CV LAB;  Service: Cardiovascular;  Laterality: N/A;   PARS PLANA VITRECTOMY Left 04/2015   [facility])   [facility] ANGIOPLASTY Left 06/23/2015   Procedure: PATCH ANGIOPLASTY USING 1cm x 4cm BOVINE PERICARDIAL PATCH;  Surgeon: Nada Libman, MD;  Location: [facility] OR;  Service: Vascular;  Laterality: Left;   PERCUTANEOUS CORONARY  STENT INTERVENTION (PCI-S) N/A 04/21/2012   Procedure: PERCUTANEOUS CORONARY STENT INTERVENTION (PCI-S);  Surgeon: Kathleene Hazel, MD;  Location: [facility] CATH LAB;  Service: Cardiovascular;  Laterality: N/A;   TEE WITHOUT CARDIOVERSION  12/14/2011   Procedure: TRANSESOPHAGEAL ECHOCARDIOGRAM (TEE);  Surgeon: Laurey Morale, MD;  Location[facility] ENDOSCOPY;  Service: Cardiovascular;  Laterality: N/A;   US ECHOCARDIOGRAPHY  2002   normal LV fxn, mild LVH, borderline L atrial size    Family History  Problem Relation Age of Onset   Heart disease Mother        valve problems   Stroke Mother    Hypertension Mother    Heart disease  Sister        valve problems   Heart disease Brother    Diabetes Father    Hypertension Father    AAA (abdominal aortic aneurysm) Father    Cancer Neg Hx     Social History   Socioeconomic History   Marital status: Married    Spouse name: Not on file   Number of children: Not on file   Years of education: Not on file   Highest education level: Not on file  Occupational History   Not on file  Tobacco Use   Smoking status: Former    Current packs/day: 0.00    Types: Cigarettes    Quit date: 06/25/1972    Years since quitting: 50.9   Smokeless tobacco: Never  Substance and Sexual Activity   Alcohol use: No    Comment: none since 11/2011   Drug use: No   Sexual activity: Yes    Birth control/protection: Post-menopausal  Other Topics Concern   Not on file  Social History Narrative   Caffeine: 2 cups/day coffee   Lives alone, husband in [place].  4 children nearby, son to live with her.   Occupation: unemployed, prior worked as IT consultant   Activity: stays active on farm   Diet: vegan, no processed foods, good water daily, fruits/vegetables daily   jehovah's witness - no blood products.   Social Determinants of Health   Financial Resource Strain: Not on file  Food Insecurity: No Food Insecurity (02/15/2023)   Hunger Vital Sign    Worried About Running  Out of Food in the Last Year: Never true    Ran Out of Food in the Last Year: Never true  Transportation Needs: No Transportation Needs (02/15/2023)   PRAPARE - Administrator, Civil Service (Medical): No    Lack of Transportation (Non-Medical): No  Physical Activity: Not on file  Stress: Not on file  Social Connections: Not on file  Intimate Partner Violence: Not At Risk (02/15/2023)   Humiliation, Afraid, Rape, and Kick questionnaire    Fear of Current or Ex-Partner: No    Emotionally Abused: No    Physically Abused: No    Sexually Abused: No    Review of Systems  Constitutional: Negative.  Negative for fever.  HENT: Negative.    Eyes: Negative.   Respiratory: Negative.  Negative for shortness of breath.   Cardiovascular: Negative.  Negative for chest pain.  Gastrointestinal: Negative.  Negative for abdominal pain, constipation and diarrhea.  Genitourinary:  Positive for dysuria, frequency and urgency. Negative for hematuria.  Musculoskeletal:  Negative for joint pain and myalgias.  Skin: Negative.   Neurological:  Positive for dizziness. Negative for headaches.  Endo/Heme/Allergies: Negative.   All other systems reviewed and are negative.      Objective    BP (!) 120/90 (BP Location: Left Arm, Patient Position: Sitting, Cuff Size: Small)   Pulse 80   Ht 5\' 6"  (1.676 m)   Wt 142 lb (64.4 kg)   SpO2 96%   BMI 22.92 kg/m   Physical Exam Vitals and nursing note reviewed.  Constitutional:      Appearance: Normal appearance. She is normal weight.  HENT:     Head: Normocephalic and atraumatic.     Nose: Nose normal.     Mouth/Throat:     Mouth: Mucous membranes are moist.  Eyes:     Extraocular Movements: Extraocular movements intact.     Conjunctiva/sclera: Conjunctivae normal.     Pupils:  Pupils are equal, round, and reactive to light.  Cardiovascular:     Rate and Rhythm: Normal rate and regular rhythm.     Pulses: Normal pulses.     Heart sounds:  Normal heart sounds.  Pulmonary:     Effort: Pulmonary effort is normal.     Breath sounds: Normal breath sounds.  Abdominal:     General: Abdomen is flat. Bowel sounds are normal.     Palpations: Abdomen is soft.  Musculoskeletal:        General: Normal range of motion.     Cervical back: Normal range of motion.  Skin:    General: Skin is warm and dry.  Neurological:     General: No focal deficit present.     Mental Status: She is alert and oriented to person, place, and time.  Psychiatric:        Mood and Affect: Mood normal.        Behavior: Behavior normal.        Thought Content: Thought content normal.        Judgment: Judgment normal.        Assessment & Plan:  Fasting lab work today.  Meclizine for vertigo.  UA today. Abnormal, sent in Cipro. Will send for culture.   Problem List Items Addressed This Visit       Cardiovascular and Mediastinum   Hypertension, accelerated   Relevant Orders   CMP14+EGFR     Endocrine   DM (diabetes mellitus), type 2 (HCC)   Relevant Medications   insulin degludec (TRESIBA FLEXTOUCH) 200 UNIT/ML FlexTouch Pen   Other Relevant Orders   CMP14+EGFR   Hemoglobin A1c     Other   HLD (hyperlipidemia)   Relevant Orders   Lipid Profile   Encounter to establish care - Primary   Other Visit Diagnoses     Thyroid disorder screening       Relevant Orders   TSH       Return in about 4 weeks (around 06/05/2023).   Total time spent: 25 minutes  [employer], NP  05/08/2023   This document may have been prepared by Dragon Voice Recognition software and as such may include unintentional dictation errors.

## 2023-05-09 LAB — CMP14+EGFR
ALT: 8 [IU]/L (ref 0–32)
AST: 14 [IU]/L (ref 0–40)
Albumin: 4.3 g/dL (ref 3.9–4.9)
Alkaline Phosphatase: 115 [IU]/L (ref 44–121)
BUN/Creatinine Ratio: 23 (ref 12–28)
BUN: 23 mg/dL (ref 8–27)
Bilirubin Total: 0.3 mg/dL (ref 0.0–1.2)
CO2: 23 mmol/L (ref 20–29)
Calcium: 11.3 mg/dL — ABNORMAL HIGH (ref 8.7–10.3)
Chloride: 103 mmol/L (ref 96–106)
Creatinine, Ser: 0.98 mg/dL (ref 0.57–1.00)
Globulin, Total: 2.7 g/dL (ref 1.5–4.5)
Glucose: 62 mg/dL — ABNORMAL LOW (ref 70–99)
Potassium: 4.7 mmol/L (ref 3.5–5.2)
Sodium: 141 mmol/L (ref 134–144)
Total Protein: 7 g/dL (ref 6.0–8.5)
eGFR: 63 mL/min/{1.73_m2} (ref >59)

## 2023-05-09 LAB — HEMOGLOBIN A1C
Est. average glucose Bld gHb Est-mCnc: 148 mg/dL
Hgb A1c MFr Bld: 6.8 % — ABNORMAL HIGH (ref 4.8–5.6)

## 2023-05-09 LAB — LIPID PANEL
Chol/HDL Ratio: 3.2 ratio (ref 0.0–4.4)
Cholesterol, Total: 194 mg/dL (ref 100–199)
HDL: 60 mg/dL (ref >39)
LDL Chol Calc (NIH): 119 mg/dL — ABNORMAL HIGH (ref 0–99)
Triglycerides: 84 mg/dL (ref 0–149)
VLDL Cholesterol Cal: 15 mg/dL (ref 5–40)

## 2023-05-09 LAB — TSH: TSH: 1.19 u[IU]/mL (ref 0.450–4.500)

## 2023-05-11 ENCOUNTER — Other Ambulatory Visit: Payer: Self-pay

## 2023-05-11 ENCOUNTER — Emergency Department: Payer: Medicare (Managed Care)

## 2023-05-11 ENCOUNTER — Inpatient Hospital Stay
Admission: EM | Admit: 2023-05-11 | Discharge: 2023-05-14 | DRG: 065 | Disposition: A | Discharge status: Home Health | Payer: Medicare (Managed Care) | Attending: Student | Admitting: Student

## 2023-05-11 ENCOUNTER — Inpatient Hospital Stay: Payer: Medicare (Managed Care)

## 2023-05-11 DIAGNOSIS — I6529 Occlusion and stenosis of unspecified carotid artery: Secondary | ICD-10-CM | POA: Diagnosis not present

## 2023-05-11 DIAGNOSIS — J45909 Unspecified asthma, uncomplicated: Secondary | ICD-10-CM | POA: Diagnosis present

## 2023-05-11 DIAGNOSIS — Z7902 Long term (current) use of antithrombotics/antiplatelets: Secondary | ICD-10-CM

## 2023-05-11 DIAGNOSIS — Z79899 Other long term (current) drug therapy: Secondary | ICD-10-CM | POA: Diagnosis not present

## 2023-05-11 DIAGNOSIS — H547 Unspecified visual loss: Secondary | ICD-10-CM | POA: Diagnosis present

## 2023-05-11 DIAGNOSIS — E785 Hyperlipidemia, unspecified: Secondary | ICD-10-CM | POA: Diagnosis present

## 2023-05-11 DIAGNOSIS — I2510 Atherosclerotic heart disease of native coronary artery without angina pectoris: Secondary | ICD-10-CM | POA: Diagnosis present

## 2023-05-11 DIAGNOSIS — I701 Atherosclerosis of renal artery: Secondary | ICD-10-CM | POA: Diagnosis present

## 2023-05-11 DIAGNOSIS — Z7982 Long term (current) use of aspirin: Secondary | ICD-10-CM

## 2023-05-11 DIAGNOSIS — F418 Other specified anxiety disorders: Secondary | ICD-10-CM | POA: Diagnosis not present

## 2023-05-11 DIAGNOSIS — Z1623 Resistance to quinolones and fluoroquinolones: Secondary | ICD-10-CM | POA: Diagnosis present

## 2023-05-11 DIAGNOSIS — I110 Hypertensive heart disease with heart failure: Secondary | ICD-10-CM | POA: Diagnosis present

## 2023-05-11 DIAGNOSIS — I252 Old myocardial infarction: Secondary | ICD-10-CM

## 2023-05-11 DIAGNOSIS — I5032 Chronic diastolic (congestive) heart failure: Secondary | ICD-10-CM | POA: Diagnosis present

## 2023-05-11 DIAGNOSIS — Z8249 Family history of ischemic heart disease and other diseases of the circulatory system: Secondary | ICD-10-CM

## 2023-05-11 DIAGNOSIS — Z531 Procedure and treatment not carried out because of patient's decision for reasons of belief and group pressure: Secondary | ICD-10-CM | POA: Diagnosis present

## 2023-05-11 DIAGNOSIS — E1142 Type 2 diabetes mellitus with diabetic polyneuropathy: Secondary | ICD-10-CM | POA: Diagnosis present

## 2023-05-11 DIAGNOSIS — F32A Depression, unspecified: Secondary | ICD-10-CM | POA: Diagnosis present

## 2023-05-11 DIAGNOSIS — Z833 Family history of diabetes mellitus: Secondary | ICD-10-CM

## 2023-05-11 DIAGNOSIS — N390 Urinary tract infection, site not specified: Secondary | ICD-10-CM | POA: Diagnosis present

## 2023-05-11 DIAGNOSIS — R29702 NIHSS score 2: Secondary | ICD-10-CM | POA: Diagnosis present

## 2023-05-11 DIAGNOSIS — I6329 Cerebral infarction due to unspecified occlusion or stenosis of other precerebral arteries: Principal | ICD-10-CM | POA: Diagnosis present

## 2023-05-11 DIAGNOSIS — R270 Ataxia, unspecified: Secondary | ICD-10-CM | POA: Diagnosis present

## 2023-05-11 DIAGNOSIS — Z87442 Personal history of urinary calculi: Secondary | ICD-10-CM

## 2023-05-11 DIAGNOSIS — Z87891 Personal history of nicotine dependence: Secondary | ICD-10-CM | POA: Diagnosis not present

## 2023-05-11 DIAGNOSIS — G8191 Hemiplegia, unspecified affecting right dominant side: Secondary | ICD-10-CM | POA: Diagnosis present

## 2023-05-11 DIAGNOSIS — I10 Essential (primary) hypertension: Secondary | ICD-10-CM | POA: Diagnosis not present

## 2023-05-11 DIAGNOSIS — R490 Dysphonia: Secondary | ICD-10-CM | POA: Diagnosis present

## 2023-05-11 DIAGNOSIS — Z634 Disappearance and death of family member: Secondary | ICD-10-CM | POA: Diagnosis not present

## 2023-05-11 DIAGNOSIS — I639 Cerebral infarction, unspecified: Secondary | ICD-10-CM | POA: Diagnosis present

## 2023-05-11 DIAGNOSIS — E1159 Type 2 diabetes mellitus with other circulatory complications: Secondary | ICD-10-CM | POA: Diagnosis present

## 2023-05-11 DIAGNOSIS — G8929 Other chronic pain: Secondary | ICD-10-CM | POA: Diagnosis present

## 2023-05-11 DIAGNOSIS — Z91040 Latex allergy status: Secondary | ICD-10-CM

## 2023-05-11 DIAGNOSIS — R29898 Other symptoms and signs involving the musculoskeletal system: Principal | ICD-10-CM

## 2023-05-11 DIAGNOSIS — I69398 Other sequelae of cerebral infarction: Secondary | ICD-10-CM | POA: Diagnosis not present

## 2023-05-11 DIAGNOSIS — Z794 Long term (current) use of insulin: Secondary | ICD-10-CM

## 2023-05-11 DIAGNOSIS — Z955 Presence of coronary angioplasty implant and graft: Secondary | ICD-10-CM

## 2023-05-11 DIAGNOSIS — B9620 Unspecified Escherichia coli [E. coli] as the cause of diseases classified elsewhere: Secondary | ICD-10-CM | POA: Diagnosis present

## 2023-05-11 DIAGNOSIS — Z880 Allergy status to penicillin: Secondary | ICD-10-CM

## 2023-05-11 DIAGNOSIS — G43109 Migraine with aura, not intractable, without status migrainosus: Secondary | ICD-10-CM | POA: Diagnosis present

## 2023-05-11 DIAGNOSIS — I340 Nonrheumatic mitral (valve) insufficiency: Secondary | ICD-10-CM | POA: Diagnosis not present

## 2023-05-11 DIAGNOSIS — M545 Low back pain, unspecified: Secondary | ICD-10-CM | POA: Diagnosis present

## 2023-05-11 DIAGNOSIS — E119 Type 2 diabetes mellitus without complications: Secondary | ICD-10-CM | POA: Diagnosis not present

## 2023-05-11 DIAGNOSIS — Z888 Allergy status to other drugs, medicaments and biological substances status: Secondary | ICD-10-CM

## 2023-05-11 DIAGNOSIS — F40240 Claustrophobia: Secondary | ICD-10-CM | POA: Diagnosis present

## 2023-05-11 DIAGNOSIS — I63011 Cerebral infarction due to thrombosis of right vertebral artery: Secondary | ICD-10-CM | POA: Diagnosis not present

## 2023-05-11 DIAGNOSIS — Z823 Family history of stroke: Secondary | ICD-10-CM

## 2023-05-11 LAB — APTT: aPTT: 32 s (ref 24–36)

## 2023-05-11 LAB — COMPREHENSIVE METABOLIC PANEL
ALT: 11 U/L (ref 0–44)
AST: 17 U/L (ref 15–41)
Albumin: 4.2 g/dL (ref 3.5–5.0)
Alkaline Phosphatase: 93 U/L (ref 38–126)
Anion gap: 7 (ref 5–15)
BUN: 20 mg/dL (ref 8–23)
CO2: 25 mmol/L (ref 22–32)
Calcium: 10.3 mg/dL (ref 8.9–10.3)
Chloride: 107 mmol/L (ref 98–111)
Creatinine, Ser: 0.9 mg/dL (ref 0.44–1.00)
GFR, Estimated: >60 mL/min (ref >60)
Glucose, Bld: 85 mg/dL (ref 70–99)
Potassium: 3.7 mmol/L (ref 3.5–5.1)
Sodium: 139 mmol/L (ref 135–145)
Total Bilirubin: 0.5 mg/dL (ref <1.2)
Total Protein: 7.5 g/dL (ref 6.5–8.1)

## 2023-05-11 LAB — PROTIME-INR
INR: 1.1 (ref 0.8–1.2)
Prothrombin Time: 14.3 s (ref 11.4–15.2)

## 2023-05-11 LAB — DIFFERENTIAL
Abs Immature Granulocytes: 0.01 10*3/uL (ref 0.00–0.07)
Basophils Absolute: 0 10*3/uL (ref 0.0–0.1)
Basophils Relative: 1 %
Eosinophils Absolute: 0.2 10*3/uL (ref 0.0–0.5)
Eosinophils Relative: 4 %
Immature Granulocytes: 0 %
Lymphocytes Relative: 41 %
Lymphs Abs: 1.7 10*3/uL (ref 0.7–4.0)
Monocytes Absolute: 0.3 10*3/uL (ref 0.1–1.0)
Monocytes Relative: 8 %
Neutro Abs: 1.9 10*3/uL (ref 1.7–7.7)
Neutrophils Relative %: 46 %

## 2023-05-11 LAB — CBC
HCT: 36.2 % (ref 36.0–46.0)
Hemoglobin: 11.8 g/dL — ABNORMAL LOW (ref 12.0–15.0)
MCH: 28.4 pg (ref 26.0–34.0)
MCHC: 32.6 g/dL (ref 30.0–36.0)
MCV: 87.2 fL (ref 80.0–100.0)
Platelets: 310 10*3/uL (ref 150–400)
RBC: 4.15 MIL/uL (ref 3.87–5.11)
RDW: 12.5 % (ref 11.5–15.5)
WBC: 4.2 10*3/uL (ref 4.0–10.5)
nRBC: 0 % (ref 0.0–0.2)

## 2023-05-11 LAB — GLUCOSE, CAPILLARY: Glucose-Capillary: 76 mg/dL (ref 70–99)

## 2023-05-11 MED ORDER — LORAZEPAM 2 MG/ML IJ SOLN: 0.5000 mg | INTRAMUSCULAR | Status: DC | PRN

## 2023-05-11 MED ORDER — ALBUTEROL SULFATE HFA 108 (90 BASE) MCG/ACT IN AERS: 2.0000 | INHALATION_SPRAY | RESPIRATORY_TRACT | Status: DC | PRN

## 2023-05-11 MED ORDER — ACETAMINOPHEN 325 MG PO TABS: 650.0000 mg | ORAL_TABLET | ORAL | Status: DC | PRN

## 2023-05-11 MED ORDER — ONDANSETRON HCL 4 MG/2ML IJ SOLN: 4.0000 mg | Freq: Three times a day (TID) | INTRAMUSCULAR | Status: DC | PRN

## 2023-05-11 MED ORDER — SODIUM CHLORIDE 0.9 % IV SOLN
1.0000 g | INTRAVENOUS | Status: DC
  Administered 2023-05-11 – 2023-05-12 (×2): 1 g via INTRAVENOUS
  Filled 2023-05-11 (×2): qty 10

## 2023-05-11 MED ORDER — STROKE: EARLY STAGES OF RECOVERY BOOK: Freq: Once | Status: AC

## 2023-05-11 MED ORDER — ALBUTEROL SULFATE (2.5 MG/3ML) 0.083% IN NEBU: 2.5000 mg | INHALATION_SOLUTION | RESPIRATORY_TRACT | Status: DC | PRN

## 2023-05-11 MED ORDER — ENOXAPARIN SODIUM 40 MG/0.4ML IJ SOSY
40.0000 mg | PREFILLED_SYRINGE | INTRAMUSCULAR | Status: DC
  Administered 2023-05-11 – 2023-05-13 (×3): 40 mg via SUBCUTANEOUS
  Filled 2023-05-11 (×3): qty 0.4

## 2023-05-11 MED ORDER — LORAZEPAM 2 MG/ML IJ SOLN
1.0000 mg | INTRAMUSCULAR | Status: DC | PRN
  Administered 2023-05-11: 1 mg via INTRAVENOUS
  Filled 2023-05-11: qty 1

## 2023-05-11 MED ORDER — SODIUM CHLORIDE 0.9% FLUSH: 3.0000 mL | Freq: Once | INTRAVENOUS | Status: DC

## 2023-05-11 MED ORDER — DM-GUAIFENESIN ER 30-600 MG PO TB12: 1.0000 | ORAL_TABLET | Freq: Two times a day (BID) | ORAL | Status: DC | PRN

## 2023-05-11 MED ORDER — IOHEXOL 350 MG/ML SOLN
75.0000 mL | Freq: Once | INTRAVENOUS | Status: AC | PRN
  Administered 2023-05-11: 75 mL via INTRAVENOUS

## 2023-05-11 MED ORDER — ACETAMINOPHEN 650 MG RE SUPP: 650.0000 mg | RECTAL | Status: DC | PRN

## 2023-05-11 MED ORDER — ACETAMINOPHEN 160 MG/5ML PO SOLN: 650.0000 mg | ORAL | Status: DC | PRN

## 2023-05-11 MED ORDER — MECLIZINE HCL 25 MG PO TABS: 25.0000 mg | ORAL_TABLET | Freq: Three times a day (TID) | ORAL | Status: DC | PRN

## 2023-05-11 MED ORDER — HYDRALAZINE HCL 20 MG/ML IJ SOLN: 5.0000 mg | INTRAMUSCULAR | Status: DC | PRN

## 2023-05-11 MED ORDER — ASPIRIN 81 MG PO CHEW
324.0000 mg | CHEWABLE_TABLET | Freq: Once | ORAL | Status: AC
  Administered 2023-05-11: 324 mg via ORAL
  Filled 2023-05-11: qty 4

## 2023-05-11 MED ORDER — INSULIN ASPART 100 UNIT/ML IJ SOLN: 0.0000 [IU] | Freq: Every day | INTRAMUSCULAR | Status: DC

## 2023-05-11 MED ORDER — INSULIN ASPART 100 UNIT/ML IJ SOLN
0.0000 [IU] | Freq: Three times a day (TID) | INTRAMUSCULAR | Status: DC
  Administered 2023-05-12: 1 [IU] via SUBCUTANEOUS
  Administered 2023-05-12: 2 [IU] via SUBCUTANEOUS
  Administered 2023-05-12: 1 [IU] via SUBCUTANEOUS
  Administered 2023-05-13 – 2023-05-14 (×2): 2 [IU] via SUBCUTANEOUS
  Filled 2023-05-11 (×5): qty 1

## 2023-05-11 NOTE — ED Triage Notes (Signed)
Pt to ED for right sided weakness started yesterday morning. Clear speech.

## 2023-05-11 NOTE — H&P (Signed)
History and Physical    [name]:[id number] DOB: 08/19/1955 DOA: 05/11/2023  Referring MD/NP/PA:   PCP: Marisue Ivan, NP   Patient coming from:  The patient is coming from home.     Chief Complaint: right sided weakness  HPI: [name] is a 67 y.o. female with medical history significant of stroke, carotid artery stenosis (s/p of endarterectomy), HTN, HLD, CAD, DM, dCHF, depression with anxiety, migraine headaches, kidney stone, Jehovah witness, who presents with right-sided weakness.  Patient states that her right-sided weakness in arm and leg started the yesterday morning, which has been persistent.  No numbness or tingling in extremities.  No facial droop or slurred speech.  Patient states that she has poor vision after previous stroke, which has not changed.  No chest pain, cough, SOB.  No fever or chills.  No nausea, vomiting, diarrhea or abdominal pain.  No symptoms of UTI.  Patient is started on ciprofloxacin on 05/08/2023 for UTI.  She still has dysuria, burning with urination and increased urinary frequency today.  Patient reports intermittent vertigo.  Data reviewed independently and ED Course: pt was found to have WBC 4.2, INR 1.1, PTT 32, GFR> 60, temperature normal, blood pressure 114/66, heart rate 114, 74, RR 21, oxygen saturation 100% on room air.  MRI of the brain showed stroke in left pons.  Patient is admitted to telemetry bed as inpatient.  Message sent to Dr. Iver Nestle of neurology for consult.   MRI-brain 1. Acute infarct in the left pons. 2. Remote infarcts and chronic microvascular ischemic change.  CT-head: No acute intracranial abnormality.  Stable old right parietal infarct, old right thalamic lacunar infarct, and chronic small vessel disease.     EKG: I have personally reviewed.  Sinus rhythm, QTc 479, early R wave progression, nonspecific T wave change.   Review of Systems:   General: no fevers, chills, no body weight gain, has  fatigue HEENT: no blurry vision, hearing changes or sore throat Respiratory: no dyspnea, coughing, wheezing CV: no chest pain, no palpitations GI: no nausea, vomiting, abdominal pain, diarrhea, constipation GU: no dysuria, burning on urination, increased urinary frequency, hematuria  Ext: no leg edema Neuro: has right sided weakness, no hearing loss. Has vertigo. Skin: no rash, no skin tear. MSK: No muscle spasm, no deformity, no limitation of range of movement in spin Heme: No easy bruising.  Travel history: No recent long distant travel.   Allergy:  Allergies  Allergen Reactions   Other Shortness Of Breath    No blood or blood products   Penicillins Itching and Rash    Rash occurred ass a teenager -- took for strep throat.   Toradol [Ketorolac Tromethamine] Other (See Comments)    Panic attack    Imdur [Isosorbide Nitrate] Other (See Comments)    Headaches    Latex Rash    Rash on all points of contact (thick, heavy itching rash)   Metformin And Related Diarrhea and Nausea And Vomiting    Past Medical History:  Diagnosis Date   Anxiety    a. with claustrophobia   Arthritis    "right hand; maybe just starting in my left hand" (04/21/2012)   Asthma    CAD (coronary artery disease)    a. PCI [place] 2004. b. Angina - balloon angioplasty to LAD 04/21/12 (vessel too small for stent).   Carotid stenosis    a. 11/2011 critical R, ~30% L by angiogram;  b. 04/2012 s/p R CEA.   Cataract  left   Chronic lower back pain    Depression    a. occasionally takes [place] john's wart   Diabetes mellitus type 2, uncontrolled    Dysrhythmia    Embolic stroke (HCC)    a. 11/2011 MRI: mult small b/l infarcts c/w emboli;  b. 11/2011 MRA neck: high grade RICA stenosis;  c. 6/20013 TEE: EF 55-60% with mild LVH, severe MAC, < 1 cm calc mobile structure on the MV annulus, ? 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 Thought was RICA stenosis did not explain the b/l small strokes;  concern for emboli from mobile MV struct;  d. 01/2012 Event Mon:  no AFib   History of echocardiogram    Echo 12/16: mild LVH, EF 55-60%, no RWMA, Gr 2 DD, trivial AI, MAC, mild MR, mild LAE   History of kidney stones    History of nuclear stress test    a. Myoview 12/16: EF 46%, no ischemia, Intermediate Risk (low EF)   HLD (hyperlipidemia)    a. 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 visual changes   Myocardial infarction [facility])    prior to 2013   Peripheral neuropathy    Refusal of blood transfusions as patient is Jehovah's Witness    Stroke (HCC) 2015- dec   mini stroke   Uncontrolled type 2 diabetes mellitus with circulatory disorder, with long-term current use of insulin 05/10/2015    Past Surgical History:  Procedure Laterality Date   ANKLE SURGERY  2005   after fall down stairs, left with screws and plate in place   CARDIAC CATHETERIZATION  2004   WNL, no significant coronary disease   CARDIAC CATHETERIZATION  12/19/11   CAROTID ANGIOGRAM Bilateral 12/19/2011   Procedure: CAROTID ANGIOGRAM;  Surgeon: Nada Libman, MD;  Location: [facility] CATH LAB;  Service: Cardiovascular;  Laterality: Bilateral;   CAROTID ENDARTERECTOMY     CARPAL TUNNEL RELEASE  ~ 2005   bilateral   CATARACT EXTRACTION W/ INTRAOCULAR LENS IMPLANT  2012   right   CATARACT EXTRACTION W/PHACO Left 10/2015   Dr Noel Gerold at [facility]   CORONARY ANGIOPLASTY  04/21/2012   95% stenosis mid LAD, vessel not large enough for stent, rec plavix/ASA for 1 mo   CORONARY ANGIOPLASTY WITH STENT PLACEMENT  2002   x2   ENDARTERECTOMY  05/09/2012   Procedure: ENDARTERECTOMY CAROTID;  Surgeon: Nada Libman, MD;  Location: [facility] OR;  Service: Vascular;  Laterality: Right;  Right carotd endarterectomy with bovine patch angioplasty   ENDARTERECTOMY Left 06/23/2015   Procedure: ENDARTERECTOMY CAROTID;  Surgeon: Nada Libman, MD;  Location: [facility] OR;  Service: Vascular;  Laterality: Left;    EYE SURGERY Left 04/2015   laser surgery,blood vessels rupturing   holter monitor  2013   WNL   INTRAMEDULLARY (IM) NAIL INTERTROCHANTERIC Right 02/15/2023   Procedure: INTRAMEDULLARY NAILING RIGHT HIP;  Surgeon: Roby Lofts, MD;  Location: [facility] OR;  Service: Orthopedics;  Laterality: Right;   KIDNEY STONE SURGERY     several times   LITHOTRIPSY     LOOP RECORDER INSERTION N/A 07/22/2019   Procedure: LOOP RECORDER INSERTION;  Surgeon: Marcina Millard, MD;  Location: [facility] INVASIVE CV LAB;  Service: Cardiovascular;  Laterality: N/A;   PARS PLANA VITRECTOMY Left 04/2015   [facility])   [facility] ANGIOPLASTY Left 06/23/2015   Procedure: PATCH ANGIOPLASTY USING 1cm x 4cm BOVINE PERICARDIAL PATCH;  Surgeon: Faylene Million  Janae Bridgeman, MD;  Location: [facility] OR;  Service: Vascular;  Laterality: Left;   PERCUTANEOUS CORONARY STENT INTERVENTION (PCI-S) N/A 04/21/2012   Procedure: PERCUTANEOUS CORONARY STENT INTERVENTION (PCI-S);  Surgeon: Kathleene Hazel, MD;  Location: [facility] CATH LAB;  Service: Cardiovascular;  Laterality: N/A;   TEE WITHOUT CARDIOVERSION  12/14/2011   Procedure: TRANSESOPHAGEAL ECHOCARDIOGRAM (TEE);  Surgeon: Laurey Morale, MD;  Location: [facility] ENDOSCOPY;  Service: Cardiovascular;  Laterality: N/A;   US ECHOCARDIOGRAPHY  2002   normal LV fxn, mild LVH, borderline L atrial size    Social History:  reports that she quit smoking about 50 years ago. Her smoking use included cigarettes. She has never used smokeless tobacco. She reports that she does not drink alcohol and does not use drugs.  Family History:  Family History  Problem Relation Age of Onset   Heart disease Mother        valve problems   Stroke Mother    Hypertension Mother    Heart disease Sister        valve problems   Heart disease Brother    Diabetes Father    Hypertension Father    AAA (abdominal aortic aneurysm) Father    Cancer Neg Hx      Prior to Admission medications   Medication Sig Start Date End Date  Taking? Authorizing Provider  aspirin EC 81 MG tablet Take 81 mg by mouth daily.    [provider]  atorvastatin (LIPITOR) 40 MG tablet Take 1 tablet (40 mg total) by mouth daily at 6 PM. Patient taking differently: Take 20 mg by mouth daily. 03/18/19 04/06/23  Ihor Austin, MD  ciprofloxacin (CIPRO) 500 MG tablet Take 1 tablet (500 mg total) by mouth 2 (two) times daily for 7 days. 05/08/23 05/15/23  Scoggins, Amber, NP  clopidogrel (PLAVIX) 75 MG tablet Take 75 mg by mouth daily. 04/09/19   [provider]  insulin degludec (TRESIBA FLEXTOUCH) 200 UNIT/ML FlexTouch Pen Inject 20 Units into the skin at bedtime. 05/08/23   Scoggins, Amber, NP  losartan (COZAAR) 100 MG tablet Take 100 mg by mouth daily.    [provider]  meclizine (ANTIVERT) 12.5 MG tablet Take 1 tablet (12.5 mg total) by mouth 3 (three) times daily as needed for dizziness. 05/08/23   Marisue Ivan, NP    Physical Exam: Vitals:   05/11/23 1430 05/11/23 1528 05/11/23 1700 05/11/23 2031  BP: (!) 154/83  114/66 (!) 153/98  Pulse: 85  74 92  Resp: 16  (!) 21 18  Temp:  97.9 F (36.6 C)  (!) 97.5 F (36.4 C)  TempSrc:  Oral  Oral  SpO2: 100%  99% 100%  Weight:      Height:       General: Not in acute distress HEENT:       Eyes: PERRL, EOMI, no jaundice       ENT: No discharge from the ears and nose, no pharynx injection, no tonsillar enlargement.        Neck: No JVD, no bruit, no mass felt. Heme: No neck lymph node enlargement. Cardiac: S1/S2, RRR, No murmurs, No gallops or rubs. Respiratory: No rales, wheezing, rhonchi or rubs. GI: Soft, nondistended, nontender, no rebound pain, no organomegaly, BS present. GU: No hematuria Ext: No pitting leg edema bilaterally. 1+DP/PT pulse bilaterally. Musculoskeletal: No joint deformities, No joint redness or warmth, no limitation of ROM in spin. Skin: No rashes.  Neuro: Alert, oriented X3, cranial nerves II-XII grossly intact, moves all  extremities normally. Muscle strength 4/5 in right leg and 5/5 in all other extremities, sensation to light touch intact. Brachial reflex 2+ bilaterally. Psych: Patient is not psychotic, no suicidal or hemocidal ideation.  Labs on Admission: I have personally reviewed following labs and imaging studies  CBC: Recent Labs  Lab 05/11/23 1011  WBC 4.2  NEUTROABS 1.9  HGB 11.8*  HCT 36.2  MCV 87.2  PLT 310   Basic Metabolic Panel: Recent Labs  Lab 05/08/23 1051 05/11/23 1011  NA 141 139  K 4.7 3.7  CL 103 107  CO2 23 25  GLUCOSE 62* 85  BUN 23 20  CREATININE 0.98 0.90  CALCIUM 11.3* 10.3   GFR: Estimated Creatinine Clearance: 56.8 mL/min (by C-G formula based on SCr of 0.9 mg/dL). Liver Function Tests: Recent Labs  Lab 05/08/23 1051 05/11/23 1011  AST 14 17  ALT 8 11  ALKPHOS 115 93  BILITOT 0.3 0.5  PROT 7.0 7.5  ALBUMIN 4.3 4.2   No results for input(s): "LIPASE", "AMYLASE" in the last 168 hours. No results for input(s): "AMMONIA" in the last 168 hours. Coagulation Profile: Recent Labs  Lab 05/11/23 1011  INR 1.1   Cardiac Enzymes: No results for input(s): "CKTOTAL", "CKMB", "CKMBINDEX", "TROPONINI" in the last 168 hours. BNP (last 3 results) No results for input(s): "PROBNP" in the last 8760 hours. HbA1C: No results for input(s): "HGBA1C" in the last 72 hours. CBG: Recent Labs  Lab 05/11/23 2151  GLUCAP 76   Lipid Profile: No results for input(s): "CHOL", "HDL", "LDLCALC", "TRIG", "CHOLHDL", "LDLDIRECT" in the last 72 hours. Thyroid Function Tests: No results for input(s): "TSH", "T4TOTAL", "FREET4", "T3FREE", "THYROIDAB" in the last 72 hours. Anemia Panel: No results for input(s): "VITAMINB12", "FOLATE", "FERRITIN", "TIBC", "IRON", "RETICCTPCT" in the last 72 hours. Urine analysis:    Component Value Date/Time   COLORURINE YELLOW 02/15/2023 0204   APPEARANCEUR HAZY (A) 02/15/2023 0204   LABSPEC 1.013 02/15/2023 0204   PHURINE 6.0 02/15/2023  0204   GLUCOSEU 50 (A) 02/15/2023 0204   HGBUR NEGATIVE 02/15/2023 0204   BILIRUBINUR neg 05/08/2023 1052   KETONESUR NEGATIVE 02/15/2023 0204   PROTEINUR Negative 05/08/2023 1052   PROTEINUR NEGATIVE 02/15/2023 0204   UROBILINOGEN 0.2 05/08/2023 1052   UROBILINOGEN 0.2 12/10/2013 2029   NITRITE pos 05/08/2023 1052   NITRITE NEGATIVE 02/15/2023 0204   LEUKOCYTESUR Moderate (2+) (A) 05/08/2023 1052   LEUKOCYTESUR MODERATE (A) 02/15/2023 0204   Sepsis Labs: @LABRCNTIP (procalcitonin:4,lacticidven:4) )No results found for this or any previous visit (from the past 240 hour(s)).   Radiological Exams on Admission: MR BRAIN WO CONTRAST  Result Date: 05/11/2023 EXAM: MRI HEAD WITHOUT CONTRAST TECHNIQUE: Multiplanar, multiecho pulse sequences of the brain and surrounding structures were obtained without intravenous contrast. COMPARISON:  CT head from today. FINDINGS: Brain: Acute infarct in the left pons. No acute hemorrhage, hydrocephalus, extra-axial collection or mass lesion. Remote infarct in the right parietal lobe. Remote lacunar infarct in the right thalamus. Scattered T2/FLAIR hyperintensities in the white matter, compatible with chronic microvascular ischemic disease. Punctate foci of susceptibility artifact in the pons, compatible with prior microhemorrhages. Vascular: Major arterial flow voids are maintained at the skull base. Skull and upper cervical spine: Normal marrow signal. Sinuses/Orbits: Clear sinuses.  No acute orbital findings. Other: No mastoid effusions. IMPRESSION: 1. Acute infarct in the left pons. 2. Remote infarcts and chronic microvascular ischemic change. Electronically Signed   By: Feliberto Harts M.D.   On: 05/11/2023 18:21   CT HEAD WO  CONTRAST  Result Date: 05/11/2023 CLINICAL DATA:  Acute right-sided weakness beginning yesterday. Acute neurologic deficit. EXAM: CT HEAD WITHOUT CONTRAST TECHNIQUE: Contiguous axial images were obtained from the base of the skull  through the vertex without intravenous contrast. RADIATION DOSE REDUCTION: This exam was performed according to the departmental dose-optimization program which includes automated exposure control, adjustment of the mA and/or kV according to patient size and/or use of iterative reconstruction technique. COMPARISON:  02/15/2023 FINDINGS: Brain: No evidence of intracranial hemorrhage, acute infarction, hydrocephalus, extra-axial collection, or mass lesion/mass effect. Stable old right posterior parietal infarct. Stable old right thalamic lacunar infarct and chronic small vessel disease. Vascular:  No hyperdense vessel or other acute findings. Skull: No evidence of fracture or other significant bone abnormality. Sinuses/Orbits:  No acute findings. Other: None. IMPRESSION: No acute intracranial abnormality. Stable old right parietal infarct, old right thalamic lacunar infarct, and chronic small vessel disease. Electronically Signed   By: Danae Orleans M.D.   On: 05/11/2023 10:58      Assessment/Plan Principal Problem:   Stroke [facility]) Active Problems:   UTI (urinary tract infection)   Carotid stenosis   CAD (coronary artery disease)   HLD (hyperlipidemia)   HTN (hypertension)   Asthma   Diabetes mellitus without complication (HCC)   Assessment and Plan:  Stroke [facility]): MRI showed acute infarct in the left pons. Message sent to Dr. Iver Nestle of neurology for consult.  -Admit to tele med bed as inpatient - CTA of  head and neck - will hold oral Bp meds to allow permissive HTN - Plavix and ASA  - Statin: lipitor - fasting lipid panel (recent A1c 6.8 on 05/08/2023) - 2D transthoracic echocardiography  - swallowing screen. If fails, will get SLP - Check UDS  - PT/OT consult  UTI (urinary tract infection): pt is on ciprofloxacin at home, still has symptoms for UTI. -Changed to IV Rocephin -Repeat urine analysis and culture  Carotid stenosis -Aspirin, Plavix, Lipitor  CAD (coronary artery  disease) -Aspirin, Plavix, Lipitor  HLD (hyperlipidemia) -Lipitor  HTN (hypertension) - prn IV hydralazine for SBP>220 or dBP>110 -hold [place]  Asthma: Stable -As needed bronchodilators  Diabetes mellitus without complication (HCC): Recent A1c 6.8, well-controlled.  Patient is taking [place] 20 unit daily -Glargine insulin 10 unit daily -Sliding scale insulin  Anxiety -As needed Ativan    DVT ppx: SQ Lovenox  Code Status: Full code     Family Communication: Yes, patient's son-in-law at bed side.   Disposition Plan:  Anticipate discharge back to previous environment  Consults called:  Message sent to Dr. Iver Nestle of neurology for consult.  Admission status and Level of care: Telemetry Medical:     as inpt       Dispo: The patient is from: Home              Anticipated d/c is to: Home              Anticipated d/c date is: 2 days              Patient currently is not medically stable to d/c.    Severity of Illness:  The appropriate patient status for this patient is INPATIENT. Inpatient status is judged to be reasonable and necessary in order to provide the required intensity of service to ensure the patient's safety. The patient's presenting symptoms, physical exam findings, and initial radiographic and laboratory data in the context of their chronic comorbidities is felt to place them at high risk for  further clinical deterioration. Furthermore, it is not anticipated that the patient will be medically stable for discharge from the hospital within 2 midnights of admission.   * I certify that at the point of admission it is my clinical judgment that the patient will require inpatient hospital care spanning beyond 2 midnights from the point of admission due to high intensity of service, high risk for further deterioration and high frequency of surveillance required.*       Date of Service 05/12/2023    Lorretta Harp [facility]   If 7PM-7AM, please contact  night-coverage [email or web address] 05/12/2023, 12:20 AM

## 2023-05-11 NOTE — ED Provider Notes (Signed)
Procedures     ----------------------------------------- 6:50 PM on 05/11/2023 -----------------------------------------  Patient reassessed, no change in symptoms.  MRI shows left pontine CVA.  Case discussed with hospitalist.    Sharman Cheek, MD 05/11/23 [phone]

## 2023-05-11 NOTE — ED Notes (Signed)
Pt assisted to [facility] with x2 assist. Unsteady gait and bilateral lower extremity weakness. Pt unable to ambulate without assistance x2.

## 2023-05-11 NOTE — ED Provider Notes (Signed)
[facility] Provider Note    Event Date/Time   First MD Initiated Contact with Patient 05/11/23 1312     (approximate)   History   Chief Complaint Weakness   HPI  [name] is a 67 y.o. female with past medical history of hypertension, diabetes, CAD, stroke, and migraines who presents to the ED complaining of weakness.  Patient reports that she has noticed weakness in her right leg since yesterday afternoon associated with some discomfort.  She denies any recent trauma to the leg, but did have fixation of right hip fracture about 1 month ago.  She states that the leg was moving normally during physical therapy until yesterday she noticed that she was having trouble lifting it.  There has been some discomfort in her knee radiating upwards, but she has not noticed any redness or swelling.  She reports she had some confusion and vision problems with prior strokes, denies any history of leg weakness.     Physical Exam   Triage Vital Signs: ED Triage Vitals [05/11/23 1009]  Encounter Vitals Group     BP (!) 164/101     Systolic BP Percentile      Diastolic BP Percentile      Pulse Rate (!) 114     Resp 16     Temp 97.9 F (36.6 C)     Temp src      SpO2 100 %     Weight 141 lb 1.5 oz (64 kg)     Height 5\' 6"  (1.676 m)     Head Circumference      Peak Flow      Pain Score 0     Pain Loc      Pain Education      Exclude from Growth Chart     Most recent vital signs: Vitals:   05/11/23 1009 05/11/23 1340  BP: (!) 164/101 (!) 146/72  Pulse: (!) 114 83  Resp: 16 18  Temp: 97.9 F (36.6 C)   SpO2: 100% 100%    Constitutional: Alert and oriented. Eyes: Conjunctivae are normal. Head: Atraumatic. Nose: No congestion/rhinnorhea. Mouth/Throat: Mucous membranes are moist.  Cardiovascular: Normal rate, regular rhythm. Grossly normal heart sounds.  2+ radial pulses bilaterally. Respiratory: Normal respiratory effort.  No retractions. Lungs  CTAB. Gastrointestinal: Soft and nontender. No distention. Musculoskeletal: No lower extremity tenderness nor edema.  Neurologic:  Normal speech and language.  4 out of 5 strength in right lower extremity, 5 out of 5 strength in left lower extremity and bilateral upper extremities.  No facial droop noted.    ED Results / Procedures / Treatments   Labs (all labs ordered are listed, but only abnormal results are displayed) Labs Reviewed  CBC - Abnormal; Notable for the following components:      Result Value   Hemoglobin 11.8 (*)    All other components within normal limits  PROTIME-INR  APTT  DIFFERENTIAL  COMPREHENSIVE METABOLIC PANEL     EKG  ED ECG REPORT I, [name], the attending physician, personally viewed and interpreted this ECG.   Date: 05/11/2023  EKG Time: 10:12  Rate: 103  Rhythm: sinus tachycardia  Axis: Normal  Intervals:none  ST&T Change: None  RADIOLOGY CT head reviewed and interpreted by me with no hemorrhage or midline shift.  PROCEDURES:  Critical Care performed: No  Procedures   MEDICATIONS ORDERED IN ED: Medications  sodium chloride flush (NS) 0.9 % injection 3 mL (has no administration  in time range)  LORazepam (ATIVAN) injection 1 mg (has no administration in time range)     IMPRESSION / MDM / ASSESSMENT AND PLAN / ED COURSE  I reviewed the triage vital signs and the nursing notes.                              67 y.o. female with past medical history of hypertension, diabetes, CAD, stroke, and migraines who presents to the ED with right leg weakness since yesterday with some discomfort in the leg.  Patient's presentation is most consistent with acute presentation with potential threat to life or bodily function.  Differential diagnosis includes, but is not limited to, stroke, TIA, anemia, electrolyte abnormality, AKI, muscle cramping.  Patient well-appearing and in no acute distress, vital signs are unremarkable.  She seems  to have some weakness in her right lower extremity although pain may be a factor in this finding.  CT head is negative for acute process, does show evidence of old stroke and we will further assess with MRI to rule out acute stroke.  Labs without significant anemia, leukocytosis, electrolyte abnormality, or AKI.  LFTs and coags are also unremarkable.  Patient turned over to oncoming provider pending MRI results.      FINAL CLINICAL IMPRESSION(S) / ED DIAGNOSES   Final diagnoses:  Right leg weakness     Rx / DC Orders   ED Discharge Orders     None        Note:  This document was prepared using Dragon voice recognition software and may include unintentional dictation errors.   Sherry Noon, MD 05/11/23 1434

## 2023-05-11 NOTE — ED Notes (Signed)
Pt transported to CT ?

## 2023-05-12 ENCOUNTER — Inpatient Hospital Stay
Admit: 2023-05-12 | Discharge: 2023-05-12 | Disposition: A | Discharge status: Home / Self Care | Payer: Medicare (Managed Care) | Attending: Internal Medicine | Admitting: Internal Medicine

## 2023-05-12 DIAGNOSIS — I340 Nonrheumatic mitral (valve) insufficiency: Secondary | ICD-10-CM

## 2023-05-12 DIAGNOSIS — I63011 Cerebral infarction due to thrombosis of right vertebral artery: Secondary | ICD-10-CM | POA: Diagnosis not present

## 2023-05-12 DIAGNOSIS — I639 Cerebral infarction, unspecified: Secondary | ICD-10-CM

## 2023-05-12 LAB — LIPID PANEL
Cholesterol: 176 mg/dL (ref 0–200)
HDL: 48 mg/dL (ref >40)
LDL Cholesterol: 115 mg/dL — ABNORMAL HIGH (ref 0–99)
Total CHOL/HDL Ratio: 3.7 {ratio}
Triglycerides: 67 mg/dL (ref <150)
VLDL: 13 mg/dL (ref 0–40)

## 2023-05-12 LAB — ECHOCARDIOGRAM COMPLETE
AR max vel: 1.87 cm2
AV Peak grad: 5.5 mm[Hg]
Ao pk vel: 1.17 m/s
Area-P 1/2: 2.72 cm2
Height: 66.5 in
MV VTI: 0.95 cm2
S' Lateral: 3.1 cm
Weight: 2289.6 [oz_av]

## 2023-05-12 LAB — URINALYSIS, W/ REFLEX TO CULTURE (INFECTION SUSPECTED)
Bilirubin Urine: NEGATIVE
Glucose, UA: NEGATIVE mg/dL
Hgb urine dipstick: NEGATIVE
Ketones, ur: NEGATIVE mg/dL
Nitrite: NEGATIVE
Protein, ur: 30 mg/dL — AB
Specific Gravity, Urine: 1.034 — ABNORMAL HIGH (ref 1.005–1.030)
WBC, UA: >50 WBC/hpf (ref 0–5)
pH: 6 (ref 5.0–8.0)

## 2023-05-12 LAB — GLUCOSE, CAPILLARY
Glucose-Capillary: 66 mg/dL — ABNORMAL LOW (ref 70–99)
Glucose-Capillary: 68 mg/dL — ABNORMAL LOW (ref 70–99)
Glucose-Capillary: 77 mg/dL (ref 70–99)
Glucose-Capillary: 124 mg/dL — ABNORMAL HIGH (ref 70–99)
Glucose-Capillary: 138 mg/dL — ABNORMAL HIGH (ref 70–99)
Glucose-Capillary: 185 mg/dL — ABNORMAL HIGH (ref 70–99)
Glucose-Capillary: 170 mg/dL — ABNORMAL HIGH (ref 70–99)

## 2023-05-12 LAB — BASIC METABOLIC PANEL
Anion gap: 10 (ref 5–15)
BUN: 23 mg/dL (ref 8–23)
CO2: 24 mmol/L (ref 22–32)
Calcium: 10.2 mg/dL (ref 8.9–10.3)
Chloride: 108 mmol/L (ref 98–111)
Creatinine, Ser: 0.99 mg/dL (ref 0.44–1.00)
GFR, Estimated: >60 mL/min (ref >60)
Glucose, Bld: 72 mg/dL (ref 70–99)
Potassium: 3.6 mmol/L (ref 3.5–5.1)
Sodium: 142 mmol/L (ref 135–145)

## 2023-05-12 MED ORDER — TRAZODONE HCL 50 MG PO TABS: 50.0000 mg | ORAL_TABLET | Freq: Every evening | ORAL | Status: DC | PRN

## 2023-05-12 MED ORDER — INSULIN GLARGINE-YFGN 100 UNIT/ML ~~LOC~~ SOLN
10.0000 [IU] | Freq: Every day | SUBCUTANEOUS | Status: DC
  Administered 2023-05-12 – 2023-05-13 (×2): 10 [IU] via SUBCUTANEOUS
  Filled 2023-05-12 (×3): qty 0.1

## 2023-05-12 MED ORDER — ASPIRIN 81 MG PO TBEC
81.0000 mg | DELAYED_RELEASE_TABLET | Freq: Every day | ORAL | Status: DC
  Administered 2023-05-12 – 2023-05-14 (×3): 81 mg via ORAL
  Filled 2023-05-12 (×3): qty 1

## 2023-05-12 MED ORDER — LABETALOL HCL 5 MG/ML IV SOLN: 10.0000 mg | INTRAVENOUS | Status: DC | PRN

## 2023-05-12 MED ORDER — ATORVASTATIN CALCIUM 20 MG PO TABS
20.0000 mg | ORAL_TABLET | Freq: Every day | ORAL | Status: DC
  Administered 2023-05-12 – 2023-05-14 (×3): 20 mg via ORAL
  Filled 2023-05-12 (×3): qty 1

## 2023-05-12 MED ORDER — CLOPIDOGREL BISULFATE 75 MG PO TABS
75.0000 mg | ORAL_TABLET | Freq: Every day | ORAL | Status: DC
  Administered 2023-05-12 – 2023-05-14 (×3): 75 mg via ORAL
  Filled 2023-05-12 (×3): qty 1

## 2023-05-12 MED ORDER — SENNOSIDES-DOCUSATE SODIUM 8.6-50 MG PO TABS: 1.0000 | ORAL_TABLET | Freq: Every evening | ORAL | Status: DC | PRN

## 2023-05-12 NOTE — Progress Notes (Signed)
  Echocardiogram 2D Echocardiogram has been performed.  Lenor Coffin 05/12/2023, 11:54 AM

## 2023-05-12 NOTE — Progress Notes (Signed)
RN obtained blood sugar at 0459, 66. Patient asymptomatic. Patient was given orange juice and blood sugar rechecked 0530, blood sugar 68. Patient was given more orange juice and graham crackers, blood sugar rechecked at 0554, blood sugar 77. Patient asympotmatic

## 2023-05-12 NOTE — Consult Note (Signed)
NEUROLOGY CONSULT NOTE   Date of service: May 12, 2023 Patient Name: [name] MRN:  [id number] DOB:  1956-01-03 Chief Complaint: "Right sided weakness and incoordination" Requesting Provider: Miguel Rota, MD  History of Present Illness  [name] is a left handed 67 y.o. woman with past medical history significant for hypertension, hyperlipidemia, diabetes, coronary artery disease s/p stent placement, bilateral carotid stenosis s/p right CEA (2013), left CEA (2016), anxiety/depression, migraines, Jehovah's Witness refusing blood products, recent right hip surgery (approximately 2 months ago), prior strokes without significant residual deficits, vegan diet  At baseline she lives with her son and her husband typically prepares her pillbox is but she is otherwise independent (ambulating independently, managing some of her personal finances, dressing and bathing and feeding herself)  She was recovering well after her hip surgery, ambulating well.  On Wednesday she did feel like she noticed some increasing difficulty with her right leg but attributed this to overdoing things.  On Thursday she realized she was much weaker on the right side and had some incoordination of the right hand.  She presented to the ED for evaluation on Friday  She was recently started on Cipro for UTI although she reports she has a had a UTI for over a year  She repeatedly notes that she feels like all the same things are happening to her that happened to her mother before her mother passed away, and endorses some stressors at home with difficulties with her husband  Her husband is having some health problems as well and it was discovered on review of her medications personally by myself that she seems to not be taking her atorvastatin anymore  Of note after a prior stroke she did have a loop recorder placed 06/2019 it was last interrogated 08/28/2020 without evidence of atrial fibrillation and she reports  she was released from further monitoring although the device is still in place  SLP notes document the patient has been having hoarseness/hypophonic voice for 6 weeks.  Patient reports her husband has been telling her her voice is getting quieter and harder to hear, son at bedside reports some intermittent hoarseness but attributes that to sleepiness and reports that her voice does not sound significantly changed from baseline to him at the time of my evaluation  LKW: 11/13 Modified rankin score: 1-2 IV Thrombolysis: No out of the window EVT: No, exam not consistent with LVO  NIHSS 2  1 point for right leg drift and 1 point for right upper extremity ataxia out of proportion weakness   ROS  Comprehensive ROS performed and pertinent positives documented in HPI   Past History   Past Medical History:  Diagnosis Date   Anxiety    a. with claustrophobia   Arthritis    "right hand; maybe just starting in my left hand" (04/21/2012)   Asthma    CAD (coronary artery disease)    a. PCI [place] 2004. b. Angina - balloon angioplasty to LAD 04/21/12 (vessel too small for stent).   Carotid stenosis    a. 11/2011 critical R, ~30% L by angiogram;  b. 04/2012 s/p R CEA.   Cataract    left   Chronic lower back pain    Depression    a. occasionally takes st john's wart   Diabetes mellitus type 2, uncontrolled    Dysrhythmia    Embolic stroke (HCC)    a. 11/2011 MRI: mult small b/l infarcts c/w emboli;  b. 11/2011 MRA neck: high  grade RICA stenosis;  c. 6/20013 TEE: EF 55-60% with mild LVH, severe MAC, < 1 cm calc mobile structure on the MV annulus, ? 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 Thought was RICA stenosis did not explain the b/l small strokes; concern for emboli from mobile MV struct;  d. 01/2012 Event Mon:  no AFib   History of echocardiogram    Echo 12/16: mild LVH, EF 55-60%, no RWMA, Gr 2 DD, trivial AI, MAC, mild MR, mild LAE   History of kidney stones    History of  nuclear stress test    a. Myoview 12/16: EF 46%, no ischemia, Intermediate Risk (low EF)   HLD (hyperlipidemia)    a. 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 visual changes   Myocardial infarction [facility])    prior to 2013   Peripheral neuropathy    Refusal of blood transfusions as patient is Jehovah's Witness    Stroke (HCC) 2015- dec   mini stroke   Uncontrolled type 2 diabetes mellitus with circulatory disorder, with long-term current use of insulin 05/10/2015    Past Surgical History:  Procedure Laterality Date   ANKLE SURGERY  2005   after fall down stairs, left with screws and plate in place   CARDIAC CATHETERIZATION  2004   WNL, no significant coronary disease   CARDIAC CATHETERIZATION  12/19/11   CAROTID ANGIOGRAM Bilateral 12/19/2011   Procedure: CAROTID ANGIOGRAM;  Surgeon: Nada Libman, MD;  Location[facility] CATH LAB;  Service: Cardiovascular;  Laterality: Bilateral;   CAROTID ENDARTERECTOMY     CARPAL TUNNEL RELEASE  ~ 2005   bilateral   CATARACT EXTRACTION W/ INTRAOCULAR LENS IMPLANT  2012   right   CATARACT EXTRACTION W/PHACO Left 10/2015   Dr Noel Gerold at [facility]   CORONARY ANGIOPLASTY  04/21/2012   95% stenosis mid LAD, vessel not large enough for stent, rec plavix/ASA for 1 mo   CORONARY ANGIOPLASTY WITH STENT PLACEMENT  2002   x2   ENDARTERECTOMY  05/09/2012   Procedure: ENDARTERECTOMY CAROTID;  Surgeon: Nada Libman, MD;  [facility];  Service: Vascular;  Laterality: Right;  Right carotd endarterectomy with bovine patch angioplasty   ENDARTERECTOMY Left 06/23/2015   Procedure: ENDARTERECTOMY CAROTID;  Surgeon: Nada Libman, MD;  Location: [facility] OR;  Service: Vascular;  Laterality: Left;   EYE SURGERY Left 04/2015   laser surgery,blood vessels rupturing   holter monitor  2013   WNL   INTRAMEDULLARY (IM) NAIL INTERTROCHANTERIC Right 02/15/2023   Procedure: INTRAMEDULLARY NAILING RIGHT HIP;  Surgeon: Roby Lofts,  MD;  Location: [facility] OR;  Service: Orthopedics;  Laterality: Right;   KIDNEY STONE SURGERY     several times   LITHOTRIPSY     LOOP RECORDER INSERTION N/A 07/22/2019   Procedure: LOOP RECORDER INSERTION;  Surgeon: Marcina Millard, MD;  Location: [facility] INVASIVE CV LAB;  Service: Cardiovascular;  Laterality: N/A;   PARS PLANA VITRECTOMY Left 04/2015   [facility])   [facility] ANGIOPLASTY Left 06/23/2015   Procedure: PATCH ANGIOPLASTY USING 1cm x 4cm BOVINE PERICARDIAL PATCH;  Surgeon: Nada Libman, MD;  Location: [facility] OR;  Service: Vascular;  Laterality: Left;   PERCUTANEOUS CORONARY STENT INTERVENTION (PCI-S) N/A 04/21/2012   Procedure: PERCUTANEOUS CORONARY STENT INTERVENTION (PCI-S);  Surgeon: Kathleene Hazel, MD;  Location: [facility] CATH LAB;  Service: Cardiovascular;  Laterality: N/A;   TEE WITHOUT CARDIOVERSION  12/14/2011  Procedure: TRANSESOPHAGEAL ECHOCARDIOGRAM (TEE);  Surgeon: Laurey Morale, MD;  Location: [facility] ENDOSCOPY;  Service: Cardiovascular;  Laterality: N/A;   US ECHOCARDIOGRAPHY  2002   normal LV fxn, mild LVH, borderline L atrial size    Family History: Family History  Problem Relation Age of Onset   Heart disease Mother        valve problems   Stroke Mother    Hypertension Mother    Heart disease Sister        valve problems   Heart disease Brother    Diabetes Father    Hypertension Father    AAA (abdominal aortic aneurysm) Father    Cancer Neg Hx     Social History  reports that she quit smoking about 50 years ago. Her smoking use included cigarettes. She has never used smokeless tobacco. She reports that she does not drink alcohol and does not use drugs.  Allergies  Allergen Reactions   Other Shortness Of Breath    No blood or blood products   Penicillins Itching and Rash    Rash occurred ass a teenager -- took for strep throat.   Toradol [Ketorolac Tromethamine] Other (See Comments)    Panic attack    Imdur [Isosorbide Nitrate] Other (See  Comments)    Headaches    Latex Rash    Rash on all points of contact (thick, heavy itching rash)   Metformin And Related Diarrhea and Nausea And Vomiting    Medications   Current Facility-Administered Medications:    acetaminophen (TYLENOL) tablet 650 mg, 650 mg, Oral, Q4H PRN **OR** acetaminophen (TYLENOL) 160 MG/5ML solution 650 mg, 650 mg, Per Tube, Q4H PRN **OR** acetaminophen (TYLENOL) suppository 650 mg, 650 mg, Rectal, Q4H PRN, Lorretta Harp, MD   albuterol (PROVENTIL) (2.5 MG/3ML) 0.083% nebulizer solution 2.5 mg, 2.5 mg, Nebulization, Q4H PRN, Lorretta Harp, MD   aspirin EC tablet 81 mg, 81 mg, Oral, Daily, Lorretta Harp, MD, 81 mg at 05/12/23 0947   atorvastatin (LIPITOR) tablet 20 mg, 20 mg, Oral, Daily, Lorretta Harp, MD, 20 mg at 05/12/23 0947   cefTRIAXone (ROCEPHIN) 1 g in sodium chloride 0.9 % 100 mL IVPB, 1 g, Intravenous, Q24H, Lorretta Harp, MD, Stopped at 05/12/23 0023   clopidogrel (PLAVIX) tablet 75 mg, 75 mg, Oral, Daily, Lorretta Harp, MD, 75 mg at 05/12/23 0947   dextromethorphan-guaiFENesin (MUCINEX DM) 30-600 MG per 12 hr tablet 1 tablet, 1 tablet, Oral, BID PRN, Lorretta Harp, MD   enoxaparin (LOVENOX) injection 40 mg, 40 mg, Subcutaneous, Q24H, Lorretta Harp, MD, 40 mg at 05/11/23 2144   hydrALAZINE (APRESOLINE) injection 5 mg, 5 mg, Intravenous, Q2H PRN, Lorretta Harp, MD   insulin aspart (novoLOG) injection 0-5 Units, 0-5 Units, Subcutaneous, QHS, Lorretta Harp, MD   insulin aspart (novoLOG) injection 0-9 Units, 0-9 Units, Subcutaneous, TID WC, Lorretta Harp, MD, 1 Units at 05/12/23 1231   insulin glargine-yfgn (SEMGLEE) injection 10 Units, 10 Units, Subcutaneous, QHS, Lorretta Harp, MD   labetalol (NORMODYNE) injection 10 mg, 10 mg, Intravenous, Q2H PRN, Amin, Ankit C, MD   LORazepam (ATIVAN) injection 0.5 mg, 0.5 mg, Intravenous, Q4H PRN, Lorretta Harp, MD   meclizine (ANTIVERT) tablet 25 mg, 25 mg, Oral, TID PRN, Lorretta Harp, MD   ondansetron [facility]) injection 4 mg, 4 mg, Intravenous, Q8H PRN,  Lorretta Harp, MD   senna-docusate (Senokot-S) tablet 1 tablet, 1 tablet, Oral, QHS PRN, Amin, Ankit C, MD   sodium chloride flush (NS) 0.9 % injection 3 mL, 3 mL, Intravenous, Once,  Chesley Noon, MD   traZODone (DESYREL) tablet 50 mg, 50 mg, Oral, QHS PRN, Amin, Ankit C, MD  Current Outpatient Medications  Medication Instructions   aspirin EC 81 mg, Oral, Daily   atorvastatin (LIPITOR) 40 mg, Oral, Daily-1800   ciprofloxacin (CIPRO) 500 mg, Oral, 2 times daily   clopidogrel (PLAVIX) 75 mg, Oral, Daily   losartan (COZAAR) 100 mg, Oral, Daily   meclizine (ANTIVERT) 12.5 mg, Oral, 3 times daily PRN   Tresiba FlexTouch 20 Units, Subcutaneous, Nightly     Vitals   Vitals:   05/12/23 0829 05/12/23 1002 05/12/23 1028 05/12/23 1224  BP: 104/64 (!) 161/64 (!) 158/62 (!) 156/70  Pulse: 85 83 82 89  Resp: 16 18 18 18   Temp: 98 F (36.7 C) 97.7 F (36.5 C) 97.8 F (36.6 C) 97.8 F (36.6 C)  TempSrc:      SpO2: 99% 100% 100% 100%  Weight:      Height:        Body mass index is 22.75 kg/m.  Physical Exam   Constitutional: Appears frail Psych: Affect flat but cooperative Eyes: No scleral injection.  HENT: No OP obstruction.  Head: Normocephalic.  Cardiovascular: Normal rate and regular rhythm.  Respiratory: Effort normal, non-labored breathing.  GI: Soft.  No distension. There is no tenderness.  Skin: WDI.   Neurologic Examination   Physical Exam  Constitutional: Appears well-developed and well-nourished.  Psych: Affect appropriate to situation Eyes: No scleral injection HENT: No OP obstrucion MSK: no joint deformities.  Cardiovascular: Normal rate and regular rhythm.  Respiratory: Effort normal, non-labored breathing GI: Soft.  No distension. There is no tenderness.  Skin: WDI  Neuro: Mental Status: Patient is awake, alert, oriented to person, place, month, year, and situation. At times poor attention/concentration versus just being slow to respond Patient is able  to give a clear and coherent history. No signs of aphasia or neglect Cranial Nerves: II: Visual Fields are full. Pupils are equal, round, and reactive to light.   III,IV, VI: EOMI without ptosis or diploplia.  V: Facial sensation is symmetric to temperature VII: Facial movement is symmetric.  VIII: hearing is intact to voice X: Uvula elevates symmetrically XI: Shoulder shrug is symmetric. XII: tongue is midline without atrophy or fasciculations.  Motor: 5/5 strength was present throughout the upper extremities.   Right hip flexion 3/5, knee extension 4+, knee flexion 4 -, foot dorsiflexion 5, foot plantarflexion 4  Left hip flexion 4/5, otherwise 5/5 Sensory: Sensation is symmetric to light touch in all 4 extremities Deep Tendon Reflexes: 2+ and symmetric in the biceps and patellae.  Cerebellar: Finger-nose ataxic on the right, intact on the left and intact within limits of weakness in bilateral legs Gait: Using a walker and assistance she stands up, requires significant instruction to be able to turn around and sit down on the bed appropriately   Labs/Imaging/Neurodiagnostic studies   CBC:  Recent Labs  Lab 05-28-23 1011  WBC 4.2  NEUTROABS 1.9  HGB 11.8*  HCT 36.2  MCV 87.2  PLT 310   Basic Metabolic Panel:  Lab Results  Component Value Date   NA 142 05/12/2023   K 3.6 05/12/2023   CO2 24 05/12/2023   GLUCOSE 72 05/12/2023   BUN 23 05/12/2023   CREATININE 0.99 05/12/2023   CALCIUM 10.2 05/12/2023   GFRNONAA >60 05/12/2023   GFRAA >60 03/17/2019   Lipid Panel:  Lab Results  Component Value Date   LDLCALC 115 (H) 05/12/2023  HgbA1c:  Lab Results  Component Value Date   HGBA1C 6.8 (H) 05/08/2023   Urine Drug Screen:     Component Value Date/Time   LABOPIA NONE DETECTED 12/12/2011 0936   COCAINSCRNUR NONE DETECTED 12/12/2011 0936   LABBENZ NONE DETECTED 12/12/2011 0936   AMPHETMU NONE DETECTED 12/12/2011 0936   THCU NONE DETECTED 12/12/2011 0936    LABBARB NONE DETECTED 12/12/2011 0936    Alcohol Level No results found for: "ETH" INR  Lab Results  Component Value Date   INR 1.1 05/11/2023   APTT  Lab Results  Component Value Date   APTT 32 05/11/2023    CT angio Head and Neck with contrast(Personally reviewed): 1. Negative CTA for large vessel occlusion or other emergent finding. 2. Sequelae of prior bilateral carotid endarterectomies without significant residual or recurrent stenosis. 3. Atheromatous change about the carotid siphons with resultant severe stenosis at the supraclinoid right ICA, and more moderate stenosis about the contralateral supraclinoid left ICA. 4. Focal severe distal right P2 stenosis. 5. Atheromatous change at the origins of both vertebral arteries with moderate stenosis on the right. Small penetrating plaque at the origin of the left vertebral artery. 6. Diffuse tortuosity of the major arterial vasculature of the head and neck, suggesting chronic underlying hypertension.    MRI Brain(Personally reviewed):  IMPRESSION: 1. Acute infarct in the left pons. 2. Remote infarcts and chronic microvascular ischemic change.   ASSESSMENT  Given progressive stroke symptoms over the course of 1 to 2 days, likely a stuttering lacunar stroke rather than an embolic event secondary to small vessel disease from her chronic stroke risk factors.  She is chronically on dual antiplatelet therapy and I favor continuing this.  It appears that she has stopped taking her atorvastatin recently, inadvertently possibly due to medication error from husband who is managing her medications.  Son is planning to take care of medications going forward.  A1c is meeting goal.   RECOMMENDATIONS  # Left pontine small vessel disease stroke - Echocardiogram - Continue home DAPT; could consider replacing Plavix with Ticagrelor pending cost estimate and if patient felt to be able to reliably take medications twice daily noting she only  had ciprofloxacin, losartan and meclizine in her bag as her current meds and seemed quite confused about her regimen - Risk factor modification - Telemetry monitoring; interrogate loop recorder if possible given it may be out of battery - Blood pressure goal Normotension as she is out of the permissive hypertension window - PT consult, OT consult, Speech consult appreciated - Neurology will follow-up echocardiogram and loop recorder interrogation if available, otherwise we will sign off.  Please reach out with any questions or concerns ______________________________________________________________________    Signed, Gordy Councilman, MD [facility] Coverage for [facility] is from 8 AM to 4 AM in-house and 4 PM to 8 PM by telephone/video. 8 PM to 8 AM emergent questions or overnight urgent questions should be addressed to Teleneurology On-call or [facility] neurohospitalist; contact information can be found on AMION

## 2023-05-12 NOTE — Progress Notes (Signed)
OT Cancellation Note  Patient Details Name: [name] MRN: [id number] DOB: 11-21-1955   Cancelled Treatment:    Reason Eval/Treat Not Completed: Patient at procedure or test/ unavailable. Consult received, chart reviewed. Staff preparing pt for bedside echocardiogram. Unavailable for OT evaluation. Will re-attempt at later date/time as pt is available and medically appropriate.   Arman Filter., MPH, MS, OTR/L ascom [phone] 05/12/23, 11:28 AM

## 2023-05-12 NOTE — Evaluation (Signed)
Speech Language Pathology Evaluation Patient Details Name: [name] MRN: [id number] DOB: January 29, 1956 Today's Date: 05/12/2023 Time: 8416-6063 SLP Time Calculation (min) (ACUTE ONLY): 33 min  Problem List:  Patient Active Problem List   Diagnosis Date Noted   Diabetes mellitus without complication (HCC) 05/11/2023   Depression with anxiety 05/11/2023   Encounter to establish care 05/08/2023   Hip fracture (HCC) 02/15/2023   HTN (hypertension) 02/15/2023   UTI (urinary tract infection) 02/15/2023   Status post placement of implantable loop recorder 07/29/2019   Bilateral carotid artery stenosis 11/20/2017   S/P PTCA (percutaneous transluminal coronary angioplasty) 11/11/2017   Stroke [facility])    Refusal of blood transfusions as patient is Jehovah's Witness    Peripheral neuropathy    Myocardial infarction (HCC)    Migraine aura without headache    HLD (hyperlipidemia)    History of nuclear stress test    History of kidney stones    History of echocardiogram    Diabetes mellitus type 2, uncontrolled    Depression    Cataract    Chronic lower back pain    Asthma    Arthritis    Anxiety    Carotid stenosis, asymptomatic 06/23/2015   Mitral valve annular calcification 05/11/2015   DM (diabetes mellitus), type 2 (HCC) 05/10/2015   Proliferative diabetic retinopathy (HCC) 12/17/2013   Breast mass, left 01/10/2012   Carotid stenosis 12/30/2011   Embolic stroke (HCC) 12/14/2011   Hypertension, accelerated 12/12/2011   CAD (coronary artery disease) 12/12/2011   Past Medical History:  Past Medical History:  Diagnosis Date   Anxiety    a. with claustrophobia   Arthritis    "right hand; maybe just starting in my left hand" (04/21/2012)   Asthma    CAD (coronary artery disease)    a. PCI [place] 2004. b. Angina - balloon angioplasty to LAD 04/21/12 (vessel too small for stent).   Carotid stenosis    a. 11/2011 critical R, ~30% L by angiogram;  b. 04/2012 s/p R CEA.    Cataract    left   Chronic lower back pain    Depression    a. occasionally takes [facility] john's wart   Diabetes mellitus type 2, uncontrolled    Dysrhythmia    Embolic stroke (HCC)    a. 11/2011 MRI: mult small b/l infarcts c/w emboli;  b. 11/2011 MRA neck: high grade RICA stenosis;  c. 6/20013 TEE: EF 55-60% with mild LVH, severe MAC, < 1 cm calc mobile structure on the MV annulus, ? 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 Thought was RICA stenosis did not explain the b/l small strokes; concern for emboli from mobile MV struct;  d. 01/2012 Event Mon:  no AFib   History of echocardiogram    Echo 12/16: mild LVH, EF 55-60%, no RWMA, Gr 2 DD, trivial AI, MAC, mild MR, mild LAE   History of kidney stones    History of nuclear stress test    a. Myoview 12/16: EF 46%, no ischemia, Intermediate Risk (low EF)   HLD (hyperlipidemia)    a. 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 visual changes   Myocardial infarction [facility])    prior to 2013   Peripheral neuropathy    Refusal of blood transfusions as patient is Jehovah's Witness    Stroke (HCC) 2015- dec   mini stroke   Uncontrolled type 2 diabetes mellitus with circulatory disorder,  with long-term current use of insulin 05/10/2015   Past Surgical History:  Past Surgical History:  Procedure Laterality Date   ANKLE SURGERY  2005   after fall down stairs, left with screws and plate in place   CARDIAC CATHETERIZATION  2004   WNL, no significant coronary disease   CARDIAC CATHETERIZATION  12/19/11   CAROTID ANGIOGRAM Bilateral 12/19/2011   Procedure: CAROTID ANGIOGRAM;  Surgeon: Nada Libman, MD;  Location: [facility] CATH LAB;  Service: Cardiovascular;  Laterality: Bilateral;   CAROTID ENDARTERECTOMY     CARPAL TUNNEL RELEASE  ~ 2005   bilateral   CATARACT EXTRACTION W/ INTRAOCULAR LENS IMPLANT  2012   right   CATARACT EXTRACTION W/PHACO Left 10/2015   Dr Noel Gerold at [facility]   CORONARY  ANGIOPLASTY  04/21/2012   95% stenosis mid LAD, vessel not large enough for stent, rec plavix/ASA for 1 mo   CORONARY ANGIOPLASTY WITH STENT PLACEMENT  2002   x2   ENDARTERECTOMY  05/09/2012   Procedure: ENDARTERECTOMY CAROTID;  Surgeon: Nada Libman, MD;  Location: [facility] OR;  Service: Vascular;  Laterality: Right;  Right carotd endarterectomy with bovine patch angioplasty   ENDARTERECTOMY Left 06/23/2015   Procedure: ENDARTERECTOMY CAROTID;  Surgeon: Nada Libman, MD;  Location: [facility] OR;  Service: Vascular;  Laterality: Left;   EYE SURGERY Left 04/2015   laser surgery,blood vessels rupturing   holter monitor  2013   WNL   INTRAMEDULLARY (IM) NAIL INTERTROCHANTERIC Right 02/15/2023   Procedure: INTRAMEDULLARY NAILING RIGHT HIP;  Surgeon: Roby Lofts, MD;  Location: [facility] OR;  Service: Orthopedics;  Laterality: Right;   KIDNEY STONE SURGERY     several times   LITHOTRIPSY     LOOP RECORDER INSERTION N/A 07/22/2019   Procedure: LOOP RECORDER INSERTION;  Surgeon: Marcina Millard, MD;  Location: [facility] INVASIVE CV LAB;  Service: Cardiovascular;  Laterality: N/A;   PARS PLANA VITRECTOMY Left 04/2015   [facility])   [facility] ANGIOPLASTY Left 06/23/2015   Procedure: PATCH ANGIOPLASTY USING 1cm x 4cm BOVINE PERICARDIAL PATCH;  Surgeon: Nada Libman, MD;  Location: [facility] OR;  Service: Vascular;  Laterality: Left;   PERCUTANEOUS CORONARY STENT INTERVENTION (PCI-S) N/A 04/21/2012   Procedure: PERCUTANEOUS CORONARY STENT INTERVENTION (PCI-S);  Surgeon: Kathleene Hazel, MD;  Location: [facility] CATH LAB;  Service: Cardiovascular;  Laterality: N/A;   TEE WITHOUT CARDIOVERSION  12/14/2011   Procedure: TRANSESOPHAGEAL ECHOCARDIOGRAM (TEE);  Surgeon: Laurey Morale, MD;  Location: [facility] ENDOSCOPY;  Service: Cardiovascular;  Laterality: N/A;   US ECHOCARDIOGRAPHY  2002   normal LV fxn, mild LVH, borderline L atrial size   HPI:  Pt is 67 yo with PMHx including recent fall s/p IM nailing on 02/15/23,  anxiety, arthritis, Asthma, CADF, Carotid stenosis s/p endarterectomy, cataract, LBP, depression, DM II, Dysrhythmia, embolic stroke, HLD, HTN, MI< peripheral neuropathy who presents with right-sided weakness.  MRI+ for acute infarct of the left pons.   Assessment / Plan / Recommendation Clinical Impression  Pt seen for cognitive-linguistic evaluation. Pt demonstrated intact basic functional cognitive-linguistic ability. Of note, pt with c/o hoarseness/hypophonia for ~6 weeks. Appreciated by SLP this date. No functional effect on speech intelligiblity. Recommend consideration for ENT consult if hoarseness persists with consideration for OP SLP services for voice eval/tx down the line. SLP to sign off as pt has no acute SLP needs.    SLP Assessment  SLP Recommendation/Assessment: All further Speech Lanaguage Pathology  needs can be addressed in the next venue of care (  for voice; pending ENT consult) SLP Visit Diagnosis:  (dysphonia)    Recommendations for follow up therapy are one component of a multi-disciplinary discharge planning process, led by the attending physician.  Recommendations may be updated based on patient status, additional functional criteria and insurance authorization.    Follow Up Recommendations  Follow physician's recommendations for discharge plan and follow up therapies    Assistance Recommended at Discharge   (defer to OT/PT)  Functional Status Assessment Patient has not had a recent decline in their functional status (for cognitive-linguistic functioning)        SLP Evaluation Cognition  Overall Cognitive Status: Within Functional Limits for tasks assessed Orientation Level: Oriented X4       Comprehension  Auditory Comprehension Overall Auditory Comprehension: Appears within functional limits for tasks assessed    Expression Expression Primary Mode of Expression: Verbal Verbal Expression Overall Verbal Expression: Appears within functional limits for tasks  assessed   Oral / Motor  Oral Motor/Sensory Function Overall Oral Motor/Sensory Function: Within functional limits Motor Speech Overall Motor Speech: Impaired Respiration: Within functional limits Phonation: Hoarse;Low vocal intensity Resonance: Within functional limits Articulation: Within functional limitis Intelligibility: Intelligible           Clyde Canterbury, M.S., CCC-SLP Speech-Language Pathologist [facility] [phone] (ASCOM)  Woodroe Chen 05/12/2023, 11:26 AM

## 2023-05-12 NOTE — Progress Notes (Signed)
PHARMACY CONSULT NOTE  Pharmacy Consult for Electrolyte Monitoring and Replacement   Recent Labs: Potassium (mmol/L)  Date Value  05/12/2023 3.6  05/24/2014 3.9   Magnesium (mg/dL)  Date Value  84/13/2440 1.7   Calcium (mg/dL)  Date Value  04/21/2535 10.2   Calcium, Total (mg/dL)  Date Value  64/40/3474 8.7   Albumin (g/dL)  Date Value  25/95/6387 4.2  05/08/2023 4.3  05/24/2014 3.4   Phosphorus (mg/dL)  Date Value  56/43/3295 2.1 (L)   Sodium (mmol/L)  Date Value  05/12/2023 142  05/08/2023 141  05/24/2014 140     Assessment:  67 y.o. female with medical history significant of stroke, carotid artery stenosis (s/p of endarterectomy), HTN, HLD, CAD, DM, dCHF, depression with anxiety, migraine headaches, kidney stone, Jehovah witness, who presents with right-sided weakness.   Goal of Therapy:  Electrolytes WNL  Plan:  ---no electrolyte replacement warranted for today ---recheck electrolytes in am  Lowella Bandy ,PharmD Clinical Pharmacist 05/12/2023 8:06 AM

## 2023-05-12 NOTE — Evaluation (Signed)
Physical Therapy Evaluation Patient Details Name: [name] MRN: [id number] DOB: 12/21/1955 Today's Date: 05/12/2023  History of Present Illness  Pt is a 67 y/o F admitted on 05/11/23 after presenting with R sided weakness. Brain MRI showed acute infarct in the L pons. PMH: stroke, carotid artery stenosis s/p endarterectomy, HTN, HLD, CAD, DM, dCHF, depression with anxiety, migraine HA, kidney stones, Jehovah witness, R intertrochanteric femur fx s/p IM nail 02/15/23, peripheral neuropathy, MI  Clinical Impression  Pt seen for PT evaluation with pt agreeable, co-tx with OT for increased pt safety. Pt reports she was recovering well from R IM nail, ambulating PRN with SPC. Pt reports she lives with many family members that can provide assistance at d/c. On this date, pt presents with R sided weakness but is motivated to participate & able to progress to ambulating with BUE HHA min assist with gait pattern as noted below. Pt is an excellent candidate for intensive inpatient follow up therapy, >3 hours/day, to maximize independence with mobility & reports she has great support at d/c. Will continue to follow pt acutely to address R NMR, balance, strength, endurance, to increase independence with mobility & reduce fall risk.      If plan is discharge home, recommend the following: A lot of help with walking and/or transfers;A lot of help with bathing/dressing/bathroom;Assist for transportation;Assistance with cooking/housework;Help with stairs or ramp for entrance   Can travel by private vehicle        Equipment Recommendations Other (comment) (defer to next venue)  Recommendations for Other Services       Functional Status Assessment Patient has had a recent decline in their functional status and demonstrates the ability to make significant improvements in function in a reasonable and predictable amount of time.     Precautions / Restrictions Precautions Precautions: Fall Precaution  Comments: R hemi Restrictions Weight Bearing Restrictions: No      Mobility  Bed Mobility Overal bed mobility: Needs Assistance Bed Mobility: Supine to Sit     Supine to sit: Used rails, HOB elevated, Supervision (to exit L side of bed)          Transfers Overall transfer level: Needs assistance Equipment used: 1 person hand held assist Transfers: Bed to chair/wheelchair/BSC     Step pivot transfers: Min assist (bed>recliner on L with SPC, RUE HHA with min<>mod assist for balance)       General transfer comment: STS from EOB with RUE support min assist    Ambulation/Gait Ambulation/Gait assistance: Min assist, +2 physical assistance Gait Distance (Feet): 65 Feet Assistive device: 2 person hand held assist Gait Pattern/deviations: Decreased step length - right, Decreased step length - left, Decreased stride length, Decreased dorsiflexion - right Gait velocity: decreased     General Gait Details: BUE HHA min assist +2, pt able to advance RLE without assistance, no buckling noted  Stairs            Wheelchair Mobility     Tilt Bed    Modified Rankin (Stroke Patients Only)       Balance Overall balance assessment: Needs assistance Sitting-balance support: Feet supported Sitting balance-[name] Scale: Good     Standing balance support: During functional activity, Bilateral upper extremity supported Standing balance-[name] Scale: Poor                               Pertinent Vitals/Pain Pain Assessment Pain Assessment: No/denies pain  Home Living Family/patient expects to be discharged to:: Private residence Living Arrangements: Spouse/significant other;Children Available Help at Discharge: Family;Available 24 hours/day Type of Home: House Home Access: Stairs to enter Entrance Stairs-Rails: None Entrance Stairs-Number of Steps: 1 step down to mud room then 1 step up Alternate Level Stairs-Number of Steps: 13 Home Layout: Two  level;Able to live on main level with bedroom/bathroom Home Equipment: Shower seat - built in;Grab bars - tub/shower;Cane - single Librarian, academic (2 wheels)      Prior Function               Mobility Comments: Pt reports she had progressed to ambulating PRN with SPC.       Extremity/Trunk Assessment   Upper Extremity Assessment Upper Extremity Assessment: Left hand dominant;RUE deficits/detail RUE Deficits / Details: Pt able to perform finger opposition but with increased time    Lower Extremity Assessment Lower Extremity Assessment: RLE deficits/detail RLE Deficits / Details: 1/5 hip flexion in sitting, 3-/5 knee extension, 2-/5 dorsiflexion; sensation (to light touch) intact, proprioception intact    Cervical / Trunk Assessment Cervical / Trunk Assessment: Normal  Communication   Communication Communication: No apparent difficulties  Cognition Arousal: Alert Behavior During Therapy: WFL for tasks assessed/performed Overall Cognitive Status: Within Functional Limits for tasks assessed                                          General Comments      Exercises     Assessment/Plan    PT Assessment Patient needs continued PT services  PT Problem List Decreased coordination;Decreased strength;Decreased range of motion;Decreased activity tolerance;Decreased balance;Decreased mobility;Decreased knowledge of use of DME       PT Treatment Interventions Balance training;Modalities;DME instruction;Gait training;Neuromuscular re-education;Stair training;Cognitive remediation;Functional mobility training;Therapeutic activities;Patient/family education;Therapeutic exercise;Manual techniques    PT Goals (Current goals can be found in the Care Plan section)  Acute Rehab PT Goals Patient Stated Goal: get better PT Goal Formulation: With patient Time For Goal Achievement: 05/26/23 Potential to Achieve Goals: Good    Frequency Min 1X/week      Co-evaluation PT/OT/SLP Co-Evaluation/Treatment: Yes Reason for Co-Treatment: For patient/therapist safety;To address functional/ADL transfers PT goals addressed during session: Mobility/safety with mobility;Balance         AM-PAC PT "6 Clicks" Mobility  Outcome Measure Help needed turning from your back to your side while in a flat bed without using bedrails?: None Help needed moving from lying on your back to sitting on the side of a flat bed without using bedrails?: A Little Help needed moving to and from a bed to a chair (including a wheelchair)?: A Little Help needed standing up from a chair using your arms (e.g., wheelchair or bedside chair)?: A Lot Help needed to walk in hospital room?: A Lot Help needed climbing 3-5 steps with a railing? : A Lot 6 Click Score: 16    End of Session Equipment Utilized During Treatment: Gait belt Activity Tolerance: Patient tolerated treatment well Patient left: in chair;with chair alarm set;with call bell/phone within reach Nurse Communication: Mobility status PT Visit Diagnosis: Muscle weakness (generalized) (M62.81);Hemiplegia and hemiparesis;Unsteadiness on feet (R26.81);Difficulty in walking, not elsewhere classified (R26.2);Other abnormalities of gait and mobility (R26.89) Hemiplegia - Right/Left: Right Hemiplegia - dominant/non-dominant: Non-dominant Hemiplegia - caused by: Cerebral infarction    Time: 1210-1232 PT Time Calculation (min) (ACUTE ONLY): 22 min   Charges:  PT Evaluation $PT Eval Low Complexity: 1 Low   PT General Charges $$ ACUTE PT VISIT: 1 Visit         Aleda Grana, PT, DPT 05/12/23, 1:08 PM   Sandi Mariscal 05/12/2023, 1:04 PM

## 2023-05-12 NOTE — Hospital Course (Addendum)
  Brief Narrative:  67 year old with history of CVA, renal artery stenosis status post endarterectomy, HTN, HLD, diastolic CHF, DM2, depression, migraines, Jehovah's Witness comes with right-sided weakness.  MRI showed acute left pontine infarct with other chronic infarcts.  Neurology was consulted and patient was admitted.   Assessment & Plan:  Principal Problem:   Stroke [facility]) Active Problems:   UTI (urinary tract infection)   Carotid stenosis   CAD (coronary artery disease)   HLD (hyperlipidemia)   HTN (hypertension)   Asthma   Diabetes mellitus without complication (HCC)     Right-sided weakness secondary to acute left pon CVA -Currently on aspirin, statin and Plavix.  Recent A1c 6.8, LDL 115.  CTA head and neck is negative for LVO.  Routine CVA workup including echocardiogram, neurology consult, PT/OT, speech and swallow Wonder if she would benefit from outpatient cardiac monitoring and also add full dose anticoagulation   UTI (urinary tract infection), POA Prior to admission diagnosed with UTI on Cipro at home, currently on IV Rocephin.  Complete total 3-day course.   Carotid artery stenosis Coronary artery disease Continue aspirin Plavix and statin   HLD (hyperlipidemia) Lipitor   HTN (hypertension) Permissive hypertension   Asthma: Stable -As needed bronchodilators   Diabetes mellitus without complication (HCC): Recent A1c 6.8, currently on Semglee, sliding scale and Accu-Cheks   Anxiety -As needed Ativan    DVT prophylaxis: Lovenox Code Status: Full code Family Communication:   Status is: Inpatient Remains inpatient appropriate because: Ongoing CVA workup    Subjective: Seen at bedside, still having right-sided weakness.   Examination:  General exam: Appears calm and comfortable  Respiratory system: Clear to auscultation. Respiratory effort normal. Cardiovascular system: S1 & S2 heard, RRR. No JVD, murmurs, rubs, gallops or clicks. No pedal  edema. Gastrointestinal system: Abdomen is nondistended, soft and nontender. No organomegaly or masses felt. Normal bowel sounds heard. Central nervous system: Alert and oriented. No focal neurological deficits. Extremities: Right-sided strength 4/5.  Left upper and lower extremity strength 5/5 Skin: No rashes, lesions or ulcers Psychiatry: Judgement and insight appear normal. Mood & affect appropriate.

## 2023-05-12 NOTE — Plan of Care (Signed)
  Problem: Education: Goal: Knowledge of disease or condition will improve Outcome: Progressing Goal: Knowledge of secondary prevention will improve (MUST DOCUMENT ALL) Outcome: Progressing Goal: Knowledge of patient specific risk factors will improve Loraine Leriche N/A or DELETE if not current risk factor) Outcome: Progressing   Problem: Ischemic Stroke/TIA Tissue Perfusion: Goal: Complications of ischemic stroke/TIA will be minimized Outcome: Progressing   Problem: Coping: Goal: Will verbalize positive feelings about self Outcome: Progressing Goal: Will identify appropriate support needs Outcome: Progressing   Problem: Health Behavior/Discharge Planning: Goal: Ability to manage health-related needs will improve Outcome: Progressing Goal: Goals will be collaboratively established with patient/family Outcome: Progressing   Problem: Self-Care: Goal: Ability to participate in self-care as condition permits will improve Outcome: Progressing Goal: Verbalization of feelings and concerns over difficulty with self-care will improve Outcome: Progressing Goal: Ability to communicate needs accurately will improve Outcome: Progressing   Problem: Nutrition: Goal: Risk of aspiration will decrease Outcome: Progressing Goal: Dietary intake will improve Outcome: Progressing   Problem: Education: Goal: Ability to describe self-care measures that may prevent or decrease complications (Diabetes Survival Skills Education) will improve Outcome: Progressing Goal: Individualized Educational Video(s) Outcome: Progressing   Problem: Coping: Goal: Ability to adjust to condition or change in health will improve Outcome: Progressing   Problem: Fluid Volume: Goal: Ability to maintain a balanced intake and output will improve Outcome: Progressing   Problem: Health Behavior/Discharge Planning: Goal: Ability to identify and utilize available resources and services will improve Outcome:  Progressing Goal: Ability to manage health-related needs will improve Outcome: Progressing   Problem: Metabolic: Goal: Ability to maintain appropriate glucose levels will improve Outcome: Progressing   Problem: Nutritional: Goal: Maintenance of adequate nutrition will improve Outcome: Progressing Goal: Progress toward achieving an optimal weight will improve Outcome: Progressing   Problem: Skin Integrity: Goal: Risk for impaired skin integrity will decrease Outcome: Progressing   Problem: Tissue Perfusion: Goal: Adequacy of tissue perfusion will improve Outcome: Progressing   Problem: Education: Goal: Knowledge of General Education information will improve Description: Including pain rating scale, medication(s)/side effects and non-pharmacologic comfort measures Outcome: Progressing   Problem: Health Behavior/Discharge Planning: Goal: Ability to manage health-related needs will improve Outcome: Progressing   Problem: Clinical Measurements: Goal: Ability to maintain clinical measurements within normal limits will improve Outcome: Progressing Goal: Will remain free from infection Outcome: Progressing Goal: Diagnostic test results will improve Outcome: Progressing Goal: Respiratory complications will improve Outcome: Progressing Goal: Cardiovascular complication will be avoided Outcome: Progressing   Problem: Activity: Goal: Risk for activity intolerance will decrease Outcome: Progressing   Problem: Nutrition: Goal: Adequate nutrition will be maintained Outcome: Progressing   Problem: Coping: Goal: Level of anxiety will decrease Outcome: Progressing   Problem: Elimination: Goal: Will not experience complications related to bowel motility Outcome: Progressing Goal: Will not experience complications related to urinary retention Outcome: Progressing   Problem: Pain Management: Goal: General experience of comfort will improve Outcome: Progressing   Problem:  Safety: Goal: Ability to remain free from injury will improve Outcome: Progressing   Problem: Skin Integrity: Goal: Risk for impaired skin integrity will decrease Outcome: Progressing

## 2023-05-12 NOTE — Progress Notes (Signed)
Inpatient Rehab Admissions Coordinator:  Consult received. Pt's insurance is out of network for CIR. Other rehab venues should be pursued. TOC made aware.    Wolfgang Phoenix, MS, CCC-SLP Admissions Coordinator [phone]

## 2023-05-12 NOTE — Evaluation (Signed)
Occupational Therapy Evaluation Patient Details Name: [name] MRN: [id number] DOB: 1955/10/20 Today's Date: 05/12/2023   History of Present Illness Pt is a 67 y/o F admitted on 05/11/23 after presenting with R sided weakness. Brain MRI showed acute infarct in the L pons. PMH: stroke, carotid artery stenosis s/p endarterectomy, HTN, HLD, CAD, DM, dCHF, depression with anxiety, migraine HA, kidney stones, Jehovah witness, R intertrochanteric femur fx s/p IM nail 02/15/23, peripheral neuropathy, MI   Clinical Impression   Pt seen for OT evaluation and co-tx with PT to optimize ADL/mobility training. Pt pleasant, motivated and demonstrating good awareness of deficits and safety during session. She notes having moved in with her son in law and daughter with her spouse and living on main level of the home. She notes her recovery from her R IMN in August has been going well and she had returned to independence with ADL and PRN use of SPC. Pt presents with R side weakness and decreased FMC impairing her functional independence with ADL and mobility. Pt required MIN A +2 handheld assist for mobility this date and simulated MOD A LB dressing involving STS transfers. Pt is an excellent candidate for intensive inpatient follow up therapy, >3 hours/day, to maximize independence with mobility & reports she has great support at d/c. Will continue to follow pt acutely to address R NMR, balance, strength, endurance, to increase independence with mobility & reduce fall risk.     If plan is discharge home, recommend the following: A little help with walking and/or transfers;A little help with bathing/dressing/bathroom;Assistance with cooking/housework;Assist for transportation;Help with stairs or ramp for entrance    Functional Status Assessment  Patient has had a recent decline in their functional status and demonstrates the ability to make significant improvements in function in a reasonable and predictable  amount of time.  Equipment Recommendations  Other (comment) (defer to next venue pending progress)    Recommendations for Other Services Rehab consult     Precautions / Restrictions Precautions Precautions: Fall Precaution Comments: R hemi Restrictions Weight Bearing Restrictions: No      Mobility Bed Mobility Overal bed mobility: Needs Assistance Bed Mobility: Supine to Sit     Supine to sit: Used rails, HOB elevated, Supervision          Transfers Overall transfer level: Needs assistance Equipment used: 1 person hand held assist Transfers: Bed to chair/wheelchair/BSC       Step pivot transfers: Min assist (bed>recliner on L with SPC, RUE HHA with min<>mod assist for balance)     General transfer comment: STS from EOB with RUE support min assist      Balance Overall balance assessment: Needs assistance Sitting-balance support: Feet supported Sitting balance-[name] Scale: Good     Standing balance support: During functional activity, Bilateral upper extremity supported Standing balance-[name] Scale: Poor                             ADL either performed or assessed with clinical judgement   ADL Overall ADL's : Needs assistance/impaired                     Lower Body Dressing: Sit to/from stand;Moderate assistance Lower Body Dressing Details (indicate cue type and reason): simulated         Tub/ Shower Transfer: Contact guard assist   Functional mobility during ADLs: Minimal assistance;Rolling walker (2 wheels);+2 for physical assistance  Vision         Perception         Praxis         Pertinent Vitals/Pain Pain Assessment Pain Assessment: No/denies pain     Extremity/Trunk Assessment Upper Extremity Assessment Upper Extremity Assessment: Left hand dominant;RUE deficits/detail RUE Deficits / Details: Pt able to perform finger opposition but with increased time, mild FMC deficits, but overall 4-/5 strength RUE  Sensation: WNL RUE Coordination: decreased fine motor   Lower Extremity Assessment Lower Extremity Assessment: RLE deficits/detail RLE Deficits / Details: 1/5 hip flexion in sitting, 3-/5 knee extension, 2-/5 dorsiflexion; sensation (to light touch) intact, proprioception intact   Cervical / Trunk Assessment Cervical / Trunk Assessment: Normal   Communication Communication Communication: No apparent difficulties   Cognition Arousal: Alert Behavior During Therapy: WFL for tasks assessed/performed Overall Cognitive Status: Within Functional Limits for tasks assessed                                       General Comments       Exercises     Shoulder Instructions      Home Living Family/patient expects to be discharged to:: Private residence Living Arrangements: Spouse/significant other;Children Available Help at Discharge: Family;Available 24 hours/day Type of Home: House Home Access: Stairs to enter [employer] of Steps: 1 step down to mud room then 1 step up Entrance Stairs-Rails: None Home Layout: Two level;Able to live on main level with bedroom/bathroom Alternate Level Stairs-Number of Steps: 13 Alternate Level Stairs-Rails: Can reach both Bathroom Shower/Tub: Producer, television/film/video: Standard Bathroom Accessibility: Yes   Home Equipment: Shower seat - built in;Grab bars - tub/shower;Cane - single Librarian, academic (2 wheels)      Lives With: Spouse;Family;Daughter (son-in-law)    Prior Functioning/Environment               Mobility Comments: Pt reports she had progressed to ambulating PRN with SPC. ADLs Comments: indep        OT Problem List: Decreased strength;Decreased coordination;Impaired balance (sitting and/or standing);Decreased knowledge of use of DME or AE;Impaired UE functional use      OT Treatment/Interventions: Self-care/ADL training;Therapeutic exercise;Therapeutic activities;Neuromuscular  education;DME and/or AE instruction;Patient/family education;Balance training    OT Goals(Current goals can be found in the care plan section) Acute Rehab OT Goals Patient Stated Goal: motivated to be as independent as possible OT Goal Formulation: With patient Time For Goal Achievement: 05/26/23 Potential to Achieve Goals: Good ADL Goals Pt Will Perform Grooming: sitting;with modified independence Pt Will Perform Upper Body Dressing: sitting;with modified independence Pt Will Perform Lower Body Dressing: sit to/from stand;with supervision Pt Will Transfer to Toilet: with supervision;ambulating (LRAD) Additional ADL Goal #1: Pt will complete standing countertop ADL task with supervision, incorporating BUE into task without dropping/spilling, 3/3 opportunities.  OT Frequency: Min 1X/week    Co-evaluation PT/OT/SLP Co-Evaluation/Treatment: Yes Reason for Co-Treatment: For patient/therapist safety;To address functional/ADL transfers PT goals addressed during session: Mobility/safety with mobility;Balance OT goals addressed during session: ADL's and self-care      AM-PAC OT "6 Clicks" Daily Activity     Outcome Measure Help from another person eating meals?: None Help from another person taking care of personal grooming?: A Little Help from another person toileting, which includes using toliet, bedpan, or urinal?: A Little Help from another person bathing (including washing, rinsing, drying)?: A Little Help from another person to  put on and taking off regular upper body clothing?: A Little Help from another person to put on and taking off regular lower body clothing?: A Little 6 Click Score: 19   End of Session Equipment Utilized During Treatment: Gait belt  Activity Tolerance: Patient tolerated treatment well Patient left: in chair;with call bell/phone within reach;with chair alarm set  OT Visit Diagnosis: Hemiplegia and hemiparesis Hemiplegia - Right/Left: Right Hemiplegia -  dominant/non-dominant: Non-Dominant Hemiplegia - caused by: Cerebral infarction                Time: 1010-1032 OT Time Calculation (min): 22 min Charges:  OT General Charges $OT Visit: 1 Visit OT Evaluation $OT Eval Low Complexity: 1 Low  Arman Filter., MPH, MS, OTR/L ascom [phone] 05/12/23, 3:32 PM

## 2023-05-12 NOTE — Progress Notes (Signed)
PROGRESS NOTE    [name]  ZOX:[id number] DOB: May 01, 1956 DOA: 05/11/2023 PCP: Marisue Ivan, NP     Brief Narrative:  67 year old with history of CVA, renal artery stenosis status post endarterectomy, HTN, HLD, diastolic CHF, DM2, depression, migraines, Jehovah's Witness comes with right-sided weakness.  MRI showed acute left pontine infarct with other chronic infarcts.  Neurology was consulted and patient was admitted.   Assessment & Plan:  Principal Problem:   Stroke [facility]) Active Problems:   UTI (urinary tract infection)   Carotid stenosis   CAD (coronary artery disease)   HLD (hyperlipidemia)   HTN (hypertension)   Asthma   Diabetes mellitus without complication (HCC)     Right-sided weakness secondary to acute left pon CVA -Currently on aspirin, statin and Plavix.  Recent A1c 6.8, LDL 115.  CTA head and neck is negative for LVO.  Routine CVA workup including echocardiogram, neurology consult, PT/OT, speech and swallow Wonder if she would benefit from outpatient cardiac monitoring and also add full dose anticoagulation   UTI (urinary tract infection), POA Prior to admission diagnosed with UTI on Cipro at home, currently on IV Rocephin.  Complete total 3-day course.   Carotid artery stenosis Coronary artery disease Continue aspirin Plavix and statin   HLD (hyperlipidemia) Lipitor   HTN (hypertension) Permissive hypertension   Asthma: Stable -As needed bronchodilators   Diabetes mellitus without complication (HCC): Recent A1c 6.8, currently on Semglee, sliding scale and Accu-Cheks   Anxiety -As needed Ativan    DVT prophylaxis: Lovenox Code Status: Full code Family Communication:   Status is: Inpatient Remains inpatient appropriate because: Ongoing CVA workup    Subjective: Seen at bedside, still having right-sided weakness.   Examination:  General exam: Appears calm and comfortable  Respiratory system: Clear to auscultation. Respiratory  effort normal. Cardiovascular system: S1 & S2 heard, RRR. No JVD, murmurs, rubs, gallops or clicks. No pedal edema. Gastrointestinal system: Abdomen is nondistended, soft and nontender. No organomegaly or masses felt. Normal bowel sounds heard. Central nervous system: Alert and oriented. No focal neurological deficits. Extremities: Right-sided strength 4/5.  Left upper and lower extremity strength 5/5 Skin: No rashes, lesions or ulcers Psychiatry: Judgement and insight appear normal. Mood & affect appropriate.                 Diet Orders (From admission, onward)     Start     Ordered   05/12/23 0953  Diet vegetarian Room service appropriate? Yes; Fluid consistency: Thin  Diet effective now       Comments: Healthy heart and consistent carb NO MEAT/MEAT-BASED PRODUCTS IN ITEMS OR ON PLATE!  Question Answer Comment  Room service appropriate? Yes   Fluid consistency: Thin      05/12/23 0953            Objective: Vitals:   05/12/23 0829 05/12/23 1002 05/12/23 1028 05/12/23 1224  BP: 104/64 (!) 161/64 (!) 158/62 (!) 156/70  Pulse: 85 83 82 89  Resp: 16 18 18 18   Temp: 98 F (36.7 C) 97.7 F (36.5 C) 97.8 F (36.6 C) 97.8 F (36.6 C)  TempSrc:      SpO2: 99% 100% 100% 100%  Weight:      Height:        Intake/Output Summary (Last 24 hours) at 05/12/2023 1319 Last data filed at 05/12/2023 0958 Gross per 24 hour  Intake 90.81 ml  Output --  Net 90.81 ml   Filed Weights   05/11/23 1009 05/11/23  2031  Weight: 64 kg 64.9 kg    Scheduled Meds:  aspirin EC  81 mg Oral Daily   atorvastatin  20 mg Oral Daily   clopidogrel  75 mg Oral Daily   enoxaparin (LOVENOX) injection  40 mg Subcutaneous Q24H   insulin aspart  0-5 Units Subcutaneous QHS   insulin aspart  0-9 Units Subcutaneous TID WC   insulin glargine-yfgn  10 Units Subcutaneous QHS   sodium chloride flush  3 mL Intravenous Once   Continuous Infusions:  cefTRIAXone (ROCEPHIN)  IV Stopped (05/12/23  0023)    Nutritional status     Body mass index is 22.75 kg/m.  Data Reviewed:   CBC: Recent Labs  Lab 05/11/23 1011  WBC 4.2  NEUTROABS 1.9  HGB 11.8*  HCT 36.2  MCV 87.2  PLT 310   Basic Metabolic Panel: Recent Labs  Lab 05/08/23 1051 05/11/23 1011 05/12/23 0429  NA 141 139 142  K 4.7 3.7 3.6  CL 103 107 108  CO2 23 25 24   GLUCOSE 62* 85 72  BUN 23 20 23   CREATININE 0.98 0.90 0.99  CALCIUM 11.3* 10.3 10.2   GFR: Estimated Creatinine Clearance: 52.7 mL/min (by C-G formula based on SCr of 0.99 mg/dL). Liver Function Tests: Recent Labs  Lab 05/08/23 1051 05/11/23 1011  AST 14 17  ALT 8 11  ALKPHOS 115 93  BILITOT 0.3 0.5  PROT 7.0 7.5  ALBUMIN 4.3 4.2   No results for input(s): "LIPASE", "AMYLASE" in the last 168 hours. No results for input(s): "AMMONIA" in the last 168 hours. Coagulation Profile: Recent Labs  Lab 05/11/23 1011  INR 1.1   Cardiac Enzymes: No results for input(s): "CKTOTAL", "CKMB", "CKMBINDEX", "TROPONINI" in the last 168 hours. BNP (last 3 results) No results for input(s): "PROBNP" in the last 8760 hours. HbA1C: No results for input(s): "HGBA1C" in the last 72 hours. CBG: Recent Labs  Lab 05/12/23 0459 05/12/23 0530 05/12/23 0554 05/12/23 0914 05/12/23 1221  GLUCAP 66* 68* 77 124* 138*   Lipid Profile: Recent Labs    05/12/23 0429  CHOL 176  HDL 48  LDLCALC 115*  TRIG 67  CHOLHDL 3.7   Thyroid Function Tests: No results for input(s): "TSH", "T4TOTAL", "FREET4", "T3FREE", "THYROIDAB" in the last 72 hours. Anemia Panel: No results for input(s): "VITAMINB12", "FOLATE", "FERRITIN", "TIBC", "IRON", "RETICCTPCT" in the last 72 hours. Sepsis Labs: No results for input(s): "PROCALCITON", "LATICACIDVEN" in the last 168 hours.  No results found for this or any previous visit (from the past 240 hour(s)).       Radiology Studies: CT ANGIO HEAD NECK W WO CM  Result Date: 05/11/2023 CLINICAL DATA:  Follow-up  examination for acute stroke. EXAM: CT ANGIOGRAPHY HEAD AND NECK WITH AND WITHOUT CONTRAST TECHNIQUE: Multidetector CT imaging of the head and neck was performed using the standard protocol during bolus administration of intravenous contrast. Multiplanar CT image reconstructions and MIPs were obtained to evaluate the vascular anatomy. Carotid stenosis measurements (when applicable) are obtained utilizing NASCET criteria, using the distal internal carotid diameter as the denominator. RADIATION DOSE REDUCTION: This exam was performed according to the departmental dose-optimization program which includes automated exposure control, adjustment of the mA and/or kV according to patient size and/or use of iterative reconstruction technique. CONTRAST:  75mL OMNIPAQUE IOHEXOL 350 MG/ML SOLN COMPARISON:  CT and MRI from earlier the same day. FINDINGS: CTA NECK FINDINGS Aortic arch: Visualized aortic arch within normal limits for caliber with standard 3 vessel morphology. Moderate  atheromatous change about the arch itself. No high-grade stenosis about the origin the great vessels. Right carotid system: Right common and internal carotid arteries are tortuous but patent without dissection. Sequelae of prior endarterectomy at the right carotid bulb without significant residual or recurrent stenosis. Left carotid system: Left common and internal carotid arteries are tortuous without dissection. Sequelae of prior endarterectomy the left carotid bulb without significant residual or recurrent stenosis. Vertebral arteries: Both vertebral arteries arise from the subclavian arteries. Atheromatous change at the origins of both vertebral arteries with moderate stenosis on the right. Small penetrating plaque noted at the origin of the left vertebral artery (series 6, image 252). Vertebral arteries patent distally without additional significant stenosis or evidence for dissection. Skeleton: No discrete or worrisome osseous lesions. Moderate  spondylosis present at C5-6 and C6-7. Other neck: No other acute finding. Upper chest: No other acute finding. Review of the MIP images confirms the above findings CTA HEAD FINDINGS Anterior circulation: Atheromatous change seen about the carotid siphons with resultant severe stenosis at the supraclinoid right ICA, and more moderate stenosis about the contralateral supraclinoid left ICA. Left A1 segment dominant and widely patent. Right A1 hypoplastic and/or absent. Normal anterior communicating artery complex. Anterior cerebral arteries patent without stenosis. No M1 stenosis or occlusion. Distal MCA branches perfused and symmetric. Distal small vessel atheromatous irregularity noted. Posterior circulation: Dominant left V4 segment patent without significant stenosis. Atheromatous irregularity within the right V4 segment without significant stenosis. Both PICA patent. Basilar patent without stenosis. Superior cerebral arteries patent bilaterally. Both PCAs primarily supplied via the basilar. Left PCA patent to its distal aspect without stenosis. Focal severe distal right P2 stenosis noted (series 9, image 18). Right PCA otherwise patent to its distal aspect. Venous sinuses: Patent allowing for timing the contrast bolus. Anatomic variants: As above.  No aneurysm. Review of the MIP images confirms the above findings IMPRESSION: 1. Negative CTA for large vessel occlusion or other emergent finding. 2. Sequelae of prior bilateral carotid endarterectomies without significant residual or recurrent stenosis. 3. Atheromatous change about the carotid siphons with resultant severe stenosis at the supraclinoid right ICA, and more moderate stenosis about the contralateral supraclinoid left ICA. 4. Focal severe distal right P2 stenosis. 5. Atheromatous change at the origins of both vertebral arteries with moderate stenosis on the right. Small penetrating plaque at the origin of the left vertebral artery. 6. Diffuse tortuosity of  the major arterial vasculature of the head and neck, suggesting chronic underlying hypertension. Aortic Atherosclerosis (ICD10-I70.0). Electronically Signed   By: Rise Mu M.D.   On: 05/11/2023 20:24   MR BRAIN WO CONTRAST  Result Date: 05/11/2023 EXAM: MRI HEAD WITHOUT CONTRAST TECHNIQUE: Multiplanar, multiecho pulse sequences of the brain and surrounding structures were obtained without intravenous contrast. COMPARISON:  CT head from today. FINDINGS: Brain: Acute infarct in the left pons. No acute hemorrhage, hydrocephalus, extra-axial collection or mass lesion. Remote infarct in the right parietal lobe. Remote lacunar infarct in the right thalamus. Scattered T2/FLAIR hyperintensities in the white matter, compatible with chronic microvascular ischemic disease. Punctate foci of susceptibility artifact in the pons, compatible with prior microhemorrhages. Vascular: Major arterial flow voids are maintained at the skull base. Skull and upper cervical spine: Normal marrow signal. Sinuses/Orbits: Clear sinuses.  No acute orbital findings. Other: No mastoid effusions. IMPRESSION: 1. Acute infarct in the left pons. 2. Remote infarcts and chronic microvascular ischemic change. Electronically Signed   By: Feliberto Harts M.D.   On: 05/11/2023 18:21  CT HEAD WO CONTRAST  Result Date: 05/11/2023 CLINICAL DATA:  Acute right-sided weakness beginning yesterday. Acute neurologic deficit. EXAM: CT HEAD WITHOUT CONTRAST TECHNIQUE: Contiguous axial images were obtained from the base of the skull through the vertex without intravenous contrast. RADIATION DOSE REDUCTION: This exam was performed according to the departmental dose-optimization program which includes automated exposure control, adjustment of the mA and/or kV according to patient size and/or use of iterative reconstruction technique. COMPARISON:  02/15/2023 FINDINGS: Brain: No evidence of intracranial hemorrhage, acute infarction, hydrocephalus,  extra-axial collection, or mass lesion/mass effect. Stable old right posterior parietal infarct. Stable old right thalamic lacunar infarct and chronic small vessel disease. Vascular:  No hyperdense vessel or other acute findings. Skull: No evidence of fracture or other significant bone abnormality. Sinuses/Orbits:  No acute findings. Other: None. IMPRESSION: No acute intracranial abnormality. Stable old right parietal infarct, old right thalamic lacunar infarct, and chronic small vessel disease. Electronically Signed   By: Danae Orleans M.D.   On: 05/11/2023 10:58           LOS: 1 day   Time spent= 35 mins    Miguel Rota, MD [facility]  If 7PM-7AM, please contact night-coverage  05/12/2023, 1:19 PM

## 2023-05-13 ENCOUNTER — Other Ambulatory Visit (HOSPITAL_COMMUNITY): Payer: Self-pay

## 2023-05-13 DIAGNOSIS — I639 Cerebral infarction, unspecified: Secondary | ICD-10-CM | POA: Diagnosis not present

## 2023-05-13 LAB — CBC
HCT: 30.9 % — ABNORMAL LOW (ref 36.0–46.0)
Hemoglobin: 10.1 g/dL — ABNORMAL LOW (ref 12.0–15.0)
MCH: 28.1 pg (ref 26.0–34.0)
MCHC: 32.7 g/dL (ref 30.0–36.0)
MCV: 85.8 fL (ref 80.0–100.0)
Platelets: 260 10*3/uL (ref 150–400)
RBC: 3.6 MIL/uL — ABNORMAL LOW (ref 3.87–5.11)
RDW: 12.7 % (ref 11.5–15.5)
WBC: 4.8 10*3/uL (ref 4.0–10.5)
nRBC: 0 % (ref 0.0–0.2)

## 2023-05-13 LAB — GLUCOSE, CAPILLARY
Glucose-Capillary: 108 mg/dL — ABNORMAL HIGH (ref 70–99)
Glucose-Capillary: 189 mg/dL — ABNORMAL HIGH (ref 70–99)
Glucose-Capillary: 82 mg/dL (ref 70–99)
Glucose-Capillary: 166 mg/dL — ABNORMAL HIGH (ref 70–99)

## 2023-05-13 LAB — BASIC METABOLIC PANEL
Anion gap: 9 (ref 5–15)
BUN: 28 mg/dL — ABNORMAL HIGH (ref 8–23)
CO2: 24 mmol/L (ref 22–32)
Calcium: 10.1 mg/dL (ref 8.9–10.3)
Chloride: 110 mmol/L (ref 98–111)
Creatinine, Ser: 0.77 mg/dL (ref 0.44–1.00)
GFR, Estimated: >60 mL/min (ref >60)
Glucose, Bld: 132 mg/dL — ABNORMAL HIGH (ref 70–99)
Potassium: 3.9 mmol/L (ref 3.5–5.1)
Sodium: 143 mmol/L (ref 135–145)

## 2023-05-13 LAB — MAGNESIUM: Magnesium: 2.1 mg/dL (ref 1.7–2.4)

## 2023-05-13 LAB — PHOSPHORUS: Phosphorus: 3.6 mg/dL (ref 2.5–4.6)

## 2023-05-13 MED ORDER — CEFTRIAXONE SODIUM 1 G IJ SOLR
1.0000 g | INTRAMUSCULAR | Status: DC
  Filled 2023-05-13: qty 10

## 2023-05-13 MED ORDER — SODIUM CHLORIDE 0.9 % IV SOLN
1.0000 g | Freq: Every day | INTRAVENOUS | Status: DC
  Administered 2023-05-13 – 2023-05-14 (×2): 1 g via INTRAVENOUS
  Filled 2023-05-13 (×2): qty 10

## 2023-05-13 MED ORDER — HYDRALAZINE HCL 20 MG/ML IJ SOLN: 5.0000 mg | Freq: Four times a day (QID) | INTRAMUSCULAR | Status: DC | PRN

## 2023-05-13 MED ORDER — LOSARTAN POTASSIUM 50 MG PO TABS
100.0000 mg | ORAL_TABLET | Freq: Every day | ORAL | Status: DC
  Administered 2023-05-13 – 2023-05-14 (×2): 100 mg via ORAL
  Filled 2023-05-13 (×2): qty 2

## 2023-05-13 MED ORDER — KETOROLAC TROMETHAMINE 15 MG/ML IJ SOLN: 15.0000 mg | Freq: Once | INTRAMUSCULAR | Status: DC

## 2023-05-13 MED ORDER — ACETAMINOPHEN 325 MG PO TABS: 650.0000 mg | ORAL_TABLET | Freq: Once | ORAL | Status: DC

## 2023-05-13 MED ORDER — LABETALOL HCL 5 MG/ML IV SOLN
10.0000 mg | INTRAVENOUS | Status: DC | PRN
  Administered 2023-05-14: 10 mg via INTRAVENOUS
  Filled 2023-05-13: qty 4

## 2023-05-13 NOTE — Progress Notes (Signed)
Occupational Therapy Treatment Patient Details Name: [name] MRN: [id number] DOB: [birth year] Today's Date: 05/13/2023   History of present illness Pt is a 67 y/o F admitted on 05/11/23 after presenting with R sided weakness. Brain MRI showed acute infarct in the L pons. PMH: stroke, carotid artery stenosis s/p endarterectomy, HTN, HLD, CAD, DM, dCHF, depression with anxiety, migraine HA, kidney stones, Jehovah witness, R intertrochanteric femur fx s/p IM nail 02/15/23, peripheral neuropathy, MI   OT comments  Pt in bed upon OT arrival, agreeable to participate in OT session this date.  Pt very quickly verbalized that her new goal is to return home and not have to go to inpatient rehab.  Pt reported that she feels like she is moving better, and even did her own grooming and toileting after a very delayed response to call light yesterday.  Pt required min guard-supv for ambulation with RW around bed to reach recliner.  Pt sat up in chair to participate in R hand strengthening/coordination activities as noted below.  Avoided theraband exercises for RUE this date as BP still running elevated at 182/78 after NT took vitals during OT session.  Pt verbalized a couple of times how she is "very L handed, and has always used RUE minimally."  OT reinforced importance of targeting RUE strength and coordination activities for bimanual ADL/IADL tasks such as grooming, transporting a laundry basket, moving heavy pots/pans in kitchen, and reaching into overhead shelves in the home.  Pt verbalized understanding and did show excellent participation in theraputty exercises and FMC exercises noted below.  Visual handouts issued with encouragement for pt to use putty several times per day.  Discussed various therapy settings with pt, including CIR, HH, and outpatient rehab.  With pt's improving mobility and ADL status, OT recommending outpatient therapy at this time.  Will continue to follow in the acute setting to  maximize RUE function for ADL/IADL tasks.  All items placed within reach and OT reminded pt to use call light for assist when ready to return to bed or if needing to use the bathroom in order to reduce fall risk.  Pt verbalized understanding.      If plan is discharge home, recommend the following:  A little help with walking and/or transfers;A little help with bathing/dressing/bathroom;Assistance with cooking/housework;Assist for transportation;Help with stairs or ramp for entrance   Equipment Recommendations  Other (comment) (defer to next venue of care)    Recommendations for Other Services      Precautions / Restrictions Precautions Precautions: Fall Precaution Comments: R hemiplegia Restrictions Weight Bearing Restrictions: No       Mobility Bed Mobility Overal bed mobility: Needs Assistance Bed Mobility: Supine to Sit     Supine to sit: Modified independent (Device/Increase time)     General bed mobility comments: Good use of RUE throughout with ability to push into bed with BUEs for transitions Patient Response: Cooperative  Transfers Overall transfer level: Needs assistance Equipment used: Rolling walker (2 wheels) Transfers: Sit to/from Stand       Step pivot transfers: Contact guard assist           Balance Overall balance assessment: Needs assistance Sitting-balance support: Feet supported, No upper extremity supported Sitting balance-[name] Scale: Good     Standing balance support: During functional activity, Bilateral upper extremity supported, Reliant on assistive device for balance Standing balance-[name] Scale: Poor  ADL either performed or assessed with clinical judgement   ADL Overall ADL's : Needs assistance/impaired                                       General ADL Comments: Min guard-supv with RW within room; pt declined bathroom ADLs this morning and reported that she had already  brushed teeth and did not need to use the bathroom.    Extremity/Trunk Assessment Upper Extremity Assessment Upper Extremity Assessment: Left hand dominant RUE Deficits / Details: Pt able to perform finger opposition but with increased time, mild FMC deficits, but overall 4-/5 strength RUE Sensation: WNL RUE Coordination: decreased fine motor   Lower Extremity Assessment Lower Extremity Assessment: Defer to PT evaluation;RLE deficits/detail   Cervical / Trunk Assessment Cervical / Trunk Assessment: Normal                      Cognition Arousal: Alert Behavior During Therapy: WFL for tasks assessed/performed Overall Cognitive Status: Within Functional Limits for tasks assessed                                          Exercises Other Exercises Other Exercises: Issued green theraputty and instructed pt in R hand exercises as follows: gross grasping, lateral pinching, lumbrical pinching, digit opposition, digit ext, digit add, thumb IP flexion, digging coins out of putty.  Reviewed additional R hand FMC/dexterity exercises, including reps of digit flex/ext, digit abd/add, digit opposition, thumb circles, picking up small objects from table, storage and translatory movements of items in palm, card activities, tying laces (handouts issued for putty and [facility] exercises with good return demo).                Pertinent Vitals/ Pain       Pain Assessment Pain Assessment: No/denies pain                                            Prior Functioning/Environment              Frequency  Min 1X/week        Progress Toward Goals  OT Goals(current goals can now be found in the care plan section)  Progress towards OT goals: Progressing toward goals  Acute Rehab OT Goals Patient Stated Goal: motivated to return home and reports she does not wish to go to CIR OT Goal Formulation: With patient Time For Goal Achievement: 05/26/23 Potential  to Achieve Goals: Good  Plan                       AM-PAC OT "6 Clicks" Daily Activity     Outcome Measure   Help from another person eating meals?: None Help from another person taking care of personal grooming?: None Help from another person toileting, which includes using toliet, bedpan, or urinal?: A Little Help from another person bathing (including washing, rinsing, drying)?: A Little Help from another person to put on and taking off regular upper body clothing?: A Little Help from another person to put on and taking off regular lower body clothing?: A Little 6 Click Score: 20    End of  Session Equipment Utilized During Treatment: Gait belt;Rolling walker (2 wheels)  OT Visit Diagnosis: Hemiplegia and hemiparesis Hemiplegia - Right/Left: Right Hemiplegia - dominant/non-dominant: Non-Dominant Hemiplegia - caused by: Cerebral infarction   Activity Tolerance Patient tolerated treatment well   Patient Left in chair;with call bell/phone within reach;with chair alarm set             Time: 1610-9604 OT Time Calculation (min): 33 min  Charges: OT General Charges $OT Visit: 1 Visit OT Treatments $Self Care/Home Management : 8-22 mins $Neuromuscular Re-education: 8-22 mins  Danelle Earthly, MS, OTR/L   Otis Dials 05/13/2023, 12:02 PM

## 2023-05-13 NOTE — Progress Notes (Signed)
PHARMACY CONSULT NOTE  Pharmacy Consult for Electrolyte Monitoring and Replacement   Recent Labs: Potassium (mmol/L)  Date Value  05/13/2023 3.9  05/24/2014 3.9   Magnesium (mg/dL)  Date Value  40/98/1191 2.1   Calcium (mg/dL)  Date Value  47/82/9562 10.1   Calcium, Total (mg/dL)  Date Value  13/01/6577 8.7   Albumin (g/dL)  Date Value  46/96/2952 4.2  05/08/2023 4.3  05/24/2014 3.4   Phosphorus (mg/dL)  Date Value  84/13/2440 3.6   Sodium (mmol/L)  Date Value  05/13/2023 143  05/08/2023 141  05/24/2014 140     Assessment:  67 y.o. female with medical history significant of stroke, carotid artery stenosis (s/p of endarterectomy), HTN, HLD, CAD, DM, dCHF, depression with anxiety, migraine headaches, kidney stone, Jehovah witness, who presents with right-sided weakness.   Goal of Therapy:  Electrolytes WNL  Plan:  ---no electrolyte replacement warranted for today ---recheck electrolytes in am  Elliot Gurney, PharmD, BCPS Clinical Pharmacist  05/13/2023 8:08 AM

## 2023-05-13 NOTE — Plan of Care (Signed)
  Problem: Education: Goal: Knowledge of disease or condition will improve Outcome: Progressing Goal: Knowledge of secondary prevention will improve (MUST DOCUMENT ALL) Outcome: Progressing Goal: Knowledge of patient specific risk factors will improve Loraine Leriche N/A or DELETE if not current risk factor) Outcome: Progressing   Problem: Ischemic Stroke/TIA Tissue Perfusion: Goal: Complications of ischemic stroke/TIA will be minimized Outcome: Progressing   Problem: Coping: Goal: Will verbalize positive feelings about self Outcome: Progressing Goal: Will identify appropriate support needs Outcome: Progressing   Problem: Health Behavior/Discharge Planning: Goal: Ability to manage health-related needs will improve Outcome: Progressing Goal: Goals will be collaboratively established with patient/family Outcome: Progressing   Problem: Self-Care: Goal: Ability to participate in self-care as condition permits will improve Outcome: Progressing Goal: Verbalization of feelings and concerns over difficulty with self-care will improve Outcome: Progressing Goal: Ability to communicate needs accurately will improve Outcome: Progressing   Problem: Nutrition: Goal: Risk of aspiration will decrease Outcome: Progressing Goal: Dietary intake will improve Outcome: Progressing   Problem: Education: Goal: Ability to describe self-care measures that may prevent or decrease complications (Diabetes Survival Skills Education) will improve Outcome: Progressing Goal: Individualized Educational Video(s) Outcome: Progressing   Problem: Coping: Goal: Ability to adjust to condition or change in health will improve Outcome: Progressing   Problem: Fluid Volume: Goal: Ability to maintain a balanced intake and output will improve Outcome: Progressing   Problem: Health Behavior/Discharge Planning: Goal: Ability to identify and utilize available resources and services will improve Outcome:  Progressing Goal: Ability to manage health-related needs will improve Outcome: Progressing   Problem: Metabolic: Goal: Ability to maintain appropriate glucose levels will improve Outcome: Progressing   Problem: Nutritional: Goal: Maintenance of adequate nutrition will improve Outcome: Progressing Goal: Progress toward achieving an optimal weight will improve Outcome: Progressing   Problem: Skin Integrity: Goal: Risk for impaired skin integrity will decrease Outcome: Progressing   Problem: Tissue Perfusion: Goal: Adequacy of tissue perfusion will improve Outcome: Progressing   Problem: Education: Goal: Knowledge of General Education information will improve Description: Including pain rating scale, medication(s)/side effects and non-pharmacologic comfort measures Outcome: Progressing   Problem: Health Behavior/Discharge Planning: Goal: Ability to manage health-related needs will improve Outcome: Progressing   Problem: Clinical Measurements: Goal: Ability to maintain clinical measurements within normal limits will improve Outcome: Progressing Goal: Will remain free from infection Outcome: Progressing Goal: Diagnostic test results will improve Outcome: Progressing Goal: Respiratory complications will improve Outcome: Progressing Goal: Cardiovascular complication will be avoided Outcome: Progressing   Problem: Activity: Goal: Risk for activity intolerance will decrease Outcome: Progressing   Problem: Nutrition: Goal: Adequate nutrition will be maintained Outcome: Progressing   Problem: Coping: Goal: Level of anxiety will decrease Outcome: Progressing   Problem: Elimination: Goal: Will not experience complications related to bowel motility Outcome: Progressing Goal: Will not experience complications related to urinary retention Outcome: Progressing   Problem: Pain Management: Goal: General experience of comfort will improve Outcome: Progressing   Problem:  Safety: Goal: Ability to remain free from injury will improve Outcome: Progressing   Problem: Skin Integrity: Goal: Risk for impaired skin integrity will decrease Outcome: Progressing

## 2023-05-13 NOTE — TOC Progression Note (Signed)
Transition of Care [facility]) - Progression Note    Patient Details  Name: [name] MRN: [id number] Date of Birth: [birth year]  Transition of Care [facility]) CM/SW Contact  Allena Katz, LCSW Phone Number: 05/13/2023, 10:53 AM  Clinical Narrative:   Went to discuss CIR with patient but patient is out of the room. CSW will follow back up once patient returns.         Expected Discharge Plan and Services                                               Social Determinants of Health (SDOH) Interventions SDOH Screenings   Food Insecurity: No Food Insecurity (05/11/2023)  Housing: Low Risk  (05/11/2023)  Transportation Needs: No Transportation Needs (05/11/2023)  Utilities: Not At Risk (05/11/2023)  Tobacco Use: Medium Risk (05/11/2023)    Readmission Risk Interventions     No data to display

## 2023-05-13 NOTE — TOC Initial Note (Addendum)
Transition of Care [facility]) - Initial/Assessment Note    Patient Details  Name: [name] MRN: [id number] Date of Birth: 1956/04/09  Transition of Care [facility]) CM/SW Contact:    Allena Katz, LCSW Phone Number: 05/13/2023, 2:20 PM  Clinical Narrative:  CSW spoke with patient regarding recommendation changing from CIR to HH/outpatient. Pt reports she was just active with a HH agency and would like to use them again. Pt has a cane and a walker at home. Pt lives with her daughter and son in law and her husband. Pt reports they assist her to MD appointments. Pt [facility] sees a provider [facility]. [facility] [address].CSW will look on patient ping to see who patient was active with before and arrange with patient.                 3:15pm [facility] accepted for [facility] PT/OT  Expected Discharge Plan: Home w Home Health Services Barriers to Discharge: Continued Medical Work up   Patient Goals and CMS Choice   CMS Medicare.gov Compare Post Acute Care list provided to:: Patient Choice offered to / list presented to : Patient      Expected Discharge Plan and Services     Post Acute Care Choice: Durable Medical Equipment Living arrangements for the past 2 months: Single Family Home                                      Prior Living Arrangements/Services Living arrangements for the past 2 months: Single Family Home Lives with:: Adult Children Patient language and need for interpreter reviewed:: Yes Do you feel safe going back to the place where you live?: Yes      Need for Family Participation in Patient Care: Yes (Comment)   Current home services: DME Criminal Activity/Legal Involvement Pertinent to Current Situation/Hospitalization: No - Comment as needed  Activities of Daily Living   ADL Screening (condition at time of admission) Independently performs ADLs?: Yes (appropriate for developmental age) Is the patient deaf or have difficulty  hearing?: Yes Does the patient have difficulty seeing, even when wearing glasses/contacts?: Yes Does the patient have difficulty concentrating, remembering, or making decisions?: Yes  Permission Sought/Granted                  Emotional Assessment     Affect (typically observed): Accepting Orientation: : Oriented to Self, Oriented to Place, Oriented to  Time, Oriented to Situation      Admission diagnosis:  Stroke [facility]) [I63.9] Right leg weakness [R29.898] Left pontine CVA [facility]) [I63.9] Patient Active Problem List   Diagnosis Date Noted   Diabetes mellitus without complication (HCC) 05/11/2023   Depression with anxiety 05/11/2023   Encounter to establish care 05/08/2023   Hip fracture (HCC) 02/15/2023   HTN (hypertension) 02/15/2023   UTI (urinary tract infection) 02/15/2023   Status post placement of implantable loop recorder 07/29/2019   Bilateral carotid artery stenosis 11/20/2017   S/P PTCA (percutaneous transluminal coronary angioplasty) 11/11/2017   Left pontine CVA (HCC)    Refusal of blood transfusions as patient is Jehovah's Witness    Peripheral neuropathy    Myocardial infarction (HCC)    Migraine aura without headache    HLD (hyperlipidemia)    History of nuclear stress test    History of kidney stones    History of echocardiogram    Diabetes mellitus type 2,  uncontrolled    Depression    Cataract    Chronic lower back pain    Asthma    Arthritis    Anxiety    Carotid stenosis, asymptomatic 06/23/2015   Mitral valve annular calcification 05/11/2015   DM (diabetes mellitus), type 2 (HCC) 05/10/2015   Proliferative diabetic retinopathy (HCC) 12/17/2013   Breast mass, left 01/10/2012   Carotid stenosis 12/30/2011   Embolic stroke (HCC) 12/14/2011   Hypertension, accelerated 12/12/2011   CAD (coronary artery disease) 12/12/2011   PCP:  Marisue Ivan, NP [facility]acy[phone],  - [address] [place] [address] Phone: [phone] Fax: [phone]     Social Determinants of Health (SDOH) Social History: SDOH Screenings   Food Insecurity: No Food Insecurity (05/11/2023)  Housing: Low Risk  (05/11/2023)  Transportation Needs: No Transportation Needs (05/11/2023)  Utilities: Not At Risk (05/11/2023)  Tobacco Use: Medium Risk (05/11/2023)   SDOH Interventions:     Readmission Risk Interventions    05/13/2023    2:17 PM  Readmission Risk Prevention Plan  Transportation Screening Complete  PCP or Specialist Appt within 5-7 Days Complete  Home Care Screening Complete  Medication Review (RN CM) Complete

## 2023-05-13 NOTE — Progress Notes (Signed)
[facility] Progress Note  Patient: [name]    YTK:[id number]  DOA: 05/11/2023     Date of Service: the patient was seen and examined on 05/13/2023  Chief Complaint  Patient presents with   Weakness   Brief hospital course: 67 year old with history of CVA, renal artery stenosis status post endarterectomy, HTN, HLD, diastolic CHF, DM2, depression, migraines, Jehovah's Witness comes with right-sided weakness.  MRI showed acute left pontine infarct with other chronic infarcts.  Neurology was consulted and patient was admitted.     Assessment and Plan:  Principal Problem:   Stroke [facility]) Active Problems:   UTI (urinary tract infection)   Carotid stenosis   CAD (coronary artery disease)   HLD (hyperlipidemia)   HTN (hypertension)   Asthma   Diabetes mellitus without complication (HCC)     # Acute left pontine stroke, POA  Pt presented with right-sided weakness  Currently on aspirin, statin and Plavix.  Recent A1c 6.8, LDL 115.  CTA head and neck is negative for LVO.   TTE shows LVEF 65 to 70%, grade 1 diastolic dysfunction, LA severely dilated, RA moderately dilated, mild to moderate Neurology consulted, PT/OT, speech and swallow Patient has implanted loop monitor for past few years, interrogation was done by cardiology, no abnormal rhythm was noticed, patient had an episode of tachycardia possible secondary to MRI.  As per neurology, no indication of anticoagulation and continue DAPT, may consider to switch from Plavix to Brilinta, contacted pharmacy to check on coverage and price information but again she will be with compliance, patient denies twice daily dose versus Plavix once daily.    UTI (urinary tract infection), POA Prior to admission diagnosed with UTI on Cipro at home, currently on IV Rocephin.   Complete total 5 day course. Follow urine culture  Carotid artery stenosis Coronary artery disease Continue aspirin Plavix and statin   HLD  (hyperlipidemia) Lipitor   HTN (hypertension) Permissive hypertension   Asthma: Stable -As needed bronchodilators   Diabetes mellitus without complication: Recent A1c 6.8, currently on Semglee, sliding scale and Accu-Cheks   Anxiety -As needed Ativan   Body mass index is 22.75 kg/m.  Interventions:  Diet: Vegetarian diet DVT Prophylaxis: Subcutaneous Lovenox   Advance goals of care discussion: Full code  Family Communication: family was not present at bedside, at the time of interview.  The pt provided permission to discuss medical plan with the family. Opportunity was given to ask question and all questions were answered satisfactorily.   Disposition:  Pt is from Home, admitted with Acute CVA, still has right sede weakness, but clinically stable, medically optimized for discharge to acute rehab. TOC is working for placement.  Subjective: No significant events overnight, patient still has weakness on the right side denied any complaint.  Patient is still complaining of symptoms of UTI, burning and frequency.  No any pain in the abdomen, no chest pain or palpitation, no shortness of breath.  Physical Exam: General: NAD, lying comfortably Appear in no distress, affect appropriate Eyes: PERRLA ENT: Oral Mucosa Clear, moist  Neck: no JVD,  Cardiovascular: S1 and S2 Present, no Murmur,  Respiratory: good respiratory effort, Bilateral Air entry equal and Decreased, no Crackles, no wheezes Abdomen: Bowel Sound present, Soft and no tenderness,  Skin: no rashes Extremities: no Pedal edema, no calf tenderness Neurologic: Residual weakness on right side, power 4/5, No new focal findings.  Gait not checked due to patient safety concerns  Vitals:   05/13/23 0008 05/13/23 0357 05/13/23  0803 05/13/23 1111  BP: 116/70 101/65 (!) 135/102 (!) 182/78  Pulse: 84 85 81 80  Resp: 18 18 18 19   Temp: 98.2 F (36.8 C) 98.2 F (36.8 C) 97.8 F (36.6 C) 98.8 F (37.1 C)  TempSrc: Oral  Oral    SpO2: 99% 97% 100% 100%  Weight:      Height:        Intake/Output Summary (Last 24 hours) at 05/13/2023 1359 Last data filed at 05/13/2023 1049 Gross per 24 hour  Intake 240 ml  Output --  Net 240 ml   Filed Weights   05/11/23 1009 05/11/23 2031  Weight: 64 kg 64.9 kg    Data Reviewed: I have personally reviewed and interpreted daily labs, tele strips, imagings as discussed above. I reviewed all nursing notes, pharmacy notes, vitals, pertinent old records I have discussed plan of care as described above with RN and patient/family.  CBC: Recent Labs  Lab 05/11/23 1011 05/13/23 0459  WBC 4.2 4.8  NEUTROABS 1.9  --   HGB 11.8* 10.1*  HCT 36.2 30.9*  MCV 87.2 85.8  PLT 310 260   Basic Metabolic Panel: Recent Labs  Lab 05/08/23 1051 05/11/23 1011 05/12/23 0429 05/13/23 0459  NA 141 139 142 143  K 4.7 3.7 3.6 3.9  CL 103 107 108 110  CO2 23 25 24 24   GLUCOSE 62* 85 72 132*  BUN 23 20 23  28*  CREATININE 0.98 0.90 0.99 0.77  CALCIUM 11.3* 10.3 10.2 10.1  MG  --   --   --  2.1  PHOS  --   --   --  3.6    Studies: No results found.  Scheduled Meds:  acetaminophen  650 mg Oral Once   aspirin EC  81 mg Oral Daily   atorvastatin  20 mg Oral Daily   clopidogrel  75 mg Oral Daily   enoxaparin (LOVENOX) injection  40 mg Subcutaneous Q24H   insulin aspart  0-5 Units Subcutaneous QHS   insulin aspart  0-9 Units Subcutaneous TID WC   insulin glargine-yfgn  10 Units Subcutaneous QHS   ketorolac  15 mg Intravenous Once   sodium chloride flush  3 mL Intravenous Once   Continuous Infusions:  cefTRIAXone (ROCEPHIN)  IV     PRN Meds: acetaminophen **OR** acetaminophen (TYLENOL) oral liquid 160 mg/5 mL **OR** acetaminophen, albuterol, dextromethorphan-guaiFENesin, hydrALAZINE, labetalol, LORazepam, meclizine, ondansetron (ZOFRAN) IV, senna-docusate, traZODone  Time spent: 35 minutes  Author: Gillis Santa. MD [facility] 05/13/2023 1:59 PM  To reach  On-call, see care teams to locate the attending and reach out to them via [email or web address]. If 7PM-7AM, please contact night-coverage If you still have difficulty reaching the attending provider, please page the [facility] (Director on Call) for [facility] on amion for assistance.

## 2023-05-13 NOTE — Progress Notes (Signed)
Patient refuses bed alarm. Patient feels comfortable getting up by herself with her walker. Patient states she will call out if she needs assistance.

## 2023-05-13 NOTE — Progress Notes (Signed)
Introduced patient to role of Statistician. Intake questions completed.  Patient does have a PCP, although she denies it being [employer], NP who is listed in chart. Patient states PCP is Yves Dill.  Patient and her husband live with her daughter and son-in-law after losing their farm. Patient endorses multiple stressors, including her husband being chronically depressed. Pt provided emotional support. Denies need for mental health provider right now.  Patient states she has been recommended for doing rehab at a facility, but "can rehab at home." Pt states she used [facility] PT/OT following her broken hip prior.

## 2023-05-13 NOTE — Discharge Instructions (Signed)
Your nurse navigator can be reached at [phone]

## 2023-05-13 NOTE — TOC Benefit Eligibility Note (Signed)
Patient Product/process development scientist completed.    The patient is insured through [employer]. Patient has Medicare and is not eligible for a copay card, but may be able to apply for patient assistance, if available.    Ran test claim for Brilinta 90 mg and the current 30 day co-pay is $418.00 due to a deductilbe.  Ran test claim for clopidogrel (Plavix) 75 mg and the current 30 day co-pay is $1.91.   This test claim was processed through [facility]- copay amounts may vary at other pharmacies due to pharmacy/plan contracts, or as the patient moves through the different stages of their insurance plan.     Roland Earl, CPHT Pharmacy Technician III Certified Patient Advocate [facility] Pharmacy Patient Advocate Team Direct Number: [phone]  Fax: [phone]

## 2023-05-13 NOTE — Plan of Care (Signed)
  Problem: Education: Goal: Knowledge of disease or condition will improve Outcome: Progressing Goal: Knowledge of secondary prevention will improve (MUST DOCUMENT ALL) Outcome: Progressing Goal: Knowledge of patient specific risk factors will improve Loraine Leriche N/A or DELETE if not current risk factor) Outcome: Progressing   Problem: Ischemic Stroke/TIA Tissue Perfusion: Goal: Complications of ischemic stroke/TIA will be minimized Outcome: Progressing   Problem: Coping: Goal: Will verbalize positive feelings about self Outcome: Progressing Goal: Will identify appropriate support needs Outcome: Progressing   Problem: Health Behavior/Discharge Planning: Goal: Ability to manage health-related needs will improve Outcome: Progressing Goal: Goals will be collaboratively established with patient/family Outcome: Progressing   Problem: Self-Care: Goal: Ability to participate in self-care as condition permits will improve Outcome: Progressing Goal: Verbalization of feelings and concerns over difficulty with self-care will improve Outcome: Progressing Goal: Ability to communicate needs accurately will improve Outcome: Progressing   Problem: Nutrition: Goal: Risk of aspiration will decrease Outcome: Progressing Goal: Dietary intake will improve Outcome: Progressing   Problem: Education: Goal: Ability to describe self-care measures that may prevent or decrease complications (Diabetes Survival Skills Education) will improve Outcome: Progressing Goal: Individualized Educational Video(s) Outcome: Progressing   Problem: Coping: Goal: Ability to adjust to condition or change in health will improve Outcome: Progressing   Problem: Fluid Volume: Goal: Ability to maintain a balanced intake and output will improve Outcome: Progressing   Problem: Health Behavior/Discharge Planning: Goal: Ability to identify and utilize available resources and services will improve Outcome:  Progressing Goal: Ability to manage health-related needs will improve Outcome: Progressing

## 2023-05-13 NOTE — Progress Notes (Signed)
Physical Therapy Treatment Patient Details Name: [name] MRN: [id number] DOB: 11-10-1955 Today's Date: 05/13/2023   History of Present Illness Pt is a 67 y/o F admitted on 05/11/23 after presenting with R sided weakness. Brain MRI showed acute infarct in the L pons. PMH: stroke, carotid artery stenosis s/p endarterectomy, HTN, HLD, CAD, DM, dCHF, depression with anxiety, migraine HA, kidney stones, Jehovah witness, R intertrochanteric femur fx s/p IM nail 02/15/23, peripheral neuropathy, MI    PT Comments  Pt alert and oriented, denies pain, and reports feeling much better today compared to previous session. Pt performed sup > sit with supervision; STS from EOB with minA and RW to steady 2/2 R hemiplegia. Pt amb 180' with RW, verbal cuing for proper use of RW, and minA 2/2 reported RLE instability with fatigue and observed R drift with RW. Pt would continue to benefit from skilled PT to address functional impairments, including R-sided weakness, balance deficits, and cardiovascular endurance impairments.    If plan is discharge home, recommend the following: A lot of help with bathing/dressing/bathroom;Assist for transportation;Assistance with cooking/housework;Help with stairs or ramp for entrance;A little help with walking and/or transfers   Can travel by private vehicle      yes  Equipment Recommendations  Other (comment) (defer to next level of care)    Recommendations for Other Services       Precautions / Restrictions Precautions Precautions: Fall Precaution Comments: R hemiplegia Restrictions Weight Bearing Restrictions: No     Mobility  Bed Mobility Overal bed mobility: Needs Assistance Bed Mobility: Supine to Sit     Supine to sit: Used rails, HOB elevated, Supervision          Transfers Overall transfer level: Needs assistance Equipment used: Rolling walker (2 wheels) Transfers: Sit to/from Stand     Step pivot transfers: Min assist       General  transfer comment: STS from EOB with RW; minA to steady upon standing    Ambulation/Gait Ambulation/Gait assistance: Min assist Gait Distance (Feet): 180 Feet Assistive device: Rolling walker (2 wheels) Gait Pattern/deviations: Drifts right/left, Decreased step length - left Gait velocity: decreased     General Gait Details: minA 2/2 slight unsteadiness and reported instability at RLE with fatigue; verbal cuing required to correct R drift with RW and maintain proper distance from [employer] Mobility     Tilt Bed    Modified Rankin (Stroke Patients Only)       Balance Overall balance assessment: Needs assistance Sitting-balance support: Feet supported, No upper extremity supported Sitting balance-[name] Scale: Good     Standing balance support: During functional activity, Bilateral upper extremity supported, Reliant on assistive device for balance Standing balance-[name] Scale: Poor Standing balance comment: reliant on RW for dynamic balance                            Cognition Arousal: Alert Behavior During Therapy: WFL for tasks assessed/performed Overall Cognitive Status: Within Functional Limits for tasks assessed                                          Exercises General Exercises - Lower Extremity Long Arc Quad: AROM, Strengthening, Right, 10 reps, Seated Hip Flexion/Marching: AROM, Strengthening, Right, 10 reps, Seated  General Comments        Pertinent Vitals/Pain Pain Assessment Pain Assessment: No/denies pain    Home Living                          Prior Function            PT Goals (current goals can now be found in the care plan section) Acute Rehab PT Goals Patient Stated Goal: get better PT Goal Formulation: With patient Time For Goal Achievement: 05/26/23 Potential to Achieve Goals: Good Progress towards PT goals: Progressing toward goals    Frequency    Min  1X/week      PT Plan      Co-evaluation              AM-PAC PT "6 Clicks" Mobility   Outcome Measure  Help needed turning from your back to your side while in a flat bed without using bedrails?: None Help needed moving from lying on your back to sitting on the side of a flat bed without using bedrails?: None Help needed moving to and from a bed to a chair (including a wheelchair)?: A Little Help needed standing up from a chair using your arms (e.g., wheelchair or bedside chair)?: A Little Help needed to walk in hospital room?: A Little Help needed climbing 3-5 steps with a railing? : A Little 6 Click Score: 20    End of Session Equipment Utilized During Treatment: Gait belt Activity Tolerance: Patient tolerated treatment well;No increased pain Patient left: in chair;with chair alarm set;with call bell/phone within reach Nurse Communication: Mobility status PT Visit Diagnosis: Muscle weakness (generalized) (M62.81);Hemiplegia and hemiparesis;Unsteadiness on feet (R26.81);Difficulty in walking, not elsewhere classified (R26.2);Other abnormalities of gait and mobility (R26.89) Hemiplegia - Right/Left: Right Hemiplegia - dominant/non-dominant: Non-dominant Hemiplegia - caused by: Cerebral infarction     Time: 0912-0931 PT Time Calculation (min) (ACUTE ONLY): 19 min  Charges:    $Gait Training: 8-22 mins PT General Charges $$ ACUTE PT VISIT: 1 Visit                       Shauna Hugh, SPT 05/13/2023, 11:13 AM

## 2023-05-14 DIAGNOSIS — I639 Cerebral infarction, unspecified: Secondary | ICD-10-CM | POA: Diagnosis not present

## 2023-05-14 LAB — BASIC METABOLIC PANEL
Anion gap: 6 (ref 5–15)
BUN: 26 mg/dL — ABNORMAL HIGH (ref 8–23)
CO2: 25 mmol/L (ref 22–32)
Calcium: 9.8 mg/dL (ref 8.9–10.3)
Chloride: 110 mmol/L (ref 98–111)
Creatinine, Ser: 0.78 mg/dL (ref 0.44–1.00)
GFR, Estimated: >60 mL/min (ref >60)
Glucose, Bld: 101 mg/dL — ABNORMAL HIGH (ref 70–99)
Potassium: 3.8 mmol/L (ref 3.5–5.1)
Sodium: 141 mmol/L (ref 135–145)

## 2023-05-14 LAB — CBC
HCT: 29.5 % — ABNORMAL LOW (ref 36.0–46.0)
Hemoglobin: 9.4 g/dL — ABNORMAL LOW (ref 12.0–15.0)
MCH: 28 pg (ref 26.0–34.0)
MCHC: 31.9 g/dL (ref 30.0–36.0)
MCV: 87.8 fL (ref 80.0–100.0)
Platelets: 244 10*3/uL (ref 150–400)
RBC: 3.36 MIL/uL — ABNORMAL LOW (ref 3.87–5.11)
RDW: 12.4 % (ref 11.5–15.5)
WBC: 4.5 10*3/uL (ref 4.0–10.5)
nRBC: 0 % (ref 0.0–0.2)

## 2023-05-14 LAB — URINE CULTURE: Culture: 60000 — AB

## 2023-05-14 LAB — IRON AND TIBC
Iron: 49 ug/dL (ref 28–170)
Saturation Ratios: 17 % (ref 10.4–31.8)
TIBC: 286 ug/dL (ref 250–450)
UIBC: 237 ug/dL

## 2023-05-14 LAB — GLUCOSE, CAPILLARY
Glucose-Capillary: 68 mg/dL — ABNORMAL LOW (ref 70–99)
Glucose-Capillary: 99 mg/dL (ref 70–99)
Glucose-Capillary: 181 mg/dL — ABNORMAL HIGH (ref 70–99)

## 2023-05-14 LAB — MAGNESIUM: Magnesium: 1.9 mg/dL (ref 1.7–2.4)

## 2023-05-14 MED ORDER — NITROFURANTOIN MONOHYD MACRO 100 MG PO CAPS: 100.0000 mg | ORAL_CAPSULE | Freq: Two times a day (BID) | ORAL | 0 refills | Status: AC

## 2023-05-14 MED ORDER — INSULIN GLARGINE-YFGN 100 UNIT/ML ~~LOC~~ SOLN
8.0000 [IU] | Freq: Every day | SUBCUTANEOUS | Status: DC
  Filled 2023-05-14: qty 0.08

## 2023-05-14 NOTE — Care Management Important Message (Signed)
Important Message  Patient Details  Name: [name] MRN: [id number] Date of Birth: 1955/11/20   Important Message Given:  Yes - Medicare IM     Bernadette Hoit 05/14/2023, 10:26 AM

## 2023-05-14 NOTE — Progress Notes (Signed)
PHARMACY CONSULT NOTE  Pharmacy Consult for Electrolyte Monitoring and Replacement   Recent Labs: Potassium (mmol/L)  Date Value  05/14/2023 3.8  05/24/2014 3.9   Magnesium (mg/dL)  Date Value  78/29/5621 1.9   Calcium (mg/dL)  Date Value  30/86/5784 9.8   Calcium, Total (mg/dL)  Date Value  69/62/9528 8.7   Albumin (g/dL)  Date Value  41/32/4401 4.2  05/08/2023 4.3  05/24/2014 3.4   Phosphorus (mg/dL)  Date Value  02/72/5366 3.6   Sodium (mmol/L)  Date Value  05/14/2023 141  05/08/2023 141  05/24/2014 140     Assessment:  67 y.o. female with medical history significant of stroke, carotid artery stenosis (s/p of endarterectomy), HTN, HLD, CAD, DM, dCHF, depression with anxiety, migraine headaches, kidney stone, Jehovah witness, who presents with right-sided weakness.   Goal of Therapy:  Electrolytes WNL  Plan:  ---no electrolyte replacement warranted for today ---recheck electrolytes in am  Elliot Gurney, PharmD, BCPS Clinical Pharmacist  05/14/2023 7:56 AM

## 2023-05-14 NOTE — Progress Notes (Signed)
Patient being discharged home. PIV removed. Discharge instructions and medications were went over with patient. Patient states that she understands and all questions were answered. Patient will be going home POV with son in law.

## 2023-05-14 NOTE — Discharge Summary (Signed)
[facility] Discharge Summary   Patient: [name] ZOX:[id number]  PCP: Marisue Ivan, NP  Date of admission: 05/11/2023   Date of discharge: 05/14/2023     Discharge Diagnoses:  Principal Problem:   Left pontine CVA (HCC) Active Problems:   UTI (urinary tract infection)   Carotid stenosis   CAD (coronary artery disease)   HLD (hyperlipidemia)   HTN (hypertension)   Asthma   Diabetes mellitus without complication (HCC)   Admitted From: Home Disposition:  Home with [facility] PT/OT  Recommendations for Outpatient Follow-up:  Follow with PCP in 1 week Follow-up with neurology in 1 to 2 weeks Follow up LABS/TEST:     Follow-up Information     Margaretann Loveless, MD Follow up.   Specialty: Internal Medicine Why: Hospital follow up Contact information: [address] [place] [address] [phone]                Diet recommendation: Cardiac and Carb modified diet  Activity: The patient is advised to gradually reintroduce usual activities, as tolerated  Discharge Condition: stable  Code Status: Full code   History of present illness: As per the H and P dictated on admission Hospital Course:  68 year old with history of CVA, renal artery stenosis status post endarterectomy, HTN, HLD, diastolic CHF, DM2, depression, migraines, Jehovah's Witness comes with right-sided weakness. MRI showed acute left pontine infarct with other chronic infarcts. Neurology was consulted and patient was admitted.    Assessment and Plan:  # Acute left pontine stroke, POA  Pt presented with right-sided weakness  Currently on aspirin, statin and Plavix.  Recent A1c 6.8, LDL 115.  CTA head and neck is negative for LVO.   TTE:  LVEF 65 to 70%, grade 1 diastolic dysfunction, LA severely dilated, RA moderately dilated, mild to moderate Neurology consulted, PT/OT, speech and swallow consulted Patient has implanted loop monitor for past few years, interrogation was done by cardiology,  no abnormal rhythm was noticed, patient had an episode of tachycardia possible secondary to MRI.  As per neurology, no indication of anticoagulation and continue DAPT, may consider to switch from Plavix to Brilinta, contacted pharmacy to check on coverage and price information but again there is issue of compliance.  Brilinta is twice a day as compared to Plavix once a day.  As per pharmacy Brilinta co-pay is $418.00 due to a deductilbe. Plavix co-pay is $1.91.  So I continued aspirin and Plavix.  Recommended to follow with neurology in 1 to 2 weeks as an outpatient.  # UTI (urinary tract infection), POA Prior to admission diagnosed with UTI on Cipro at home, s/p IV Rocephin given during hospital stay.  Urine culture grew E. coli resistant to ciprofloxacin.  Sensitive to penicillin, cephalosporin and nitrofurantoin.  Patient is allergic to penicillin, so prescribed Macrobid 100 mg p.o. twice daily for 5 days. # Carotid artery stenosis and Coronary artery disease Continue aspirin Plavix and statin # HLD (hyperlipidemia) Lipitor # HTN (hypertension) Permissive hypertension # Asthma: Stable. As needed bronchodilators # Diabetes mellitus without complication: Recent A1c 6.8, resumed [place] home dose.  Continue diabetic diet, monitor CBG at home and follow with PCP. # Anxiety: s/p Ativan as needed, not on any medication at home # Dizziness, continued meclizine.  Body mass index is 22.75 kg/m.  Nutrition Interventions:  - Patient was instructed, not to drive, operate heavy machinery, perform activities at heights, swimming or participation in water activities or provide baby sitting services while on Pain, Sleep and Anxiety  Medications; until her outpatient Physician has advised to do so again.  - Also recommended to not to take more than prescribed Pain, Sleep and Anxiety Medications.  Patient was seen by physical therapy, who recommended Home health, which was arranged. On the day of the discharge  the patient's vitals were stable, and no other acute medical condition were reported by patient. the patient was felt safe to be discharge at Home with Home health.  Consultants: Neurology Procedures: None  Discharge Exam: General: Appear in no distress, no Rash; Oral Mucosa Clear, moist. Cardiovascular: S1 and S2 Present, no Murmur, Respiratory: normal respiratory effort, Bilateral Air entry present and no Crackles, no wheezes Abdomen: Bowel Sound present, Soft and no tenderness, no hernia Extremities: no Pedal edema, no calf tenderness Neurology: CN grossly intact, right-sided residual weakness power 4/5. affect appropriate.  Filed Weights   05/11/23 1009 05/11/23 2031  Weight: 64 kg 64.9 kg   Vitals:   05/14/23 0748 05/14/23 1210  BP: (!) 149/77 (!) 195/92  Pulse: 84 83  Resp: 14 18  Temp: 97.7 F (36.5 C) 97.8 F (36.6 C)  SpO2: 100% 100%    DISCHARGE MEDICATION: Allergies as of 05/14/2023       Reactions   Other Shortness Of Breath   No blood or blood products   Penicillins Itching, Rash   Rash occurred ass a teenager -- took for strep throat.   Toradol [ketorolac Tromethamine] Other (See Comments)   Panic attack    Imdur [isosorbide Nitrate] Other (See Comments)   Headaches    Latex Rash   Rash on all points of contact (thick, heavy itching rash)   Metformin And Related Diarrhea, Nausea And Vomiting        Medication List     STOP taking these medications    ciprofloxacin 500 MG tablet Commonly known as: Cipro       TAKE these medications    aspirin EC 81 MG tablet Take 81 mg by mouth daily.   atorvastatin 40 MG tablet Commonly known as: LIPITOR Take 1 tablet (40 mg total) by mouth daily at 6 PM. What changed:  how much to take when to take this   clopidogrel 75 MG tablet Commonly known as: PLAVIX Take 75 mg by mouth daily.   losartan 100 MG tablet Commonly known as: COZAAR Take 100 mg by mouth daily.   meclizine 12.5 MG  tablet Commonly known as: ANTIVERT Take 1 tablet (12.5 mg total) by mouth 3 (three) times daily as needed for dizziness.   nitrofurantoin (macrocrystal-monohydrate) 100 MG capsule Commonly known as: MACROBID Take 1 capsule (100 mg total) by mouth 2 (two) times daily for 5 days.   Evaristo Bury FlexTouch 200 UNIT/ML FlexTouch Pen Generic drug: insulin degludec Inject 20 Units into the skin at bedtime.       Allergies  Allergen Reactions   Other Shortness Of Breath    No blood or blood products   Penicillins Itching and Rash    Rash occurred ass a teenager -- took for strep throat.   Toradol [Ketorolac Tromethamine] Other (See Comments)    Panic attack    Imdur [Isosorbide Nitrate] Other (See Comments)    Headaches    Latex Rash    Rash on all points of contact (thick, heavy itching rash)   Metformin And Related Diarrhea and Nausea And Vomiting   Discharge Instructions     Ambulatory referral to Neurology   Complete by: As directed    An appointment  is requested in approximately: 2 weeks   Call MD for:   Complete by: As directed    Any new neurological changes, weakness or numbness, any problem with speech.   Call MD for:  difficulty breathing, headache or visual disturbances   Complete by: As directed    Call MD for:  extreme fatigue   Complete by: As directed    Call MD for:  persistant dizziness or light-headedness   Complete by: As directed    Call MD for:  persistant nausea and vomiting   Complete by: As directed    Call MD for:  severe uncontrolled pain   Complete by: As directed    Call MD for:  temperature >100.4   Complete by: As directed    Diet - low sodium heart healthy   Complete by: As directed    Discharge instructions   Complete by: As directed    Follow with PCP in 1 week Follow-up with neurology in 1 to 2 weeks   Increase activity slowly   Complete by: As directed        The results of significant diagnostics from this hospitalization (including  imaging, microbiology, ancillary and laboratory) are listed below for reference.    Significant Diagnostic Studies: ECHOCARDIOGRAM COMPLETE  Result Date: 05/12/2023    ECHOCARDIOGRAM REPORT   Patient Name:   HILLORY KLINESMITH Date of Exam: 05/12/2023 Medical Rec #:  [id number]        Height:       66.5 in Accession #:    [id number]       Weight:       143.1 lb Date of Birth:  Jun 03, 1956        BSA:          1.744 m Patient Age:    67 years         BP:           100/55 mmHg Patient Gender: F                HR:           72 bpm. Exam Location:  [facility] Procedure: 2D Echo Indications:     Stroke I63.9  History:         Patient has prior history of Echocardiogram examinations, most                  recent 03/18/2019.  Sonographer:     Overton Mam RDCS, FASE Referring Phys:  Wynona Neat NIU Diagnosing Phys: Adrian Blackwater  Sonographer Comments: Technically difficult study due to poor echo windows and suboptimal parasternal window. IMPRESSIONS  1. Left ventricular ejection fraction, by estimation, is 65 to 70%. The left ventricle has normal function. The left ventricle has no regional wall motion abnormalities. There is mild concentric left ventricular hypertrophy. Left ventricular diastolic parameters are consistent with Grade I diastolic dysfunction (impaired relaxation).  2. Right ventricular systolic function is normal. The right ventricular size is normal.  3. Left atrial size was severely dilated.  4. Right atrial size was moderately dilated.  5. The mitral valve is degenerative. Mild to moderate mitral valve regurgitation. No evidence of mitral stenosis. Severe mitral annular calcification.  6. Tricuspid valve regurgitation is mild to moderate.  7. The aortic valve is normal in structure. Aortic valve regurgitation is mild. Aortic valve sclerosis/calcification is present, without any evidence of aortic stenosis.  8. The inferior vena cava is normal in size with greater than 50% respiratory  variability, suggesting  right atrial pressure of 3 mmHg. FINDINGS  Left Ventricle: Left ventricular ejection fraction, by estimation, is 65 to 70%. The left ventricle has normal function. The left ventricle has no regional wall motion abnormalities. The left ventricular internal cavity size was normal in size. There is  mild concentric left ventricular hypertrophy. Left ventricular diastolic parameters are consistent with Grade I diastolic dysfunction (impaired relaxation). Right Ventricle: The right ventricular size is normal. No increase in right ventricular wall thickness. Right ventricular systolic function is normal. Left Atrium: Left atrial size was severely dilated. Right Atrium: Right atrial size was moderately dilated. Pericardium: There is no evidence of pericardial effusion. Mitral Valve: The mitral valve is degenerative in appearance. There is severe thickening of the mitral valve leaflet(s). There is severe calcification of the mitral valve leaflet(s). Moderately decreased mobility of the mitral valve leaflets. Severe mitral annular calcification. Mild to moderate mitral valve regurgitation. No evidence of mitral valve stenosis. MV peak gradient, 9.4 mmHg. The mean mitral valve gradient is 4.0 mmHg. Tricuspid Valve: The tricuspid valve is normal in structure. Tricuspid valve regurgitation is mild to moderate. No evidence of tricuspid stenosis. Aortic Valve: The aortic valve is normal in structure. Aortic valve regurgitation is mild. Aortic valve sclerosis/calcification is present, without any evidence of aortic stenosis. Aortic valve peak gradient measures 5.5 mmHg. Pulmonic Valve: The pulmonic valve was normal in structure. Pulmonic valve regurgitation is trivial. No evidence of pulmonic stenosis. Aorta: The aortic root is normal in size and structure. Venous: The inferior vena cava is normal in size with greater than 50% respiratory variability, suggesting right atrial pressure of 3 mmHg. IAS/Shunts: No atrial level shunt  detected by color flow Doppler.  LEFT VENTRICLE PLAX 2D LVIDd:         4.40 cm   Diastology LVIDs:         3.10 cm   LV e' medial:    4.03 cm/s LV PW:         1.00 cm   LV E/e' medial:  22.1 LV IVS:        0.90 cm   LV e' lateral:   6.42 cm/s LVOT diam:     1.80 cm   LV E/e' lateral: 13.9 LV SV:         44 LV SV Index:   25 LVOT Area:     2.54 cm  RIGHT VENTRICLE RV Basal diam:  1.70 cm RV S prime:     12.60 cm/s TAPSE (M-mode): 2.1 cm LEFT ATRIUM             Index        RIGHT ATRIUM           Index LA diam:        3.80 cm 2.18 cm/m   RA Area:     10.70 cm LA Vol (A2C):   70.6 ml 40.47 ml/m  RA Volume:   21.10 ml  12.10 ml/m LA Vol (A4C):   68.5 ml 39.27 ml/m LA Biplane Vol: 71.8 ml 41.16 ml/m  AORTIC VALVE AV Area (Vmax): 1.87 cm AV Vmax:        117.00 cm/s AV Peak Grad:   5.5 mmHg LVOT Vmax:      86.20 cm/s LVOT Vmean:     53.000 cm/s LVOT VTI:       0.171 m  AORTA Ao Root diam: 3.10 cm MITRAL VALVE MV Area (PHT): 2.72 cm     SHUNTS MV Area  VTI:   0.95 cm     Systemic VTI:  0.17 m MV Peak grad:  9.4 mmHg     Systemic Diam: 1.80 cm MV Mean grad:  4.0 mmHg MV Vmax:       1.53 m/s MV Vmean:      95.0 cm/s MV Decel Time: 279 msec MV E velocity: 89.10 cm/s MV A velocity: 124.00 cm/s MV E/A ratio:  0.72 Shaukat Khan Electronically signed by Adrian Blackwater Signature Date/Time: 05/12/2023/3:45:09 PM    Final    CT ANGIO HEAD NECK W WO CM  Result Date: 05/11/2023 CLINICAL DATA:  Follow-up examination for acute stroke. EXAM: CT ANGIOGRAPHY HEAD AND NECK WITH AND WITHOUT CONTRAST TECHNIQUE: Multidetector CT imaging of the head and neck was performed using the standard protocol during bolus administration of intravenous contrast. Multiplanar CT image reconstructions and MIPs were obtained to evaluate the vascular anatomy. Carotid stenosis measurements (when applicable) are obtained utilizing NASCET criteria, using the distal internal carotid diameter as the denominator. RADIATION DOSE REDUCTION: This exam was  performed according to the departmental dose-optimization program which includes automated exposure control, adjustment of the mA and/or kV according to patient size and/or use of iterative reconstruction technique. CONTRAST:  75mL OMNIPAQUE IOHEXOL 350 MG/ML SOLN COMPARISON:  CT and MRI from earlier the same day. FINDINGS: CTA NECK FINDINGS Aortic arch: Visualized aortic arch within normal limits for caliber with standard 3 vessel morphology. Moderate atheromatous change about the arch itself. No high-grade stenosis about the origin the great vessels. Right carotid system: Right common and internal carotid arteries are tortuous but patent without dissection. Sequelae of prior endarterectomy at the right carotid bulb without significant residual or recurrent stenosis. Left carotid system: Left common and internal carotid arteries are tortuous without dissection. Sequelae of prior endarterectomy the left carotid bulb without significant residual or recurrent stenosis. Vertebral arteries: Both vertebral arteries arise from the subclavian arteries. Atheromatous change at the origins of both vertebral arteries with moderate stenosis on the right. Small penetrating plaque noted at the origin of the left vertebral artery (series 6, image 252). Vertebral arteries patent distally without additional significant stenosis or evidence for dissection. Skeleton: No discrete or worrisome osseous lesions. Moderate spondylosis present at C5-6 and C6-7. Other neck: No other acute finding. Upper chest: No other acute finding. Review of the MIP images confirms the above findings CTA HEAD FINDINGS Anterior circulation: Atheromatous change seen about the carotid siphons with resultant severe stenosis at the supraclinoid right ICA, and more moderate stenosis about the contralateral supraclinoid left ICA. Left A1 segment dominant and widely patent. Right A1 hypoplastic and/or absent. Normal anterior communicating artery complex. Anterior  cerebral arteries patent without stenosis. No M1 stenosis or occlusion. Distal MCA branches perfused and symmetric. Distal small vessel atheromatous irregularity noted. Posterior circulation: Dominant left V4 segment patent without significant stenosis. Atheromatous irregularity within the right V4 segment without significant stenosis. Both PICA patent. Basilar patent without stenosis. Superior cerebral arteries patent bilaterally. Both PCAs primarily supplied via the basilar. Left PCA patent to its distal aspect without stenosis. Focal severe distal right P2 stenosis noted (series 9, image 18). Right PCA otherwise patent to its distal aspect. Venous sinuses: Patent allowing for timing the contrast bolus. Anatomic variants: As above.  No aneurysm. Review of the MIP images confirms the above findings IMPRESSION: 1. Negative CTA for large vessel occlusion or other emergent finding. 2. Sequelae of prior bilateral carotid endarterectomies without significant residual or recurrent stenosis. 3. Atheromatous change about the  carotid siphons with resultant severe stenosis at the supraclinoid right ICA, and more moderate stenosis about the contralateral supraclinoid left ICA. 4. Focal severe distal right P2 stenosis. 5. Atheromatous change at the origins of both vertebral arteries with moderate stenosis on the right. Small penetrating plaque at the origin of the left vertebral artery. 6. Diffuse tortuosity of the major arterial vasculature of the head and neck, suggesting chronic underlying hypertension. Aortic Atherosclerosis (ICD10-I70.0). Electronically Signed   By: Rise Mu M.D.   On: 05/11/2023 20:24   MR BRAIN WO CONTRAST  Result Date: 05/11/2023 EXAM: MRI HEAD WITHOUT CONTRAST TECHNIQUE: Multiplanar, multiecho pulse sequences of the brain and surrounding structures were obtained without intravenous contrast. COMPARISON:  CT head from today. FINDINGS: Brain: Acute infarct in the left pons. No acute  hemorrhage, hydrocephalus, extra-axial collection or mass lesion. Remote infarct in the right parietal lobe. Remote lacunar infarct in the right thalamus. Scattered T2/FLAIR hyperintensities in the white matter, compatible with chronic microvascular ischemic disease. Punctate foci of susceptibility artifact in the pons, compatible with prior microhemorrhages. Vascular: Major arterial flow voids are maintained at the skull base. Skull and upper cervical spine: Normal marrow signal. Sinuses/Orbits: Clear sinuses.  No acute orbital findings. Other: No mastoid effusions. IMPRESSION: 1. Acute infarct in the left pons. 2. Remote infarcts and chronic microvascular ischemic change. Electronically Signed   By: Feliberto Harts M.D.   On: 05/11/2023 18:21   CT HEAD WO CONTRAST  Result Date: 05/11/2023 CLINICAL DATA:  Acute right-sided weakness beginning yesterday. Acute neurologic deficit. EXAM: CT HEAD WITHOUT CONTRAST TECHNIQUE: Contiguous axial images were obtained from the base of the skull through the vertex without intravenous contrast. RADIATION DOSE REDUCTION: This exam was performed according to the departmental dose-optimization program which includes automated exposure control, adjustment of the mA and/or kV according to patient size and/or use of iterative reconstruction technique. COMPARISON:  02/15/2023 FINDINGS: Brain: No evidence of intracranial hemorrhage, acute infarction, hydrocephalus, extra-axial collection, or mass lesion/mass effect. Stable old right posterior parietal infarct. Stable old right thalamic lacunar infarct and chronic small vessel disease. Vascular:  No hyperdense vessel or other acute findings. Skull: No evidence of fracture or other significant bone abnormality. Sinuses/Orbits:  No acute findings. Other: None. IMPRESSION: No acute intracranial abnormality. Stable old right parietal infarct, old right thalamic lacunar infarct, and chronic small vessel disease. Electronically Signed    By: Danae Orleans M.D.   On: 05/11/2023 10:58    Microbiology: Recent Results (from the past 240 hour(s))  Urine Culture     Status: Abnormal   Collection Time: 05/12/23 10:45 AM   Specimen: Urine, Random  Result Value Ref Range Status   Specimen Description   Final    URINE, RANDOM Performed at [facility], [address], [place], [place] [address]    Special Requests   Final    NONE Reflexed from [id number] Performed at [facility], [address] [place], [place] [address]    Culture 60,000 COLONIES/mL ESCHERICHIA COLI (A)  Final   Report Status 05/14/2023 FINAL  Final   Organism ID, Bacteria ESCHERICHIA COLI (A)  Final      Susceptibility   Escherichia coli - MIC*    AMPICILLIN 4 SENSITIVE Sensitive     CEFAZOLIN <=4 SENSITIVE Sensitive     CEFEPIME <=0.12 SENSITIVE Sensitive     CEFTRIAXONE <=0.25 SENSITIVE Sensitive     CIPROFLOXACIN >=4 RESISTANT Resistant     GENTAMICIN <=1 SENSITIVE Sensitive  IMIPENEM <=0.25 SENSITIVE Sensitive     NITROFURANTOIN <=16 SENSITIVE Sensitive     TRIMETH/SULFA >=320 RESISTANT Resistant     AMPICILLIN/SULBACTAM <=2 SENSITIVE Sensitive     PIP/TAZO <=4 SENSITIVE Sensitive ug/mL    * 60,000 COLONIES/mL ESCHERICHIA COLI     Labs: CBC: Recent Labs  Lab 05/11/23 1011 05/13/23 0459 05/14/23 0451  WBC 4.2 4.8 4.5  NEUTROABS 1.9  --   --   HGB 11.8* 10.1* 9.4*  HCT 36.2 30.9* 29.5*  MCV 87.2 85.8 87.8  PLT 310 260 244   Basic Metabolic Panel: Recent Labs  Lab 05/08/23 1051 05/11/23 1011 05/12/23 0429 05/13/23 0459 05/14/23 0451  NA 141 139 142 143 141  K 4.7 3.7 3.6 3.9 3.8  CL 103 107 108 110 110  CO2 23 25 24 24 25   GLUCOSE 62* 85 72 132* 101*  BUN 23 20 23  28* 26*  CREATININE 0.98 0.90 0.99 0.77 0.78  CALCIUM 11.3* 10.3 10.2 10.1 9.8  MG  --   --   --  2.1 1.9  PHOS  --   --   --  3.6  --    Liver Function Tests: Recent Labs  Lab 05/08/23 1051 05/11/23 1011  AST 14 17  ALT 8 11  ALKPHOS  115 93  BILITOT 0.3 0.5  PROT 7.0 7.5  ALBUMIN 4.3 4.2   No results for input(s): "LIPASE", "AMYLASE" in the last 168 hours. No results for input(s): "AMMONIA" in the last 168 hours. Cardiac Enzymes: No results for input(s): "CKTOTAL", "CKMB", "CKMBINDEX", "TROPONINI" in the last 168 hours. BNP (last 3 results) No results for input(s): "BNP" in the last 8760 hours. CBG: Recent Labs  Lab 05/13/23 1710 05/13/23 2017 05/14/23 0800 05/14/23 0829 05/14/23 1217  GLUCAP 82 166* 68* 99 181*    Time spent: 35 minutes  Signed:  Gillis Santa  [facility]  05/14/2023 2:47 PM

## 2023-05-14 NOTE — TOC Transition Note (Signed)
Transition of Care [facility]) - CM/SW Discharge Note   Patient Details  Name: [name] MRN: [id number] Date of Birth: [birth year]  [facility] CM/SW Contact:  Allena Katz, LCSW Phone Number: 05/14/2023, 3:03 PM   Clinical Narrative:   Pt has orders to discharge home with [facility]. Morrie Sheldon with [facility] notified.     Final next level of care: Home w Home Health Services Barriers to Discharge: Barriers Resolved   Patient Goals and CMS Choice CMS Medicare.gov Compare Post Acute Care list provided to:: Patient Choice offered to / list presented to : Patient  Discharge Placement                         Discharge Plan and Services Additional resources added to the After Visit Summary for       Post Acute Care Choice: Durable Medical Equipment                      [facility] Agency: [facility] Date [facility] Agency Contacted: 05/13/23 Time HH Agency Contacted: 1000 Representative spoke with at [facility] Agency: Morrie Sheldon  Social Determinants of Health (SDOH) Interventions SDOH Screenings   Food Insecurity: No Food Insecurity (05/11/2023)  Housing: Low Risk  (05/11/2023)  Transportation Needs: No Transportation Needs (05/11/2023)  Utilities: Not At Risk (05/11/2023)  Tobacco Use: Medium Risk (05/11/2023)     Readmission Risk Interventions    05/13/2023    2:17 PM  Readmission Risk Prevention Plan  Transportation Screening Complete  PCP or Specialist Appt within 5-7 Days Complete  Home Care Screening Complete  Medication Review (RN CM) Complete

## 2023-05-14 NOTE — Inpatient Diabetes Management (Signed)
Inpatient Diabetes Program Recommendations  AACE/ADA: New Consensus Statement on Inpatient Glycemic Control (2015)  Target Ranges:  Prepandial:   less than 140 mg/dL      Peak postprandial:   less than 180 mg/dL (1-2 hours)      Critically ill patients:  140 - 180 mg/dL   Lab Results  Component Value Date   GLUCAP 99 05/14/2023   HGBA1C 6.8 (H) 05/08/2023    Latest Reference Range & Units 05/13/23 08:05 05/13/23 11:49 05/13/23 17:10 05/13/23 20:17 05/14/23 08:00 05/14/23 08:29  Glucose-Capillary 70 - 99 mg/dL 102 (H) 725 (H) 82 366 (H) 68 (L) 99  (H): Data is abnormally high (L): Data is abnormally low  Review of Glycemic Control  Diabetes history: DM2 Outpatient Diabetes medications: Tresiba 20 units daily Current orders for Inpatient glycemic control: Semglee 10 units, Novolog 0-9 units tid, 0-5 units hs  Inpatient Diabetes Program Recommendations:   Please consider: -Decrease Semglee to 8 units daily  Thank you, Darel Hong E. Norene Oliveri, RN, MSN, CDCES  Diabetes Coordinator Inpatient Glycemic Control Team Team Pager [phone] (8am-5pm) 05/14/2023 10:44 AM

## 2023-05-14 NOTE — Progress Notes (Addendum)
Occupational Therapy Treatment Patient Details Name: [name] MRN: [id number] DOB: 01/30/1956 Today's Date: 05/14/2023   History of present illness Pt is a 67 y/o F admitted on 05/11/23 after presenting with R sided weakness. Brain MRI showed acute infarct in the L pons. PMH: stroke, carotid artery stenosis s/p endarterectomy, HTN, HLD, CAD, DM, dCHF, depression with anxiety, migraine HA, kidney stones, Jehovah witness, R intertrochanteric femur fx s/p IM nail 02/15/23, peripheral neuropathy, MI   OT comments  Pt. reports that her right hand is doing better today. Pt. reports that she has been cleared to walk to and from the bathroom by herself, and did throughout the night using the RW as needed. Reviewed with the Pt. the HEP for hand strengthening with green theraputty, and BUE strengthening with red theraband for bilateral shoulder flexion,  horizontal shoulder abduction, external rotation, elbow flexion, and extension. Pt.'s BP range this morning 149/77 to 195/92. Pt.continues to benefit from OT services for ADL training, A/E training, UE there. Ex, neuromuscular re-education, and pt. education about home modification, and DME.       If plan is discharge home, recommend the following:  A little help with walking and/or transfers;A little help with bathing/dressing/bathroom;Assistance with cooking/housework;Assist for transportation;Help with stairs or ramp for entrance   Equipment Recommendations       Recommendations for Other Services Rehab consult    Precautions / Restrictions Precautions Precautions: Fall Precaution Comments: R hemiplegia Restrictions Weight Bearing Restrictions: No       Mobility Bed Mobility Overal bed mobility: Independent Bed Mobility: Sit to Supine     Supine to sit: Modified independent (Device/Increase time)          Transfers   Equipment used: Rolling walker (2 wheels) Transfers: Sit to/from Stand Sit to Stand: Modified independent  (Device/Increase time)                 Balance                                           ADL either performed or assessed with clinical judgement   ADL                                         General ADL Comments: Pt.is now walking to the bathroom with modified independence    Extremity/Trunk Assessment Upper Extremity Assessment Upper Extremity Assessment: Left hand dominant RUE Deficits / Details: Pt. is able to perform finger oppositione, mild FMC deficits, overall  strength conitnues to be 4-/5 RUE Coordination: decreased fine motor            Vision       Perception     Praxis      Cognition Arousal: Alert   Overall Cognitive Status: Within Functional Limits for tasks assessed                                          Exercises Other Exercises Other Exercises: Reviewed hand strengthening HEP with green theraputty. Pt. education was provided about a HEP for  BUE strengthening with red theraband.    Shoulder Instructions       General Comments  Pertinent Vitals/ Pain          Home Living                                          Prior Functioning/Environment              Frequency  Min 1X/week        Progress Toward Goals  OT Goals(current goals can now be found in the care plan section)  Progress towards OT goals: Progressing toward goals  Acute Rehab OT Goals Patient Stated Goal: To return home Time For Goal Achievement: 05/26/23 Potential to Achieve Goals: Good  Plan      Co-evaluation      Reason for Co-Treatment: For patient/therapist safety          AM-PAC OT "6 Clicks" Daily Activity     Outcome Measure   Help from another person eating meals?: None Help from another person taking care of personal grooming?: None Help from another person toileting, which includes using toliet, bedpan, or urinal?: A Little Help from another person bathing  (including washing, rinsing, drying)?: A Little Help from another person to put on and taking off regular upper body clothing?: A Little Help from another person to put on and taking off regular lower body clothing?: A Little 6 Click Score: 20    End of Session    OT Visit Diagnosis: Hemiplegia and hemiparesis Hemiplegia - dominant/non-dominant: Non-Dominant Hemiplegia - caused by: Cerebral infarction   Activity Tolerance     Patient Left     Nurse Communication          Time: 7829-5621 OT Time Calculation (min): 28 min  Charges: OT General Charges $OT Visit: 1 Visit OT Treatments $Therapeutic Exercise: 23-37 mins  Olegario Messier, MS, OTR/L   Olegario Messier 05/14/2023, 1:58 PM

## 2023-05-14 NOTE — Progress Notes (Signed)
Physical Therapy Treatment Patient Details Name: [name] MRN: [id number] DOB: 11/27/1955 Today's Date: 05/14/2023   History of Present Illness Pt is a 67 y/o F admitted on 05/11/23 after presenting with R sided weakness. Brain MRI showed acute infarct in the L pons. PMH: stroke, carotid artery stenosis s/p endarterectomy, HTN, HLD, CAD, DM, dCHF, depression with anxiety, migraine HA, kidney stones, Jehovah witness, R intertrochanteric femur fx s/p IM nail 02/15/23, peripheral neuropathy, MI    PT Comments  Pt alert and oriented, denies pain, and receptive to therapy session. Pt mod I with bed mobility, supervision for STS with RW, CGA for amb with RW, and minA for stair negotiation with RW. Pt reports that she has 24/7 assist at home that will help her with the 1 step she has to enter/exit her home. Noted increase in stability with mobility this session, but still requires cuing to maintain proper distance from RW with ambulation. Pt reporting that she prefers home health services rather than outpatient; pt would benefit from continued therapy services to maximize functional independence.   If plan is discharge home, recommend the following: Assist for transportation;Assistance with cooking/housework;Help with stairs or ramp for entrance;A little help with walking and/or transfers;A little help with bathing/dressing/bathroom   Can travel by private vehicle      yes  Equipment Recommendations  None recommended by PT    Recommendations for Other Services       Precautions / Restrictions Precautions Precautions: Fall Precaution Comments: R hemiplegia Restrictions Weight Bearing Restrictions: No     Mobility  Bed Mobility Overal bed mobility: Modified Independent Bed Mobility: Supine to Sit, Sit to Supine                Transfers Overall transfer level: Needs assistance Equipment used: Rolling walker (2 wheels) Transfers: Sit to/from Stand Sit to Stand: Supervision                 Ambulation/Gait Ambulation/Gait assistance: Contact guard assist Gait Distance (Feet): 200 Feet Assistive device: Rolling walker (2 wheels) Gait Pattern/deviations: Drifts right/left, Decreased step length - left Gait velocity: decreased     General Gait Details: no overt drifting with RW noted this session; increased stability; pt continues to need cuing to maintain proper distance from RW with ambulation   Stairs Stairs: Yes Stairs assistance: Min assist Stair Management: With walker Number of Stairs: 2 General stair comments: ascended/descended 1 platform step x 2 with RW per home setup; minA for R hemiplegia and sequencing cues   Wheelchair Mobility     Tilt Bed    Modified Rankin (Stroke Patients Only)       Balance Overall balance assessment: Needs assistance Sitting-balance support: Feet supported, No upper extremity supported Sitting balance-[name] Scale: Normal     Standing balance support: During functional activity, Bilateral upper extremity supported, Reliant on assistive device for balance Standing balance-[name] Scale: Fair Standing balance comment: increased stability this session with dynamic balance                            Cognition Arousal: Alert Behavior During Therapy: WFL for tasks assessed/performed Overall Cognitive Status: Within Functional Limits for tasks assessed                                          Exercises  General Comments        Pertinent Vitals/Pain Pain Assessment Pain Assessment: No/denies pain    Home Living                          Prior Function            PT Goals (current goals can now be found in the care plan section) Acute Rehab PT Goals Patient Stated Goal: get better PT Goal Formulation: With patient Time For Goal Achievement: 05/26/23 Potential to Achieve Goals: Good Progress towards PT goals: Progressing toward goals     Frequency    Min 1X/week      PT Plan      Co-evaluation              AM-PAC PT "6 Clicks" Mobility   Outcome Measure  Help needed turning from your back to your side while in a flat bed without using bedrails?: None Help needed moving from lying on your back to sitting on the side of a flat bed without using bedrails?: None Help needed moving to and from a bed to a chair (including a wheelchair)?: A Little Help needed standing up from a chair using your arms (e.g., wheelchair or bedside chair)?: A Little Help needed to walk in hospital room?: A Little Help needed climbing 3-5 steps with a railing? : A Little 6 Click Score: 20    End of Session Equipment Utilized During Treatment: Gait belt Activity Tolerance: Patient tolerated treatment well;No increased pain Patient left: in bed;with call bell/phone within reach (RN allowing her to go to the bathroom on her own) Nurse Communication: Mobility status PT Visit Diagnosis: Muscle weakness (generalized) (M62.81);Hemiplegia and hemiparesis;Unsteadiness on feet (R26.81);Difficulty in walking, not elsewhere classified (R26.2);Other abnormalities of gait and mobility (R26.89) Hemiplegia - Right/Left: Right Hemiplegia - dominant/non-dominant: Non-dominant Hemiplegia - caused by: Cerebral infarction     Time: 6578-4696 PT Time Calculation (min) (ACUTE ONLY): 18 min  Charges:    $Gait Training: 8-22 mins PT General Charges $$ ACUTE PT VISIT: 1 Visit                       Shauna Hugh, SPT 05/14/2023, 12:00 PM

## 2023-05-20 ENCOUNTER — Telehealth: Payer: Self-pay | Admitting: Cardiology

## 2023-05-20 NOTE — Telephone Encounter (Signed)
[name], PT with [facility], called needing verbal orders for PT 2w4, 1w4. Also wanted to report med interaction of atorvastatin and clopidogrel - per Amber these meds are okay to be taken together. Verbal orders also given to [employer] per [facility].

## 2023-05-30 ENCOUNTER — Telehealth: Payer: Self-pay

## 2023-06-07 ENCOUNTER — Ambulatory Visit (INDEPENDENT_AMBULATORY_CARE_PROVIDER_SITE_OTHER): Payer: Medicare (Managed Care) | Admitting: Cardiology

## 2023-06-07 ENCOUNTER — Encounter: Payer: Self-pay | Admitting: Cardiology

## 2023-06-07 VITALS — BP 100/70 | HR 99 | Ht 66.75 in | Wt 142.0 lb

## 2023-06-07 DIAGNOSIS — R3915 Urgency of urination: Secondary | ICD-10-CM

## 2023-06-07 DIAGNOSIS — E785 Hyperlipidemia, unspecified: Secondary | ICD-10-CM

## 2023-06-07 DIAGNOSIS — E1159 Type 2 diabetes mellitus with other circulatory complications: Secondary | ICD-10-CM | POA: Diagnosis not present

## 2023-06-07 DIAGNOSIS — I10 Essential (primary) hypertension: Secondary | ICD-10-CM

## 2023-06-07 DIAGNOSIS — Z794 Long term (current) use of insulin: Secondary | ICD-10-CM

## 2023-06-07 LAB — POCT URINALYSIS DIPSTICK
Bilirubin, UA: NEGATIVE
Blood, UA: POSITIVE
Glucose, UA: NEGATIVE
Ketones, UA: NEGATIVE
Nitrite, UA: POSITIVE
Protein, UA: NEGATIVE
Spec Grav, UA: 1.025 (ref 1.010–1.025)
Urobilinogen, UA: 0.2 U/dL
pH, UA: 6 (ref 5.0–8.0)

## 2023-06-07 MED ORDER — NITROFURANTOIN MONOHYD MACRO 100 MG PO CAPS: 100.0000 mg | ORAL_CAPSULE | Freq: Two times a day (BID) | ORAL | 0 refills | Status: DC

## 2023-06-07 MED ORDER — ATORVASTATIN CALCIUM 40 MG PO TABS: 40.0000 mg | ORAL_TABLET | Freq: Every day | ORAL | 1 refills | Status: DC

## 2023-06-07 NOTE — Progress Notes (Signed)
Established Patient Office Visit  Subjective:  Patient ID: [name], female    DOB: 1955-11-07  Age: 67 y.o. MRN: [id number]  Chief Complaint  Patient presents with   Follow-up    1 month follow up    Patient in office for 1 month follow up. Since previous visit, patient was in the hospital for a CVA, mild right sided deficit. Patient is doing PT and benefiting from it. Patient continues to have urinary urgency. Will check a UA today.  Patient reports occasional chest pain at night, after eating, she will reach out to her cardiologist.  Discussed recent lab results. Hgb A1c controlled on Tresiba. LDL elevated, chart states she is taking 20 mg atorvastatin, will increase to 40 mg.     No other concerns at this time.   Past Medical History:  Diagnosis Date   Anxiety    a. with claustrophobia   Arthritis    "right hand; maybe just starting in my left hand" (04/21/2012)   Asthma    CAD (coronary artery disease)    a. PCI [place] 2004. b. Angina - balloon angioplasty to LAD 04/21/12 (vessel too small for stent).   Carotid stenosis    a. 11/2011 critical R, ~30% L by angiogram;  b. 04/2012 s/p R CEA.   Cataract    left   Chronic lower back pain    Depression    a. occasionally takes st john's wart   Diabetes mellitus type 2, uncontrolled    Dysrhythmia    Embolic stroke (HCC)    a. 11/2011 MRI: mult small b/l infarcts c/w emboli;  b. 11/2011 MRA neck: high grade RICA stenosis;  c. 6/20013 TEE: EF 55-60% with mild LVH, severe MAC, < 1 cm calc mobile structure on the MV annulus, ? 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 Thought was RICA stenosis did not explain the b/l small strokes; concern for emboli from mobile MV struct;  d. 01/2012 Event Mon:  no AFib   History of echocardiogram    Echo 12/16: mild LVH, EF 55-60%, no RWMA, Gr 2 DD, trivial AI, MAC, mild MR, mild LAE   History of kidney stones    History of nuclear stress test    a. Myoview 12/16: EF 46%,  no ischemia, Intermediate Risk (low EF)   HLD (hyperlipidemia)    a. 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 visual changes   Myocardial infarction [facility])    prior to 2013   Peripheral neuropathy    Refusal of blood transfusions as patient is Jehovah's Witness    Stroke (HCC) 2015- dec   mini stroke   Uncontrolled type 2 diabetes mellitus with circulatory disorder, with long-term current use of insulin 05/10/2015    Past Surgical History:  Procedure Laterality Date   ANKLE SURGERY  2005   after fall down stairs, left with screws and plate in place   CARDIAC CATHETERIZATION  2004   WNL, no significant coronary disease   CARDIAC CATHETERIZATION  12/19/11   CAROTID ANGIOGRAM Bilateral 12/19/2011   Procedure: CAROTID ANGIOGRAM;  Surgeon: Nada Libman, MD;  Location: [facility] CATH LAB;  Service: Cardiovascular;  Laterality: Bilateral;   CAROTID ENDARTERECTOMY     CARPAL TUNNEL RELEASE  ~ 2005   bilateral   CATARACT EXTRACTION W/ INTRAOCULAR LENS IMPLANT  2012   right   CATARACT EXTRACTION [facility] Left 10/2015   Dr Noel Gerold  at [facility]   CORONARY ANGIOPLASTY  04/21/2012   95% stenosis mid LAD, vessel not large enough for stent, rec plavix/ASA for 1 mo   CORONARY ANGIOPLASTY WITH STENT PLACEMENT  2002   x2   ENDARTERECTOMY  05/09/2012   Procedure: ENDARTERECTOMY CAROTID;  Surgeon: Nada Libman, MD;  Location: [facility] OR;  Service: Vascular;  Laterality: Right;  Right carotd endarterectomy with bovine patch angioplasty   ENDARTERECTOMY Left 06/23/2015   Procedure: ENDARTERECTOMY CAROTID;  Surgeon: Nada Libman, MD;  Location: [facility] OR;  Service: Vascular;  Laterality: Left;   EYE SURGERY Left 04/2015   laser surgery,blood vessels rupturing   holter monitor  2013   WNL   INTRAMEDULLARY (IM) NAIL INTERTROCHANTERIC Right 02/15/2023   Procedure: INTRAMEDULLARY NAILING RIGHT HIP;  Surgeon: Roby Lofts, MD;  Location: [facility] OR;  Service: Orthopedics;   Laterality: Right;   KIDNEY STONE SURGERY     several times   LITHOTRIPSY     LOOP RECORDER INSERTION N/A 07/22/2019   Procedure: LOOP RECORDER INSERTION;  Surgeon: Marcina Millard, MD;  Location: [facility] INVASIVE CV LAB;  Service: Cardiovascular;  Laterality: N/A;   PARS PLANA VITRECTOMY Left 04/2015   [facility])   [facility] ANGIOPLASTY Left 06/23/2015   Procedure: PATCH ANGIOPLASTY USING 1cm x 4cm BOVINE PERICARDIAL PATCH;  Surgeon: Nada Libman, MD;  Location: [facility] OR;  Service: Vascular;  Laterality: Left;   PERCUTANEOUS CORONARY STENT INTERVENTION (PCI-S) N/A 04/21/2012   Procedure: PERCUTANEOUS CORONARY STENT INTERVENTION (PCI-S);  Surgeon: Kathleene Hazel, MD;  Location: [facility] CATH LAB;  Service: Cardiovascular;  Laterality: N/A;   TEE WITHOUT CARDIOVERSION  12/14/2011   Procedure: TRANSESOPHAGEAL ECHOCARDIOGRAM (TEE);  Surgeon: Laurey Morale, MD;  Location: [facility] ENDOSCOPY;  Service: Cardiovascular;  Laterality: N/A;   US ECHOCARDIOGRAPHY  2002   normal LV fxn, mild LVH, borderline L atrial size    Social History   Socioeconomic History   Marital status: Married    Spouse name: Not on file   Number of children: Not on file   Years of education: Not on file   Highest education level: Not on file  Occupational History   Not on file  Tobacco Use   Smoking status: Former    Current packs/day: 0.00    Types: Cigarettes    Quit date: 06/25/1972    Years since quitting: 50.9   Smokeless tobacco: Never  Substance and Sexual Activity   Alcohol use: No    Comment: none since 11/2011   Drug use: No   Sexual activity: Yes    Birth control/protection: Post-menopausal  Other Topics Concern   Not on file  Social History Narrative   Caffeine: 2 cups/day coffee   Lives alone, husband in [place].  4 children nearby, son to live with her.   Occupation: unemployed, prior worked as IT consultant   Activity: stays active on farm   Diet: vegan, no processed foods, good water daily,  fruits/vegetables daily   jehovah's witness - no blood products.   Social Drivers of Corporate investment banker Strain: Not on file  Food Insecurity: No Food Insecurity (05/11/2023)   Hunger Vital Sign    Worried About Running Out of Food in the Last Year: Never true    Ran Out of Food in the Last Year: Never true  Transportation Needs: No Transportation Needs (05/11/2023)   PRAPARE - Administrator, Civil Service (Medical): No    Lack of Transportation (Non-Medical): No  Physical Activity: Not on file  Stress: Not on file  Social Connections: Not on file  Intimate Partner Violence: Not At Risk (05/11/2023)   Humiliation, Afraid, Rape, and Kick questionnaire    Fear of Current or Ex-Partner: No    Emotionally Abused: No    Physically Abused: No    Sexually Abused: No    Family History  Problem Relation Age of Onset   Heart disease Mother        valve problems   Stroke Mother    Hypertension Mother    Heart disease Sister        valve problems   Heart disease Brother    Diabetes Father    Hypertension Father    AAA (abdominal aortic aneurysm) Father    Cancer Neg Hx     Allergies  Allergen Reactions   Other Shortness Of Breath    No blood or blood products   Penicillins Itching and Rash    Rash occurred ass a teenager -- took for strep throat.   Toradol [Ketorolac Tromethamine] Other (See Comments)    Panic attack    Imdur [Isosorbide Nitrate] Other (See Comments)    Headaches    Latex Rash    Rash on all points of contact (thick, heavy itching rash)   Metformin And Related Diarrhea and Nausea And Vomiting    Outpatient Medications Prior to Visit  Medication Sig   aspirin EC 81 MG tablet Take 81 mg by mouth daily.   clopidogrel (PLAVIX) 75 MG tablet Take 75 mg by mouth daily.   insulin degludec (TRESIBA FLEXTOUCH) 200 UNIT/ML FlexTouch Pen Inject 20 Units into the skin at bedtime.   losartan (COZAAR) 100 MG tablet Take 100 mg by mouth daily.    meclizine (ANTIVERT) 12.5 MG tablet Take 1 tablet (12.5 mg total) by mouth 3 (three) times daily as needed for dizziness.   [DISCONTINUED] atorvastatin (LIPITOR) 40 MG tablet Take 1 tablet (40 mg total) by mouth daily at 6 PM. (Patient taking differently: Take 20 mg by mouth daily.)   No facility-administered medications prior to visit.    Review of Systems  Constitutional: Negative.   HENT: Negative.    Eyes: Negative.   Respiratory: Negative.  Negative for shortness of breath.   Cardiovascular:  Positive for chest pain.  Gastrointestinal: Negative.  Negative for abdominal pain, constipation and diarrhea.  Genitourinary:  Positive for urgency.  Musculoskeletal:  Negative for joint pain and myalgias.  Skin: Negative.   Neurological: Negative.  Negative for dizziness and headaches.  Endo/Heme/Allergies: Negative.   All other systems reviewed and are negative.      Objective:   BP 100/70   Pulse 99   Ht 5' 6.75" (1.695 m)   Wt 142 lb (64.4 kg)   SpO2 100%   BMI 22.41 kg/m   Vitals:   06/07/23 0951  BP: 100/70  Pulse: 99  Height: 5' 6.75" (1.695 m)  Weight: 142 lb (64.4 kg)  SpO2: 100%  BMI (Calculated): 22.42    Physical Exam Vitals and nursing note reviewed.  Constitutional:      Appearance: Normal appearance. She is normal weight.  HENT:     Head: Normocephalic and atraumatic.     Nose: Nose normal.     Mouth/Throat:     Mouth: Mucous membranes are moist.  Eyes:     Extraocular Movements: Extraocular movements intact.     Conjunctiva/sclera: Conjunctivae normal.     Pupils: Pupils are equal, round, and  reactive to light.  Cardiovascular:     Rate and Rhythm: Normal rate and regular rhythm.     Pulses: Normal pulses.     Heart sounds: Normal heart sounds.  Pulmonary:     Effort: Pulmonary effort is normal.     Breath sounds: Normal breath sounds.  Abdominal:     General: Abdomen is flat. Bowel sounds are normal.     Palpations: Abdomen is soft.   Musculoskeletal:        General: Normal range of motion.     Cervical back: Normal range of motion.  Skin:    General: Skin is warm and dry.  Neurological:     General: No focal deficit present.     Mental Status: She is alert and oriented to person, place, and time.  Psychiatric:        Mood and Affect: Mood normal.        Behavior: Behavior normal.        Thought Content: Thought content normal.        Judgment: Judgment normal.      Results for orders placed or performed in visit on 06/07/23  POCT Urinalysis Dipstick (81002)  Result Value Ref Range   Color, UA yellow    Clarity, UA cloudy    Glucose, UA Negative Negative   Bilirubin, UA neg    Ketones, UA neg    Spec Grav, UA 1.025 1.010 - 1.025   Blood, UA pos    pH, UA 6.0 5.0 - 8.0   Protein, UA Negative Negative   Urobilinogen, UA 0.2 0.2 or 1.0 E.U./dL   Nitrite, UA pos    Leukocytes, UA Moderate (2+) (A) Negative   Appearance cloudy    Odor yes     Recent Results (from the past 2160 hours)  CMP14+EGFR     Status: Abnormal   Collection Time: 05/08/23 10:51 AM  Result Value Ref Range   Glucose 62 (L) 70 - 99 mg/dL   BUN 23 8 - 27 mg/dL   Creatinine, Ser 1.66 0.57 - 1.00 mg/dL   eGFR 63 >06 TK/ZSW/1.09   BUN/Creatinine Ratio 23 12 - 28   Sodium 141 134 - 144 mmol/L   Potassium 4.7 3.5 - 5.2 mmol/L   Chloride 103 96 - 106 mmol/L   CO2 23 20 - 29 mmol/L   Calcium 11.3 (H) 8.7 - 10.3 mg/dL   Total Protein 7.0 6.0 - 8.5 g/dL   Albumin 4.3 3.9 - 4.9 g/dL   Globulin, Total 2.7 1.5 - 4.5 g/dL   Bilirubin Total 0.3 0.0 - 1.2 mg/dL   Alkaline Phosphatase 115 44 - 121 IU/L   AST 14 0 - 40 IU/L   ALT 8 0 - 32 IU/L  Lipid Profile     Status: Abnormal   Collection Time: 05/08/23 10:51 AM  Result Value Ref Range   Cholesterol, Total 194 100 - 199 mg/dL   Triglycerides 84 0 - 149 mg/dL   HDL 60 >32 mg/dL   VLDL Cholesterol Cal 15 5 - 40 mg/dL   LDL Chol Calc (NIH) 355 (H) 0 - 99 mg/dL   Chol/HDL Ratio 3.2 0.0  - 4.4 ratio    Comment:                                   T. Chol/HDL Ratio  Men  Women                               1/2 Avg.Risk  3.4    3.3                                   Avg.Risk  5.0    4.4                                2X Avg.Risk  9.6    7.1                                3X Avg.Risk 23.4   11.0   Hemoglobin A1c     Status: Abnormal   Collection Time: 05/08/23 10:51 AM  Result Value Ref Range   Hgb A1c MFr Bld 6.8 (H) 4.8 - 5.6 %    Comment:          Prediabetes: 5.7 - 6.4          Diabetes: >6.4          Glycemic control for adults with diabetes: <7.0    Est. average glucose Bld gHb Est-mCnc 148 mg/dL  TSH     Status: None   Collection Time: 05/08/23 10:51 AM  Result Value Ref Range   TSH 1.190 0.450 - 4.500 uIU/mL  POCT Urinalysis Dipstick (32951)     Status: Abnormal   Collection Time: 05/08/23 10:52 AM  Result Value Ref Range   Color, UA yellow    Clarity, UA cloudy    Glucose, UA Negative Negative   Bilirubin, UA neg    Ketones, UA neg    Spec Grav, UA 1.025 1.010 - 1.025   Blood, UA pos    pH, UA 6.0 5.0 - 8.0   Protein, UA Negative Negative   Urobilinogen, UA 0.2 0.2 or 1.0 E.U./dL   Nitrite, UA pos    Leukocytes, UA Moderate (2+) (A) Negative   Appearance cloudy    Odor yes   Protime-INR     Status: None   Collection Time: 05/11/23 10:11 AM  Result Value Ref Range   Prothrombin Time 14.3 11.4 - 15.2 seconds   INR 1.1 0.8 - 1.2    Comment: (NOTE) INR goal varies based on device and disease states. Performed at [facility], [address] [place], [place] [address]   APTT     Status: None   Collection Time: 05/11/23 10:11 AM  Result Value Ref Range   aPTT 32 24 - 36 seconds    Comment: Performed at [facility], [address] [place], [place] [address]  CBC     Status: Abnormal   Collection Time: 05/11/23 10:11 AM  Result Value Ref Range   WBC 4.2 4.0 - 10.5 K/uL   RBC 4.15  3.87 - 5.11 MIL/uL   Hemoglobin 11.8 (L) 12.0 - 15.0 g/dL   HCT 16.0 10.9 - 32.3 %   MCV 87.2 80.0 - 100.0 fL   MCH 28.4 26.0 - 34.0 pg   MCHC 32.6 30.0 - 36.0 g/dL   RDW 55.7 32.2 - 02.5 %   Platelets 310 150 - 400 K/uL   nRBC 0.0 0.0 - 0.2 %  Comment: Performed at [facility], [address] [place], [place] [address]  Differential     Status: None   Collection Time: 05/11/23 10:11 AM  Result Value Ref Range   Neutrophils Relative % 46 %   Neutro Abs 1.9 1.7 - 7.7 K/uL   Lymphocytes Relative 41 %   Lymphs Abs 1.7 0.7 - 4.0 K/uL   Monocytes Relative 8 %   Monocytes Absolute 0.3 0.1 - 1.0 K/uL   Eosinophils Relative 4 %   Eosinophils Absolute 0.2 0.0 - 0.5 K/uL   Basophils Relative 1 %   Basophils Absolute 0.0 0.0 - 0.1 K/uL   Immature Granulocytes 0 %   Abs Immature Granulocytes 0.01 0.00 - 0.07 K/uL    Comment: Performed at [facility], [address] [place], [place] [address]  Comprehensive metabolic panel     Status: None   Collection Time: 05/11/23 10:11 AM  Result Value Ref Range   Sodium 139 135 - 145 mmol/L   Potassium 3.7 3.5 - 5.1 mmol/L   Chloride 107 98 - 111 mmol/L   CO2 25 22 - 32 mmol/L   Glucose, Bld 85 70 - 99 mg/dL    Comment: Glucose reference range applies only to samples taken after fasting for at least 8 hours.   BUN 20 8 - 23 mg/dL   Creatinine, Ser 3.87 0.44 - 1.00 mg/dL   Calcium 56.4 8.9 - 33.2 mg/dL   Total Protein 7.5 6.5 - 8.1 g/dL   Albumin 4.2 3.5 - 5.0 g/dL   AST 17 15 - 41 U/L   ALT 11 0 - 44 U/L   Alkaline Phosphatase 93 38 - 126 U/L   Total Bilirubin 0.5 <1.2 mg/dL   GFR, Estimated >95 >18 mL/min    Comment: (NOTE) Calculated using the CKD-EPI Creatinine Equation (2021)    Anion gap 7 5 - 15    Comment: Performed at [facility], [address] [place], [place] [address]  Glucose, capillary     Status: None   Collection Time: 05/11/23  9:51 PM  Result Value Ref Range   Glucose-Capillary 76 70  - 99 mg/dL    Comment: Glucose reference range applies only to samples taken after fasting for at least 8 hours.  Lipid panel     Status: Abnormal   Collection Time: 05/12/23  4:29 AM  Result Value Ref Range   Cholesterol 176 0 - 200 mg/dL   Triglycerides 67 <063 mg/dL   HDL 48 >01 mg/dL   Total CHOL/HDL Ratio 3.7 RATIO   VLDL 13 0 - 40 mg/dL   LDL Cholesterol 601 (H) 0 - 99 mg/dL    Comment:        Total Cholesterol/HDL:CHD Risk Coronary Heart Disease Risk Table                     Men   Women  1/2 Average Risk   3.4   3.3  Average Risk       5.0   4.4  2 X Average Risk   9.6   7.1  3 X Average Risk  23.4   11.0        Use the calculated Patient Ratio above and the CHD Risk Table to determine the patient's CHD Risk.        ATP III CLASSIFICATION (LDL):  <100     mg/dL   Optimal  093-235  mg/dL   Near or Above  Optimal  130-159  mg/dL   Borderline  811-914  mg/dL   High  >782     mg/dL   Very High Performed at [facility], [address] [place], [place] [address]   Basic metabolic panel     Status: None   Collection Time: 05/12/23  4:29 AM  Result Value Ref Range   Sodium 142 135 - 145 mmol/L   Potassium 3.6 3.5 - 5.1 mmol/L   Chloride 108 98 - 111 mmol/L   CO2 24 22 - 32 mmol/L   Glucose, Bld 72 70 - 99 mg/dL    Comment: Glucose reference range applies only to samples taken after fasting for at least 8 hours.   BUN 23 8 - 23 mg/dL   Creatinine, Ser 3.08 0.44 - 1.00 mg/dL   Calcium 65.7 8.9 - 84.6 mg/dL   GFR, Estimated >96 >29 mL/min    Comment: (NOTE) Calculated using the CKD-EPI Creatinine Equation (2021)    Anion gap 10 5 - 15    Comment: Performed at [facility], [address] [place], [place] [address]  Glucose, capillary     Status: Abnormal   Collection Time: 05/12/23  4:59 AM  Result Value Ref Range   Glucose-Capillary 66 (L) 70 - 99 mg/dL    Comment: Glucose reference range applies only to samples taken after  fasting for at least 8 hours.  Glucose, capillary     Status: Abnormal   Collection Time: 05/12/23  5:30 AM  Result Value Ref Range   Glucose-Capillary 68 (L) 70 - 99 mg/dL    Comment: Glucose reference range applies only to samples taken after fasting for at least 8 hours.  Glucose, capillary     Status: None   Collection Time: 05/12/23  5:54 AM  Result Value Ref Range   Glucose-Capillary 77 70 - 99 mg/dL    Comment: Glucose reference range applies only to samples taken after fasting for at least 8 hours.  Glucose, capillary     Status: Abnormal   Collection Time: 05/12/23  9:14 AM  Result Value Ref Range   Glucose-Capillary 124 (H) 70 - 99 mg/dL    Comment: Glucose reference range applies only to samples taken after fasting for at least 8 hours.  Urinalysis, w/ Reflex to Culture (Infection Suspected) -Urine, Clean Catch     Status: Abnormal   Collection Time: 05/12/23 10:45 AM  Result Value Ref Range   Specimen Source URINE, CLEAN CATCH    Color, Urine YELLOW (A) YELLOW   APPearance CLOUDY (A) CLEAR   Specific Gravity, Urine 1.034 (H) 1.005 - 1.030   pH 6.0 5.0 - 8.0   Glucose, UA NEGATIVE NEGATIVE mg/dL   Hgb urine dipstick NEGATIVE NEGATIVE   Bilirubin Urine NEGATIVE NEGATIVE   Ketones, ur NEGATIVE NEGATIVE mg/dL   Protein, ur 30 (A) NEGATIVE mg/dL   Nitrite NEGATIVE NEGATIVE   Leukocytes,Ua LARGE (A) NEGATIVE   RBC / HPF 0-5 0 - 5 RBC/hpf   WBC, UA >50 0 - 5 WBC/hpf    Comment:        Reflex urine culture not performed if WBC <=10, OR if Squamous epithelial cells >5. If Squamous epithelial cells >5 suggest recollection.    Bacteria, UA RARE (A) NONE SEEN   Squamous Epithelial / HPF 0-5 0 - 5 /HPF   Mucus PRESENT    Hyaline Casts, UA PRESENT     Comment: Performed at [facility], [address]  [place], [place] [address]  Urine Culture     Status: Abnormal   Collection Time: 05/12/23 10:45 AM   Specimen: Urine, Random  Result Value Ref Range   Specimen  Description      URINE, RANDOM Performed at [facility], [address] [place], [place] [address]    Special Requests      NONE Reflexed from [id number] Performed at [facility], [address] [place], [place] [address]    Culture 60,000 COLONIES/mL ESCHERICHIA COLI (A)    Report Status 05/14/2023 FINAL    Organism ID, Bacteria ESCHERICHIA COLI (A)       Susceptibility   Escherichia coli - MIC*    AMPICILLIN 4 SENSITIVE Sensitive     CEFAZOLIN <=4 SENSITIVE Sensitive     CEFEPIME <=0.12 SENSITIVE Sensitive     CEFTRIAXONE <=0.25 SENSITIVE Sensitive     CIPROFLOXACIN >=4 RESISTANT Resistant     GENTAMICIN <=1 SENSITIVE Sensitive     IMIPENEM <=0.25 SENSITIVE Sensitive     NITROFURANTOIN <=16 SENSITIVE Sensitive     TRIMETH/SULFA >=320 RESISTANT Resistant     AMPICILLIN/SULBACTAM <=2 SENSITIVE Sensitive     PIP/TAZO <=4 SENSITIVE Sensitive ug/mL    * 60,000 COLONIES/mL ESCHERICHIA COLI  ECHOCARDIOGRAM COMPLETE     Status: None   Collection Time: 05/12/23 11:54 AM  Result Value Ref Range   Weight 2,289.6 oz   Height 66.5 in   BP 100/55 mmHg   Ao pk vel 1.17 m/s   AR max vel 1.87 cm2   AV Peak grad 5.5 mmHg   S' Lateral 3.10 cm   Area-P 1/2 2.72 cm2   MV VTI 0.95 cm2   Est EF 65 - 70%   Glucose, capillary     Status: Abnormal   Collection Time: 05/12/23 12:21 PM  Result Value Ref Range   Glucose-Capillary 138 (H) 70 - 99 mg/dL    Comment: Glucose reference range applies only to samples taken after fasting for at least 8 hours.  Glucose, capillary     Status: Abnormal   Collection Time: 05/12/23  4:50 PM  Result Value Ref Range   Glucose-Capillary 185 (H) 70 - 99 mg/dL    Comment: Glucose reference range applies only to samples taken after fasting for at least 8 hours.  Glucose, capillary     Status: Abnormal   Collection Time: 05/12/23  9:17 PM  Result Value Ref Range   Glucose-Capillary 170 (H) 70 - 99 mg/dL    Comment: Glucose reference range  applies only to samples taken after fasting for at least 8 hours.  Basic metabolic panel     Status: Abnormal   Collection Time: 05/13/23  4:59 AM  Result Value Ref Range   Sodium 143 135 - 145 mmol/L   Potassium 3.9 3.5 - 5.1 mmol/L   Chloride 110 98 - 111 mmol/L   CO2 24 22 - 32 mmol/L   Glucose, Bld 132 (H) 70 - 99 mg/dL    Comment: Glucose reference range applies only to samples taken after fasting for at least 8 hours.   BUN 28 (H) 8 - 23 mg/dL   Creatinine, Ser 9.56 0.44 - 1.00 mg/dL   Calcium 21.3 8.9 - 08.6 mg/dL   GFR, Estimated >57 >84 mL/min    Comment: (NOTE) Calculated using the CKD-EPI Creatinine Equation (2021)    Anion gap 9 5 - 15    Comment: Performed at [facility], [address] [place], [place] [address]  CBC     Status: Abnormal   Collection Time: 05/13/23  4:59 AM  Result Value Ref Range   WBC 4.8 4.0 - 10.5 K/uL   RBC 3.60 (L) 3.87 - 5.11 MIL/uL   Hemoglobin 10.1 (L) 12.0 - 15.0 g/dL   HCT 65.7 (L) 84.6 - 96.2 %   MCV 85.8 80.0 - 100.0 fL   MCH 28.1 26.0 - 34.0 pg   MCHC 32.7 30.0 - 36.0 g/dL   RDW 95.2 84.1 - 32.4 %   Platelets 260 150 - 400 K/uL   nRBC 0.0 0.0 - 0.2 %    Comment: Performed at [facility], [address], [place], [place] [address]  Magnesium     Status: None   Collection Time: 05/13/23  4:59 AM  Result Value Ref Range   Magnesium 2.1 1.7 - 2.4 mg/dL    Comment: Performed at [facility], [address], [place], [place] [address]  Phosphorus     Status: None   Collection Time: 05/13/23  4:59 AM  Result Value Ref Range   Phosphorus 3.6 2.5 - 4.6 mg/dL    Comment: Performed at [facility], [address] [place], [place] [address]  Glucose, capillary     Status: Abnormal   Collection Time: 05/13/23  8:05 AM  Result Value Ref Range   Glucose-Capillary 108 (H) 70 - 99 mg/dL    Comment: Glucose reference range applies only to samples taken after fasting for at least 8 hours.  Glucose,  capillary     Status: Abnormal   Collection Time: 05/13/23 11:49 AM  Result Value Ref Range   Glucose-Capillary 189 (H) 70 - 99 mg/dL    Comment: Glucose reference range applies only to samples taken after fasting for at least 8 hours.  Glucose, capillary     Status: None   Collection Time: 05/13/23  5:10 PM  Result Value Ref Range   Glucose-Capillary 82 70 - 99 mg/dL    Comment: Glucose reference range applies only to samples taken after fasting for at least 8 hours.  Glucose, capillary     Status: Abnormal   Collection Time: 05/13/23  8:17 PM  Result Value Ref Range   Glucose-Capillary 166 (H) 70 - 99 mg/dL    Comment: Glucose reference range applies only to samples taken after fasting for at least 8 hours.  Basic metabolic panel     Status: Abnormal   Collection Time: 05/14/23  4:51 AM  Result Value Ref Range   Sodium 141 135 - 145 mmol/L   Potassium 3.8 3.5 - 5.1 mmol/L   Chloride 110 98 - 111 mmol/L   CO2 25 22 - 32 mmol/L   Glucose, Bld 101 (H) 70 - 99 mg/dL    Comment: Glucose reference range applies only to samples taken after fasting for at least 8 hours.   BUN 26 (H) 8 - 23 mg/dL   Creatinine, Ser 4.74 0.44 - 1.00 mg/dL   Calcium 9.8 8.9 - 25.9 mg/dL   GFR, Estimated >56 >38 mL/min    Comment: (NOTE) Calculated using the CKD-EPI Creatinine Equation (2021)    Anion gap 6 5 - 15    Comment: Performed at [facility], [address] [place], [place] [address]  CBC     Status: Abnormal   Collection Time: 05/14/23  4:51 AM  Result Value Ref Range   WBC 4.5 4.0 - 10.5 K/uL   RBC 3.36 (L) 3.87 - 5.11 MIL/uL  Hemoglobin 9.4 (L) 12.0 - 15.0 g/dL   HCT 63.0 (L) 16.0 - 10.9 %   MCV 87.8 80.0 - 100.0 fL   MCH 28.0 26.0 - 34.0 pg   MCHC 31.9 30.0 - 36.0 g/dL   RDW 32.3 55.7 - 32.2 %   Platelets 244 150 - 400 K/uL   nRBC 0.0 0.0 - 0.2 %    Comment: Performed at [facility], [address], [place], [place] [address]  Magnesium     Status: None    Collection Time: 05/14/23  4:51 AM  Result Value Ref Range   Magnesium 1.9 1.7 - 2.4 mg/dL    Comment: Performed at [facility], [address] [place], [place] [address]  Iron and TIBC     Status: None   Collection Time: 05/14/23  4:51 AM  Result Value Ref Range   Iron 49 28 - 170 ug/dL   TIBC 762 831 - 517 ug/dL   Saturation Ratios 17 10.4 - 31.8 %   UIBC 237 ug/dL    Comment: Performed at [facility], [address] [place], [place] [address]  Glucose, capillary     Status: Abnormal   Collection Time: 05/14/23  8:00 AM  Result Value Ref Range   Glucose-Capillary 68 (L) 70 - 99 mg/dL    Comment: Glucose reference range applies only to samples taken after fasting for at least 8 hours.  Glucose, capillary     Status: None   Collection Time: 05/14/23  8:29 AM  Result Value Ref Range   Glucose-Capillary 99 70 - 99 mg/dL    Comment: Glucose reference range applies only to samples taken after fasting for at least 8 hours.  Glucose, capillary     Status: Abnormal   Collection Time: 05/14/23 12:17 PM  Result Value Ref Range   Glucose-Capillary 181 (H) 70 - 99 mg/dL    Comment: Glucose reference range applies only to samples taken after fasting for at least 8 hours.  POCT Urinalysis Dipstick (37106)     Status: Abnormal   Collection Time: 06/07/23 10:28 AM  Result Value Ref Range   Color, UA yellow    Clarity, UA cloudy    Glucose, UA Negative Negative   Bilirubin, UA neg    Ketones, UA neg    Spec Grav, UA 1.025 1.010 - 1.025   Blood, UA pos    pH, UA 6.0 5.0 - 8.0   Protein, UA Negative Negative   Urobilinogen, UA 0.2 0.2 or 1.0 E.U./dL   Nitrite, UA pos    Leukocytes, UA Moderate (2+) (A) Negative   Appearance cloudy    Odor yes       Assessment & Plan:  UA abnormal, will send in antibiotic and send urine for culture. Macrobid. Increase atorvastatin to 40 mg.  Schedule an appointment with your cardiologist.   Problem List Items Addressed This  Visit       Cardiovascular and Mediastinum   HTN (hypertension) - Primary   Relevant Medications   atorvastatin (LIPITOR) 40 MG tablet     Endocrine   DM (diabetes mellitus), type 2 (HCC)   Relevant Medications   atorvastatin (LIPITOR) 40 MG tablet     Other   HLD (hyperlipidemia)   Relevant Medications   atorvastatin (LIPITOR) 40 MG tablet   Other Visit Diagnoses       Urinary urgency       Relevant Orders   POCT Urinalysis Dipstick (26948) (Completed)  Culture, Urine       Return in about 10 weeks (around 08/16/2023) for with fasting labs prior.   Total time spent: 25 minutes  [employer], NP  06/07/2023   This document may have been prepared by Dragon Voice Recognition software and as such may include unintentional dictation errors.

## 2023-06-10 ENCOUNTER — Other Ambulatory Visit: Payer: Self-pay

## 2023-06-10 ENCOUNTER — Emergency Department (HOSPITAL_COMMUNITY)
Admission: EM | Admit: 2023-06-10 | Discharge: 2023-06-10 | Disposition: A | Discharge status: Home / Self Care | Payer: Medicare (Managed Care) | Attending: Emergency Medicine | Admitting: Emergency Medicine

## 2023-06-10 DIAGNOSIS — N3000 Acute cystitis without hematuria: Secondary | ICD-10-CM | POA: Diagnosis not present

## 2023-06-10 DIAGNOSIS — Z7982 Long term (current) use of aspirin: Secondary | ICD-10-CM | POA: Insufficient documentation

## 2023-06-10 DIAGNOSIS — E11649 Type 2 diabetes mellitus with hypoglycemia without coma: Secondary | ICD-10-CM | POA: Insufficient documentation

## 2023-06-10 DIAGNOSIS — E162 Hypoglycemia, unspecified: Secondary | ICD-10-CM | POA: Diagnosis present

## 2023-06-10 DIAGNOSIS — Z794 Long term (current) use of insulin: Secondary | ICD-10-CM | POA: Insufficient documentation

## 2023-06-10 DIAGNOSIS — Z91040 Latex allergy status: Secondary | ICD-10-CM | POA: Diagnosis not present

## 2023-06-10 DIAGNOSIS — Z7901 Long term (current) use of anticoagulants: Secondary | ICD-10-CM | POA: Diagnosis not present

## 2023-06-10 LAB — CBG MONITORING, ED
Glucose-Capillary: 111 mg/dL — ABNORMAL HIGH (ref 70–99)
Glucose-Capillary: 128 mg/dL — ABNORMAL HIGH (ref 70–99)
Glucose-Capillary: 109 mg/dL — ABNORMAL HIGH (ref 70–99)

## 2023-06-10 LAB — URINALYSIS, ROUTINE W REFLEX MICROSCOPIC
Bilirubin Urine: NEGATIVE
Glucose, UA: NEGATIVE mg/dL
Hgb urine dipstick: NEGATIVE
Ketones, ur: NEGATIVE mg/dL
Nitrite: NEGATIVE
Protein, ur: NEGATIVE mg/dL
Specific Gravity, Urine: 1.018 (ref 1.005–1.030)
pH: 5 (ref 5.0–8.0)

## 2023-06-10 LAB — CBC
HCT: 35.6 % — ABNORMAL LOW (ref 36.0–46.0)
Hemoglobin: 11.2 g/dL — ABNORMAL LOW (ref 12.0–15.0)
MCH: 27.9 pg (ref 26.0–34.0)
MCHC: 31.5 g/dL (ref 30.0–36.0)
MCV: 88.6 fL (ref 80.0–100.0)
Platelets: 327 10*3/uL (ref 150–400)
RBC: 4.02 MIL/uL (ref 3.87–5.11)
RDW: 13 % (ref 11.5–15.5)
WBC: 6.2 10*3/uL (ref 4.0–10.5)
nRBC: 0 % (ref 0.0–0.2)

## 2023-06-10 LAB — I-STAT CHEM 8, ED
BUN: 29 mg/dL — ABNORMAL HIGH (ref 8–23)
Calcium, Ion: 1.31 mmol/L (ref 1.15–1.40)
Chloride: 111 mmol/L (ref 98–111)
Creatinine, Ser: 0.9 mg/dL (ref 0.44–1.00)
Glucose, Bld: 121 mg/dL — ABNORMAL HIGH (ref 70–99)
HCT: 33 % — ABNORMAL LOW (ref 36.0–46.0)
Hemoglobin: 11.2 g/dL — ABNORMAL LOW (ref 12.0–15.0)
Potassium: 4.8 mmol/L (ref 3.5–5.1)
Sodium: 143 mmol/L (ref 135–145)
TCO2: 27 mmol/L (ref 22–32)

## 2023-06-10 LAB — COMPREHENSIVE METABOLIC PANEL
ALT: 10 U/L (ref 0–44)
AST: 18 U/L (ref 15–41)
Albumin: 3.6 g/dL (ref 3.5–5.0)
Alkaline Phosphatase: 85 U/L (ref 38–126)
Anion gap: 8 (ref 5–15)
BUN: 25 mg/dL — ABNORMAL HIGH (ref 8–23)
CO2: 24 mmol/L (ref 22–32)
Calcium: 10.5 mg/dL — ABNORMAL HIGH (ref 8.9–10.3)
Chloride: 112 mmol/L — ABNORMAL HIGH (ref 98–111)
Creatinine, Ser: 0.93 mg/dL (ref 0.44–1.00)
GFR, Estimated: >60 mL/min (ref >60)
Glucose, Bld: 126 mg/dL — ABNORMAL HIGH (ref 70–99)
Potassium: 4.8 mmol/L (ref 3.5–5.1)
Sodium: 144 mmol/L (ref 135–145)
Total Bilirubin: 0.6 mg/dL (ref <1.2)
Total Protein: 6.7 g/dL (ref 6.5–8.1)

## 2023-06-10 MED ORDER — CEPHALEXIN 500 MG PO CAPS: 500.0000 mg | ORAL_CAPSULE | Freq: Four times a day (QID) | ORAL | 0 refills | Status: DC

## 2023-06-10 MED ORDER — SODIUM CHLORIDE 0.9% FLUSH
3.0000 mL | INTRAVENOUS | Status: DC | PRN
  Administered 2023-06-10: 3 mL via INTRAVENOUS

## 2023-06-10 MED ORDER — SODIUM CHLORIDE 0.9 % IV SOLN: 250.0000 mL | INTRAVENOUS | Status: DC | PRN

## 2023-06-10 MED ORDER — SODIUM CHLORIDE 0.9% FLUSH: 3.0000 mL | Freq: Two times a day (BID) | INTRAVENOUS | Status: DC

## 2023-06-10 MED ORDER — SODIUM CHLORIDE 0.9 % IV SOLN
1.0000 g | Freq: Once | INTRAVENOUS | Status: AC
  Administered 2023-06-10: 1 g via INTRAVENOUS
  Filled 2023-06-10: qty 10

## 2023-06-10 NOTE — ED Triage Notes (Signed)
Pt BIB EMS from home. Family called 911 around 10am pt disoriented, diaphoretic. EMS initial CBG in the 50s. Pt denies N/V/D. EMS gave 2 doses oral glucose, pt ate breakfast sandwich but sugar continues to drop in low 50s.  Bp 192/104 P 102 Rr 18 O2 98 CBG 71

## 2023-06-10 NOTE — Discharge Instructions (Signed)
You are seen in the emergency department for your low blood sugar.  Your blood sugar remained stable in the emergency department and not drop again.  Your urine still shows that you have an infection and I have given you prescription for a new antibiotic, you can stop taking your previous antibiotic.  You should continue to closely check your blood sugars at home and make sure that you are eating all your meals normally.  Your blood sugar likely dropped related to your infection.  You should follow-up with your primary doctor in the next few days to have your symptoms and blood sugar rechecked.  You should return to the emergency department if you are having recurrent low blood sugars, you are having fevers despite the antibiotics or any other new or concerning symptoms

## 2023-06-10 NOTE — ED Provider Notes (Signed)
[place] EMERGENCY DEPARTMENT AT [facility] Provider Note   CSN: [id number] Arrival date & time: 06/10/23  1135     History  Chief Complaint  Patient presents with   Hypoglycemia    [name] is a 67 y.o. female.  Patient is a 67 year old female with a past medical history of diabetes, recent CVA with left leg weakness, hypertension and recent treatment of UTI presenting to the emergency department with hypoglycemia.  The patient states that she took her Evaristo Bury last night and when she woke up this morning she felt confused.  She could understand people were talking to her but felt like she could not respond.  Emily called 911 and per EMS she was pale and diaphoretic on their arrival, blood sugar in the 50s.  They gave her oral glucose and have her eat a breakfast sandwich but her blood sugar remained low and they brought her into the emergency department.  On her arrival here the patient reports improvement of her symptoms.  She reports that she is still had urinary frequency and urgency since starting her antibiotics for her UTI.  Denies any fevers or recent nausea, vomiting or diarrhea.  Denies any recent changes to her insulin dose and states that she was eating and drinking normally yesterday.  The history is provided by the patient.  Hypoglycemia      Home Medications Prior to Admission medications   Medication Sig Start Date End Date Taking? Authorizing Provider  aspirin EC 81 MG tablet Take 81 mg by mouth daily.   Yes [provider]  atorvastatin (LIPITOR) 40 MG tablet Take 1 tablet (40 mg total) by mouth daily at 6 PM. 06/07/23 07/07/23 Yes Scoggins, Amber, NP  cephALEXin (KEFLEX) 500 MG capsule Take 1 capsule (500 mg total) by mouth 4 (four) times daily. 06/10/23  Yes Elayne Snare K, DO  clopidogrel (PLAVIX) 75 MG tablet Take 75 mg by mouth daily. 04/09/19  Yes [provider]  insulin degludec (TRESIBA FLEXTOUCH) 200 UNIT/ML  FlexTouch Pen Inject 20 Units into the skin at bedtime. 05/08/23  Yes Scoggins, Amber, NP  losartan (COZAAR) 100 MG tablet Take 100 mg by mouth daily.   Yes [provider]  meclizine (ANTIVERT) 12.5 MG tablet Take 1 tablet (12.5 mg total) by mouth 3 (three) times daily as needed for dizziness. 05/08/23  Yes Scoggins, Hospital doctor, NP      Allergies    Other, Penicillins, Toradol [ketorolac tromethamine], Imdur [isosorbide nitrate], Latex, and Metformin and related    Review of Systems   Review of Systems  Physical Exam Updated Vital Signs BP (!) 143/66   Pulse 80   Temp 98 F (36.7 C) (Oral)   Resp 14   SpO2 99%  Physical Exam Vitals and nursing note reviewed.  Constitutional:      General: She is not in acute distress.    Appearance: Normal appearance.  HENT:     Head: Normocephalic and atraumatic.     Nose: Nose normal.     Mouth/Throat:     Mouth: Mucous membranes are moist.     Pharynx: Oropharynx is clear.  Eyes:     Extraocular Movements: Extraocular movements intact.     Conjunctiva/sclera: Conjunctivae normal.     Pupils: Pupils are equal, round, and reactive to light.  Cardiovascular:     Rate and Rhythm: Normal rate and regular rhythm.     Heart sounds: Normal heart sounds.  Pulmonary:     Effort:  Pulmonary effort is normal.     Breath sounds: Normal breath sounds.  Abdominal:     General: Abdomen is flat.     Palpations: Abdomen is soft.     Tenderness: There is no abdominal tenderness.  Musculoskeletal:        General: Normal range of motion.     Cervical back: Normal range of motion and neck supple.  Skin:    General: Skin is warm and dry.  Neurological:     General: No focal deficit present.     Mental Status: She is alert and oriented to person, place, and time.     Cranial Nerves: No cranial nerve deficit.     Sensory: No sensory deficit.     Motor: No weakness.     Coordination: Coordination normal.     Comments: No drift in all 4  extremities  Psychiatric:        Mood and Affect: Mood normal.        Behavior: Behavior normal.     ED Results / Procedures / Treatments   Labs (all labs ordered are listed, but only abnormal results are displayed) Labs Reviewed  COMPREHENSIVE METABOLIC PANEL - Abnormal; Notable for the following components:      Result Value   Chloride 112 (*)    Glucose, Bld 126 (*)    BUN 25 (*)    Calcium 10.5 (*)    All other components within normal limits  CBC - Abnormal; Notable for the following components:   Hemoglobin 11.2 (*)    HCT 35.6 (*)    All other components within normal limits  URINALYSIS, ROUTINE W REFLEX MICROSCOPIC - Abnormal; Notable for the following components:   APPearance HAZY (*)    Leukocytes,Ua MODERATE (*)    Bacteria, UA MANY (*)    All other components within normal limits  CBG MONITORING, ED - Abnormal; Notable for the following components:   Glucose-Capillary 111 (*)    All other components within normal limits  I-STAT CHEM 8, ED - Abnormal; Notable for the following components:   BUN 29 (*)    Glucose, Bld 121 (*)    Hemoglobin 11.2 (*)    HCT 33.0 (*)    All other components within normal limits  CBG MONITORING, ED - Abnormal; Notable for the following components:   Glucose-Capillary 128 (*)    All other components within normal limits  CBG MONITORING, ED - Abnormal; Notable for the following components:   Glucose-Capillary 109 (*)    All other components within normal limits  CBG MONITORING, ED    EKG EKG Interpretation Date/Time:  Monday June 10 2023 12:08:14 EST Ventricular Rate:  100 PR Interval:  159 QRS Duration:  111 QT Interval:  381 QTC Calculation: 492 R Axis:   1  Text Interpretation: Sinus tachycardia Low voltage, extremity leads Borderline prolonged QT interval No significant change since last tracing Confirmed by Elayne Snare ([id number]) on 06/10/2023 12:09:11 PM  Radiology No results found.  Procedures Procedures     Medications Ordered in ED Medications  sodium chloride flush (NS) 0.9 % injection 3 mL (3 mLs Intravenous Not Given 06/10/23 1408)  sodium chloride flush (NS) 0.9 % injection 3 mL (3 mLs Intravenous Given 06/10/23 1427)  0.9 %  sodium chloride infusion (has no administration in time range)  cefTRIAXone (ROCEPHIN) 1 g in sodium chloride 0.9 % 100 mL IVPB (0 g Intravenous Stopped 06/10/23 1427)    ED Course/ Medical Decision Making/  A&P Clinical Course as of 06/10/23 1456  Mon Jun 10, 2023  1258 Many bacteria and moderate leuks. Will be given a dose of ceftriaxone and likely will need antibiotics changed for UTI. [VK]  1301 Repeat glucose remains stable, pending CMP. Will observe for at least 1 more hour for repeat glucose. [VK]  1321 Mild hypercalcemia otherwise CMP within normal range. [VK]  1443 Rpt glucose normal. Patient is stable for discharge home with outpatient follow up. [VK]    Clinical Course User Index [VK] Rexford Maus, DO                                 Medical Decision Making This patient presents to the ED with chief complaint(s) of hypogylcemia with pertinent past medical history of DM, HTN, recent CVA which further complicates the presenting complaint. The complaint involves an extensive differential diagnosis and also carries with it a high risk of complications and morbidity.    The differential diagnosis includes hypoglycemia, dehydration, electrolyte abnormality, infection, no focal neurologic deficits making CVA less likely, arrhythmia  Additional history obtained: Additional history obtained from EMS  Records reviewed Primary Care Documents  ED Course and Reassessment: On patient's arrival she is hemodynamically stable in no acute distress, back to her neurologic baseline.  Accu-Chek on arrival was 111.  Patient will continue to be closely monitored with serial glucose and will be given more food and juice to eat and drink.  She will have labs and  urine to evaluate for cause of her hypoglycemia and will be closely reassessed.  Independent labs interpretation:  The following labs were independently interpreted: Urine positive for UTI, labs otherwise within normal range  Independent visualization of imaging: - N/A  Consultation: - Consulted or discussed management/test interpretation w/ external professional: N/A  Consideration for admission or further workup: Patient has no emergent conditions requiring admission or further work-up at this time and is stable for discharge home with primary care follow-up  Social Determinants of health: N/A    Amount and/or Complexity of Data Reviewed Labs: ordered.  Risk Prescription drug management.          Final Clinical Impression(s) / ED Diagnoses Final diagnoses:  Hypoglycemia  Acute cystitis without hematuria    Rx / DC Orders ED Discharge Orders          Ordered    cephALEXin (KEFLEX) 500 MG capsule  4 times daily        06/10/23 1454              Rexford Maus, DO 06/10/23 1456

## 2023-06-11 LAB — URINE CULTURE

## 2023-06-24 ENCOUNTER — Telehealth: Payer: Self-pay | Admitting: Cardiology

## 2023-06-24 NOTE — Telephone Encounter (Signed)
[facility] need verbal orders that pt can continue Pt under her new insurance, which would be [facility] ( but with new policy number)

## 2023-06-24 NOTE — Telephone Encounter (Signed)
Called Tiffany back and gave verbal orders

## 2023-07-04 ENCOUNTER — Telehealth: Payer: Self-pay | Admitting: Cardiology

## 2023-07-04 NOTE — Telephone Encounter (Signed)
 Gerri Spore, PT with [facility], left VM requesting verbal orders for PT 1w9. Did not leave callback number.

## 2023-07-19 ENCOUNTER — Encounter: Payer: Self-pay | Admitting: Cardiology

## 2023-07-19 ENCOUNTER — Ambulatory Visit: Payer: Medicare (Managed Care) | Admitting: Cardiology

## 2023-07-19 VITALS — BP 118/72 | HR 90 | Ht 66.0 in | Wt 146.6 lb

## 2023-07-19 DIAGNOSIS — N3000 Acute cystitis without hematuria: Secondary | ICD-10-CM

## 2023-07-19 DIAGNOSIS — Z0130 Encounter for examination of blood pressure without abnormal findings: Secondary | ICD-10-CM

## 2023-07-19 DIAGNOSIS — N390 Urinary tract infection, site not specified: Secondary | ICD-10-CM | POA: Diagnosis not present

## 2023-07-19 LAB — POCT URINALYSIS DIPSTICK
Bilirubin, UA: NEGATIVE
Blood, UA: POSITIVE
Glucose, UA: NEGATIVE
Ketones, UA: NEGATIVE
Nitrite, UA: POSITIVE
Protein, UA: NEGATIVE
Spec Grav, UA: 1.02 (ref 1.010–1.025)
Urobilinogen, UA: 0.2 U/dL
pH, UA: 6 (ref 5.0–8.0)

## 2023-07-19 MED ORDER — NITROFURANTOIN MONOHYD MACRO 100 MG PO CAPS: 100.0000 mg | ORAL_CAPSULE | Freq: Two times a day (BID) | ORAL | 0 refills | Status: AC

## 2023-07-19 MED ORDER — ATORVASTATIN CALCIUM 40 MG PO TABS: 40.0000 mg | ORAL_TABLET | Freq: Every day | ORAL | 1 refills | Status: AC

## 2023-07-19 MED ORDER — CLOPIDOGREL BISULFATE 75 MG PO TABS: 75.0000 mg | ORAL_TABLET | Freq: Every day | ORAL | 1 refills | Status: DC

## 2023-07-19 MED ORDER — MECLIZINE HCL 12.5 MG PO TABS: 12.5000 mg | ORAL_TABLET | Freq: Three times a day (TID) | ORAL | 3 refills | Status: AC | PRN

## 2023-07-19 NOTE — Progress Notes (Signed)
Established Patient Office Visit  Subjective:  Patient ID: [name], female    DOB: 1955-06-29  Age: 68 y.o. MRN: [id number]  Chief Complaint  Patient presents with   Follow-up    Patient in office needing refills, thinks she may have a UTI. Medications refilled. Will get a UA. Patient with frequent UTIs, will send referral to urology.     No other concerns at this time.   Past Medical History:  Diagnosis Date   Anxiety    a. with claustrophobia   Arthritis    "right hand; maybe just starting in my left hand" (04/21/2012)   Asthma    CAD (coronary artery disease)    a. PCI [place] 2004. b. Angina - balloon angioplasty to LAD 04/21/12 (vessel too small for stent).   Carotid stenosis    a. 11/2011 critical R, ~30% L by angiogram;  b. 04/2012 s/p R CEA.   Cataract    left   Chronic lower back pain    Depression    a. occasionally takes st john's wart   Diabetes mellitus type 2, uncontrolled    Dysrhythmia    Embolic stroke (HCC)    a. 11/2011 MRI: mult small b/l infarcts c/w emboli;  b. 11/2011 MRA neck: high grade RICA stenosis;  c. 6/20013 TEE: EF 55-60% with mild LVH, severe MAC, < 1 cm calc mobile structure on the MV annulus, ? 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 Thought was RICA stenosis did not explain the b/l small strokes; concern for emboli from mobile MV struct;  d. 01/2012 Event Mon:  no AFib   History of echocardiogram    Echo 12/16: mild LVH, EF 55-60%, no RWMA, Gr 2 DD, trivial AI, MAC, mild MR, mild LAE   History of kidney stones    History of nuclear stress test    a. Myoview 12/16: EF 46%, no ischemia, Intermediate Risk (low EF)   HLD (hyperlipidemia)    a. 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 visual changes   Myocardial infarction [facility])    prior to 2013   Peripheral neuropathy    Refusal of blood transfusions as patient is Jehovah's Witness    Stroke (HCC) 2015- dec   mini  stroke   Uncontrolled type 2 diabetes mellitus with circulatory disorder, with long-term current use of insulin 05/10/2015    Past Surgical History:  Procedure Laterality Date   ANKLE SURGERY  2005   after fall down stairs, left with screws and plate in place   CARDIAC CATHETERIZATION  2004   WNL, no significant coronary disease   CARDIAC CATHETERIZATION  12/19/11   CAROTID ANGIOGRAM Bilateral 12/19/2011   Procedure: CAROTID ANGIOGRAM;  Surgeon: Nada Libman, MD;  Location: [facility] CATH LAB;  Service: Cardiovascular;  Laterality: Bilateral;   CAROTID ENDARTERECTOMY     CARPAL TUNNEL RELEASE  ~ 2005   bilateral   CATARACT EXTRACTION W/ INTRAOCULAR LENS IMPLANT  2012   right   CATARACT EXTRACTION W/PHACO Left 10/2015   Dr Noel Gerold at [facility]   CORONARY ANGIOPLASTY  04/21/2012   95% stenosis mid LAD, vessel not large enough for stent, rec plavix/ASA for 1 mo   CORONARY ANGIOPLASTY WITH STENT PLACEMENT  2002   x2   ENDARTERECTOMY  05/09/2012   Procedure: ENDARTERECTOMY CAROTID;  Surgeon: Nada Libman, MD;  Location: [facility] OR;  Service: Vascular;  Laterality: Right;  Right  carotd endarterectomy with bovine patch angioplasty   ENDARTERECTOMY Left 06/23/2015   Procedure: ENDARTERECTOMY CAROTID;  Surgeon: Nada Libman, MD;  Location: [facility] OR;  Service: Vascular;  Laterality: Left;   EYE SURGERY Left 04/2015   laser surgery,blood vessels rupturing   holter monitor  2013   WNL   INTRAMEDULLARY (IM) NAIL INTERTROCHANTERIC Right 02/15/2023   Procedure: INTRAMEDULLARY NAILING RIGHT HIP;  Surgeon: Roby Lofts, MD;  Location: [facility] OR;  Service: Orthopedics;  Laterality: Right;   KIDNEY STONE SURGERY     several times   LITHOTRIPSY     LOOP RECORDER INSERTION N/A 07/22/2019   Procedure: LOOP RECORDER INSERTION;  Surgeon: Marcina Millard, MD;  Location: [facility] INVASIVE CV LAB;  Service: Cardiovascular;  Laterality: N/A;   PARS PLANA VITRECTOMY Left 04/2015   [facility])   [facility] ANGIOPLASTY Left  06/23/2015   Procedure: PATCH ANGIOPLASTY USING 1cm x 4cm BOVINE PERICARDIAL PATCH;  Surgeon: Nada Libman, MD;  Location: [facility] OR;  Service: Vascular;  Laterality: Left;   PERCUTANEOUS CORONARY STENT INTERVENTION (PCI-S) N/A 04/21/2012   Procedure: PERCUTANEOUS CORONARY STENT INTERVENTION (PCI-S);  Surgeon: Kathleene Hazel, MD;  Location: [facility] CATH LAB;  Service: Cardiovascular;  Laterality: N/A;   TEE WITHOUT CARDIOVERSION  12/14/2011   Procedure: TRANSESOPHAGEAL ECHOCARDIOGRAM (TEE);  Surgeon: Laurey Morale, MD;  Location: [facility] ENDOSCOPY;  Service: Cardiovascular;  Laterality: N/A;   US ECHOCARDIOGRAPHY  2002   normal LV fxn, mild LVH, borderline L atrial size    Social History   Socioeconomic History   Marital status: Married    Spouse name: Not on file   Number of children: Not on file   Years of education: Not on file   Highest education level: Not on file  Occupational History   Not on file  Tobacco Use   Smoking status: Former    Current packs/day: 0.00    Types: Cigarettes    Quit date: 06/25/1972    Years since quitting: 51.0   Smokeless tobacco: Never  Substance and Sexual Activity   Alcohol use: No    Comment: none since 11/2011   Drug use: No   Sexual activity: Yes    Birth control/protection: Post-menopausal  Other Topics Concern   Not on file  Social History Narrative   Caffeine: 2 cups/day coffee   Lives alone, husband in [place].  4 children nearby, son to live with her.   Occupation: unemployed, prior worked as IT consultant   Activity: stays active on farm   Diet: vegan, no processed foods, good water daily, fruits/vegetables daily   jehovah's witness - no blood products.   Social Drivers of Corporate investment banker Strain: Not on file  Food Insecurity: No Food Insecurity (05/11/2023)   Hunger Vital Sign    Worried About Running Out of Food in the Last Year: Never true    Ran Out of Food in the Last Year: Never true  Transportation Needs: No  Transportation Needs (05/11/2023)   PRAPARE - Administrator, Civil Service (Medical): No    Lack of Transportation (Non-Medical): No  Physical Activity: Not on file  Stress: Not on file  Social Connections: Not on file  Intimate Partner Violence: Not At Risk (05/11/2023)   Humiliation, Afraid, Rape, and Kick questionnaire    Fear of Current or Ex-Partner: No    Emotionally Abused: No    Physically Abused: No    Sexually Abused: No    Family History  Problem Relation Age of Onset   Heart disease Mother        valve problems   Stroke Mother    Hypertension Mother    Heart disease Sister        valve problems   Heart disease Brother    Diabetes Father    Hypertension Father    AAA (abdominal aortic aneurysm) Father    Cancer Neg Hx     Allergies  Allergen Reactions   Other Shortness Of Breath    No blood or blood products   Penicillins Itching and Rash    Rash occurred ass a teenager -- took for strep throat.   Toradol [Ketorolac Tromethamine] Other (See Comments)    Panic attack    Imdur [Isosorbide Nitrate] Other (See Comments)    Headaches    Latex Rash    Rash on all points of contact (thick, heavy itching rash)   Metformin And Related Diarrhea and Nausea And Vomiting    Outpatient Medications Prior to Visit  Medication Sig   insulin degludec (TRESIBA FLEXTOUCH) 200 UNIT/ML FlexTouch Pen Inject 20 Units into the skin at bedtime.   losartan (COZAAR) 100 MG tablet Take 100 mg by mouth daily.   [DISCONTINUED] clopidogrel (PLAVIX) 75 MG tablet Take 75 mg by mouth daily.   [DISCONTINUED] meclizine (ANTIVERT) 12.5 MG tablet Take 1 tablet (12.5 mg total) by mouth 3 (three) times daily as needed for dizziness.   aspirin EC 81 MG tablet Take 81 mg by mouth daily.   [DISCONTINUED] atorvastatin (LIPITOR) 40 MG tablet Take 1 tablet (40 mg total) by mouth daily at 6 PM.   [DISCONTINUED] cephALEXin (KEFLEX) 500 MG capsule Take 1 capsule (500 mg total) by mouth 4  (four) times daily. (Patient not taking: Reported on 07/19/2023)   No facility-administered medications prior to visit.    Review of Systems  Constitutional: Negative.   HENT: Negative.    Eyes: Negative.   Respiratory: Negative.  Negative for shortness of breath.   Cardiovascular: Negative.  Negative for chest pain.  Gastrointestinal: Negative.  Negative for abdominal pain, constipation and diarrhea.  Genitourinary:  Positive for frequency and urgency.  Musculoskeletal:  Negative for joint pain and myalgias.  Skin: Negative.   Neurological: Negative.  Negative for dizziness and headaches.  Endo/Heme/Allergies: Negative.   All other systems reviewed and are negative.      Objective:   BP 118/72   Pulse 90   Ht 5\' 6"  (1.676 m)   Wt 146 lb 9.6 oz (66.5 kg)   SpO2 99%   BMI 23.66 kg/m   Vitals:   07/19/23 0934  BP: 118/72  Pulse: 90  Height: 5\' 6"  (1.676 m)  Weight: 146 lb 9.6 oz (66.5 kg)  SpO2: 99%  BMI (Calculated): 23.67    Physical Exam Vitals and nursing note reviewed.  Constitutional:      Appearance: Normal appearance. She is normal weight.  HENT:     Head: Normocephalic and atraumatic.     Nose: Nose normal.     Mouth/Throat:     Mouth: Mucous membranes are moist.  Eyes:     Extraocular Movements: Extraocular movements intact.     Conjunctiva/sclera: Conjunctivae normal.     Pupils: Pupils are equal, round, and reactive to light.  Cardiovascular:     Rate and Rhythm: Normal rate and regular rhythm.     Pulses: Normal pulses.     Heart sounds: Normal heart sounds.  Pulmonary:  Effort: Pulmonary effort is normal.     Breath sounds: Normal breath sounds.  Abdominal:     General: Abdomen is flat. Bowel sounds are normal.     Palpations: Abdomen is soft.  Musculoskeletal:        General: Normal range of motion.     Cervical back: Normal range of motion.  Skin:    General: Skin is warm and dry.  Neurological:     General: No focal deficit  present.     Mental Status: She is alert and oriented to person, place, and time.  Psychiatric:        Mood and Affect: Mood normal.        Behavior: Behavior normal.        Thought Content: Thought content normal.        Judgment: Judgment normal.      Results for orders placed or performed in visit on 07/19/23  POCT Urinalysis Dipstick (81002)  Result Value Ref Range   Color, UA     Clarity, UA     Glucose, UA Negative Negative   Bilirubin, UA Negative    Ketones, UA Negative    Spec Grav, UA 1.020 1.010 - 1.025   Blood, UA Positive    pH, UA 6.0 5.0 - 8.0   Protein, UA Negative Negative   Urobilinogen, UA 0.2 0.2 or 1.0 E.U./dL   Nitrite, UA Positive    Leukocytes, UA Small (1+) (A) Negative   Appearance Cloudy    Odor Foul     Recent Results (from the past 2160 hours)  CMP14+EGFR     Status: Abnormal   Collection Time: 05/08/23 10:51 AM  Result Value Ref Range   Glucose 62 (L) 70 - 99 mg/dL   BUN 23 8 - 27 mg/dL   Creatinine, Ser 9.56 0.57 - 1.00 mg/dL   eGFR 63 >21 HY/QMV/7.84   BUN/Creatinine Ratio 23 12 - 28   Sodium 141 134 - 144 mmol/L   Potassium 4.7 3.5 - 5.2 mmol/L   Chloride 103 96 - 106 mmol/L   CO2 23 20 - 29 mmol/L   Calcium 11.3 (H) 8.7 - 10.3 mg/dL   Total Protein 7.0 6.0 - 8.5 g/dL   Albumin 4.3 3.9 - 4.9 g/dL   Globulin, Total 2.7 1.5 - 4.5 g/dL   Bilirubin Total 0.3 0.0 - 1.2 mg/dL   Alkaline Phosphatase 115 44 - 121 IU/L   AST 14 0 - 40 IU/L   ALT 8 0 - 32 IU/L  Lipid Profile     Status: Abnormal   Collection Time: 05/08/23 10:51 AM  Result Value Ref Range   Cholesterol, Total 194 100 - 199 mg/dL   Triglycerides 84 0 - 149 mg/dL   HDL 60 >69 mg/dL   VLDL Cholesterol Cal 15 5 - 40 mg/dL   LDL Chol Calc (NIH) 629 (H) 0 - 99 mg/dL   Chol/HDL Ratio 3.2 0.0 - 4.4 ratio    Comment:                                   T. Chol/HDL Ratio                                             Men  Women  1/2 Avg.Risk  3.4     3.3                                   Avg.Risk  5.0    4.4                                2X Avg.Risk  9.6    7.1                                3X Avg.Risk 23.4   11.0   Hemoglobin A1c     Status: Abnormal   Collection Time: 05/08/23 10:51 AM  Result Value Ref Range   Hgb A1c MFr Bld 6.8 (H) 4.8 - 5.6 %    Comment:          Prediabetes: 5.7 - 6.4          Diabetes: >6.4          Glycemic control for adults with diabetes: <7.0    Est. average glucose Bld gHb Est-mCnc 148 mg/dL  TSH     Status: None   Collection Time: 05/08/23 10:51 AM  Result Value Ref Range   TSH 1.190 0.450 - 4.500 uIU/mL  POCT Urinalysis Dipstick (40981)     Status: Abnormal   Collection Time: 05/08/23 10:52 AM  Result Value Ref Range   Color, UA yellow    Clarity, UA cloudy    Glucose, UA Negative Negative   Bilirubin, UA neg    Ketones, UA neg    Spec Grav, UA 1.025 1.010 - 1.025   Blood, UA pos    pH, UA 6.0 5.0 - 8.0   Protein, UA Negative Negative   Urobilinogen, UA 0.2 0.2 or 1.0 E.U./dL   Nitrite, UA pos    Leukocytes, UA Moderate (2+) (A) Negative   Appearance cloudy    Odor yes   Protime-INR     Status: None   Collection Time: 05/11/23 10:11 AM  Result Value Ref Range   Prothrombin Time 14.3 11.4 - 15.2 seconds   INR 1.1 0.8 - 1.2    Comment: (NOTE) INR goal varies based on device and disease states. Performed at [facility], [address] [place], [place] [address]   APTT     Status: None   Collection Time: 05/11/23 10:11 AM  Result Value Ref Range   aPTT 32 24 - 36 seconds    Comment: Performed at [facility], [address] [place], [place] [address]  CBC     Status: Abnormal   Collection Time: 05/11/23 10:11 AM  Result Value Ref Range   WBC 4.2 4.0 - 10.5 K/uL   RBC 4.15 3.87 - 5.11 MIL/uL   Hemoglobin 11.8 (L) 12.0 - 15.0 g/dL   HCT 21.3 08.6 - 57.8 %   MCV 87.2 80.0 - 100.0 fL   MCH 28.4 26.0 - 34.0 pg   MCHC 32.6 30.0 - 36.0 g/dL   RDW 46.9 62.9 -  52.8 %   Platelets 310 150 - 400 K/uL   nRBC 0.0 0.0 - 0.2 %    Comment: Performed at [facility], [address], [place], [place] [address]  Differential     Status: None   Collection Time: 05/11/23 10:11 AM  Result  Value Ref Range   Neutrophils Relative % 46 %   Neutro Abs 1.9 1.7 - 7.7 K/uL   Lymphocytes Relative 41 %   Lymphs Abs 1.7 0.7 - 4.0 K/uL   Monocytes Relative 8 %   Monocytes Absolute 0.3 0.1 - 1.0 K/uL   Eosinophils Relative 4 %   Eosinophils Absolute 0.2 0.0 - 0.5 K/uL   Basophils Relative 1 %   Basophils Absolute 0.0 0.0 - 0.1 K/uL   Immature Granulocytes 0 %   Abs Immature Granulocytes 0.01 0.00 - 0.07 K/uL    Comment: Performed at [facility], [address] [place], [place] [address]  Comprehensive metabolic panel     Status: None   Collection Time: 05/11/23 10:11 AM  Result Value Ref Range   Sodium 139 135 - 145 mmol/L   Potassium 3.7 3.5 - 5.1 mmol/L   Chloride 107 98 - 111 mmol/L   CO2 25 22 - 32 mmol/L   Glucose, Bld 85 70 - 99 mg/dL    Comment: Glucose reference range applies only to samples taken after fasting for at least 8 hours.   BUN 20 8 - 23 mg/dL   Creatinine, Ser 6.96 0.44 - 1.00 mg/dL   Calcium 29.5 8.9 - 28.4 mg/dL   Total Protein 7.5 6.5 - 8.1 g/dL   Albumin 4.2 3.5 - 5.0 g/dL   AST 17 15 - 41 U/L   ALT 11 0 - 44 U/L   Alkaline Phosphatase 93 38 - 126 U/L   Total Bilirubin 0.5 <1.2 mg/dL   GFR, Estimated >13 >24 mL/min    Comment: (NOTE) Calculated using the CKD-EPI Creatinine Equation (2021)    Anion gap 7 5 - 15    Comment: Performed at [facility], [address] [place], [place] [address]  Glucose, capillary     Status: None   Collection Time: 05/11/23  9:51 PM  Result Value Ref Range   Glucose-Capillary 76 70 - 99 mg/dL    Comment: Glucose reference range applies only to samples taken after fasting for at least 8 hours.  Lipid panel     Status: Abnormal   Collection Time: 05/12/23  4:29 AM   Result Value Ref Range   Cholesterol 176 0 - 200 mg/dL   Triglycerides 67 <725 mg/dL   HDL 48 >36 mg/dL   Total CHOL/HDL Ratio 3.7 RATIO   VLDL 13 0 - 40 mg/dL   LDL Cholesterol 644 (H) 0 - 99 mg/dL    Comment:        Total Cholesterol/HDL:CHD Risk Coronary Heart Disease Risk Table                     Men   Women  1/2 Average Risk   3.4   3.3  Average Risk       5.0   4.4  2 X Average Risk   9.6   7.1  3 X Average Risk  23.4   11.0        Use the calculated Patient Ratio above and the CHD Risk Table to determine the patient's CHD Risk.        ATP III CLASSIFICATION (LDL):  <100     mg/dL   Optimal  034-742  mg/dL   Near or Above                    Optimal  130-159  mg/dL   Borderline  595-638  mg/dL  High  >190     mg/dL   Very High Performed at [facility], [address] [place], [place] [address]   Basic metabolic panel     Status: None   Collection Time: 05/12/23  4:29 AM  Result Value Ref Range   Sodium 142 135 - 145 mmol/L   Potassium 3.6 3.5 - 5.1 mmol/L   Chloride 108 98 - 111 mmol/L   CO2 24 22 - 32 mmol/L   Glucose, Bld 72 70 - 99 mg/dL    Comment: Glucose reference range applies only to samples taken after fasting for at least 8 hours.   BUN 23 8 - 23 mg/dL   Creatinine, Ser 1.91 0.44 - 1.00 mg/dL   Calcium 47.8 8.9 - 29.5 mg/dL   GFR, Estimated >62 >13 mL/min    Comment: (NOTE) Calculated using the CKD-EPI Creatinine Equation (2021)    Anion gap 10 5 - 15    Comment: Performed at [facility], [address] [place], [place] [address]  Glucose, capillary     Status: Abnormal   Collection Time: 05/12/23  4:59 AM  Result Value Ref Range   Glucose-Capillary 66 (L) 70 - 99 mg/dL    Comment: Glucose reference range applies only to samples taken after fasting for at least 8 hours.  Glucose, capillary     Status: Abnormal   Collection Time: 05/12/23  5:30 AM  Result Value Ref Range   Glucose-Capillary 68 (L) 70 - 99 mg/dL     Comment: Glucose reference range applies only to samples taken after fasting for at least 8 hours.  Glucose, capillary     Status: None   Collection Time: 05/12/23  5:54 AM  Result Value Ref Range   Glucose-Capillary 77 70 - 99 mg/dL    Comment: Glucose reference range applies only to samples taken after fasting for at least 8 hours.  Glucose, capillary     Status: Abnormal   Collection Time: 05/12/23  9:14 AM  Result Value Ref Range   Glucose-Capillary 124 (H) 70 - 99 mg/dL    Comment: Glucose reference range applies only to samples taken after fasting for at least 8 hours.  Urinalysis, w/ Reflex to Culture (Infection Suspected) -Urine, Clean Catch     Status: Abnormal   Collection Time: 05/12/23 10:45 AM  Result Value Ref Range   Specimen Source URINE, CLEAN CATCH    Color, Urine YELLOW (A) YELLOW   APPearance CLOUDY (A) CLEAR   Specific Gravity, Urine 1.034 (H) 1.005 - 1.030   pH 6.0 5.0 - 8.0   Glucose, UA NEGATIVE NEGATIVE mg/dL   Hgb urine dipstick NEGATIVE NEGATIVE   Bilirubin Urine NEGATIVE NEGATIVE   Ketones, ur NEGATIVE NEGATIVE mg/dL   Protein, ur 30 (A) NEGATIVE mg/dL   Nitrite NEGATIVE NEGATIVE   Leukocytes,Ua LARGE (A) NEGATIVE   RBC / HPF 0-5 0 - 5 RBC/hpf   WBC, UA >50 0 - 5 WBC/hpf    Comment:        Reflex urine culture not performed if WBC <=10, OR if Squamous epithelial cells >5. If Squamous epithelial cells >5 suggest recollection.    Bacteria, UA RARE (A) NONE SEEN   Squamous Epithelial / HPF 0-5 0 - 5 /HPF   Mucus PRESENT    Hyaline Casts, UA PRESENT     Comment: Performed at [facility], [address] [place], [place] [address]  Urine Culture     Status: Abnormal  Collection Time: 05/12/23 10:45 AM   Specimen: Urine, Random  Result Value Ref Range   Specimen Description      URINE, RANDOM Performed at [facility], [address] [place], [place] [address]    Special Requests      NONE Reflexed from [id number] Performed at  [facility], [address] [place], [place] [address]    Culture 60,000 COLONIES/mL ESCHERICHIA COLI (A)    Report Status 05/14/2023 FINAL    Organism ID, Bacteria ESCHERICHIA COLI (A)       Susceptibility   Escherichia coli - MIC*    AMPICILLIN 4 SENSITIVE Sensitive     CEFAZOLIN <=4 SENSITIVE Sensitive     CEFEPIME <=0.12 SENSITIVE Sensitive     CEFTRIAXONE <=0.25 SENSITIVE Sensitive     CIPROFLOXACIN >=4 RESISTANT Resistant     GENTAMICIN <=1 SENSITIVE Sensitive     IMIPENEM <=0.25 SENSITIVE Sensitive     NITROFURANTOIN <=16 SENSITIVE Sensitive     TRIMETH/SULFA >=320 RESISTANT Resistant     AMPICILLIN/SULBACTAM <=2 SENSITIVE Sensitive     PIP/TAZO <=4 SENSITIVE Sensitive ug/mL    * 60,000 COLONIES/mL ESCHERICHIA COLI  ECHOCARDIOGRAM COMPLETE     Status: None   Collection Time: 05/12/23 11:54 AM  Result Value Ref Range   Weight 2,289.6 oz   Height 66.5 in   BP 100/55 mmHg   Ao pk vel 1.17 m/s   AR max vel 1.87 cm2   AV Peak grad 5.5 mmHg   S' Lateral 3.10 cm   Area-P 1/2 2.72 cm2   MV VTI 0.95 cm2   Est EF 65 - 70%   Glucose, capillary     Status: Abnormal   Collection Time: 05/12/23 12:21 PM  Result Value Ref Range   Glucose-Capillary 138 (H) 70 - 99 mg/dL    Comment: Glucose reference range applies only to samples taken after fasting for at least 8 hours.  Glucose, capillary     Status: Abnormal   Collection Time: 05/12/23  4:50 PM  Result Value Ref Range   Glucose-Capillary 185 (H) 70 - 99 mg/dL    Comment: Glucose reference range applies only to samples taken after fasting for at least 8 hours.  Glucose, capillary     Status: Abnormal   Collection Time: 05/12/23  9:17 PM  Result Value Ref Range   Glucose-Capillary 170 (H) 70 - 99 mg/dL    Comment: Glucose reference range applies only to samples taken after fasting for at least 8 hours.  Basic metabolic panel     Status: Abnormal   Collection Time: 05/13/23  4:59 AM  Result Value Ref Range   Sodium  143 135 - 145 mmol/L   Potassium 3.9 3.5 - 5.1 mmol/L   Chloride 110 98 - 111 mmol/L   CO2 24 22 - 32 mmol/L   Glucose, Bld 132 (H) 70 - 99 mg/dL    Comment: Glucose reference range applies only to samples taken after fasting for at least 8 hours.   BUN 28 (H) 8 - 23 mg/dL   Creatinine, Ser 5.36 0.44 - 1.00 mg/dL   Calcium 64.4 8.9 - 03.4 mg/dL   GFR, Estimated >74 >25 mL/min    Comment: (NOTE) Calculated using the CKD-EPI Creatinine Equation (2021)    Anion gap 9 5 - 15    Comment: Performed at [facility], [address] [place], [place] [address]  CBC     Status: Abnormal   Collection Time: 05/13/23  4:59 AM  Result Value Ref Range   WBC 4.8 4.0 - 10.5 K/uL   RBC 3.60 (L) 3.87 - 5.11 MIL/uL   Hemoglobin 10.1 (L) 12.0 - 15.0 g/dL   HCT 13.0 (L) 86.5 - 78.4 %   MCV 85.8 80.0 - 100.0 fL   MCH 28.1 26.0 - 34.0 pg   MCHC 32.7 30.0 - 36.0 g/dL   RDW 69.6 29.5 - 28.4 %   Platelets 260 150 - 400 K/uL   nRBC 0.0 0.0 - 0.2 %    Comment: Performed at [facility], [address], [place], [place] [address]  Magnesium     Status: None   Collection Time: 05/13/23  4:59 AM  Result Value Ref Range   Magnesium 2.1 1.7 - 2.4 mg/dL    Comment: Performed at [facility], [address] [place], [place] [address]  Phosphorus     Status: None   Collection Time: 05/13/23  4:59 AM  Result Value Ref Range   Phosphorus 3.6 2.5 - 4.6 mg/dL    Comment: Performed at [facility], [address] [place], [place] [address]  Glucose, capillary     Status: Abnormal   Collection Time: 05/13/23  8:05 AM  Result Value Ref Range   Glucose-Capillary 108 (H) 70 - 99 mg/dL    Comment: Glucose reference range applies only to samples taken after fasting for at least 8 hours.  Glucose, capillary     Status: Abnormal   Collection Time: 05/13/23 11:49 AM  Result Value Ref Range   Glucose-Capillary 189 (H) 70 - 99 mg/dL    Comment: Glucose reference range applies  only to samples taken after fasting for at least 8 hours.  Glucose, capillary     Status: None   Collection Time: 05/13/23  5:10 PM  Result Value Ref Range   Glucose-Capillary 82 70 - 99 mg/dL    Comment: Glucose reference range applies only to samples taken after fasting for at least 8 hours.  Glucose, capillary     Status: Abnormal   Collection Time: 05/13/23  8:17 PM  Result Value Ref Range   Glucose-Capillary 166 (H) 70 - 99 mg/dL    Comment: Glucose reference range applies only to samples taken after fasting for at least 8 hours.  Basic metabolic panel     Status: Abnormal   Collection Time: 05/14/23  4:51 AM  Result Value Ref Range   Sodium 141 135 - 145 mmol/L   Potassium 3.8 3.5 - 5.1 mmol/L   Chloride 110 98 - 111 mmol/L   CO2 25 22 - 32 mmol/L   Glucose, Bld 101 (H) 70 - 99 mg/dL    Comment: Glucose reference range applies only to samples taken after fasting for at least 8 hours.   BUN 26 (H) 8 - 23 mg/dL   Creatinine, Ser 4.40 0.44 - 1.00 mg/dL   Calcium 9.8 8.9 - 34.7 mg/dL   GFR, Estimated >42 >59 mL/min    Comment: (NOTE) Calculated using the CKD-EPI Creatinine Equation (2021)    Anion gap 6 5 - 15    Comment: Performed at [facility], [address] [place], [place] [address]  CBC     Status: Abnormal   Collection Time: 05/14/23  4:51 AM  Result Value Ref Range   WBC 4.5 4.0 - 10.5 K/uL   RBC 3.36 (L) 3.87 - 5.11 MIL/uL   Hemoglobin 9.4 (L) 12.0 - 15.0 g/dL   HCT 56.4 (  L) 36.0 - 46.0 %   MCV 87.8 80.0 - 100.0 fL   MCH 28.0 26.0 - 34.0 pg   MCHC 31.9 30.0 - 36.0 g/dL   RDW 03.4 74.2 - 59.5 %   Platelets 244 150 - 400 K/uL   nRBC 0.0 0.0 - 0.2 %    Comment: Performed at [facility], [address] [place], [place] [address]  Magnesium     Status: None   Collection Time: 05/14/23  4:51 AM  Result Value Ref Range   Magnesium 1.9 1.7 - 2.4 mg/dL    Comment: Performed at [facility], [address] [place], [place] [address]   Iron and TIBC     Status: None   Collection Time: 05/14/23  4:51 AM  Result Value Ref Range   Iron 49 28 - 170 ug/dL   TIBC 951 884 - 166 ug/dL   Saturation Ratios 17 10.4 - 31.8 %   UIBC 237 ug/dL    Comment: Performed at [facility], [address] [place], [place] [address]  Glucose, capillary     Status: Abnormal   Collection Time: 05/14/23  8:00 AM  Result Value Ref Range   Glucose-Capillary 68 (L) 70 - 99 mg/dL    Comment: Glucose reference range applies only to samples taken after fasting for at least 8 hours.  Glucose, capillary     Status: None   Collection Time: 05/14/23  8:29 AM  Result Value Ref Range   Glucose-Capillary 99 70 - 99 mg/dL    Comment: Glucose reference range applies only to samples taken after fasting for at least 8 hours.  Glucose, capillary     Status: Abnormal   Collection Time: 05/14/23 12:17 PM  Result Value Ref Range   Glucose-Capillary 181 (H) 70 - 99 mg/dL    Comment: Glucose reference range applies only to samples taken after fasting for at least 8 hours.  POCT Urinalysis Dipstick (60109)     Status: Abnormal   Collection Time: 06/07/23 10:28 AM  Result Value Ref Range   Color, UA yellow    Clarity, UA cloudy    Glucose, UA Negative Negative   Bilirubin, UA neg    Ketones, UA neg    Spec Grav, UA 1.025 1.010 - 1.025   Blood, UA pos    pH, UA 6.0 5.0 - 8.0   Protein, UA Negative Negative   Urobilinogen, UA 0.2 0.2 or 1.0 E.U./dL   Nitrite, UA pos    Leukocytes, UA Moderate (2+) (A) Negative   Appearance cloudy    Odor yes   Culture, Urine     Status: Abnormal   Collection Time: 06/07/23 11:58 AM   Specimen: Urine   UC  Result Value Ref Range   Urine Culture, Routine Final report (A)    Organism ID, Bacteria Escherichia coli (A)     Comment: Cefazolin <=4 ug/mL Cefazolin with an MIC <=16 predicts susceptibility to the oral agents cefaclor, cefdinir, cefpodoxime, cefprozil, cefuroxime, cephalexin, and loracarbef when  used for therapy of uncomplicated urinary tract infections due to E. coli, Klebsiella pneumoniae, and Proteus mirabilis. Greater than 100,000 colony forming units per mL    Antimicrobial Susceptibility Comment     Comment:       ** S = Susceptible; I = Intermediate; R = Resistant **                    P = Positive; N = Negative  MICS are expressed in micrograms per mL    Antibiotic                 RSLT#1    RSLT#2    RSLT#3    RSLT#4 Amoxicillin/Clavulanic Acid    S Ampicillin                     S Cefepime                       S Ceftriaxone                    S Cefuroxime                     S Ciprofloxacin                  R Ertapenem                      S Gentamicin                     S Imipenem                       S Levofloxacin                   R Meropenem                      S Nitrofurantoin                 S Piperacillin/Tazobactam        S Tetracycline                   R Tobramycin                     S Trimethoprim/Sulfa             S   CBG monitoring, ED     Status: Abnormal   Collection Time: 06/10/23 11:46 AM  Result Value Ref Range   Glucose-Capillary 111 (H) 70 - 99 mg/dL    Comment: Glucose reference range applies only to samples taken after fasting for at least 8 hours.  Comprehensive metabolic panel     Status: Abnormal   Collection Time: 06/10/23 11:54 AM  Result Value Ref Range   Sodium 144 135 - 145 mmol/L   Potassium 4.8 3.5 - 5.1 mmol/L   Chloride 112 (H) 98 - 111 mmol/L   CO2 24 22 - 32 mmol/L   Glucose, Bld 126 (H) 70 - 99 mg/dL    Comment: Glucose reference range applies only to samples taken after fasting for at least 8 hours.   BUN 25 (H) 8 - 23 mg/dL   Creatinine, Ser 6.44 0.44 - 1.00 mg/dL   Calcium 03.4 (H) 8.9 - 10.3 mg/dL   Total Protein 6.7 6.5 - 8.1 g/dL   Albumin 3.6 3.5 - 5.0 g/dL   AST 18 15 - 41 U/L   ALT 10 0 - 44 U/L   Alkaline Phosphatase 85 38 - 126 U/L   Total Bilirubin 0.6 <1.2 mg/dL   GFR, Estimated >74  >25 mL/min    Comment: (NOTE) Calculated using the CKD-EPI Creatinine Equation (2021)    Anion gap 8 5 - 15    Comment: Performed at [facility], [address] [place], [place] [address]  CBC     Status: Abnormal   Collection Time: 06/10/23 11:54 AM  Result Value Ref Range   WBC 6.2 4.0 - 10.5 K/uL   RBC 4.02 3.87 - 5.11 MIL/uL   Hemoglobin 11.2 (L) 12.0 - 15.0 g/dL   HCT 60.4 (L) 54.0 - 98.1 %   MCV 88.6 80.0 - 100.0 fL   MCH 27.9 26.0 - 34.0 pg   MCHC 31.5 30.0 - 36.0 g/dL   RDW 19.1 47.8 - 29.5 %   Platelets 327 150 - 400 K/uL   nRBC 0.0 0.0 - 0.2 %    Comment: Performed at [facility], [address], [place], [place] [address]  Urinalysis, Routine w reflex microscopic -Urine, Clean Catch     Status: Abnormal   Collection Time: 06/10/23 12:09 PM  Result Value Ref Range   Color, Urine YELLOW YELLOW   APPearance HAZY (A) CLEAR   Specific Gravity, Urine 1.018 1.005 - 1.030   pH 5.0 5.0 - 8.0   Glucose, UA NEGATIVE NEGATIVE mg/dL   Hgb urine dipstick NEGATIVE NEGATIVE   Bilirubin Urine NEGATIVE NEGATIVE   Ketones, ur NEGATIVE NEGATIVE mg/dL   Protein, ur NEGATIVE NEGATIVE mg/dL   Nitrite NEGATIVE NEGATIVE   Leukocytes,Ua MODERATE (A) NEGATIVE   RBC / HPF 0-5 0 - 5 RBC/hpf   WBC, UA 11-20 0 - 5 WBC/hpf   Bacteria, UA MANY (A) NONE SEEN   Squamous Epithelial / HPF 0-5 0 - 5 /HPF   Mucus PRESENT    Hyaline Casts, UA PRESENT    Ca Oxalate Crys, UA PRESENT     Comment: Performed at [facility], [address], [place], [place] [address]  I-Stat Chem 8, ED ([facility], [facility], [facility] only)     Status: Abnormal   Collection Time: 06/10/23 12:21 PM  Result Value Ref Range   Sodium 143 135 - 145 mmol/L   Potassium 4.8 3.5 - 5.1 mmol/L   Chloride 111 98 - 111 mmol/L   BUN 29 (H) 8 - 23 mg/dL   Creatinine, Ser 4.69 0.44 - 1.00 mg/dL   Glucose, Bld 629 (H) 70 - 99 mg/dL    Comment: Glucose reference range applies only to samples taken after fasting for at least 8 hours.    Calcium, Ion 1.31 1.15 - 1.40 mmol/L   TCO2 27 22 - 32 mmol/L   Hemoglobin 11.2 (L) 12.0 - 15.0 g/dL   HCT 52.8 (L) 41.3 - 24.4 %  POC CBG, ED     Status: Abnormal   Collection Time: 06/10/23 12:58 PM  Result Value Ref Range   Glucose-Capillary 128 (H) 70 - 99 mg/dL    Comment: Glucose reference range applies only to samples taken after fasting for at least 8 hours.  POC CBG, ED     Status: Abnormal   Collection Time: 06/10/23  2:31 PM  Result Value Ref Range   Glucose-Capillary 109 (H) 70 - 99 mg/dL    Comment: Glucose reference range applies only to samples taken after fasting for at least 8 hours.  POCT Urinalysis Dipstick (01027)     Status: Abnormal   Collection Time: 07/19/23 10:13 AM  Result Value Ref Range   Color, UA     Clarity, UA     Glucose, UA Negative Negative   Bilirubin, UA Negative    Ketones, UA Negative    Spec Grav, UA 1.020 1.010 - 1.025   Blood,  UA Positive    pH, UA 6.0 5.0 - 8.0   Protein, UA Negative Negative   Urobilinogen, UA 0.2 0.2 or 1.0 E.U./dL   Nitrite, UA Positive    Leukocytes, UA Small (1+) (A) Negative   Appearance Cloudy    Odor Foul       Assessment & Plan:  UA abnormal, sent in Macrobid. Will send urine for culture. Frequent UTIs, will refer to urology.   Problem List Items Addressed This Visit       Genitourinary   Frequent UTI - Primary   Relevant Medications   nitrofurantoin, macrocrystal-monohydrate, (MACROBID) 100 MG capsule   Other Relevant Orders   Ambulatory referral to Urology   POCT Urinalysis Dipstick ([address]) (Completed)    Return if symptoms worsen or fail to improve, for keep february appt.   Total time spent: 25 minutes  [employer], NP  07/19/2023   This document may have been prepared by Dragon Voice Recognition software and as such may include unintentional dictation errors.

## 2023-07-23 LAB — URINE CULTURE

## 2023-07-30 DIAGNOSIS — I5030 Unspecified diastolic (congestive) heart failure: Secondary | ICD-10-CM | POA: Diagnosis not present

## 2023-07-30 DIAGNOSIS — Z9181 History of falling: Secondary | ICD-10-CM | POA: Diagnosis not present

## 2023-07-30 DIAGNOSIS — M545 Low back pain, unspecified: Secondary | ICD-10-CM | POA: Diagnosis not present

## 2023-07-30 DIAGNOSIS — M19041 Primary osteoarthritis, right hand: Secondary | ICD-10-CM | POA: Diagnosis not present

## 2023-07-30 DIAGNOSIS — E785 Hyperlipidemia, unspecified: Secondary | ICD-10-CM | POA: Diagnosis not present

## 2023-07-30 DIAGNOSIS — J45909 Unspecified asthma, uncomplicated: Secondary | ICD-10-CM | POA: Diagnosis not present

## 2023-07-30 DIAGNOSIS — E1142 Type 2 diabetes mellitus with diabetic polyneuropathy: Secondary | ICD-10-CM | POA: Diagnosis not present

## 2023-07-30 DIAGNOSIS — Z7982 Long term (current) use of aspirin: Secondary | ICD-10-CM | POA: Diagnosis not present

## 2023-07-30 DIAGNOSIS — Z556 Problems related to health literacy: Secondary | ICD-10-CM | POA: Diagnosis not present

## 2023-07-30 DIAGNOSIS — N390 Urinary tract infection, site not specified: Secondary | ICD-10-CM | POA: Diagnosis not present

## 2023-07-30 DIAGNOSIS — G8929 Other chronic pain: Secondary | ICD-10-CM | POA: Diagnosis not present

## 2023-07-30 DIAGNOSIS — G43109 Migraine with aura, not intractable, without status migrainosus: Secondary | ICD-10-CM | POA: Diagnosis not present

## 2023-07-30 DIAGNOSIS — I2510 Atherosclerotic heart disease of native coronary artery without angina pectoris: Secondary | ICD-10-CM | POA: Diagnosis not present

## 2023-07-30 DIAGNOSIS — Z87891 Personal history of nicotine dependence: Secondary | ICD-10-CM | POA: Diagnosis not present

## 2023-07-30 DIAGNOSIS — I252 Old myocardial infarction: Secondary | ICD-10-CM | POA: Diagnosis not present

## 2023-07-30 DIAGNOSIS — Z7902 Long term (current) use of antithrombotics/antiplatelets: Secondary | ICD-10-CM | POA: Diagnosis not present

## 2023-07-30 DIAGNOSIS — I110 Hypertensive heart disease with heart failure: Secondary | ICD-10-CM | POA: Diagnosis not present

## 2023-07-30 DIAGNOSIS — I69351 Hemiplegia and hemiparesis following cerebral infarction affecting right dominant side: Secondary | ICD-10-CM | POA: Diagnosis not present

## 2023-07-30 DIAGNOSIS — D649 Anemia, unspecified: Secondary | ICD-10-CM | POA: Diagnosis not present

## 2023-08-07 DIAGNOSIS — Z556 Problems related to health literacy: Secondary | ICD-10-CM | POA: Diagnosis not present

## 2023-08-07 DIAGNOSIS — G8929 Other chronic pain: Secondary | ICD-10-CM | POA: Diagnosis not present

## 2023-08-07 DIAGNOSIS — E785 Hyperlipidemia, unspecified: Secondary | ICD-10-CM | POA: Diagnosis not present

## 2023-08-07 DIAGNOSIS — Z7982 Long term (current) use of aspirin: Secondary | ICD-10-CM | POA: Diagnosis not present

## 2023-08-07 DIAGNOSIS — I5030 Unspecified diastolic (congestive) heart failure: Secondary | ICD-10-CM | POA: Diagnosis not present

## 2023-08-07 DIAGNOSIS — I69351 Hemiplegia and hemiparesis following cerebral infarction affecting right dominant side: Secondary | ICD-10-CM | POA: Diagnosis not present

## 2023-08-07 DIAGNOSIS — Z87891 Personal history of nicotine dependence: Secondary | ICD-10-CM | POA: Diagnosis not present

## 2023-08-07 DIAGNOSIS — M19041 Primary osteoarthritis, right hand: Secondary | ICD-10-CM | POA: Diagnosis not present

## 2023-08-07 DIAGNOSIS — I110 Hypertensive heart disease with heart failure: Secondary | ICD-10-CM | POA: Diagnosis not present

## 2023-08-07 DIAGNOSIS — D649 Anemia, unspecified: Secondary | ICD-10-CM | POA: Diagnosis not present

## 2023-08-07 DIAGNOSIS — M545 Low back pain, unspecified: Secondary | ICD-10-CM | POA: Diagnosis not present

## 2023-08-07 DIAGNOSIS — J45909 Unspecified asthma, uncomplicated: Secondary | ICD-10-CM | POA: Diagnosis not present

## 2023-08-07 DIAGNOSIS — I252 Old myocardial infarction: Secondary | ICD-10-CM | POA: Diagnosis not present

## 2023-08-07 DIAGNOSIS — Z7902 Long term (current) use of antithrombotics/antiplatelets: Secondary | ICD-10-CM | POA: Diagnosis not present

## 2023-08-07 DIAGNOSIS — E1142 Type 2 diabetes mellitus with diabetic polyneuropathy: Secondary | ICD-10-CM | POA: Diagnosis not present

## 2023-08-07 DIAGNOSIS — I2510 Atherosclerotic heart disease of native coronary artery without angina pectoris: Secondary | ICD-10-CM | POA: Diagnosis not present

## 2023-08-07 DIAGNOSIS — Z9181 History of falling: Secondary | ICD-10-CM | POA: Diagnosis not present

## 2023-08-07 DIAGNOSIS — G43109 Migraine with aura, not intractable, without status migrainosus: Secondary | ICD-10-CM | POA: Diagnosis not present

## 2023-08-07 DIAGNOSIS — N390 Urinary tract infection, site not specified: Secondary | ICD-10-CM | POA: Diagnosis not present

## 2023-08-14 DIAGNOSIS — I5030 Unspecified diastolic (congestive) heart failure: Secondary | ICD-10-CM | POA: Diagnosis not present

## 2023-08-14 DIAGNOSIS — I69351 Hemiplegia and hemiparesis following cerebral infarction affecting right dominant side: Secondary | ICD-10-CM | POA: Diagnosis not present

## 2023-08-14 DIAGNOSIS — I110 Hypertensive heart disease with heart failure: Secondary | ICD-10-CM | POA: Diagnosis not present

## 2023-08-14 DIAGNOSIS — Z7902 Long term (current) use of antithrombotics/antiplatelets: Secondary | ICD-10-CM | POA: Diagnosis not present

## 2023-08-14 DIAGNOSIS — E785 Hyperlipidemia, unspecified: Secondary | ICD-10-CM | POA: Diagnosis not present

## 2023-08-14 DIAGNOSIS — Z556 Problems related to health literacy: Secondary | ICD-10-CM | POA: Diagnosis not present

## 2023-08-14 DIAGNOSIS — N390 Urinary tract infection, site not specified: Secondary | ICD-10-CM | POA: Diagnosis not present

## 2023-08-14 DIAGNOSIS — G43109 Migraine with aura, not intractable, without status migrainosus: Secondary | ICD-10-CM | POA: Diagnosis not present

## 2023-08-14 DIAGNOSIS — Z87891 Personal history of nicotine dependence: Secondary | ICD-10-CM | POA: Diagnosis not present

## 2023-08-14 DIAGNOSIS — E1142 Type 2 diabetes mellitus with diabetic polyneuropathy: Secondary | ICD-10-CM | POA: Diagnosis not present

## 2023-08-14 DIAGNOSIS — G8929 Other chronic pain: Secondary | ICD-10-CM | POA: Diagnosis not present

## 2023-08-14 DIAGNOSIS — Z7982 Long term (current) use of aspirin: Secondary | ICD-10-CM | POA: Diagnosis not present

## 2023-08-14 DIAGNOSIS — I252 Old myocardial infarction: Secondary | ICD-10-CM | POA: Diagnosis not present

## 2023-08-14 DIAGNOSIS — I2510 Atherosclerotic heart disease of native coronary artery without angina pectoris: Secondary | ICD-10-CM | POA: Diagnosis not present

## 2023-08-14 DIAGNOSIS — D649 Anemia, unspecified: Secondary | ICD-10-CM | POA: Diagnosis not present

## 2023-08-14 DIAGNOSIS — M545 Low back pain, unspecified: Secondary | ICD-10-CM | POA: Diagnosis not present

## 2023-08-14 DIAGNOSIS — J45909 Unspecified asthma, uncomplicated: Secondary | ICD-10-CM | POA: Diagnosis not present

## 2023-08-14 DIAGNOSIS — Z9181 History of falling: Secondary | ICD-10-CM | POA: Diagnosis not present

## 2023-08-14 DIAGNOSIS — M19041 Primary osteoarthritis, right hand: Secondary | ICD-10-CM | POA: Diagnosis not present

## 2023-08-19 DIAGNOSIS — I69351 Hemiplegia and hemiparesis following cerebral infarction affecting right dominant side: Secondary | ICD-10-CM | POA: Diagnosis not present

## 2023-08-19 DIAGNOSIS — J45909 Unspecified asthma, uncomplicated: Secondary | ICD-10-CM | POA: Diagnosis not present

## 2023-08-19 DIAGNOSIS — Z556 Problems related to health literacy: Secondary | ICD-10-CM | POA: Diagnosis not present

## 2023-08-19 DIAGNOSIS — I110 Hypertensive heart disease with heart failure: Secondary | ICD-10-CM | POA: Diagnosis not present

## 2023-08-19 DIAGNOSIS — E1142 Type 2 diabetes mellitus with diabetic polyneuropathy: Secondary | ICD-10-CM | POA: Diagnosis not present

## 2023-08-19 DIAGNOSIS — I252 Old myocardial infarction: Secondary | ICD-10-CM | POA: Diagnosis not present

## 2023-08-19 DIAGNOSIS — Z87891 Personal history of nicotine dependence: Secondary | ICD-10-CM | POA: Diagnosis not present

## 2023-08-19 DIAGNOSIS — G43109 Migraine with aura, not intractable, without status migrainosus: Secondary | ICD-10-CM | POA: Diagnosis not present

## 2023-08-19 DIAGNOSIS — Z7902 Long term (current) use of antithrombotics/antiplatelets: Secondary | ICD-10-CM | POA: Diagnosis not present

## 2023-08-19 DIAGNOSIS — G8929 Other chronic pain: Secondary | ICD-10-CM | POA: Diagnosis not present

## 2023-08-19 DIAGNOSIS — I2510 Atherosclerotic heart disease of native coronary artery without angina pectoris: Secondary | ICD-10-CM | POA: Diagnosis not present

## 2023-08-19 DIAGNOSIS — E785 Hyperlipidemia, unspecified: Secondary | ICD-10-CM | POA: Diagnosis not present

## 2023-08-19 DIAGNOSIS — Z9181 History of falling: Secondary | ICD-10-CM | POA: Diagnosis not present

## 2023-08-19 DIAGNOSIS — M545 Low back pain, unspecified: Secondary | ICD-10-CM | POA: Diagnosis not present

## 2023-08-19 DIAGNOSIS — D649 Anemia, unspecified: Secondary | ICD-10-CM | POA: Diagnosis not present

## 2023-08-19 DIAGNOSIS — I5030 Unspecified diastolic (congestive) heart failure: Secondary | ICD-10-CM | POA: Diagnosis not present

## 2023-08-19 DIAGNOSIS — M19041 Primary osteoarthritis, right hand: Secondary | ICD-10-CM | POA: Diagnosis not present

## 2023-08-19 DIAGNOSIS — Z7982 Long term (current) use of aspirin: Secondary | ICD-10-CM | POA: Diagnosis not present

## 2023-08-19 DIAGNOSIS — N390 Urinary tract infection, site not specified: Secondary | ICD-10-CM | POA: Diagnosis not present

## 2023-08-23 ENCOUNTER — Ambulatory Visit: Payer: Medicare (Managed Care) | Admitting: Cardiology

## 2023-08-26 DIAGNOSIS — M19041 Primary osteoarthritis, right hand: Secondary | ICD-10-CM | POA: Diagnosis not present

## 2023-08-26 DIAGNOSIS — I2510 Atherosclerotic heart disease of native coronary artery without angina pectoris: Secondary | ICD-10-CM | POA: Diagnosis not present

## 2023-08-26 DIAGNOSIS — Z7982 Long term (current) use of aspirin: Secondary | ICD-10-CM | POA: Diagnosis not present

## 2023-08-26 DIAGNOSIS — Z556 Problems related to health literacy: Secondary | ICD-10-CM | POA: Diagnosis not present

## 2023-08-26 DIAGNOSIS — G8929 Other chronic pain: Secondary | ICD-10-CM | POA: Diagnosis not present

## 2023-08-26 DIAGNOSIS — D649 Anemia, unspecified: Secondary | ICD-10-CM | POA: Diagnosis not present

## 2023-08-26 DIAGNOSIS — I252 Old myocardial infarction: Secondary | ICD-10-CM | POA: Diagnosis not present

## 2023-08-26 DIAGNOSIS — I5030 Unspecified diastolic (congestive) heart failure: Secondary | ICD-10-CM | POA: Diagnosis not present

## 2023-08-26 DIAGNOSIS — M545 Low back pain, unspecified: Secondary | ICD-10-CM | POA: Diagnosis not present

## 2023-08-26 DIAGNOSIS — E785 Hyperlipidemia, unspecified: Secondary | ICD-10-CM | POA: Diagnosis not present

## 2023-08-26 DIAGNOSIS — N390 Urinary tract infection, site not specified: Secondary | ICD-10-CM | POA: Diagnosis not present

## 2023-08-26 DIAGNOSIS — I110 Hypertensive heart disease with heart failure: Secondary | ICD-10-CM | POA: Diagnosis not present

## 2023-08-26 DIAGNOSIS — J45909 Unspecified asthma, uncomplicated: Secondary | ICD-10-CM | POA: Diagnosis not present

## 2023-08-26 DIAGNOSIS — I69351 Hemiplegia and hemiparesis following cerebral infarction affecting right dominant side: Secondary | ICD-10-CM | POA: Diagnosis not present

## 2023-08-26 DIAGNOSIS — Z9181 History of falling: Secondary | ICD-10-CM | POA: Diagnosis not present

## 2023-08-26 DIAGNOSIS — E1142 Type 2 diabetes mellitus with diabetic polyneuropathy: Secondary | ICD-10-CM | POA: Diagnosis not present

## 2023-08-26 DIAGNOSIS — G43109 Migraine with aura, not intractable, without status migrainosus: Secondary | ICD-10-CM | POA: Diagnosis not present

## 2023-08-26 DIAGNOSIS — Z87891 Personal history of nicotine dependence: Secondary | ICD-10-CM | POA: Diagnosis not present

## 2023-08-26 DIAGNOSIS — Z7902 Long term (current) use of antithrombotics/antiplatelets: Secondary | ICD-10-CM | POA: Diagnosis not present

## 2023-10-17 ENCOUNTER — Ambulatory Visit (INDEPENDENT_AMBULATORY_CARE_PROVIDER_SITE_OTHER)
Admission: RE | Admit: 2023-10-17 | Discharge: 2023-10-17 | Disposition: A | Discharge status: Home / Self Care | Source: Ambulatory Visit | Attending: Nurse Practitioner

## 2023-10-17 ENCOUNTER — Ambulatory Visit (INDEPENDENT_AMBULATORY_CARE_PROVIDER_SITE_OTHER): Payer: Medicare (Managed Care) | Admitting: Nurse Practitioner

## 2023-10-17 ENCOUNTER — Encounter: Payer: Self-pay | Admitting: Nurse Practitioner

## 2023-10-17 VITALS — BP 100/76 | HR 77 | Temp 97.9°F | Ht 66.5 in | Wt 140.0 lb

## 2023-10-17 DIAGNOSIS — I10 Essential (primary) hypertension: Secondary | ICD-10-CM | POA: Diagnosis not present

## 2023-10-17 DIAGNOSIS — M25561 Pain in right knee: Secondary | ICD-10-CM | POA: Diagnosis not present

## 2023-10-17 DIAGNOSIS — Z794 Long term (current) use of insulin: Secondary | ICD-10-CM

## 2023-10-17 DIAGNOSIS — N390 Urinary tract infection, site not specified: Secondary | ICD-10-CM | POA: Diagnosis not present

## 2023-10-17 DIAGNOSIS — Z13820 Encounter for screening for osteoporosis: Secondary | ICD-10-CM

## 2023-10-17 DIAGNOSIS — R296 Repeated falls: Secondary | ICD-10-CM | POA: Diagnosis not present

## 2023-10-17 DIAGNOSIS — E119 Type 2 diabetes mellitus without complications: Secondary | ICD-10-CM | POA: Diagnosis not present

## 2023-10-17 DIAGNOSIS — Z8673 Personal history of transient ischemic attack (TIA), and cerebral infarction without residual deficits: Secondary | ICD-10-CM | POA: Insufficient documentation

## 2023-10-17 DIAGNOSIS — Z1231 Encounter for screening mammogram for malignant neoplasm of breast: Secondary | ICD-10-CM

## 2023-10-17 LAB — LIPID PANEL
Cholesterol: 171 mg/dL (ref 0–200)
HDL: 60.5 mg/dL (ref >39.00)
LDL Cholesterol: 78 mg/dL (ref 0–99)
NonHDL: 110.19
Total CHOL/HDL Ratio: 3
Triglycerides: 159 mg/dL — ABNORMAL HIGH (ref 0.0–149.0)
VLDL: 31.8 mg/dL (ref 0.0–40.0)

## 2023-10-17 LAB — COMPREHENSIVE METABOLIC PANEL WITH GFR
ALT: 17 U/L (ref 0–35)
AST: 15 U/L (ref 0–37)
Albumin: 4.2 g/dL (ref 3.5–5.2)
Alkaline Phosphatase: 87 U/L (ref 39–117)
BUN: 31 mg/dL — ABNORMAL HIGH (ref 6–23)
CO2: 31 meq/L (ref 19–32)
Calcium: 10.4 mg/dL (ref 8.4–10.5)
Chloride: 102 meq/L (ref 96–112)
Creatinine, Ser: 0.97 mg/dL (ref 0.40–1.20)
GFR: 60.18 mL/min (ref >60.00)
Glucose, Bld: 219 mg/dL — ABNORMAL HIGH (ref 70–99)
Potassium: 4.4 meq/L (ref 3.5–5.1)
Sodium: 139 meq/L (ref 135–145)
Total Bilirubin: 0.7 mg/dL (ref 0.2–1.2)
Total Protein: 7 g/dL (ref 6.0–8.3)

## 2023-10-17 LAB — CBC
HCT: 34.4 % — ABNORMAL LOW (ref 36.0–46.0)
Hemoglobin: 11.5 g/dL — ABNORMAL LOW (ref 12.0–15.0)
MCHC: 33.4 g/dL (ref 30.0–36.0)
MCV: 84.6 fl (ref 78.0–100.0)
Platelets: 301 10*3/uL (ref 150.0–400.0)
RBC: 4.06 Mil/uL (ref 3.87–5.11)
RDW: 13.9 % (ref 11.5–15.5)
WBC: 6.4 10*3/uL (ref 4.0–10.5)

## 2023-10-17 LAB — TSH: TSH: 2.54 u[IU]/mL (ref 0.35–5.50)

## 2023-10-17 LAB — HEMOGLOBIN A1C: Hgb A1c MFr Bld: 8.4 % — ABNORMAL HIGH (ref 4.6–6.5)

## 2023-10-17 NOTE — Assessment & Plan Note (Signed)
 History of the same.  We will send off for urinalysis with reflexive culture.  If positive patient will need referral to urology.

## 2023-10-17 NOTE — Assessment & Plan Note (Signed)
 2 calls in the past 2 weeks.  Mechanical in nature.  Likely secondary from stroke that affected right side.  Patient has done PT in the past ambulatory furl to PT today.  Also consider OT given patient has difficulty getting on socks and shoes on the right lower extremity

## 2023-10-17 NOTE — Progress Notes (Signed)
 New Patient Office Visit  Subjective    Patient ID: [name], female    DOB: 09/09/1955  Age: 68 y.o. MRN: [id number]  CC:  Chief Complaint  Patient presents with   Establish Care    Discuss mammogram referral. Prefers [facility] in [place]. Declines vaccines.     HPI [name] presents to establish care   HTN: patient is currently on losartan  100 mg daily. Does not check BP at home.  Patient tolerates medication well denies lightheadedness or dizziness  DM2: 20 units of tresiba  daily. Does check her gluocse once a month. States that she does not check regularly.  Patient has had 1 episode of hypoglycemia where she was sent to the hospital. On statin On ARB  CVA: atorvastatin  40mg  and baby ASA. Statse that the stroke affected her right side. States that she does not bring her right foot up enough when she walks sometimes causing her to fall  Colonoscopy: States that it has been within 3 years and was negative    Mammogram: needs updating    Fall in the parking: states that she has fallen twice. States that she was getting out of the vehcile. States that she landed on her back States that she fell Tuesday night. [name] on her back states that she did hit her head States that her right knee has been huritn since the original fall. States that she has been having pain with the right knee.  States it is worse with weightbearing.  Feels okay at rest  DEXA: Needs updating order placed today    Outpatient Encounter Medications as of 10/17/2023  Medication Sig   aspirin  EC 81 MG tablet Take 81 mg by mouth daily.   clopidogrel  (PLAVIX ) 75 MG tablet Take 1 tablet (75 mg total) by mouth daily.   insulin  degludec (TRESIBA  FLEXTOUCH) 200 UNIT/ML FlexTouch Pen Inject 20 Units into the skin at bedtime.   atorvastatin  (LIPITOR) 40 MG tablet Take 1 tablet (40 mg total) by mouth daily at 6 PM.   losartan  (COZAAR ) 100 MG tablet Take 100 mg by mouth daily. (Patient not  taking: Reported on 10/17/2023)   meclizine  (ANTIVERT ) 12.5 MG tablet Take 1 tablet (12.5 mg total) by mouth 3 (three) times daily as needed for dizziness. (Patient not taking: Reported on 10/17/2023)   No facility-administered encounter medications on file as of 10/17/2023.    Past Medical History:  Diagnosis Date   Anxiety    a. with claustrophobia   Arthritis    "right hand; maybe just starting in my left hand" (04/21/2012)   Asthma    CAD (coronary artery disease)    a. PCI [place]  2004. b. Angina - balloon angioplasty to LAD 04/21/12 (vessel too small for stent).   Carotid stenosis    a. 11/2011 critical R, ~30% L by angiogram;  b. 04/2012 s/p R CEA.   Cataract    left   Chronic lower back pain    Depression    a. occasionally takes st john's wart   Diabetes mellitus type 2, uncontrolled    Dysrhythmia    Embolic stroke (HCC)    a. 11/2011 MRI: mult small b/l infarcts c/w emboli;  b. 11/2011 MRA neck: high grade RICA stenosis;  c. 6/20013 TEE: EF 55-60% with mild LVH, severe MAC, < 1 cm calc mobile structure on the MV annulus, ? 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 Thought was RICA stenosis did not explain the b/l small  strokes; concern for emboli from mobile MV struct;  d. 01/2012 Event Mon:  no AFib   History of echocardiogram    Echo 12/16: mild LVH, EF 55-60%, no RWMA, Gr 2 DD, trivial AI, MAC, mild MR, mild LAE   History of kidney stones    History of nuclear stress test    a. Myoview  12/16: EF 46%, no ischemia, Intermediate Risk (low EF)   HLD (hyperlipidemia)    a. 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 visual changes   Myocardial infarction (HCC)    prior to 2013   Peripheral neuropathy    Refusal of blood transfusions as patient is Jehovah's Witness    Stroke (HCC) 2015- dec   mini stroke   Uncontrolled type 2 diabetes mellitus with circulatory disorder, with long-term current use of insulin  05/10/2015     Past Surgical History:  Procedure Laterality Date   ANKLE SURGERY  2005   after fall down stairs, left with screws and plate in place   CARDIAC CATHETERIZATION  2004   WNL, no significant coronary disease   CARDIAC CATHETERIZATION  12/19/11   CAROTID ANGIOGRAM Bilateral 12/19/2011   Procedure: CAROTID ANGIOGRAM;  Surgeon: Margherita Shell, MD;  Location: [facility] CATH LAB;  Service: Cardiovascular;  Laterality: Bilateral;   CAROTID ENDARTERECTOMY     CARPAL TUNNEL RELEASE  ~ 2005   bilateral   CATARACT EXTRACTION W/ INTRAOCULAR LENS IMPLANT  2012   right   CATARACT EXTRACTION W/PHACO Left 10/2015   Dr Faylene Hoots at [facility]   CORONARY ANGIOPLASTY  04/21/2012   95% stenosis mid LAD, vessel not large enough for stent, rec plavix /ASA for 1 mo   CORONARY ANGIOPLASTY WITH STENT PLACEMENT  2002   x2   ENDARTERECTOMY  05/09/2012   Procedure: ENDARTERECTOMY CAROTID;  Surgeon: Margherita Shell, MD;  Location: [facility] OR;  Service: Vascular;  Laterality: Right;  Right carotd endarterectomy with bovine patch angioplasty   ENDARTERECTOMY Left 06/23/2015   Procedure: ENDARTERECTOMY CAROTID;  Surgeon: Margherita Shell, MD;  Location: [facility] OR;  Service: Vascular;  Laterality: Left;   EYE SURGERY Left 04/2015   laser surgery,blood vessels rupturing   holter monitor  2013   WNL   INTRAMEDULLARY (IM) NAIL INTERTROCHANTERIC Right 02/15/2023   Procedure: INTRAMEDULLARY NAILING RIGHT HIP;  Surgeon: Laneta Pintos, MD;  Location: [facility] OR;  Service: Orthopedics;  Laterality: Right;   KIDNEY STONE SURGERY     several times   LITHOTRIPSY     LOOP RECORDER INSERTION N/A 07/22/2019   Procedure: LOOP RECORDER INSERTION;  Surgeon: Percival Brace, MD;  Location: [facility] INVASIVE CV LAB;  Service: Cardiovascular;  Laterality: N/A;   PARS PLANA VITRECTOMY Left 04/2015   [facility] Dr Abelina Abide)   [facility] ANGIOPLASTY Left 06/23/2015   Procedure: PATCH ANGIOPLASTY USING 1cm x 4cm BOVINE PERICARDIAL PATCH;  Surgeon: Margherita Shell, MD;   Location: [facility] OR;  Service: Vascular;  Laterality: Left;   PERCUTANEOUS CORONARY STENT INTERVENTION (PCI-S) N/A 04/21/2012   Procedure: PERCUTANEOUS CORONARY STENT INTERVENTION (PCI-S);  Surgeon: Odie Benne, MD;  Location: [facility] CATH LAB;  Service: Cardiovascular;  Laterality: N/A;   TEE WITHOUT CARDIOVERSION  12/14/2011   Procedure: TRANSESOPHAGEAL ECHOCARDIOGRAM (TEE);  Surgeon: Darlis Eisenmenger, MD;  Location: [facility] ENDOSCOPY;  Service: Cardiovascular;  Laterality: N/A;   US  ECHOCARDIOGRAPHY  2002   normal LV fxn, mild LVH, borderline L atrial size    Family History  Problem  Relation Age of Onset   Heart disease Mother        valve problems   Stroke Mother    Hypertension Mother    Heart disease Sister        valve problems   Heart disease Brother    Diabetes Father    Hypertension Father    AAA (abdominal aortic aneurysm) Father    Cancer Neg Hx     Social History   Socioeconomic History   Marital status: Married    Spouse name: Bambi Lever   Number of children: Not on file   Years of education: Not on file   Highest education level: Not on file  Occupational History   Not on file  Tobacco Use   Smoking status: Former    Current packs/day: 0.00    Types: Cigarettes    Quit date: 06/25/1972    Years since quitting: 51.3   Smokeless tobacco: Never   Tobacco comments:    4 years of smoking   Vaping Use   Vaping status: Never Used  Substance and Sexual Activity   Alcohol use: No    Comment: none since 11/2011   Drug use: No   Sexual activity: Yes    Birth control/protection: Post-menopausal  Other Topics Concern   Not on file  Social History Narrative   Caffeine: 2 cups/day coffee   Lives alone, husband in [place] .  4 children nearby, son to live with her.   Occupation: unemployed, prior worked as IT consultant   Activity: stays active on farm   Diet: vegan, no processed foods, good water daily, fruits/vegetables daily   jehovah's witness - no blood products.    Social Drivers of Corporate investment banker Strain: Not on file  Food Insecurity: No Food Insecurity (05/11/2023)   Hunger Vital Sign    Worried About Running Out of Food in the Last Year: Never true    Ran Out of Food in the Last Year: Never true  Transportation Needs: No Transportation Needs (05/11/2023)   PRAPARE - Administrator, Civil Service (Medical): No    Lack of Transportation (Non-Medical): No  Physical Activity: Not on file  Stress: Not on file  Social Connections: Not on file  Intimate Partner Violence: Not At Risk (05/11/2023)   Humiliation, Afraid, Rape, and Kick questionnaire    Fear of Current or Ex-Partner: No    Emotionally Abused: No    Physically Abused: No    Sexually Abused: No    Review of Systems  Constitutional:  Negative for chills and fever.  Respiratory:  Negative for shortness of breath.   Cardiovascular:  Negative for chest pain and leg swelling.  Gastrointestinal:  Negative for abdominal pain, blood in stool, constipation, diarrhea, nausea and vomiting.  Genitourinary:  Negative for dysuria and hematuria.  Musculoskeletal:  Positive for joint pain.  Neurological:  Positive for weakness. Negative for tingling and headaches.  Psychiatric/Behavioral:  Negative for hallucinations and suicidal ideas.         Objective    BP 100/76   Pulse 77   Temp 97.9 F (36.6 C) (Oral)   Ht 5' 6.5" (1.689 m) Comment: per patient  Wt 140 lb (63.5 kg) Comment: per patient  SpO2 95%   BMI 22.26 kg/m   Physical Exam Vitals and nursing note reviewed.  Constitutional:      Appearance: Normal appearance.  HENT:     Right Ear: Tympanic membrane, ear canal and external ear normal.  Left Ear: Tympanic membrane, ear canal and external ear normal.     Mouth/Throat:     Mouth: Mucous membranes are moist.     Pharynx: Oropharynx is clear.  Eyes:     Extraocular Movements: Extraocular movements intact.     Pupils: Pupils are equal, round,  and reactive to light.  Cardiovascular:     Rate and Rhythm: Normal rate and regular rhythm.     Pulses: Normal pulses.     Heart sounds: Normal heart sounds.  Pulmonary:     Effort: Pulmonary effort is normal.     Breath sounds: Normal breath sounds.  Abdominal:     General: Bowel sounds are normal. There is no distension.     Palpations: There is no mass.     Tenderness: There is no abdominal tenderness.     Hernia: No hernia is present.  Musculoskeletal:        General: No tenderness.     Right lower leg: No edema.     Left lower leg: No edema.  Lymphadenopathy:     Cervical: No cervical adenopathy.  Skin:    General: Skin is warm.  Neurological:     General: No focal deficit present.     Mental Status: She is alert.     Deep Tendon Reflexes:     Reflex Scores:      Bicep reflexes are 2+ on the right side and 2+ on the left side.      Patellar reflexes are 2+ on the right side and 2+ on the left side.    Comments: Bilateral upper and lower extremity strength 5/5  Psychiatric:        Mood and Affect: Mood normal.        Behavior: Behavior normal.        Thought Content: Thought content normal.        Judgment: Judgment normal.         Assessment & Plan:   Problem List Items Addressed This Visit       Cardiovascular and Mediastinum   HTN (hypertension)   History of the same.  Patient currently maintained on losartan  100 mg he does not check blood pressure at home.  Patient tolerates medication well.  Blood pressure well-controlled in office continue medication as prescribed      Relevant Orders   CBC   Comprehensive metabolic panel with GFR   TSH     Endocrine   Insulin  dependent type 2 diabetes mellitus (HCC)   Patient currently maintained on Tresiba  20 units daily.  Does not check blood glucose at home very frequently.  Has had 1 episode of hypoglycemia.  Pending A1c today continue Tresiba  20 units daily      Relevant Orders   CBC   Comprehensive  metabolic panel with GFR   Hemoglobin A1c   Lipid panel   Microalbumin / creatinine urine ratio     Genitourinary   Recurrent UTI   History of the same.  We will send off for urinalysis with reflexive culture.  If positive patient will need referral to urology.      Relevant Orders   Urinalysis, Routine w reflex microscopic     Other   Multiple falls - Primary   2 calls in the past 2 weeks.  Mechanical in nature.  Likely secondary from stroke that affected right side.  Patient has done PT in the past ambulatory furl to PT today.  Also consider OT given patient has  difficulty getting on socks and shoes on the right lower extremity      Relevant Orders   Ambulatory referral to Physical Therapy   History of stroke   History of the same.  Patient is on blood pressure medication and atorvastatin  40 mg daily.  Right side was affected      Relevant Orders   Hemoglobin A1c   Lipid panel   Acute pain of right knee   Status post original fall.  No tenderness on exam pending knee x-ray today      Relevant Orders   DG Knee Complete 4 Views Right   Other Visit Diagnoses       Screening mammogram for breast cancer       Relevant Orders   MM 3D SCREENING MAMMOGRAM BILATERAL BREAST     Screening for osteoporosis       Relevant Orders   DG Bone Density       Return in about 3 months (around 01/16/2024) for DM recheck.   Margarie Shay, NP

## 2023-10-17 NOTE — Assessment & Plan Note (Signed)
 History of the same.  Patient currently maintained on losartan  100 mg he does not check blood pressure at home.  Patient tolerates medication well.  Blood pressure well-controlled in office continue medication as prescribed

## 2023-10-17 NOTE — Assessment & Plan Note (Signed)
 Patient currently maintained on Tresiba  20 units daily.  Does not check blood glucose at home very frequently.  Has had 1 episode of hypoglycemia.  Pending A1c today continue Tresiba  20 units daily

## 2023-10-17 NOTE — Assessment & Plan Note (Signed)
 Status post original fall.  No tenderness on exam pending knee x-ray today

## 2023-10-17 NOTE — Patient Instructions (Signed)
 Nice to see you today I will be in touch with labs once I have reviewed them Follow up with me in 3 months, sooner if you need me

## 2023-10-17 NOTE — Assessment & Plan Note (Signed)
 History of the same.  Patient is on blood pressure medication and atorvastatin  40 mg daily.  Right side was affected

## 2023-10-17 NOTE — Addendum Note (Signed)
 Addended by: Gerry Krone on: 10/17/2023 04:26 PM   Modules accepted: Orders

## 2023-10-18 ENCOUNTER — Other Ambulatory Visit: Payer: Self-pay

## 2023-10-18 ENCOUNTER — Other Ambulatory Visit: Payer: Self-pay | Admitting: Nurse Practitioner

## 2023-10-18 ENCOUNTER — Encounter: Payer: Self-pay | Admitting: Nurse Practitioner

## 2023-10-18 DIAGNOSIS — N390 Urinary tract infection, site not specified: Secondary | ICD-10-CM

## 2023-10-18 DIAGNOSIS — E119 Type 2 diabetes mellitus without complications: Secondary | ICD-10-CM

## 2023-10-18 LAB — URINALYSIS, ROUTINE W REFLEX MICROSCOPIC
Bilirubin Urine: NEGATIVE
Hgb urine dipstick: NEGATIVE
Ketones, ur: NEGATIVE
Nitrite: POSITIVE — AB
Specific Gravity, Urine: 1.02 (ref 1.000–1.030)
Total Protein, Urine: NEGATIVE
Urine Glucose: 100 — AB
Urobilinogen, UA: 0.2 (ref 0.0–1.0)
pH: 6 (ref 5.0–8.0)

## 2023-10-18 LAB — MICROALBUMIN / CREATININE URINE RATIO
Creatinine,U: 102.5 mg/dL
Microalb Creat Ratio: 49 mg/g — ABNORMAL HIGH (ref 0.0–30.0)
Microalb, Ur: 5 mg/dL — ABNORMAL HIGH (ref 0.0–1.9)

## 2023-10-18 MED ORDER — EMPAGLIFLOZIN 10 MG PO TABS: 10.0000 mg | ORAL_TABLET | Freq: Every day | ORAL | 1 refills | Status: AC

## 2023-10-21 ENCOUNTER — Other Ambulatory Visit: Payer: Self-pay | Admitting: Nurse Practitioner

## 2023-10-21 ENCOUNTER — Encounter: Payer: Self-pay | Admitting: Nurse Practitioner

## 2023-10-21 DIAGNOSIS — N3000 Acute cystitis without hematuria: Secondary | ICD-10-CM

## 2023-10-21 DIAGNOSIS — N390 Urinary tract infection, site not specified: Secondary | ICD-10-CM

## 2023-10-21 MED ORDER — NITROFURANTOIN MONOHYD MACRO 100 MG PO CAPS: 100.0000 mg | ORAL_CAPSULE | Freq: Two times a day (BID) | ORAL | 0 refills | Status: AC

## 2023-10-24 ENCOUNTER — Encounter: Payer: Self-pay | Admitting: Nurse Practitioner

## 2023-11-11 ENCOUNTER — Telehealth: Payer: Self-pay

## 2023-11-11 NOTE — Telephone Encounter (Signed)
 Called and spoke with patients daughter, answered all questions. Nothing further needed at this time.

## 2023-11-11 NOTE — Telephone Encounter (Signed)
 Copied from CRM [id number]. Topic: Clinical - Lab/Test Results >> Nov 11, 2023 10:45 AM Albertha Alosa wrote: Reason for CRM: Patient daughter, Awanda Bogaert called in regarding the letter that she received regarding patients lab results, would like for someone to give her a callback  [phone]

## 2023-11-26 ENCOUNTER — Other Ambulatory Visit: Payer: Self-pay | Admitting: Cardiology

## 2024-01-16 ENCOUNTER — Ambulatory Visit: Admitting: Nurse Practitioner

## 2024-01-17 ENCOUNTER — Other Ambulatory Visit: Payer: Self-pay | Admitting: Cardiology

## 2024-02-03 ENCOUNTER — Ambulatory Visit: Admitting: Nurse Practitioner

## 2024-02-04 ENCOUNTER — Telehealth: Payer: Self-pay | Admitting: Nurse Practitioner

## 2024-02-04 DIAGNOSIS — R296 Repeated falls: Secondary | ICD-10-CM

## 2024-02-04 NOTE — Telephone Encounter (Signed)
 Called and let them know should be getting any time and to reach out if not received.

## 2024-02-04 NOTE — Telephone Encounter (Signed)
 Referral placed.

## 2024-02-04 NOTE — Telephone Encounter (Signed)
 Copied from CRM [id number]. Topic: Referral - Status >> Feb 04, 2024  9:22 AM Gennette ORN wrote: Reason for CRM: [facility] [phone] is calling because patient is needing an updated referral the last one they have is from April , please send updated one.

## 2024-02-07 ENCOUNTER — Ambulatory Visit

## 2024-02-07 ENCOUNTER — Ambulatory Visit (INDEPENDENT_AMBULATORY_CARE_PROVIDER_SITE_OTHER): Admitting: Nurse Practitioner

## 2024-02-07 VITALS — BP 112/72 | Ht 66.5 in | Wt 151.9 lb

## 2024-02-07 VITALS — BP 112/72 | HR 90 | Temp 98.1°F | Ht 66.5 in | Wt 151.6 lb

## 2024-02-07 DIAGNOSIS — Z1211 Encounter for screening for malignant neoplasm of colon: Secondary | ICD-10-CM | POA: Diagnosis not present

## 2024-02-07 DIAGNOSIS — E119 Type 2 diabetes mellitus without complications: Secondary | ICD-10-CM

## 2024-02-07 DIAGNOSIS — Z0000 Encounter for general adult medical examination without abnormal findings: Secondary | ICD-10-CM | POA: Diagnosis not present

## 2024-02-07 DIAGNOSIS — Z794 Long term (current) use of insulin: Secondary | ICD-10-CM | POA: Diagnosis not present

## 2024-02-07 LAB — POCT GLYCOSYLATED HEMOGLOBIN (HGB A1C): Hemoglobin A1C: 6.9 % — AB (ref 4.0–5.6)

## 2024-02-07 LAB — BASIC METABOLIC PANEL WITH GFR
BUN: 31 mg/dL — ABNORMAL HIGH (ref 6–23)
CO2: 32 meq/L (ref 19–32)
Calcium: 11.2 mg/dL — ABNORMAL HIGH (ref 8.4–10.5)
Chloride: 104 meq/L (ref 96–112)
Creatinine, Ser: 1.1 mg/dL (ref 0.40–1.20)
GFR: 51.64 mL/min — ABNORMAL LOW (ref >60.00)
Glucose, Bld: 164 mg/dL — ABNORMAL HIGH (ref 70–99)
Potassium: 4.5 meq/L (ref 3.5–5.1)
Sodium: 140 meq/L (ref 135–145)

## 2024-02-07 NOTE — Progress Notes (Addendum)
 Subjective:   [name] is a 68 y.o. who presents for a Medicare Wellness preventive visit.  As a reminder, Annual Wellness Visits don't include a physical exam, and some assessments may be limited, especially if this visit is performed virtually. We may recommend an in-person follow-up visit with your provider if needed.  Visit Complete: In person  Persons Participating in Visit: Patient assisted by daughter/[name].  AWV Questionnaire: No: Patient Medicare AWV questionnaire was not completed prior to this visit.  Cardiac Risk Factors include: advanced age (>43men, >27 women);diabetes mellitus;dyslipidemia;hypertension;sedentary lifestyle     Objective:    Today's Vitals   02/07/24 1305 02/07/24 1307  BP: 112/72   Weight: 151 lb 14.4 oz (68.9 kg)   Height: 5' 6.5 (1.689 m)   PainSc:  1    Body mass index is 24.15 kg/m.     02/07/2024    1:22 PM 06/10/2023   11:50 AM 05/11/2023   10:10 AM 02/15/2023   10:59 AM 02/15/2023    6:02 AM 07/22/2019    7:40 AM 03/17/2019    8:17 PM  Advanced Directives  Does Patient Have a Medical Advance Directive? No No No No Yes No No  Type of Advance Directive     Living will;Healthcare Power of Attorney    Does patient want to make changes to medical advance directive?     No - Patient declined    Copy of Healthcare Power of Attorney in Chart?     No - copy requested    Would patient like information on creating a medical advance directive?    No - Patient declined   No - Patient declined    Current Medications (verified) Outpatient Encounter Medications as of 02/07/2024  Medication Sig   aspirin  EC 81 MG tablet Take 81 mg by mouth daily.   atorvastatin  (LIPITOR) 40 MG tablet Take 1 tablet (40 mg total) by mouth daily at 6 PM.   clopidogrel  (PLAVIX ) 75 MG tablet TAKE 1 TABLET BY MOUTH EVERY DAY   empagliflozin  (JARDIANCE ) 10 MG TABS tablet Take 1 tablet (10 mg total) by mouth daily before breakfast.   insulin  degludec (TRESIBA   FLEXTOUCH) 200 UNIT/ML FlexTouch Pen Inject 20 Units into the skin at bedtime.   losartan  (COZAAR ) 100 MG tablet Take 100 mg by mouth daily.   meclizine  (ANTIVERT ) 12.5 MG tablet Take 1 tablet (12.5 mg total) by mouth 3 (three) times daily as needed for dizziness. (Patient not taking: Reported on 02/07/2024)   nitrofurantoin , macrocrystal-monohydrate, (MACROBID ) 100 MG capsule Take 1 capsule (100 mg total) by mouth 2 (two) times daily. (Patient not taking: Reported on 02/07/2024)   No facility-administered encounter medications on file as of 02/07/2024.    Allergies (verified) Other, Penicillins, Toradol  [ketorolac  tromethamine ], Imdur  [isosorbide  nitrate], Latex, and Metformin and related   History: Past Medical History:  Diagnosis Date   Anxiety    a. with claustrophobia   Arthritis    right hand; maybe just starting in my left hand (04/21/2012)   Asthma    CAD (coronary artery disease)    a. PCI [place]  2004. b. Angina - balloon angioplasty to LAD 04/21/12 (vessel too small for stent).   Carotid stenosis    a. 11/2011 critical R, ~30% L by angiogram;  b. 04/2012 s/p R CEA.   Cataract    left   Chronic lower back pain    Depression    a. occasionally takes st john's wart   Diabetes mellitus type 2,  uncontrolled    Dysrhythmia    Embolic stroke (HCC)    a. 11/2011 MRI: mult small b/l infarcts c/w emboli;  b. 11/2011 MRA neck: high grade RICA stenosis;  c. 6/20013 TEE: EF 55-60% with mild LVH, severe MAC, < 1 cm calc mobile structure on the MV annulus, ? 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 Thought was RICA stenosis did not explain the b/l small strokes; concern for emboli from mobile MV struct;  d. 01/2012 Event Mon:  no AFib   History of echocardiogram    Echo 12/16: mild LVH, EF 55-60%, no RWMA, Gr 2 DD, trivial AI, MAC, mild MR, mild LAE   History of kidney stones    History of nuclear stress test    a. Myoview  12/16: EF 46%, no ischemia, Intermediate Risk (low  EF)   HLD (hyperlipidemia)    a. 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 visual changes   Myocardial infarction (HCC)    prior to 2013   Peripheral neuropathy    Refusal of blood transfusions as patient is Jehovah's Witness    Stroke (HCC) 2015- dec   mini stroke   Uncontrolled type 2 diabetes mellitus with circulatory disorder, with long-term current use of insulin  05/10/2015   Past Surgical History:  Procedure Laterality Date   ANKLE SURGERY  2005   after fall down stairs, left with screws and plate in place   CARDIAC CATHETERIZATION  2004   WNL, no significant coronary disease   CARDIAC CATHETERIZATION  12/19/11   CAROTID ANGIOGRAM Bilateral 12/19/2011   Procedure: CAROTID ANGIOGRAM;  Surgeon: Gaile LELON New, MD;  Location: [facility] CATH LAB;  Service: Cardiovascular;  Laterality: Bilateral;   CAROTID ENDARTERECTOMY     CARPAL TUNNEL RELEASE  ~ 2005   bilateral   CATARACT EXTRACTION W/ INTRAOCULAR LENS IMPLANT  2012   right   CATARACT EXTRACTION W/PHACO Left 10/2015   Dr Gust at [facility]   CORONARY ANGIOPLASTY  04/21/2012   95% stenosis mid LAD, vessel not large enough for stent, rec plavix /ASA for 1 mo   CORONARY ANGIOPLASTY WITH STENT PLACEMENT  2002   x2   ENDARTERECTOMY  05/09/2012   Procedure: ENDARTERECTOMY CAROTID;  Surgeon: Gaile LELON New, MD;  Location: [facility] OR;  Service: Vascular;  Laterality: Right;  Right carotd endarterectomy with bovine patch angioplasty   ENDARTERECTOMY Left 06/23/2015   Procedure: ENDARTERECTOMY CAROTID;  Surgeon: Gaile LELON New, MD;  Location: [facility] OR;  Service: Vascular;  Laterality: Left;   EYE SURGERY Left 04/2015   laser surgery,blood vessels rupturing   holter monitor  2013   WNL   INTRAMEDULLARY (IM) NAIL INTERTROCHANTERIC Right 02/15/2023   Procedure: INTRAMEDULLARY NAILING RIGHT HIP;  Surgeon: Kendal Franky SQUIBB, MD;  Location: [facility] OR;  Service: Orthopedics;  Laterality: Right;   KIDNEY STONE SURGERY      several times   LITHOTRIPSY     LOOP RECORDER INSERTION N/A 07/22/2019   Procedure: LOOP RECORDER INSERTION;  Surgeon: Ammon Blunt, MD;  Location: [facility] INVASIVE CV LAB;  Service: Cardiovascular;  Laterality: N/A;   PARS PLANA VITRECTOMY Left 04/2015   [facility])   [facility] ANGIOPLASTY Left 06/23/2015   Procedure: PATCH ANGIOPLASTY USING 1cm x 4cm BOVINE PERICARDIAL PATCH;  Surgeon: Gaile LELON New, MD;  Location: [facility] OR;  Service: Vascular;  Laterality: Left;   PERCUTANEOUS CORONARY STENT INTERVENTION (PCI-S) N/A 04/21/2012   Procedure: PERCUTANEOUS CORONARY  STENT INTERVENTION (PCI-S);  Surgeon: Lonni JONETTA Cash, MD;  Location: [facility] CATH LAB;  Service: Cardiovascular;  Laterality: N/A;   TEE WITHOUT CARDIOVERSION  12/14/2011   Procedure: TRANSESOPHAGEAL ECHOCARDIOGRAM (TEE);  Surgeon: Ezra GORMAN Shuck, MD;  Location: [facility] ENDOSCOPY;  Service: Cardiovascular;  Laterality: N/A;   US  ECHOCARDIOGRAPHY  2002   normal LV fxn, mild LVH, borderline L atrial size   Family History  Problem Relation Age of Onset   Heart disease Mother        valve problems   Stroke Mother    Hypertension Mother    Heart disease Sister        valve problems   Heart disease Brother    Diabetes Father    Hypertension Father    AAA (abdominal aortic aneurysm) Father    Cancer Neg Hx    Social History   Socioeconomic History   Marital status: Married    Spouse name: Ozell   Number of children: Not on file   Years of education: Not on file   Highest education level: Not on file  Occupational History   Not on file  Tobacco Use   Smoking status: Former    Current packs/day: 0.00    Types: Cigarettes    Quit date: 06/25/1972    Years since quitting: 51.6   Smokeless tobacco: Never   Tobacco comments:    4 years of smoking   Vaping Use   Vaping status: Never Used  Substance and Sexual Activity   Alcohol use: No    Comment: none since 11/2011   Drug use: No   Sexual activity: Yes     Birth control/protection: Post-menopausal  Other Topics Concern   Not on file  Social History Narrative   Caffeine: 2 cups/day coffee   Lives alone, husband in [place] .  4 children nearby, son to live with her.   Occupation: unemployed, prior worked as IT consultant   Activity: stays active on farm   Diet: vegan, no processed foods, good water daily, fruits/vegetables daily   jehovah's witness - no blood products.   Social Drivers of Corporate investment banker Strain: Low Risk  (02/07/2024)   Overall Financial Resource Strain (CARDIA)    Difficulty of Paying Living Expenses: Not hard at all  Food Insecurity: No Food Insecurity (02/07/2024)   Hunger Vital Sign    Worried About Running Out of Food in the Last Year: Never true    Ran Out of Food in the Last Year: Never true  Transportation Needs: No Transportation Needs (02/07/2024)   PRAPARE - Administrator, Civil Service (Medical): No    Lack of Transportation (Non-Medical): No  Physical Activity: Inactive (02/07/2024)   Exercise Vital Sign    Days of Exercise per Week: 0 days    Minutes of Exercise per Session: 0 min  Stress: No Stress Concern Present (02/07/2024)   [employer] of Occupational Health - Occupational Stress Questionnaire    Feeling of Stress: Not at all  Social Connections: Moderately Isolated (02/07/2024)   Social Connection and Isolation Panel    Frequency of Communication with Friends and Family: Once a week    Frequency of Social Gatherings with Friends and Family: Once a week    Attends Religious Services: More than 4 times per year    Active Member of [employer] or Organizations: No    Attends Banker Meetings: Never    Marital Status: Married    Tobacco Counseling Counseling given:  Not Answered Tobacco comments: 4 years of smoking     Clinical Intake:  Pre-visit preparation completed: Yes  Pain : 0-10 Pain Score: 1  Pain Type: Chronic pain Pain Location: Leg Pain Orientation:  Right Pain Descriptors / Indicators: Aching Pain Onset: More than a month ago Pain Frequency: Intermittent Pain Relieving Factors: biofreeze, DMSO gel  Pain Relieving Factors: biofreeze, DMSO gel  BMI - recorded: 24.1 Nutritional Status: BMI of 19-24  Normal Nutritional Risks: None Diabetes: Yes CBG done?: No Did pt. bring in CBG monitor from home?: No  Lab Results  Component Value Date   HGBA1C 6.9 (A) 02/07/2024   HGBA1C 8.4 (H) 10/17/2023   HGBA1C 6.8 (H) 05/08/2023     How often do you need to have someone help you when you read instructions, pamphlets, or other written materials from your doctor or pharmacy?: 1 - Never  Interpreter Needed?: No  Comments: lives with daughter, husband and son Information entered by :: B.Devonne Kitchen,LPN   Activities of Daily Living     02/07/2024    1:22 PM 10/17/2023   11:27 AM  In your present state of health, do you have any difficulty performing the following activities:  Hearing? 0 0  Vision? 0 1  Difficulty concentrating or making decisions? 1 1  Comment  having trouble remembering  Walking or climbing stairs? 1 1  Comment  since hip surgery she does not climb stairs.  Dressing or bathing? 0 0  Doing errands, shopping? 1 1  Preparing Food and eating ? N   Using the Toilet? N   In the past six months, have you accidently leaked urine? Y   Do you have problems with loss of bowel control? N   Managing your Medications? N   Managing your Finances? N   Housekeeping or managing your Housekeeping? N     Patient Care Team: Wendee Lynwood HERO, NP as PCP - General (Nurse Practitioner) End, Lonni, MD as Consulting Physician (Cardiology)  I have updated your Care Teams any recent Medical Services you may have received from other providers in the past year.     Assessment:   This is a routine wellness examination for Kenedee.  Hearing/Vision screen Hearing Screening - Comments:: Pt says her hearing is ok Vision Screening -  Comments:: Pt says her vision is ok w/glasses but no eye exam in a long time Referral un:[facility]    Goals Addressed             This Visit's Progress    Patient Stated       I would like to get better balance and not have to use a cane       Depression Screen     02/07/2024    1:18 PM 02/07/2024   11:36 AM 10/17/2023   12:32 PM  PHQ 2/9 Scores  PHQ - 2 Score 2 3 1   PHQ- 9 Score  6 7    Fall Risk     02/07/2024    1:12 PM 02/07/2024   11:39 AM 02/07/2024   11:38 AM 10/17/2023   12:33 PM 10/17/2023   11:26 AM  Fall Risk   Falls in the past year? 1 1 1 1 1   Number falls in past yr: 0  0 1 1  Injury with Fall? 1  0 1 0  Risk for fall due to : Impaired mobility;Impaired balance/gait History of fall(s);Impaired balance/gait;Impaired mobility;Mental status change History of fall(s);Impaired mobility;Impaired balance/gait Impaired balance/gait;Impaired mobility Impaired  mobility;Impaired balance/gait  Follow up Education provided;Falls prevention discussed Falls evaluation completed Falls evaluation completed Falls evaluation completed Falls evaluation completed    MEDICARE RISK AT HOME:  Medicare Risk at Home Any stairs in or around the home?: Yes If so, are there any without handrails?: Yes Home free of loose throw rugs in walkways, pet beds, electrical cords, etc?: Yes Adequate lighting in your home to reduce risk of falls?: Yes Life alert?: No Use of a cane, walker or w/c?: Yes Grab bars in the bathroom?: No Shower chair or bench in shower?: Yes Elevated toilet seat or a handicapped toilet?: No  TIMED UP AND GO:  Was the test performed?  Yes  Length of time to ambulate 10 feet: 22 sec Gait slow and steady with assistive device  Cognitive Function: 6CIT completed        02/07/2024    1:23 PM  6CIT Screen  What Year? 0 points  What month? 0 points  What time? 0 points  Count back from 20 0 points  Months in reverse 0 points  Repeat phrase 2 points   Total Score 2 points    Immunizations  There is no immunization history on file for this patient.  Screening Tests Health Maintenance  Topic Date Due   FOOT EXAM  Never done   Hepatitis C Screening  Never done   DTaP/Tdap/Td (1 - Tdap) Never done   Pneumococcal Vaccine: 50+ Years (1 of 2 - PCV) Never done   Colonoscopy  Never done   Zoster Vaccines- Shingrix (1 of 2) Never done   MAMMOGRAM  01/13/2014   OPHTHALMOLOGY EXAM  12/11/2014   DEXA SCAN  Never done   COVID-19 Vaccine (1 - 2024-25 season) Never done   INFLUENZA VACCINE  01/24/2024   HEMOGLOBIN A1C  08/09/2024   Diabetic kidney evaluation - eGFR measurement  10/16/2024   Diabetic kidney evaluation - Urine ACR  10/17/2024   Medicare Annual Wellness (AWV)  02/06/2025   HPV VACCINES  Aged Out   Meningococcal B Vaccine  Aged Out    Health Maintenance  Health Maintenance Due  Topic Date Due   FOOT EXAM  Never done   Hepatitis C Screening  Never done   DTaP/Tdap/Td (1 - Tdap) Never done   Pneumococcal Vaccine: 50+ Years (1 of 2 - PCV) Never done   Colonoscopy  Never done   Zoster Vaccines- Shingrix (1 of 2) Never done   MAMMOGRAM  01/13/2014   OPHTHALMOLOGY EXAM  12/11/2014   DEXA SCAN  Never done   COVID-19 Vaccine (1 - 2024-25 season) Never done   INFLUENZA VACCINE  01/24/2024   Health Maintenance Items Addressed: Cologuard Ordered, Referral sent to Optometry/Ophthalmology  Screenings and Vaccinations: pt declines all: Influenza vaccine: recommend every Fall Pneumococcal vaccine: recommend once per lifetime Prevnar-20 Tdap vaccine: recommend every 10 years Shingles vaccine: recommend Shingrix which is 2 doses 2-6 months apart and over 90% effective     Covid-19: recommend 2 doses one month apart with a booster 6 months later  Declines Dexa Scan, MMG and colonoscopy  Additional Screening:  Vision Screening: Recommended annual ophthalmology exams for early detection of glaucoma and other disorders of the  eye. Would you like a referral to an eye doctor? Yes    Dental Screening: Recommended annual dental exams for proper oral hygiene  Community Resource Referral / Chronic Care Management: CRR required this visit?  No   CCM required this visit?  No   Plan:  I have personally reviewed and noted the following in the patient's chart:   Medical and social history Use of alcohol, tobacco or illicit drugs  Current medications and supplements including opioid prescriptions. Patient is not currently taking opioid prescriptions. Functional ability and status Nutritional status Physical activity Advanced directives List of other physicians Hospitalizations, surgeries, and ER visits in previous 12 months Vitals Screenings to include cognitive, depression, and falls Referrals and appointments  In addition, I have reviewed and discussed with patient certain preventive protocols, quality metrics, and best practice recommendations. A written personalized care plan for preventive services as well as general preventive health recommendations were provided to patient.   Erminio LITTIE Saris, LPN   1/84/7974   After Visit Summary: (MyChart) Due to this being a telephonic visit, the after visit summary with patients personalized plan was offered to patient via MyChart   Notes: Nothing significant to report at this time.

## 2024-02-07 NOTE — Patient Instructions (Signed)
 Ms. [name] , Thank you for taking time out of your busy schedule to complete your Annual Wellness Visit with me. I enjoyed our conversation and look forward to speaking with you again next year. I, as well as your care team,  appreciate your ongoing commitment to your health goals. Please review the following plan we discussed and let me know if I can assist you in the future. Your Game plan/ To Do List    Referrals: If you haven't heard from the office you've been referred to, please reach out to them at the phone provided.   Follow up Visits: We will see or speak with you next year for your Next Medicare AWV with our clinical staff-02/10/25 @ 1pm In person Have you seen your provider in the last 6 months (3 months if uncontrolled diabetes)? Yes  Clinician Recommendations:  Aim for 30 minutes of exercise or brisk walking, 6-8 glasses of water, and 5 servings of fruits and vegetables each day.       This is a list of the screenings recommended for you:  Health Maintenance  Topic Date Due   Complete foot exam   Never done   Hepatitis C Screening  Never done   DTaP/Tdap/Td vaccine (1 - Tdap) Never done   Pneumococcal Vaccine for age over 41 (1 of 2 - PCV) Never done   Colon Cancer Screening  Never done   Zoster (Shingles) Vaccine (1 of 2) Never done   Mammogram  01/13/2014   Eye exam for diabetics  12/11/2014   DEXA scan (bone density measurement)  Never done   COVID-19 Vaccine (1 - 2024-25 season) Never done   Flu Shot  01/24/2024   Hemoglobin A1C  08/09/2024   Yearly kidney function blood test for diabetes  10/16/2024   Yearly kidney health urinalysis for diabetes  10/17/2024   Medicare Annual Wellness Visit  02/06/2025   HPV Vaccine  Aged Out   Meningitis B Vaccine  Aged Out    Advanced directives: (Provided) Advance directive discussed with you today. I have provided a copy for you to complete at home and have notarized. Once this is complete, please bring a copy in to our office  so we can scan it into your chart.  Advance Care Planning is important because it:  [x]  Makes sure you receive the medical care that is consistent with your values, goals, and preferences  [x]  It provides guidance to your family and loved ones and reduces their decisional burden about whether or not they are making the right decisions based on your wishes.  Follow the link provided in your after visit summary or read over the paperwork we have mailed to you to help you started getting your Advance Directives in place. If you need assistance in completing these, please reach out to us  so that we can help you!

## 2024-02-07 NOTE — Patient Instructions (Signed)
 NIce to see you today  I will be in touch with the labs once I have them A1C was 6.9! That is fantastic Let me know if you start having any low sugars Follow up with me in 6 months, sooner if you need me

## 2024-02-07 NOTE — Assessment & Plan Note (Signed)
 Currently maintained on 20 units of tresiba  and jardance 10., no hypoglycemia. Pending BMP. A1C well controlled at 6.9% information on S/S of hypoglycemia given at discharge

## 2024-02-07 NOTE — Progress Notes (Signed)
   Established Patient Office Visit  Subjective   Patient ID: [name], female    DOB: 05-04-1956  Age: 68 y.o. MRN: [id number]  Chief Complaint  Patient presents with   Diabetes    HPI  DM2: Patient currently maintained on Jardiance  10 mg daily along with Tresiba  20 units at nighttime. Patients last A1c was 8.4 and we did add on jardiance  10 State that she was staying with her daughter and they would check it daily. State that when PT would do it. She was getting under 100. She is eating 2 meals a day. She will do breakfast and then dinner. She will sometimes snack. She is drinking coffee and water. Occasion soda that is diet.        Review of Systems  Constitutional:  Negative for chills and fever.  Respiratory:  Negative for shortness of breath.   Cardiovascular:  Negative for chest pain.  Gastrointestinal:        Bm a couple times a week   Neurological:  Negative for headaches.  Psychiatric/Behavioral:  Negative for hallucinations and suicidal ideas.       Objective:     BP 112/72   Pulse 90   Temp 98.1 F (36.7 C) (Oral)   Ht 5' 6.5 (1.689 m)   Wt 151 lb 9.6 oz (68.8 kg)   SpO2 98%   BMI 24.10 kg/m    Physical Exam Vitals and nursing note reviewed.  Constitutional:      Appearance: Normal appearance.  Cardiovascular:     Rate and Rhythm: Normal rate and regular rhythm.     Heart sounds: Normal heart sounds.  Pulmonary:     Effort: Pulmonary effort is normal.     Breath sounds: Normal breath sounds.  Neurological:     Mental Status: She is alert.      Results for orders placed or performed in visit on 02/07/24  POCT glycosylated hemoglobin (Hb A1C)  Result Value Ref Range   Hemoglobin A1C 6.9 (A) 4.0 - 5.6 %   HbA1c POC (<> result, manual entry)     HbA1c, POC (prediabetic range)     HbA1c, POC (controlled diabetic range)        The ASCVD Risk score (Arnett DK, et al., 2019) failed to calculate for the following reasons:   Risk  score cannot be calculated because patient has a medical history suggesting prior/existing ASCVD    Assessment & Plan:   Problem List Items Addressed This Visit       Endocrine   Insulin  dependent type 2 diabetes mellitus (HCC) - Primary   Currently maintained on 20 units of tresiba  and jardance 10., no hypoglycemia. Pending BMP. A1C well controlled at 6.9% information on S/S of hypoglycemia given at discharge       Relevant Orders   POCT glycosylated hemoglobin (Hb A1C) (Completed)   Basic metabolic panel with GFR    Return in about 6 months (around 08/09/2024) for DM recheck.    Adina Crandall, NP

## 2024-02-08 ENCOUNTER — Ambulatory Visit: Payer: Self-pay | Admitting: Nurse Practitioner

## 2024-02-13 DIAGNOSIS — E113291 Type 2 diabetes mellitus with mild nonproliferative diabetic retinopathy without macular edema, right eye: Secondary | ICD-10-CM | POA: Diagnosis not present

## 2024-02-13 DIAGNOSIS — H524 Presbyopia: Secondary | ICD-10-CM | POA: Diagnosis not present

## 2024-02-13 DIAGNOSIS — Z961 Presence of intraocular lens: Secondary | ICD-10-CM | POA: Diagnosis not present

## 2024-02-13 LAB — HM DIABETES EYE EXAM

## 2024-02-18 ENCOUNTER — Encounter: Payer: Self-pay | Admitting: Nurse Practitioner

## 2024-02-27 ENCOUNTER — Encounter: Payer: Self-pay | Admitting: *Deleted

## 2024-05-25 ENCOUNTER — Encounter: Payer: Self-pay | Admitting: Nurse Practitioner

## 2024-05-25 ENCOUNTER — Other Ambulatory Visit: Payer: Self-pay | Admitting: Nurse Practitioner

## 2024-05-25 DIAGNOSIS — R296 Repeated falls: Secondary | ICD-10-CM

## 2024-05-25 MED ORDER — TRESIBA FLEXTOUCH 200 UNIT/ML ~~LOC~~ SOPN: 20.0000 [IU] | PEN_INJECTOR | Freq: Every evening | SUBCUTANEOUS | 11 refills | Status: AC

## 2024-05-26 ENCOUNTER — Telehealth: Payer: Self-pay

## 2024-05-26 ENCOUNTER — Other Ambulatory Visit (HOSPITAL_COMMUNITY): Payer: Self-pay

## 2024-05-26 NOTE — Telephone Encounter (Signed)
 See mychart about PA on Tresiba 

## 2024-05-26 NOTE — Telephone Encounter (Signed)
 Pharmacy Patient Advocate Encounter   Received notification from RX Request Messages that prior authorization for Tresiba  200 is required/requested.   Insurance verification completed.   The patient is insured through [facility].   Per test claim: The current 28 day co-pay is, $25.00.  No PA needed at this time. This test claim was processed through [facility]- copay amounts may vary at other pharmacies due to pharmacy/plan contracts, or as the patient moves through the different stages of their insurance plan.     Spoke with [facility]. Filled 05/25/24 ready now for patient pickup, copay is $25.00

## 2024-05-28 NOTE — Addendum Note (Signed)
 Addended by: WENDEE LYNWOOD HERO on: 05/28/2024 04:35 PM   Modules accepted: Orders

## 2024-06-17 ENCOUNTER — Ambulatory Visit (INDEPENDENT_AMBULATORY_CARE_PROVIDER_SITE_OTHER): Admitting: Nurse Practitioner

## 2024-06-17 ENCOUNTER — Ambulatory Visit (INDEPENDENT_AMBULATORY_CARE_PROVIDER_SITE_OTHER)
Admission: RE | Admit: 2024-06-17 | Discharge: 2024-06-17 | Disposition: A | Discharge status: Home / Self Care | Source: Ambulatory Visit | Attending: Nurse Practitioner | Admitting: Nurse Practitioner

## 2024-06-17 VITALS — BP 132/92 | HR 86 | Temp 97.8°F | Ht 66.5 in | Wt 151.6 lb

## 2024-06-17 DIAGNOSIS — M25551 Pain in right hip: Secondary | ICD-10-CM

## 2024-06-17 DIAGNOSIS — R296 Repeated falls: Secondary | ICD-10-CM

## 2024-06-17 DIAGNOSIS — R32 Unspecified urinary incontinence: Secondary | ICD-10-CM | POA: Diagnosis not present

## 2024-06-17 NOTE — Progress Notes (Signed)
 "  Established Patient Office Visit  Subjective   Patient ID: [name], female    DOB: Sep 20, 1955  Age: 68 y.o. MRN: [id number]  Chief Complaint  Patient presents with   Urinary Tract Infection    Pt complains of urinary incontinence. No burning. Pt states that when her feet are cold/standing on something cold she will urinate on herself.      With a history of VBA, MI, HTN, CAD, DM2  Discussed the use of AI scribe software for clinical note transcription with the patient, who gave verbal consent to proceed.  History of Present Illness [name] is a 68 year old female who presents with urinary incontinence and recent fall-related hip pain.  She has been experiencing urinary incontinence for more than a couple of weeks, particularly when consuming fluids. Initially, she was able to make it to the bathroom, but now she often does not make it in time. The incontinence sometimes occurs without the urge to urinate, especially at night when letting her dogs out. She notes that sometimes there is only a 'tiny trickle' and feels like there may be incomplete emptying. There is no burning sensation during urination, no increase in frequency, and no changes in urine color or odor. No lower abdominal pain is reported.  She experienced a fall early last week while attempting to sit down due to dizziness, landing on her right hip. She did not seek medical evaluation at the time. The hip pain is primarily with movement and has improved since the fall. She has a history of a right hip fracture in August of the previous year, which required surgical intervention. She reports having had two falls this year, with additional minor falls, including one in [place]  due to a shower chair without suction cups.  Prior to the fall, she experienced dizziness, which she attributes to not eating for several days following her family members passing. Despite concerns about incontinence, she mentions drinking  fluids at night and acknowledges the importance of staying hydrated. She denies fever, chills, nausea, vomiting, and mid-back pain. She reports back pain across the lower back. She uses a walker inherited from her husband for mobility and balance.    Review of Systems  Constitutional:  Negative for chills and fever.  Respiratory:  Negative for shortness of breath.   Cardiovascular:  Negative for chest pain.  Gastrointestinal:  Negative for abdominal pain and nausea.  Genitourinary:  Negative for dysuria, frequency and hematuria.  Musculoskeletal:  Positive for joint pain.  Neurological:  Positive for dizziness. Negative for headaches.  Psychiatric/Behavioral:  Negative for hallucinations and suicidal ideas.       Objective:     BP (!) 132/92   Pulse 86   Temp 97.8 F (36.6 C) (Oral)   Ht 5' 6.5 (1.689 m)   Wt 151 lb 9.6 oz (68.8 kg)   SpO2 96%   BMI 24.10 kg/m  BP Readings from Last 3 Encounters:  06/17/24 (!) 132/92  02/07/24 112/72  02/07/24 112/72   Wt Readings from Last 3 Encounters:  06/17/24 151 lb 9.6 oz (68.8 kg)  02/07/24 151 lb 14.4 oz (68.9 kg)  02/07/24 151 lb 9.6 oz (68.8 kg)   SpO2 Readings from Last 3 Encounters:  06/17/24 96%  02/07/24 98%  10/17/23 95%      Physical Exam Vitals and nursing note reviewed.  Constitutional:      Appearance: Normal appearance.  Cardiovascular:     Rate and Rhythm: Normal rate  and regular rhythm.     Heart sounds: Normal heart sounds.  Pulmonary:     Effort: Pulmonary effort is normal.     Breath sounds: Rhonchi (cleared with coughing) present.  Abdominal:     General: Bowel sounds are normal. There is no distension.     Tenderness: There is no abdominal tenderness. There is no right CVA tenderness or left CVA tenderness.  Musculoskeletal:        General: Tenderness present.     Lumbar back: Tenderness present. No bony tenderness. Negative right straight leg raise test and negative left straight leg raise test.        Back:     Comments: Right hip pain (inguinal area with AROM) Not pain with PROM  Neurological:     Mental Status: She is alert.     Deep Tendon Reflexes:     Reflex Scores:      Patellar reflexes are 2+ on the right side and 2+ on the left side.    Comments: Breakthrough weakness on the RLE 4/5 LLE 5/5      No results found for any visits on 06/17/24.    The ASCVD Risk score (Arnett DK, et al., 2019) failed to calculate for the following reasons:   Risk score cannot be calculated because patient has a medical history suggesting prior/existing ASCVD   * - Cholesterol units were assumed    Assessment & Plan:   Problem List Items Addressed This Visit       Other   Multiple falls   Relevant Orders   DG Hip Unilat W OR W/O Pelvis 2-3 Views Right   Ambulatory referral to Home Health   Other Visit Diagnoses       Urinary incontinence, unspecified type    -  Primary   Relevant Orders   POCT urinalysis dipstick     Right hip pain       Relevant Orders   DG Hip Unilat W OR W/O Pelvis 2-3 Views Right   Ambulatory referral to Home Health      Assessment and Plan Assessment & Plan Urinary incontinence with possible urinary tract infection Urinary incontinence with urge episodes, possible UTI due to increased incontinence, multifactorial etiology. - Obtained urine sample for urinalysis and culture. - Will treat preemptively for UTI if urinalysis indicates infection. - Encouraged adequate hydration.  Right hip pain after fall Right hip pain post-fall, differential includes strain or possible hairline fracture. - Ordered X-ray of right hip to rule out fracture. - Referred to home health for physical therapy and occupational therapy for balance and gait training.  Recurrent falls and gait instability Recurrent falls with gait instability, previous right hip fracture. - Referred to home health for balance and gait training. - Ensured home environment is free of  trip hazards.   Return if symptoms worsen or fail to improve, for As scheduled.    Adina Crandall, NP  "

## 2024-06-19 ENCOUNTER — Other Ambulatory Visit: Payer: Self-pay

## 2024-06-19 DIAGNOSIS — R32 Unspecified urinary incontinence: Secondary | ICD-10-CM

## 2024-06-22 LAB — URINE CULTURE
MICRO NUMBER:: 17401073
SPECIMEN QUALITY:: ADEQUATE

## 2024-06-22 NOTE — Telephone Encounter (Signed)
 I recently saw the patient and placed a home health referral they are requesting [facility]

## 2024-06-23 ENCOUNTER — Ambulatory Visit: Payer: Self-pay | Admitting: Nurse Practitioner

## 2024-06-23 DIAGNOSIS — N3090 Cystitis, unspecified without hematuria: Secondary | ICD-10-CM

## 2024-06-23 MED ORDER — CEPHALEXIN 500 MG PO CAPS: 500.0000 mg | ORAL_CAPSULE | Freq: Two times a day (BID) | ORAL | 0 refills | Status: AC

## 2024-07-06 ENCOUNTER — Encounter: Payer: Self-pay | Admitting: Nurse Practitioner

## 2024-07-09 ENCOUNTER — Telehealth: Payer: Self-pay | Admitting: Nurse Practitioner

## 2024-07-09 DIAGNOSIS — R296 Repeated falls: Secondary | ICD-10-CM

## 2024-07-09 NOTE — Telephone Encounter (Signed)
 Copied from CRM [id number]. Topic: General - Other >> Jul 09, 2024  2:41 PM Alexandria E wrote: Reason for CRM: [facility] with [facility] calling in to follow up on referral as she has never received one for this patient. Walked through the referral with [facility] under the correct tab and stated that it was changed from palliative to home health. Per patient request, agent located that she was wanting [facility]. Please follow up with daughter to check if [facility] or [facility] is preferred.  [facility] fax number [phone] if needed. Phone number for referral department [phone].

## 2024-07-10 NOTE — Telephone Encounter (Signed)
 Left voicemail for daughter to call the office back.

## 2024-07-14 NOTE — Addendum Note (Signed)
 Addended by: WENDEE LYNWOOD HERO on: 07/14/2024 10:53 AM   Modules accepted: Orders

## 2024-07-14 NOTE — Telephone Encounter (Signed)
"  Left voicemail for patient to call the office back.     "

## 2024-07-14 NOTE — Telephone Encounter (Signed)
New referral placed for home health

## 2024-07-14 NOTE — Telephone Encounter (Signed)
 [name] from [facility] home health needs an order for home health.  Referral placed for pallative care has been closed. Pt prefers home health care.   Will fax order to [phone]

## 2024-07-14 NOTE — Telephone Encounter (Signed)
 Faxed orders to [facility] home [facility].

## 2024-07-14 NOTE — Telephone Encounter (Signed)
 Called and spoke with Elida Maid on dpr.  Pt prefers [facility] for home health.  Will reach out to department with phone number given [phone].

## 2024-08-10 ENCOUNTER — Ambulatory Visit: Admitting: Nurse Practitioner

## 2025-02-09 ENCOUNTER — Ambulatory Visit

## 2025-02-10 ENCOUNTER — Ambulatory Visit
